# Patient Record
Sex: Male | Born: 1955 | Race: White | Hispanic: No | Marital: Married | Smoking: Never smoker
Health system: Southern US, Community
[De-identification: ages and names within clinical notes are randomized; demographics above are authoritative.]

## PROBLEM LIST (undated history)

## (undated) DIAGNOSIS — G4730 Sleep apnea, unspecified: Secondary | ICD-10-CM

## (undated) DIAGNOSIS — M21372 Foot drop, left foot: Secondary | ICD-10-CM

## (undated) DIAGNOSIS — E1142 Type 2 diabetes mellitus with diabetic polyneuropathy: Secondary | ICD-10-CM

## (undated) DIAGNOSIS — N179 Acute kidney failure, unspecified: Secondary | ICD-10-CM

## (undated) DIAGNOSIS — I679 Cerebrovascular disease, unspecified: Secondary | ICD-10-CM

## (undated) DIAGNOSIS — F32A Depression, unspecified: Secondary | ICD-10-CM

## (undated) DIAGNOSIS — G5700 Lesion of sciatic nerve, unspecified lower limb: Secondary | ICD-10-CM

## (undated) DIAGNOSIS — F329 Major depressive disorder, single episode, unspecified: Secondary | ICD-10-CM

## (undated) DIAGNOSIS — Z8719 Personal history of other diseases of the digestive system: Secondary | ICD-10-CM

## (undated) DIAGNOSIS — K219 Gastro-esophageal reflux disease without esophagitis: Secondary | ICD-10-CM

## (undated) DIAGNOSIS — R269 Unspecified abnormalities of gait and mobility: Secondary | ICD-10-CM

## (undated) DIAGNOSIS — E1110 Type 2 diabetes mellitus with ketoacidosis without coma: Secondary | ICD-10-CM

## (undated) DIAGNOSIS — G709 Myoneural disorder, unspecified: Secondary | ICD-10-CM

## (undated) HISTORY — PX: EYE SURGERY: SHX253

## (undated) HISTORY — DX: Type 2 diabetes mellitus with diabetic polyneuropathy: E11.42

## (undated) HISTORY — DX: Lesion of sciatic nerve, unspecified lower limb: G57.00

## (undated) HISTORY — DX: Unspecified abnormalities of gait and mobility: R26.9

## (undated) HISTORY — PX: FRACTURE SURGERY: SHX138

## (undated) HISTORY — DX: Cerebrovascular disease, unspecified: I67.9

## (undated) HISTORY — DX: Foot drop, left foot: M21.372

---

## 1999-08-08 ENCOUNTER — Inpatient Hospital Stay (HOSPITAL_COMMUNITY): Admission: EM | Admit: 1999-08-08 | Discharge: 1999-08-09 | Payer: Self-pay | Admitting: *Deleted

## 2004-06-15 ENCOUNTER — Emergency Department (HOSPITAL_COMMUNITY): Admission: EM | Admit: 2004-06-15 | Discharge: 2004-06-15 | Payer: Self-pay | Admitting: Emergency Medicine

## 2006-03-28 HISTORY — PX: TOE AMPUTATION: SHX809

## 2010-10-13 ENCOUNTER — Emergency Department (INDEPENDENT_AMBULATORY_CARE_PROVIDER_SITE_OTHER): Payer: BC Managed Care – PPO

## 2010-10-13 ENCOUNTER — Emergency Department (HOSPITAL_BASED_OUTPATIENT_CLINIC_OR_DEPARTMENT_OTHER)
Admission: EM | Admit: 2010-10-13 | Discharge: 2010-10-13 | Disposition: A | Discharge status: Home / Self Care | Payer: BC Managed Care – PPO | Attending: Emergency Medicine | Admitting: Emergency Medicine

## 2010-10-13 ENCOUNTER — Encounter: Payer: Self-pay | Admitting: *Deleted

## 2010-10-13 DIAGNOSIS — R7309 Other abnormal glucose: Secondary | ICD-10-CM

## 2010-10-13 DIAGNOSIS — R5381 Other malaise: Secondary | ICD-10-CM | POA: Insufficient documentation

## 2010-10-13 DIAGNOSIS — R1084 Generalized abdominal pain: Secondary | ICD-10-CM | POA: Insufficient documentation

## 2010-10-13 DIAGNOSIS — R109 Unspecified abdominal pain: Secondary | ICD-10-CM

## 2010-10-13 DIAGNOSIS — E162 Hypoglycemia, unspecified: Secondary | ICD-10-CM

## 2010-10-13 DIAGNOSIS — R10819 Abdominal tenderness, unspecified site: Secondary | ICD-10-CM | POA: Insufficient documentation

## 2010-10-13 DIAGNOSIS — R5383 Other fatigue: Secondary | ICD-10-CM

## 2010-10-13 DIAGNOSIS — IMO0001 Reserved for inherently not codable concepts without codable children: Secondary | ICD-10-CM | POA: Insufficient documentation

## 2010-10-13 DIAGNOSIS — I10 Essential (primary) hypertension: Secondary | ICD-10-CM | POA: Insufficient documentation

## 2010-10-13 DIAGNOSIS — J189 Pneumonia, unspecified organism: Secondary | ICD-10-CM

## 2010-10-13 DIAGNOSIS — E119 Type 2 diabetes mellitus without complications: Secondary | ICD-10-CM | POA: Insufficient documentation

## 2010-10-13 DIAGNOSIS — R110 Nausea: Secondary | ICD-10-CM

## 2010-10-13 DIAGNOSIS — R6883 Chills (without fever): Secondary | ICD-10-CM | POA: Insufficient documentation

## 2010-10-13 DIAGNOSIS — R630 Anorexia: Secondary | ICD-10-CM | POA: Insufficient documentation

## 2010-10-13 LAB — URINALYSIS, ROUTINE W REFLEX MICROSCOPIC
Glucose, UA: >1000 mg/dL — AB
Hgb urine dipstick: NEGATIVE
Ketones, ur: NEGATIVE mg/dL
Protein, ur: NEGATIVE mg/dL
pH: 5.5 (ref 5.0–8.0)

## 2010-10-13 LAB — DIFFERENTIAL
Basophils Absolute: 0 10*3/uL (ref 0.0–0.1)
Lymphocytes Relative: 11 % — ABNORMAL LOW (ref 12–46)
Neutro Abs: 11.5 10*3/uL — ABNORMAL HIGH (ref 1.7–7.7)
Neutrophils Relative %: 80 % — ABNORMAL HIGH (ref 43–77)

## 2010-10-13 LAB — COMPREHENSIVE METABOLIC PANEL
ALT: 17 U/L (ref 0–53)
AST: 15 U/L (ref 0–37)
Albumin: 3.7 g/dL (ref 3.5–5.2)
CO2: 27 mEq/L (ref 19–32)
Chloride: 100 mEq/L (ref 96–112)
GFR calc non Af Amer: >60 mL/min (ref >60)
Sodium: 137 mEq/L (ref 135–145)
Total Bilirubin: 0.4 mg/dL (ref 0.3–1.2)

## 2010-10-13 LAB — CBC
Platelets: 206 10*3/uL (ref 150–400)
RDW: 12 % (ref 11.5–15.5)
WBC: 14.4 10*3/uL — ABNORMAL HIGH (ref 4.0–10.5)

## 2010-10-13 LAB — URINE MICROSCOPIC-ADD ON

## 2010-10-13 LAB — GLUCOSE, CAPILLARY
Glucose-Capillary: 202 mg/dL — ABNORMAL HIGH (ref 70–99)
Glucose-Capillary: 172 mg/dL — ABNORMAL HIGH (ref 70–99)

## 2010-10-13 MED ORDER — AZITHROMYCIN 250 MG PO TABS: 250.0000 mg | ORAL_TABLET | Freq: Every day | ORAL | Status: AC

## 2010-10-13 MED ORDER — ONDANSETRON HCL 4 MG/2ML IJ SOLN
4.0000 mg | Freq: Once | INTRAMUSCULAR | Status: AC
  Administered 2010-10-13: 4 mg via INTRAVENOUS
  Filled 2010-10-13: qty 2

## 2010-10-13 MED ORDER — SODIUM CHLORIDE 0.9 % IV SOLN
999.0000 mL | Freq: Once | INTRAVENOUS | Status: AC
  Administered 2010-10-13: 999 mL via INTRAVENOUS

## 2010-10-13 MED ORDER — AZITHROMYCIN 1 G PO PACK
1.0000 g | PACK | Freq: Once | ORAL | Status: AC
  Administered 2010-10-13: 1 g via ORAL

## 2010-10-13 MED ORDER — CEFDINIR 300 MG PO CAPS: 300.0000 mg | ORAL_CAPSULE | Freq: Two times a day (BID) | ORAL | Status: AC

## 2010-10-13 MED ORDER — IOHEXOL 300 MG/ML  SOLN
100.0000 mL | Freq: Once | INTRAMUSCULAR | Status: AC | PRN
  Administered 2010-10-13: 100 mL via INTRAVENOUS

## 2010-10-13 MED ORDER — AZITHROMYCIN 1 G PO PACK
1.0000 g | PACK | Freq: Once | ORAL | Status: DC
  Filled 2010-10-13: qty 1

## 2010-10-13 MED ORDER — DEXTROSE 5 % IV SOLN: 500.0000 mg | Freq: Once | INTRAVENOUS | Status: DC

## 2010-10-13 MED ORDER — DEXTROSE 5 % IV SOLN
1.0000 g | INTRAVENOUS | Status: DC
  Administered 2010-10-13: 1 g via INTRAVENOUS
  Filled 2010-10-13: qty 1

## 2010-10-13 NOTE — ED Notes (Signed)
Pt reports that this AM pt was hyperglycemic and took his prescribed medication, pt then reports that he developed ABD pain with nausea and dizziness. Pt denies any V/D.

## 2010-10-13 NOTE — ED Notes (Signed)
Pt reports that he took 16 Units of humolog and 55 Units of Lantus for his CBG, pt was sent from PCP for evaluation of hyperglycemia and abd pain.

## 2010-10-13 NOTE — ED Notes (Signed)
Pt Transported to CT at this time. Pt states nausea has improved. IVF complete. Site SL. Dry/intact. Family at bedside.

## 2010-10-13 NOTE — ED Provider Notes (Signed)
History     Chief Complaint  Patient presents with  . Hyperglycemia  . Fatigue  . Abdominal Pain  . Nausea   HPI  Past Medical History  Diagnosis Date  . Diabetes mellitus   . Hypertension     Past Surgical History  Procedure Date  . Toe amputation     History reviewed. No pertinent family history.  History  Substance Use Topics  . Smoking status: Never Smoker   . Smokeless tobacco: Not on file  . Alcohol Use: Not on file      Review of Systems  Physical Exam  BP 131/68  Pulse 87  Temp(Src) 98.7 F (37.1 C) (Oral)  Resp 17  SpO2 99%  Physical Exam  ED Course  Procedures  MDM Pt c/o mid abd pain, constant, dull, no hx same pain, dec apppetite. No fever or chills. No prior abd surgery. Tenderness on exam without rebound or guarding. Ct pending.       Suzi Roots, MD 10/13/10 [phone]

## 2010-10-13 NOTE — ED Provider Notes (Signed)
History     Chief Complaint  Patient presents with  . Hyperglycemia  . Fatigue  . Abdominal Pain  . Nausea   Patient is a 55 y.o. male presenting with abdominal pain. The history is provided by the patient.  Abdominal Pain The primary symptoms of the illness include abdominal pain. The current episode started 13 to 24 hours ago. The onset of the illness was gradual.  The abdominal pain began 13 to24 hours ago. The pain came on gradually. The abdominal pain has been gradually worsening since its onset. The abdominal pain is generalized. The abdominal pain does not radiate. The abdominal pain is relieved by nothing.  The patient has had a change in bowel habit. Additional symptoms associated with the illness include chills and anorexia. Significant associated medical issues include diabetes.    Past Medical History  Diagnosis Date  . Diabetes mellitus   . Hypertension     Past Surgical History  Procedure Date  . Toe amputation     History reviewed. No pertinent family history.  History  Substance Use Topics  . Smoking status: Never Smoker   . Smokeless tobacco: Not on file  . Alcohol Use: Not on file      Review of Systems  Constitutional: Positive for chills.  Gastrointestinal: Positive for abdominal pain and anorexia.  Musculoskeletal: Positive for myalgias.  All other systems reviewed and are negative.    Physical Exam  BP 131/68  Pulse 87  Temp(Src) 98.7 F (37.1 C) (Oral)  Resp 17  SpO2 99%  Physical Exam  Constitutional: He appears well-developed and well-nourished.  HENT:  Head: Normocephalic.  Neck: Normal range of motion. Neck supple.  Cardiovascular: Normal rate.   Pulmonary/Chest: Effort normal and breath sounds normal.  Abdominal: Soft. There is tenderness. There is guarding.  Musculoskeletal: Normal range of motion.  Skin: Skin is warm.  Psychiatric: He has a normal mood and affect.    ED Course  Procedures  MDM Labs show elevated  WBC's.  Dr.Steinl in to see,  Ct scan of abdomen shows areas that may be pneumonia.  Radiologist advised Chest xray,  Chest xray shows pneumonia,  I will treat with Rocephin and Zithromax.  Pt to see his Md for recheck tommorow.      Langston Masker, [place] 10/13/10 2151

## 2010-12-10 ENCOUNTER — Emergency Department (HOSPITAL_BASED_OUTPATIENT_CLINIC_OR_DEPARTMENT_OTHER)
Admission: EM | Admit: 2010-12-10 | Discharge: 2010-12-10 | Discharge status: Against Medical Advice | Payer: BC Managed Care – PPO | Attending: Emergency Medicine | Admitting: Emergency Medicine

## 2010-12-10 ENCOUNTER — Encounter (HOSPITAL_BASED_OUTPATIENT_CLINIC_OR_DEPARTMENT_OTHER): Payer: Self-pay | Admitting: *Deleted

## 2010-12-10 DIAGNOSIS — L0291 Cutaneous abscess, unspecified: Secondary | ICD-10-CM | POA: Insufficient documentation

## 2010-12-10 NOTE — ED Notes (Signed)
Abscess right upper leg x 1 week. States he drained an oz of puss from it last night and woke with it red, hot to touch and painful.

## 2011-12-05 DIAGNOSIS — E669 Obesity, unspecified: Secondary | ICD-10-CM | POA: Diagnosis present

## 2012-08-18 ENCOUNTER — Emergency Department (HOSPITAL_COMMUNITY): Payer: BC Managed Care – PPO

## 2012-08-18 ENCOUNTER — Inpatient Hospital Stay (HOSPITAL_COMMUNITY): Payer: BC Managed Care – PPO

## 2012-08-18 ENCOUNTER — Inpatient Hospital Stay (HOSPITAL_COMMUNITY)
Admission: EM | Admit: 2012-08-18 | Discharge: 2012-08-29 | DRG: 566 | Disposition: A | Discharge status: Rehabilitation Facility | Payer: BC Managed Care – PPO | Attending: Internal Medicine | Admitting: Internal Medicine

## 2012-08-18 DIAGNOSIS — I10 Essential (primary) hypertension: Secondary | ICD-10-CM | POA: Diagnosis present

## 2012-08-18 DIAGNOSIS — E1169 Type 2 diabetes mellitus with other specified complication: Principal | ICD-10-CM | POA: Diagnosis present

## 2012-08-18 DIAGNOSIS — J9600 Acute respiratory failure, unspecified whether with hypoxia or hypercapnia: Secondary | ICD-10-CM | POA: Diagnosis present

## 2012-08-18 DIAGNOSIS — G9340 Encephalopathy, unspecified: Secondary | ICD-10-CM | POA: Diagnosis present

## 2012-08-18 DIAGNOSIS — R1110 Vomiting, unspecified: Secondary | ICD-10-CM | POA: Diagnosis not present

## 2012-08-18 DIAGNOSIS — K219 Gastro-esophageal reflux disease without esophagitis: Secondary | ICD-10-CM | POA: Diagnosis present

## 2012-08-18 DIAGNOSIS — Z91199 Patient's noncompliance with other medical treatment and regimen due to unspecified reason: Secondary | ICD-10-CM

## 2012-08-18 DIAGNOSIS — F329 Major depressive disorder, single episode, unspecified: Secondary | ICD-10-CM | POA: Diagnosis present

## 2012-08-18 DIAGNOSIS — E119 Type 2 diabetes mellitus without complications: Secondary | ICD-10-CM

## 2012-08-18 DIAGNOSIS — E1159 Type 2 diabetes mellitus with other circulatory complications: Secondary | ICD-10-CM | POA: Diagnosis present

## 2012-08-18 DIAGNOSIS — E871 Hypo-osmolality and hyponatremia: Secondary | ICD-10-CM | POA: Diagnosis not present

## 2012-08-18 DIAGNOSIS — I798 Other disorders of arteries, arterioles and capillaries in diseases classified elsewhere: Secondary | ICD-10-CM | POA: Diagnosis present

## 2012-08-18 DIAGNOSIS — L97509 Non-pressure chronic ulcer of other part of unspecified foot with unspecified severity: Secondary | ICD-10-CM | POA: Diagnosis present

## 2012-08-18 DIAGNOSIS — L97911 Non-pressure chronic ulcer of unspecified part of right lower leg limited to breakdown of skin: Secondary | ICD-10-CM

## 2012-08-18 DIAGNOSIS — F3289 Other specified depressive episodes: Secondary | ICD-10-CM | POA: Diagnosis present

## 2012-08-18 DIAGNOSIS — Z794 Long term (current) use of insulin: Secondary | ICD-10-CM

## 2012-08-18 DIAGNOSIS — I6381 Other cerebral infarction due to occlusion or stenosis of small artery: Secondary | ICD-10-CM | POA: Diagnosis present

## 2012-08-18 DIAGNOSIS — Z9119 Patient's noncompliance with other medical treatment and regimen: Secondary | ICD-10-CM

## 2012-08-18 DIAGNOSIS — R569 Unspecified convulsions: Secondary | ICD-10-CM | POA: Insufficient documentation

## 2012-08-18 DIAGNOSIS — E1151 Type 2 diabetes mellitus with diabetic peripheral angiopathy without gangrene: Secondary | ICD-10-CM | POA: Diagnosis present

## 2012-08-18 DIAGNOSIS — E11649 Type 2 diabetes mellitus with hypoglycemia without coma: Secondary | ICD-10-CM | POA: Diagnosis present

## 2012-08-18 DIAGNOSIS — R401 Stupor: Secondary | ICD-10-CM | POA: Insufficient documentation

## 2012-08-18 DIAGNOSIS — J9690 Respiratory failure, unspecified, unspecified whether with hypoxia or hypercapnia: Secondary | ICD-10-CM

## 2012-08-18 DIAGNOSIS — E162 Hypoglycemia, unspecified: Secondary | ICD-10-CM

## 2012-08-18 DIAGNOSIS — R404 Transient alteration of awareness: Secondary | ICD-10-CM

## 2012-08-18 DIAGNOSIS — I739 Peripheral vascular disease, unspecified: Secondary | ICD-10-CM | POA: Insufficient documentation

## 2012-08-18 DIAGNOSIS — F411 Generalized anxiety disorder: Secondary | ICD-10-CM | POA: Diagnosis present

## 2012-08-18 DIAGNOSIS — F332 Major depressive disorder, recurrent severe without psychotic features: Secondary | ICD-10-CM

## 2012-08-18 DIAGNOSIS — G589 Mononeuropathy, unspecified: Secondary | ICD-10-CM | POA: Diagnosis present

## 2012-08-18 DIAGNOSIS — E669 Obesity, unspecified: Secondary | ICD-10-CM | POA: Diagnosis present

## 2012-08-18 DIAGNOSIS — L02619 Cutaneous abscess of unspecified foot: Secondary | ICD-10-CM | POA: Diagnosis present

## 2012-08-18 DIAGNOSIS — R1310 Dysphagia, unspecified: Secondary | ICD-10-CM | POA: Diagnosis present

## 2012-08-18 DIAGNOSIS — I639 Cerebral infarction, unspecified: Secondary | ICD-10-CM

## 2012-08-18 DIAGNOSIS — R45851 Suicidal ideations: Secondary | ICD-10-CM

## 2012-08-18 DIAGNOSIS — R339 Retention of urine, unspecified: Secondary | ICD-10-CM | POA: Diagnosis not present

## 2012-08-18 DIAGNOSIS — R4182 Altered mental status, unspecified: Secondary | ICD-10-CM

## 2012-08-18 DIAGNOSIS — G4730 Sleep apnea, unspecified: Secondary | ICD-10-CM | POA: Diagnosis present

## 2012-08-18 HISTORY — DX: Personal history of other diseases of the digestive system: Z87.19

## 2012-08-18 HISTORY — DX: Major depressive disorder, single episode, unspecified: F32.9

## 2012-08-18 HISTORY — DX: Depression, unspecified: F32.A

## 2012-08-18 HISTORY — DX: Myoneural disorder, unspecified: G70.9

## 2012-08-18 HISTORY — DX: Gastro-esophageal reflux disease without esophagitis: K21.9

## 2012-08-18 HISTORY — DX: Sleep apnea, unspecified: G47.30

## 2012-08-18 LAB — COMPREHENSIVE METABOLIC PANEL
Albumin: 3.7 g/dL (ref 3.5–5.2)
BUN: 22 mg/dL (ref 6–23)
BUN: 19 mg/dL (ref 6–23)
Calcium: 9.1 mg/dL (ref 8.4–10.5)
Creatinine, Ser: 0.66 mg/dL (ref 0.50–1.35)
Creatinine, Ser: 0.62 mg/dL (ref 0.50–1.35)
GFR calc Af Amer: >90 mL/min (ref >90)
Glucose, Bld: 128 mg/dL — ABNORMAL HIGH (ref 70–99)
Potassium: 6.2 mEq/L — ABNORMAL HIGH (ref 3.5–5.1)
Total Protein: 7.8 g/dL (ref 6.0–8.3)
Total Protein: 6.3 g/dL (ref 6.0–8.3)

## 2012-08-18 LAB — CBC WITH DIFFERENTIAL/PLATELET
Basophils Relative: 0 % (ref 0–1)
Eosinophils Absolute: 0 10*3/uL (ref 0.0–0.7)
Hemoglobin: 13.5 g/dL (ref 13.0–17.0)
MCH: 32.6 pg (ref 26.0–34.0)
MCHC: 34.2 g/dL (ref 30.0–36.0)
Monocytes Relative: 8 % (ref 3–12)
Neutrophils Relative %: 90 % — ABNORMAL HIGH (ref 43–77)

## 2012-08-18 LAB — GLUCOSE, CAPILLARY
Glucose-Capillary: 136 mg/dL — ABNORMAL HIGH (ref 70–99)
Glucose-Capillary: <10 mg/dL — CL (ref 70–99)
Glucose-Capillary: 68 mg/dL — ABNORMAL LOW (ref 70–99)
Glucose-Capillary: 67 mg/dL — ABNORMAL LOW (ref 70–99)
Glucose-Capillary: 70 mg/dL (ref 70–99)
Glucose-Capillary: 92 mg/dL (ref 70–99)
Glucose-Capillary: 80 mg/dL (ref 70–99)
Glucose-Capillary: 76 mg/dL (ref 70–99)

## 2012-08-18 LAB — TROPONIN I: Troponin I: <0.3 ng/mL (ref <0.30)

## 2012-08-18 LAB — URINALYSIS, ROUTINE W REFLEX MICROSCOPIC
Leukocytes, UA: NEGATIVE
Nitrite: NEGATIVE
Specific Gravity, Urine: 1.012 (ref 1.005–1.030)
pH: 6.5 (ref 5.0–8.0)

## 2012-08-18 LAB — STREP PNEUMONIAE URINARY ANTIGEN: Strep Pneumo Urinary Antigen: NEGATIVE

## 2012-08-18 LAB — POCT I-STAT 3, ART BLOOD GAS (G3+)
Bicarbonate: 25 mEq/L — ABNORMAL HIGH (ref 20.0–24.0)
O2 Saturation: 96 %
Patient temperature: 98.7
TCO2: 27 mmol/L (ref 0–100)
pH, Arterial: 7.147 — CL (ref 7.350–7.450)
pO2, Arterial: 139 mmHg — ABNORMAL HIGH (ref 80.0–100.0)
pO2, Arterial: 87 mmHg (ref 80.0–100.0)

## 2012-08-18 LAB — ETHANOL: Alcohol, Ethyl (B): <11 mg/dL (ref 0–11)

## 2012-08-18 LAB — ACETAMINOPHEN LEVEL: Acetaminophen (Tylenol), Serum: <15 ug/mL (ref 10–30)

## 2012-08-18 LAB — LACTIC ACID, PLASMA: Lactic Acid, Venous: 1 mmol/L (ref 0.5–2.2)

## 2012-08-18 LAB — MRSA PCR SCREENING: MRSA by PCR: NEGATIVE

## 2012-08-18 LAB — URINE MICROSCOPIC-ADD ON

## 2012-08-18 LAB — PROCALCITONIN: Procalcitonin: <0.1 ng/mL

## 2012-08-18 LAB — SALICYLATE LEVEL: Salicylate Lvl: <2 mg/dL — ABNORMAL LOW (ref 2.8–20.0)

## 2012-08-18 LAB — PROTIME-INR
Prothrombin Time: 11.8 seconds (ref 11.6–15.2)
Prothrombin Time: 12 seconds (ref 11.6–15.2)

## 2012-08-18 LAB — CBC
MCH: 32.1 pg (ref 26.0–34.0)
MCHC: 34.7 g/dL (ref 30.0–36.0)
Platelets: 227 10*3/uL (ref 150–400)

## 2012-08-18 LAB — MAGNESIUM: Magnesium: 1.8 mg/dL (ref 1.5–2.5)

## 2012-08-18 MED ORDER — CEFAZOLIN SODIUM 1-5 GM-% IV SOLN
1.0000 g | Freq: Three times a day (TID) | INTRAVENOUS | Status: DC
  Administered 2012-08-18: 1 g via INTRAVENOUS
  Filled 2012-08-18 (×3): qty 50

## 2012-08-18 MED ORDER — LORAZEPAM 2 MG/ML IJ SOLN
1.0000 mg | Freq: Once | INTRAMUSCULAR | Status: AC
  Administered 2012-08-18: 1 mg via INTRAVENOUS

## 2012-08-18 MED ORDER — FENTANYL CITRATE 0.05 MG/ML IJ SOLN
100.0000 ug | Freq: Once | INTRAMUSCULAR | Status: AC
  Administered 2012-08-18: 100 ug via INTRAVENOUS

## 2012-08-18 MED ORDER — SODIUM CHLORIDE 0.9 % IV SOLN
40.0000 ug/h | INTRAVENOUS | Status: DC
  Filled 2012-08-18: qty 50

## 2012-08-18 MED ORDER — ROCURONIUM BROMIDE 50 MG/5ML IV SOLN
INTRAVENOUS | Status: AC
  Filled 2012-08-18: qty 2

## 2012-08-18 MED ORDER — SODIUM PHOSPHATE 3 MMOLE/ML IV SOLN
10.0000 mmol | Freq: Once | INTRAVENOUS | Status: DC
  Filled 2012-08-18: qty 3.33

## 2012-08-18 MED ORDER — CHLORHEXIDINE GLUCONATE 0.12 % MT SOLN
15.0000 mL | Freq: Two times a day (BID) | OROMUCOSAL | Status: DC
  Administered 2012-08-18 – 2012-08-21 (×6): 15 mL via OROMUCOSAL
  Filled 2012-08-18 (×6): qty 15

## 2012-08-18 MED ORDER — LIDOCAINE HCL (CARDIAC) 20 MG/ML IV SOLN
INTRAVENOUS | Status: AC
  Filled 2012-08-18: qty 5

## 2012-08-18 MED ORDER — PANTOPRAZOLE SODIUM 40 MG IV SOLR
40.0000 mg | Freq: Every day | INTRAVENOUS | Status: DC
  Administered 2012-08-18 – 2012-08-24 (×7): 40 mg via INTRAVENOUS
  Filled 2012-08-18 (×10): qty 40

## 2012-08-18 MED ORDER — DEXTROSE 50 % IV SOLN
INTRAVENOUS | Status: AC
  Administered 2012-08-18: 50 mL
  Filled 2012-08-18: qty 50

## 2012-08-18 MED ORDER — MIDAZOLAM HCL 2 MG/2ML IJ SOLN
5.0000 mg | Freq: Once | INTRAMUSCULAR | Status: AC
  Administered 2012-08-18: 5 mg via INTRAVENOUS

## 2012-08-18 MED ORDER — SUCCINYLCHOLINE CHLORIDE 20 MG/ML IJ SOLN
INTRAMUSCULAR | Status: AC
  Administered 2012-08-18: 100 mg
  Filled 2012-08-18: qty 1

## 2012-08-18 MED ORDER — SODIUM CHLORIDE 0.9 % IJ SOLN
10.0000 mL | Freq: Two times a day (BID) | INTRAMUSCULAR | Status: DC
  Administered 2012-08-19 – 2012-08-21 (×5): 10 mL
  Administered 2012-08-22: 20 mL

## 2012-08-18 MED ORDER — BIOTENE DRY MOUTH MT LIQD
15.0000 mL | Freq: Four times a day (QID) | OROMUCOSAL | Status: DC
  Administered 2012-08-19 – 2012-08-21 (×12): 15 mL via OROMUCOSAL

## 2012-08-18 MED ORDER — DEXTROSE 10 % IV SOLN
INTRAVENOUS | Status: DC
  Administered 2012-08-18 – 2012-08-19 (×5): via INTRAVENOUS

## 2012-08-18 MED ORDER — DEXTROSE 50 % IV SOLN
50.0000 mL | INTRAVENOUS | Status: DC | PRN
  Administered 2012-08-18 – 2012-08-19 (×4): 50 mL via INTRAVENOUS
  Filled 2012-08-18 (×4): qty 50

## 2012-08-18 MED ORDER — FENTANYL CITRATE 0.05 MG/ML IJ SOLN
INTRAMUSCULAR | Status: AC
  Filled 2012-08-18: qty 2

## 2012-08-18 MED ORDER — PROPOFOL 10 MG/ML IV EMUL
5.0000 ug/kg/min | INTRAVENOUS | Status: DC
  Administered 2012-08-18: 10 ug/kg/min via INTRAVENOUS
  Administered 2012-08-19: 15 ug/kg/min via INTRAVENOUS
  Administered 2012-08-19: 20 ug/kg/min via INTRAVENOUS
  Filled 2012-08-18 (×3): qty 100

## 2012-08-18 MED ORDER — SODIUM CHLORIDE 0.9 % IJ SOLN
10.0000 mL | INTRAMUSCULAR | Status: DC | PRN
  Administered 2012-08-24: 10 mL

## 2012-08-18 MED ORDER — MIDAZOLAM HCL 2 MG/2ML IJ SOLN
INTRAMUSCULAR | Status: AC
  Administered 2012-08-18: 5 mg via INTRAVENOUS
  Filled 2012-08-18: qty 6

## 2012-08-18 MED ORDER — ETOMIDATE 2 MG/ML IV SOLN
INTRAVENOUS | Status: AC
  Administered 2012-08-18: 20 mg
  Filled 2012-08-18: qty 20

## 2012-08-18 MED ORDER — MIDAZOLAM HCL 2 MG/2ML IJ SOLN
INTRAMUSCULAR | Status: AC
  Filled 2012-08-18: qty 6

## 2012-08-18 MED ORDER — HEPARIN SODIUM (PORCINE) 5000 UNIT/ML IJ SOLN
5000.0000 [IU] | Freq: Three times a day (TID) | INTRAMUSCULAR | Status: DC
  Administered 2012-08-18 – 2012-08-29 (×33): 5000 [IU] via SUBCUTANEOUS
  Filled 2012-08-18 (×36): qty 1

## 2012-08-18 MED ORDER — PROPOFOL 10 MG/ML IV EMUL
INTRAVENOUS | Status: AC
  Filled 2012-08-18: qty 100

## 2012-08-18 MED ORDER — PROPOFOL 10 MG/ML IV EMUL
5.0000 ug/kg/min | INTRAVENOUS | Status: DC
  Administered 2012-08-18: 40 ug/kg/min via INTRAVENOUS

## 2012-08-18 MED ORDER — PIPERACILLIN-TAZOBACTAM 3.375 G IVPB
3.3750 g | Freq: Three times a day (TID) | INTRAVENOUS | Status: DC
  Administered 2012-08-18 – 2012-08-21 (×9): 3.375 g via INTRAVENOUS
  Filled 2012-08-18 (×10): qty 50

## 2012-08-18 MED ORDER — SODIUM GLYCEROPHOSPHATE 1 MMOLE/ML IV SOLN
10.0000 mmol | Freq: Once | INTRAVENOUS | Status: AC
  Administered 2012-08-18: 10 mmol via INTRAVENOUS
  Filled 2012-08-18: qty 10

## 2012-08-18 MED ORDER — DEXTROSE 50 % IV SOLN
INTRAVENOUS | Status: AC
  Administered 2012-08-18: 16:00:00
  Filled 2012-08-18: qty 50

## 2012-08-18 NOTE — Progress Notes (Signed)
1. CT head with 2 areas concerning for R thalamus infarcts, age indeterminate;   Neurology called to evaluate.   2. Antibiotics changed to Zosyn for aspiration PNA coverage   3. DVT prophylaxis with Heparin ordered.

## 2012-08-18 NOTE — ED Notes (Signed)
Temporary name deleted from pyxis per pharmacy. Ativan 1 mg wasted in sink with Diona Foley, RN

## 2012-08-18 NOTE — H&P (Signed)
PULMONARY  / CRITICAL CARE MEDICINE  Name: GEAN LAROSE MRN: [id number] DOB: January 02, 1956    ADMISSION DATE:  08/18/2012    REFERRING MD : EDP PRIMARY SERVICE: PCCM  CHIEF COMPLAINT:  VDRF  BRIEF PATIENT DESCRIPTION:  57 yo Wm found unresponsive in his care. Found to be profoundly hypoglycemic and unresponsive. NT intubation in the field and D50 boluses administered and transported to [facility] ED. There is a  Question of seizure like activity and CT of the head is planned. ET placed by EDP. Hypoglycemia has been treated.   He has a Hx of HTN and PVD secondary to DM. He has a space boot on rt ankle and a rt foot silver dollar wound has purulent drainage and has obviously been treated by [facility] type facility. PCCM has been asked to admit. We will check CT head and may call Neuro consult if tonic clonic activity continues once metabolic disarray has been corrected.  SIGNIFICANT EVENTS / STUDIES:  5-24 hypogl  LINES / TUBES: 5-24 OTT(EDP)>> 5-24 Lt i j cvl>>  CULTURES: 5-24 rt foot>>  ANTIBIOTICS: 5-24 ancef>>  HISTORY OF PRESENT ILLNESS:  57 yo Wm found unresponsive in his care. Found to be profoundly hypoglycemic and unresponsive. NT intubation in the field and D50 boluses administered and transported to [facility] ED. There is a  Question of seizure like activity and CT of the head is planned. ET placed by EDP. Hypoglycemia has been treated.   He has a Hx of HTN and PVD secondary to DM. He has a space boot on rt ankle and a rt foot silver dollar wound has purulent drainage and has obviously been treated by [facility] type facility. PCCM has been asked to admit. We will check CT head and may call Neuro consult if tonic clonic activity continues once metabolic disarray has been corrected.  PAST MEDICAL HISTORY :  Past Medical History  Diagnosis Date  . Diabetes mellitus   . Hypertension    Past Surgical History  Procedure Laterality Date  . Toe amputation     Prior to Admission medications    Medication Sig Start Date End Date Taking? Authorizing Provider  cetirizine (ZYRTEC) 10 MG chewable tablet Chew 10 mg by mouth daily.      Historical Provider, MD  clotrimazole-betamethasone (LOTRISONE) cream Apply topically 2 (two) times daily.     Historical Provider, MD  Ferrous Sulfate (IRON) 325 (65 FE) MG TABS Take 1 tablet by mouth daily.     Historical Provider, MD  guaiFENesin (MUCINEX) 600 MG 12 hr tablet Take 600 mg by mouth 2 (two) times daily.      Historical Provider, MD  insulin glargine (LANTUS) 100 UNIT/ML injection Inject 55 Units into the skin daily.     Historical Provider, MD  insulin lispro (HUMALOG) 100 UNIT/ML injection Inject 8-18 Units into the skin 3 (three) times daily before meals. Use sliding scale provided by doctor    Historical Provider, MD  lisinopril (PRINIVIL,ZESTRIL) 10 MG tablet Take 5 mg by mouth daily.     Historical Provider, MD  Multiple Vitamins-Iron (MULTIVITAMIN/IRON) TABS Take 1 tablet by mouth daily.      Historical Provider, MD  mupirocin (BACTROBAN) 2 % cream Apply topically 2 (two) times daily.     Historical Provider, MD  omeprazole (PRILOSEC) 20 MG capsule Take 20 mg by mouth daily.      Historical Provider, MD  pramlintide (SYMLIN) 1000 MCG/ML injection Inject 120 mcg into the skin 3 (three) times daily  before meals.      Historical Provider, MD  Probiotic Product (ALIGN) 4 MG CAPS Take 1 capsule by mouth daily.     Historical Provider, MD   Allergies  Allergen Reactions  . Latex Swelling  . Sulfa Antibiotics Hives    FAMILY HISTORY:  No family history on file. SOCIAL HISTORY:  reports that he has never smoked. He does not have any smokeless tobacco history on file. He reports that he does not use illicit drugs. His alcohol history is not on file.  REVIEW OF SYSTEMS:  na  SUBJECTIVE:  Pt unresponsive on vent VITAL SIGNS: Pulse Rate:  [110-118] 110 (05/24 1030) Resp:  [20-26] 26 (05/24 1030) BP: (173-221)/(86-127) 221/86 mmHg  (05/24 1030) SpO2:  [99 %-100 %] 100 % (05/24 1030) FiO2 (%):  [100 %] 100 % (05/24 1035) HEMODYNAMICS:   VENTILATOR SETTINGS: Vent Mode:  [-] PRVC FiO2 (%):  [100 %] 100 % Set Rate:  [14 bmp-24 bmp] 24 bmp Vt Set:  [650 mL] 650 mL PEEP:  [5 cmH20] 5 cmH20 INTAKE / OUTPUT: Intake/Output   None     PHYSICAL EXAMINATION: General: sedated on diprivan. Obese. Neuro:  Rt arm tonic type movement when not sedated HEENT:  Ott-> vent Cardiovascular: hsr rrr Lungs:  Coarse rhonchi bilat Abdomen:  Obese +bs Musculoskeletal:  Multiple missing toes. Rt foot ulcer with purulent drainage Skin:  warm  LABS:  Recent Labs Lab 08/18/12 1047  PHART 7.147*  PCO2ART 72.4*  PO2ART 139.0*   No results found for this basename: GLUCAP,  in the last 168 hours  Imaging: Dg Chest Port 1 View  08/18/2012   *RADIOLOGY REPORT*  Clinical Data: Status post intubation.  PORTABLE CHEST - 1 VIEW  Comparison: 10/14/2010  Findings: Heart size is accentuated by the portable AP supine technique.  Endotracheal tube is in place with tip estimated to be approximately 6.7 cm above carina.  Lungs are clear.  IMPRESSION:  1.  Interval placement of endotracheal tube, approximately 6.7 cm above carina. 2.  Lungs are clear.   Original Report Authenticated By: Norva Pavlov, M.D.     ASSESSMENT / PLAN:  PULMONARY A:VDRF secondary to ams and hypoglycemia.  P:   -Wean when stable  CARDIOVASCULAR A: HTN P:  Check CE for completeness Treat HTN as needed  RENAL Lab Results  Component Value Date   CREATININE 1.10 10/13/2010    A:  No acute issue P:     GASTROINTESTINAL A:  GI Protection P:   PPI Tube feeds within 24 hours  HEMATOLOGIC A:  No acute issue P:  Check INR for completeness  INFECTIOUS A:  Cellulitis rt lower foot P:   Culture foot Abx as needed Change foot dressing  ENDOCRINE A:  Hypoglycemia(? Insulin OD)) DM  P:   Hourly CBG till glucose >100 x 6 hours D50 as needed D10  at 50 till glucose stablized  NEUROLOGIC A:  Ams, ? Seizure, hypolycemic  P:   Correct metabolic disorder Ct head ?MRI  TODAY'S SUMMARY:  57 yo Wm found unresponsive in his care. Found to be profoundly hypoglycemic and unresponsive. NT intubation in the field and D50 boluses administered and transported to [facility] ED. There is a  Question of seizure like activity and CT of the head is planned. ET placed by EDP. Hypoglycemia has been treated.   He has a Hx of HTN and PVD secondary to DM. He has a space boot on rt ankle and a rt foot silver  dollar wound has purulent drainage and has obviously been treated by [facility] type facility. PCCM has been asked to admit. We will check CT head and may call Neuro consult if tonic clonic activity continues once metabolic disarray has been corrected.   Dorcas Carrow Beeper  [phone]  Cell  [phone]  If no response or cell goes to voicemail, call beeper [phone]   08/18/2012, 11:21 AM

## 2012-08-18 NOTE — ED Notes (Signed)
Pt presents to ED via EMS. Pt was found in car unresponsive, vomit in car, agonal respirations per EMS. EMS gave 4mg  narcan, D10 X 2, intubated via right nare, OGT placed with coffee ground emesis noted. Marland Kitchen Posturing noted with EMS and on arrival to ED.  No response noted with narcan.

## 2012-08-18 NOTE — Progress Notes (Signed)
Utilization review completed.  P.J. Roshad Hack,RN,BSN Case Manager [phone]  

## 2012-08-18 NOTE — Consult Note (Signed)
NEURO HOSPITALIST CONSULT NOTE    Reason for Consult: altered mental status, abnormal CT brain.  HPI:                                                                                                                                          Thomas Frye is an 57 y.o. male, left handed, with a past medical history significant for hypertension, DM, brought to [facility] after being found unresponsive and profoundly hypoglycemic by medics in parked car. Unknown last time seen normal. He was intubated in the field, received D50% boluses, and was transferred to [facility]. There is also report of possible seizure activity. CT brain revealed areas of low attenuation right thalamus and left parietal area. He is persistently hypoglycemic despite treatment. Currently intubated on the vent, on propofol.    Past Medical History  Diagnosis Date  . Diabetes mellitus   . Hypertension     Past Surgical History  Procedure Laterality Date  . Toe amputation      No family history on file.   Social History:  reports that he has never smoked. He does not have any smokeless tobacco history on file. He reports that he does not use illicit drugs. His alcohol history is not on file.  Allergies  Allergen Reactions  . Latex Swelling  . Sulfa Antibiotics Hives    MEDICATIONS:                                                                                                                     I have reviewed the patient's current medications.   ROS:  Unable to obtain due to patient's mental status.  History obtained from chart review and family.   Physical exam: intubated on the vent, restless despite propofol. Blood pressure 116/51, pulse 98, temperature 97.9 F (36.6 C), temperature source Other (Comment), resp. rate 22, height 6\' 4"  (1.93 m), weight 131 kg  (288 lb 12.8 oz), SpO2 100.00%.  Head: normocephalic. Neck: supple, no bruits, no JVD. Cardiac: no murmurs. Lungs: clear. Abdomen: soft, no tender, no mass. Extremities: big right toe amputated.    Neurologic Examination:                                                                                                      Propofol turned off for this exam. Mental Status: comatose.  Cranial Nerves: Pupils 2-3 mm bilaterally, poorly reactive. No gaze preference. Doesn't blink to threat. EOM appear to be present. No face asymmetry. Tongue: intubated. Motor: Moves upper extremities symmetrically, perhaps right greater than left. Sensory: doesn't react to pain. Deep Tendon Reflexes: 1 + throughout Plantars: Right: amputated big toe    Left: downgoing Cerebellar: Unable to test Gait:  Unable to test. CV: pulses palpable throughout    No results found for this basename: cbc, bmp, coags, chol, tri, ldl, hga1c    Results for orders placed during the hospital encounter of 08/18/12 (from the past 48 hour(s))  GLUCOSE, CAPILLARY     Status: None   Collection Time    08/18/12 10:15 AM      Result Value Range   Glucose-Capillary 80  70 - 99 mg/dL  CBC WITH DIFFERENTIAL     Status: Abnormal   Collection Time    08/18/12 10:24 AM      Result Value Range   WBC 20.9 (*) 4.0 - 10.5 K/uL   RBC 4.14 (*) 4.22 - 5.81 MIL/uL   Hemoglobin 13.5  13.0 - 17.0 g/dL   HCT 16.1  09.6 - 04.5 %   MCV 95.4  78.0 - 100.0 fL   MCH 32.6  26.0 - 34.0 pg   MCHC 34.2  30.0 - 36.0 g/dL   RDW 40.9  81.1 - 91.4 %   Platelets 260  150 - 400 K/uL   Neutrophils Relative % 90 (*) 43 - 77 %   Neutro Abs 18.8 (*) 1.7 - 7.7 K/uL   Lymphocytes Relative 2 (*) 12 - 46 %   Lymphs Abs 0.5 (*) 0.7 - 4.0 K/uL   Monocytes Relative 8  3 - 12 %   Monocytes Absolute 1.6 (*) 0.1 - 1.0 K/uL   Eosinophils Relative 0  0 - 5 %   Eosinophils Absolute 0.0  0.0 - 0.7 K/uL   Basophils Relative 0  0 - 1 %   Basophils Absolute  0.0  0.0 - 0.1 K/uL  COMPREHENSIVE METABOLIC PANEL     Status: Abnormal   Collection Time    08/18/12 10:24 AM      Result Value Range   Sodium 134 (*) 135 - 145 mEq/L   Potassium 6.2 (*) 3.5 - 5.1 mEq/L   Chloride 97  96 - 112 mEq/L   CO2 26  19 - 32 mEq/L   Glucose, Bld 142 (*) 70 - 99 mg/dL   BUN 22  6 - 23 mg/dL   Creatinine, Ser 1.61  0.50 - 1.35 mg/dL   Calcium 9.7  8.4 - 09.6 mg/dL   Total Protein 7.8  6.0 - 8.3 g/dL   Albumin 3.7  3.5 - 5.2 g/dL   AST 36  0 - 37 U/L   ALT 19  0 - 53 U/L   Alkaline Phosphatase 68  39 - 117 U/L   Total Bilirubin 0.3  0.3 - 1.2 mg/dL   GFR calc non Af Amer >90  >90 mL/min   GFR calc Af Amer >90  >90 mL/min   Comment:            The eGFR has been calculated     using the CKD EPI equation.     This calculation has not been     validated in all clinical     situations.     eGFR's persistently     <90 mL/min signify     possible Chronic Kidney Disease.  ACETAMINOPHEN LEVEL     Status: None   Collection Time    08/18/12 10:24 AM      Result Value Range   Acetaminophen (Tylenol), Serum <15.0  10 - 30 ug/mL   Comment:            THERAPEUTIC CONCENTRATIONS VARY     SIGNIFICANTLY. A RANGE OF 10-30     ug/mL MAY BE AN EFFECTIVE     CONCENTRATION FOR MANY PATIENTS.     HOWEVER, SOME ARE BEST TREATED     AT CONCENTRATIONS OUTSIDE THIS     RANGE.     ACETAMINOPHEN CONCENTRATIONS     >150 ug/mL AT 4 HOURS AFTER     INGESTION AND >50 ug/mL AT 12     HOURS AFTER INGESTION ARE     OFTEN ASSOCIATED WITH TOXIC     REACTIONS.  SALICYLATE LEVEL     Status: Abnormal   Collection Time    08/18/12 10:24 AM      Result Value Range   Salicylate Lvl <2.0 (*) 2.8 - 20.0 mg/dL  ETHANOL     Status: None   Collection Time    08/18/12 10:24 AM      Result Value Range   Alcohol, Ethyl (B) <11  0 - 11 mg/dL   Comment:            LOWEST DETECTABLE LIMIT FOR     SERUM ALCOHOL IS 11 mg/dL     FOR MEDICAL PURPOSES ONLY  TROPONIN I     Status: None    Collection Time    08/18/12 10:36 AM      Result Value Range   Troponin I <0.30  <0.30 ng/mL   Comment:            Due to the release kinetics of cTnI,     a negative result within the first hours     of the onset of symptoms does not rule out     myocardial infarction with certainty.     If myocardial infarction is still suspected,     repeat the test at appropriate intervals.  AMMONIA     Status: None   Collection Time    08/18/12 10:37 AM      Result Value Range   Ammonia 36  11 - 60 umol/L  POCT I-STAT 3, BLOOD GAS (  G3+)     Status: Abnormal   Collection Time    08/18/12 10:47 AM      Result Value Range   pH, Arterial 7.147 (*) 7.350 - 7.450   pCO2 arterial 72.4 (*) 35.0 - 45.0 mmHg   pO2, Arterial 139.0 (*) 80.0 - 100.0 mmHg   Bicarbonate 25.0 (*) 20.0 - 24.0 mEq/L   TCO2 27  0 - 100 mmol/L   O2 Saturation 98.0     Acid-base deficit 5.0 (*) 0.0 - 2.0 mmol/L   Collection site RADIAL, ALLEN'S TEST ACCEPTABLE     Drawn by RT     Sample type ARTERIAL     Comment NOTIFIED PHYSICIAN    GLUCOSE, CAPILLARY     Status: Abnormal   Collection Time    08/18/12 10:52 AM      Result Value Range   Glucose-Capillary 69 (*) 70 - 99 mg/dL  PROTIME-INR     Status: None   Collection Time    08/18/12 10:59 AM      Result Value Range   Prothrombin Time 11.8  11.6 - 15.2 seconds   INR 0.87  0.00 - 1.49  URINALYSIS, ROUTINE W REFLEX MICROSCOPIC     Status: Abnormal   Collection Time    08/18/12 11:11 AM      Result Value Range   Color, Urine YELLOW  YELLOW   APPearance CLEAR  CLEAR   Specific Gravity, Urine 1.012  1.005 - 1.030   pH 6.5  5.0 - 8.0   Glucose, UA 100 (*) NEGATIVE mg/dL   Hgb urine dipstick TRACE (*) NEGATIVE   Bilirubin Urine NEGATIVE  NEGATIVE   Ketones, ur NEGATIVE  NEGATIVE mg/dL   Protein, ur NEGATIVE  NEGATIVE mg/dL   Urobilinogen, UA 0.2  0.0 - 1.0 mg/dL   Nitrite NEGATIVE  NEGATIVE   Leukocytes, UA NEGATIVE  NEGATIVE  URINE MICROSCOPIC-ADD ON     Status:  Abnormal   Collection Time    08/18/12 11:11 AM      Result Value Range   Squamous Epithelial / LPF RARE  RARE   RBC / HPF 0-2  <3 RBC/hpf   Casts HYALINE CASTS (*) NEGATIVE  URINE RAPID DRUG SCREEN (HOSP PERFORMED)     Status: None   Collection Time    08/18/12 11:12 AM      Result Value Range   Opiates NONE DETECTED  NONE DETECTED   Cocaine NONE DETECTED  NONE DETECTED   Benzodiazepines NONE DETECTED  NONE DETECTED   Amphetamines NONE DETECTED  NONE DETECTED   Tetrahydrocannabinol NONE DETECTED  NONE DETECTED   Barbiturates NONE DETECTED  NONE DETECTED   Comment:            DRUG SCREEN FOR MEDICAL PURPOSES     ONLY.  IF CONFIRMATION IS NEEDED     FOR ANY PURPOSE, NOTIFY LAB     WITHIN 5 DAYS.                LOWEST DETECTABLE LIMITS     FOR URINE DRUG SCREEN     Drug Class       Cutoff (ng/mL)     Amphetamine      1000     Barbiturate      200     Benzodiazepine   200     Tricyclics       300     Opiates          300  Cocaine          300     THC              50  LACTIC ACID, PLASMA     Status: None   Collection Time    08/18/12 12:06 PM      Result Value Range   Lactic Acid, Venous 1.0  0.5 - 2.2 mmol/L  PROCALCITONIN     Status: None   Collection Time    08/18/12 12:06 PM      Result Value Range   Procalcitonin <0.10     Comment:            Interpretation:     PCT (Procalcitonin) <= 0.5 ng/mL:     Systemic infection (sepsis) is not likely.     Local bacterial infection is possible.     (NOTE)             ICU PCT Algorithm               Non ICU PCT Algorithm        ----------------------------     ------------------------------             PCT < 0.25 ng/mL                 PCT < 0.1 ng/mL         Stopping of antibiotics            Stopping of antibiotics           strongly encouraged.               strongly encouraged.        ----------------------------     ------------------------------           PCT level decrease by               PCT < 0.25 ng/mL            >= 80% from peak PCT           OR PCT 0.25 - 0.5 ng/mL          Stopping of antibiotics                                                 encouraged.         Stopping of antibiotics               encouraged.        ----------------------------     ------------------------------           PCT level decrease by              PCT >= 0.25 ng/mL           < 80% from peak PCT            AND PCT >= 0.5 ng/mL            Continuing antibiotics                                                  encouraged.           Continuing antibiotics  encouraged.        ----------------------------     ------------------------------         PCT level increase compared          PCT > 0.5 ng/mL             with peak PCT AND              PCT >= 0.5 ng/mL             Escalation of antibiotics                                              strongly encouraged.          Escalation of antibiotics            strongly encouraged.  POCT I-STAT 3, BLOOD GAS (G3+)     Status: Abnormal   Collection Time    08/18/12 12:27 PM      Result Value Range   pH, Arterial 7.379  7.350 - 7.450   pCO2 arterial 43.7  35.0 - 45.0 mmHg   pO2, Arterial 87.0  80.0 - 100.0 mmHg   Bicarbonate 25.8 (*) 20.0 - 24.0 mEq/L   TCO2 27  0 - 100 mmol/L   O2 Saturation 96.0     Patient temperature 98.7 F     Collection site RADIAL, ALLEN'S TEST ACCEPTABLE     Drawn by Operator     Sample type ARTERIAL    GLUCOSE, CAPILLARY     Status: Abnormal   Collection Time    08/18/12  1:48 PM      Result Value Range   Glucose-Capillary <10 (*) 70 - 99 mg/dL  GLUCOSE, CAPILLARY     Status: Abnormal   Collection Time    08/18/12  1:50 PM      Result Value Range   Glucose-Capillary <10 (*) 70 - 99 mg/dL  GLUCOSE, CAPILLARY     Status: Abnormal   Collection Time    08/18/12  1:56 PM      Result Value Range   Glucose-Capillary 136 (*) 70 - 99 mg/dL  COMPREHENSIVE METABOLIC PANEL     Status: Abnormal   Collection Time    08/18/12  2:00  PM      Result Value Range   Sodium 134 (*) 135 - 145 mEq/L   Potassium 3.3 (*) 3.5 - 5.1 mEq/L   Chloride 100  96 - 112 mEq/L   CO2 26  19 - 32 mEq/L   Glucose, Bld 128 (*) 70 - 99 mg/dL   BUN 19  6 - 23 mg/dL   Creatinine, Ser 4.54  0.50 - 1.35 mg/dL   Calcium 9.1  8.4 - 09.8 mg/dL   Total Protein 6.3  6.0 - 8.3 g/dL   Albumin 3.1 (*) 3.5 - 5.2 g/dL   AST 44 (*) 0 - 37 U/L   ALT 17  0 - 53 U/L   Alkaline Phosphatase 54  39 - 117 U/L   Total Bilirubin 0.4  0.3 - 1.2 mg/dL   GFR calc non Af Amer >90  >90 mL/min   GFR calc Af Amer >90  >90 mL/min   Comment:            The eGFR has been calculated     using the CKD EPI equation.  This calculation has not been     validated in all clinical     situations.     eGFR's persistently     <90 mL/min signify     possible Chronic Kidney Disease.  MAGNESIUM     Status: None   Collection Time    08/18/12  2:00 PM      Result Value Range   Magnesium 1.8  1.5 - 2.5 mg/dL  PHOSPHORUS     Status: Abnormal   Collection Time    08/18/12  2:00 PM      Result Value Range   Phosphorus 0.7 (*) 2.3 - 4.6 mg/dL   Comment: CRITICAL RESULT CALLED TO, READ BACK BY AND VERIFIED WITH:     MACAFERO,A RN 08/18/12 1505 WOOTEN,K  CBC     Status: Abnormal   Collection Time    08/18/12  2:00 PM      Result Value Range   WBC 15.5 (*) 4.0 - 10.5 K/uL   RBC 3.61 (*) 4.22 - 5.81 MIL/uL   Hemoglobin 11.6 (*) 13.0 - 17.0 g/dL   HCT 82.9 (*) 56.2 - 13.0 %   MCV 92.5  78.0 - 100.0 fL   MCH 32.1  26.0 - 34.0 pg   MCHC 34.7  30.0 - 36.0 g/dL   RDW 86.5  78.4 - 69.6 %   Platelets 227  150 - 400 K/uL  PROTIME-INR     Status: None   Collection Time    08/18/12  2:00 PM      Result Value Range   Prothrombin Time 12.0  11.6 - 15.2 seconds   INR 0.89  0.00 - 1.49  APTT     Status: None   Collection Time    08/18/12  2:00 PM      Result Value Range   aPTT 29  24 - 37 seconds  MRSA PCR SCREENING     Status: None   Collection Time    08/18/12  2:08 PM       Result Value Range   MRSA by PCR NEGATIVE  NEGATIVE   Comment:            The GeneXpert MRSA Assay (FDA     approved for NASAL specimens     only), is one component of a     comprehensive MRSA colonization     surveillance program. It is not     intended to diagnose MRSA     infection nor to guide or     monitor treatment for     MRSA infections.  GLUCOSE, CAPILLARY     Status: Abnormal   Collection Time    08/18/12  2:21 PM      Result Value Range   Glucose-Capillary 55 (*) 70 - 99 mg/dL  GLUCOSE, CAPILLARY     Status: Abnormal   Collection Time    08/18/12  2:49 PM      Result Value Range   Glucose-Capillary 104 (*) 70 - 99 mg/dL  GLUCOSE, CAPILLARY     Status: Abnormal   Collection Time    08/18/12  3:11 PM      Result Value Range   Glucose-Capillary 68 (*) 70 - 99 mg/dL  GLUCOSE, CAPILLARY     Status: Abnormal   Collection Time    08/18/12  4:46 PM      Result Value Range   Glucose-Capillary 67 (*) 70 - 99 mg/dL  GLUCOSE, CAPILLARY     Status: Abnormal  Collection Time    08/18/12  5:25 PM      Result Value Range   Glucose-Capillary 113 (*) 70 - 99 mg/dL    Ct Head Wo Contrast  08/18/2012   *RADIOLOGY REPORT*  Clinical Data: Altered mental status.  Unresponsive behind the steering wheel.  Ventilator.  CT HEAD WITHOUT CONTRAST  Technique:  Contiguous axial images were obtained from the base of the skull through the vertex without contrast.  Comparison: None.  Findings: There are 2 focal areas of low attenuation within the right thalamus, consistent with subacute or chronic infarcts. Within the left parietal lobe, there is focal white matter change, atypical for acute infarct.  This may represent an area of chronic infarct.  However, further evaluation with MRI with contrast is recommended.  No evidence for intracranial hemorrhage.  Bone windows show chronic sinusitis involving the frontal, ethmoid, and maxillary sinuses.  The patient has an endotracheal tube in place.   IMPRESSION:  1.  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 2.  Focal low attenuation within the left parietal lobe. 3.  Further evaluation with MRI of the brain with contrast is recommended.  The findings were discussed with Dr. Ranae Palms on 08/18/2012 at 1:24 p.m.   Original Report Authenticated By: Norva Pavlov, M.D.   Dg Chest Portable 1 View  08/18/2012   *RADIOLOGY REPORT*  Clinical Data: Altered mental status.  PORTABLE CHEST - 1 VIEW  Comparison: 08/18/2012  Findings: Endotracheal tube is in place, tip proximally 3.7 cm above carina.  Nasogastric tube is in place, tip overlying the level of the stomach.  Heart is enlarged.  There is mild pulmonary vascular congestion.  No focal consolidations or pleural effusions are identified.  IMPRESSION:  1.  Interval placement nasogastric tube. 2.  Mild pulmonary vascular congestion.   Original Report Authenticated By: Norva Pavlov, M.D.   Dg Chest Port 1 View  08/18/2012   *RADIOLOGY REPORT*  Clinical Data: Status post intubation.  PORTABLE CHEST - 1 VIEW  Comparison: 10/14/2010  Findings: Heart size is accentuated by the portable AP supine technique.  Endotracheal tube is in place with tip estimated to be approximately 6.7 cm above carina.  Lungs are clear.  IMPRESSION:  1.  Interval placement of endotracheal tube, approximately 6.7 cm above carina. 2.  Lungs are clear.   Original Report Authenticated By: Norva Pavlov, M.D.     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 Neuro-imaging abnormalities are not unusual in the context of hypoglycemic encephalopathy and they are not necessarily ischemic.   Recommend: 1) MRI-DWI to better assess previously identified CT findings.  2) EEG.  3) Will follow up.  Wyatt Portela, MD [facility] [phone]  08/18/2012, 6:00 PM

## 2012-08-18 NOTE — ED Provider Notes (Signed)
History     CSN: [id number]  Arrival date & time 08/18/12  1013   First MD Initiated Contact with Patient 08/18/12 1025      Chief Complaint  Patient presents with  . Altered Mental Status    (Consider location/radiation/quality/duration/timing/severity/associated sxs/prior treatment) HPI Pt found unresponsive in parked car this AM by EMS. Unknown last time seen normal. CBG 15 and 2 amps D50 given. Agonal respirations with upper ext posturing/tonicity. Nasal intubation by EMS. Level 5 caveat.  Past Medical History  Diagnosis Date  . Diabetes mellitus   . Hypertension     Past Surgical History  Procedure Laterality Date  . Toe amputation      No family history on file.  History  Substance Use Topics  . Smoking status: Never Smoker   . Smokeless tobacco: Not on file  . Alcohol Use: Not on file      Review of Systems  Unable to perform ROS: Intubated    Allergies  Latex and Sulfa antibiotics  Home Medications   Current Outpatient Rx  Name  Route  Sig  Dispense  Refill  . cetirizine (ZYRTEC) 10 MG chewable tablet   Oral   Chew 10 mg by mouth daily.           . clotrimazole-betamethasone (LOTRISONE) cream   Topical   Apply topically 2 (two) times daily.          . Ferrous Sulfate (IRON) 325 (65 FE) MG TABS   Oral   Take 1 tablet by mouth daily.          Marland Kitchen guaiFENesin (MUCINEX) 600 MG 12 hr tablet   Oral   Take 600 mg by mouth 2 (two) times daily.           . insulin glargine (LANTUS) 100 UNIT/ML injection   Subcutaneous   Inject 55 Units into the skin daily.          . insulin lispro (HUMALOG) 100 UNIT/ML injection   Subcutaneous   Inject 8-18 Units into the skin 3 (three) times daily before meals. Use sliding scale provided by doctor         . lisinopril (PRINIVIL,ZESTRIL) 10 MG tablet   Oral   Take 5 mg by mouth daily.          . Multiple Vitamins-Iron (MULTIVITAMIN/IRON) TABS   Oral   Take 1 tablet by mouth daily.            . mupirocin (BACTROBAN) 2 % cream   Topical   Apply topically 2 (two) times daily.          Marland Kitchen omeprazole (PRILOSEC) 20 MG capsule   Oral   Take 20 mg by mouth daily.           . pramlintide (SYMLIN) 1000 MCG/ML injection   Subcutaneous   Inject 120 mcg into the skin 3 (three) times daily before meals.           . Probiotic Product (ALIGN) 4 MG CAPS   Oral   Take 1 capsule by mouth daily.            BP 138/72  Pulse 110  Resp 25  SpO2 98%  Physical Exam  Nursing note and vitals reviewed. Constitutional: He appears well-developed and well-nourished. No distress.  HENT:  Head: Normocephalic and atraumatic.  Mouth/Throat: Oropharynx is clear and moist.  Nasal ETT  Eyes:  Pin point pupils.   Neck: Normal range of motion. Neck supple.  Cardiovascular: Regular rhythm.   tachycardia  Pulmonary/Chest: Effort normal and breath sounds normal. No respiratory distress. He has no wheezes. He has no rales. He exhibits no tenderness.  Abdominal: Soft. Bowel sounds are normal. He exhibits distension. He exhibits no mass. There is no tenderness. There is no rebound and no guarding.  Musculoskeletal: He exhibits no edema and no tenderness.  Tonic bl upper ext.   Neurological:  Not following commands or localizing to pain. Raising bl upper ext in tonic type motion  Skin: Skin is dry. No rash noted. No erythema.  Cool skin    ED Course  INTUBATION Date/Time: 08/18/2012 12:15 PM Performed by: Loren Racer Authorized by: Ranae Palms, Cindy Brindisi Consent: The procedure was performed in an emergent situation. Indications: airway protection and respiratory failure Intubation method: video-assisted Patient status: paralyzed (RSI) Sedatives: etomidate Paralytic: succinylcholine Tube size: 7.5 mm Tube type: cuffed Number of attempts: 1 Cricoid pressure: no Cords visualized: yes Post-procedure assessment: chest rise and CO2 detector Breath sounds: equal Cuff inflated: yes ETT  to lip: 23 cm Tube secured with: ETT holder Chest x-ray interpreted by me and radiologist. Chest x-ray findings: endotracheal tube too high Tube repositioned: tube repositioned successfully Patient tolerance: Patient tolerated the procedure well with no immediate complications.   (including critical care time)  Labs Reviewed  CBC WITH DIFFERENTIAL - Abnormal; Notable for the following:    WBC 20.9 (*)    RBC 4.14 (*)    Neutrophils Relative % 90 (*)    Neutro Abs 18.8 (*)    Lymphocytes Relative 2 (*)    Lymphs Abs 0.5 (*)    Monocytes Absolute 1.6 (*)    All other components within normal limits  COMPREHENSIVE METABOLIC PANEL - Abnormal; Notable for the following:    Sodium 134 (*)    Potassium 6.2 (*)    Glucose, Bld 142 (*)    All other components within normal limits  URINALYSIS, ROUTINE W REFLEX MICROSCOPIC - Abnormal; Notable for the following:    Glucose, UA 100 (*)    Hgb urine dipstick TRACE (*)    All other components within normal limits  SALICYLATE LEVEL - Abnormal; Notable for the following:    Salicylate Lvl <2.0 (*)    All other components within normal limits  URINE MICROSCOPIC-ADD ON - Abnormal; Notable for the following:    Casts HYALINE CASTS (*)    All other components within normal limits  POCT I-STAT 3, BLOOD GAS (G3+) - Abnormal; Notable for the following:    pH, Arterial 7.147 (*)    pCO2 arterial 72.4 (*)    pO2, Arterial 139.0 (*)    Bicarbonate 25.0 (*)    Acid-base deficit 5.0 (*)    All other components within normal limits  POCT I-STAT 3, BLOOD GAS (G3+) - Abnormal; Notable for the following:    Bicarbonate 25.8 (*)    All other components within normal limits  CULTURE, BLOOD (ROUTINE X 2)  CULTURE, BLOOD (ROUTINE X 2)  URINE CULTURE  CULTURE, RESPIRATORY (NON-EXPECTORATED)  TROPONIN I  AMMONIA  ACETAMINOPHEN LEVEL  URINE RAPID DRUG SCREEN (HOSP PERFORMED)  ETHANOL  PROTIME-INR  COMPREHENSIVE METABOLIC PANEL  MAGNESIUM  PHOSPHORUS   LACTIC ACID, PLASMA  PROCALCITONIN  CORTISOL  CBC  PROTIME-INR  APTT  STREP PNEUMONIAE URINARY ANTIGEN  LEGIONELLA ANTIGEN, URINE  BLOOD GAS, ARTERIAL   Dg Chest Portable 1 View  08/18/2012   *RADIOLOGY REPORT*  Clinical Data: Altered mental status.  PORTABLE CHEST - 1 VIEW  Comparison: 08/18/2012  Findings: Endotracheal tube is in place, tip proximally 3.7 cm above carina.  Nasogastric tube is in place, tip overlying the level of the stomach.  Heart is enlarged.  There is mild pulmonary vascular congestion.  No focal consolidations or pleural effusions are identified.  IMPRESSION:  1.  Interval placement nasogastric tube. 2.  Mild pulmonary vascular congestion.   Original Report Authenticated By: Norva Pavlov, M.D.   Dg Chest Port 1 View  08/18/2012   *RADIOLOGY REPORT*  Clinical Data: Status post intubation.  PORTABLE CHEST - 1 VIEW  Comparison: 10/14/2010  Findings: Heart size is accentuated by the portable AP supine technique.  Endotracheal tube is in place with tip estimated to be approximately 6.7 cm above carina.  Lungs are clear.  IMPRESSION:  1.  Interval placement of endotracheal tube, approximately 6.7 cm above carina. 2.  Lungs are clear.   Original Report Authenticated By: Norva Pavlov, M.D.     1. Respiratory failure   2. Altered mental status   3. DM (diabetes mellitus)   4. Hypoglycemia   5. Obtunded     CRITICAL CARE Performed by: Ranae Palms, Tiaira Arambula Total critical care time: 45 min  Critical care time was exclusive of separately billable procedures and treating other patients. Critical care was necessary to treat or prevent imminent or life-threatening deterioration. Critical care was time spent personally by me on the following activities: development of treatment plan with patient and/or surrogate as well as nursing, discussions with consultants, evaluation of patient's response to treatment, examination of patient, obtaining history from patient or surrogate,  ordering and performing treatments and interventions, ordering and review of laboratory studies, ordering and review of radiographic studies, pulse oximetry and re-evaluation of patient's condition.    MDM  Discussed with Critical Care who now is at bedside and assumes care for pt.         Loren Racer, MD 08/18/12 1250

## 2012-08-18 NOTE — ED Notes (Signed)
Triple lumen central line inserted.

## 2012-08-18 NOTE — Progress Notes (Signed)
Tracheal aspirate sputum sample collected via ETT.  Sample sent to lab.

## 2012-08-18 NOTE — Progress Notes (Signed)
1. Stroke   Neurology note reviewed. MRI/MRA head ordered.   2. Hypophosphatemia   Ph replaced  3. Hypoglycemia   D10 in creased to 200 cc/h.

## 2012-08-18 NOTE — Progress Notes (Signed)
Patient's minister Thomas Frye has possession of patient's car. His number is [phone]

## 2012-08-18 NOTE — Progress Notes (Signed)
ANTIBIOTIC CONSULT NOTE - INITIAL  Pharmacy Consult for zosyn Indication: rule out pneumonia  Allergies  Allergen Reactions  . Latex Swelling  . Sulfa Antibiotics Hives    Patient Measurements: Height: 6\' 4"  (193 cm) Weight: 288 lb 12.8 oz (131 kg) IBW/kg (Calculated) : 86.8  Vital Signs: Temp: 97.9 F (36.6 C) (05/24 1436) Temp src: Other (Comment) (05/24 1436) BP: 116/51 mmHg (05/24 1436) Pulse Rate: 98 (05/24 1436) Intake/Output from previous day:   Intake/Output from this shift: Total I/O In: 135 [I.V.:25; NG/GT:60; IV Piggyback:50] Out: 625 [Urine:625]  Labs:  Recent Labs  08/18/12 1024 08/18/12 1400  WBC 20.9* 15.5*  HGB 13.5 11.6*  PLT 260 227  CREATININE 0.66 0.62   Estimated Creatinine Clearance: 156 ml/min (by C-G formula based on Cr of 0.62). No results found for this basename: VANCOTROUGH, Leodis Binet, VANCORANDOM, GENTTROUGH, GENTPEAK, GENTRANDOM, TOBRATROUGH, TOBRAPEAK, TOBRARND, AMIKACINPEAK, AMIKACINTROU, AMIKACIN,  in the last 72 hours   Microbiology: Recent Results (from the past 720 hour(s))  MRSA PCR SCREENING     Status: None   Collection Time    08/18/12  2:08 PM      Result Value Range Status   MRSA by PCR NEGATIVE  NEGATIVE Final   Comment:            The GeneXpert MRSA Assay (FDA     approved for NASAL specimens     only), is one component of a     comprehensive MRSA colonization     surveillance program. It is not     intended to diagnose MRSA     infection nor to guide or     monitor treatment for     MRSA infections.    Medical History: Past Medical History  Diagnosis Date  . Diabetes mellitus   . Hypertension     Medications:  Anti-infectives   Start     Dose/Rate Route Frequency Ordered Stop   08/18/12 1800  piperacillin-tazobactam (ZOSYN) IVPB 3.375 g     3.375 g 12.5 mL/hr over 240 Minutes Intravenous Every 8 hours 08/18/12 1713     08/18/12 1400  ceFAZolin (ANCEF) IVPB 1 g/50 mL premix  Status:  Discontinued      1 g 100 mL/hr over 30 Minutes Intravenous 3 times per day 08/18/12 1215 08/18/12 1711     Assessment: 56 yom presented to the ED  After being found unresponsive. To start empiric zosyn for possible aspiration pneumonia. Afebrile and WBC is elevated at 15.5. Good renal function.  Goal of Therapy:  Eradication of infection  Plan:  1. Zosyn 3.375gm IV Q8H (4 hr inf) 2. F/u renal fxn, C&S, clinical status   Farmer Mccahill, Drake Leach 08/18/2012,5:13 PM

## 2012-08-18 NOTE — ED Notes (Signed)
Ativan wasted with hayley ringley rn, was taken out under temp name and temp name was deleted from pyxis.refer to her note also

## 2012-08-18 NOTE — ED Notes (Signed)
CCM at bedside 

## 2012-08-19 ENCOUNTER — Inpatient Hospital Stay (HOSPITAL_COMMUNITY): Payer: BC Managed Care – PPO

## 2012-08-19 ENCOUNTER — Encounter (HOSPITAL_COMMUNITY): Payer: Self-pay

## 2012-08-19 DIAGNOSIS — L97911 Non-pressure chronic ulcer of unspecified part of right lower leg limited to breakdown of skin: Secondary | ICD-10-CM | POA: Diagnosis present

## 2012-08-19 LAB — BLOOD GAS, ARTERIAL
Drawn by: 34779
MECHVT: 650 mL
PEEP: 5 cmH2O
Patient temperature: 99.2
RATE: 24 resp/min
TCO2: 25.7 mmol/L (ref 0–100)
pCO2 arterial: 28.3 mmHg — ABNORMAL LOW (ref 35.0–45.0)
pH, Arterial: 7.554 — ABNORMAL HIGH (ref 7.350–7.450)

## 2012-08-19 LAB — LEGIONELLA ANTIGEN, URINE: Legionella Antigen, Urine: NEGATIVE

## 2012-08-19 LAB — CBC
HCT: 31.8 % — ABNORMAL LOW (ref 39.0–52.0)
Hemoglobin: 11.1 g/dL — ABNORMAL LOW (ref 13.0–17.0)
MCV: 92.4 fL (ref 78.0–100.0)
RDW: 13.1 % (ref 11.5–15.5)
WBC: 11.1 10*3/uL — ABNORMAL HIGH (ref 4.0–10.5)

## 2012-08-19 LAB — PHOSPHORUS: Phosphorus: 2.7 mg/dL (ref 2.3–4.6)

## 2012-08-19 LAB — MAGNESIUM: Magnesium: 1.7 mg/dL (ref 1.5–2.5)

## 2012-08-19 LAB — BASIC METABOLIC PANEL
BUN: 13 mg/dL (ref 6–23)
CO2: 25 mEq/L (ref 19–32)
Chloride: 98 mEq/L (ref 96–112)
Chloride: 91 mEq/L — ABNORMAL LOW (ref 96–112)
Creatinine, Ser: 0.9 mg/dL (ref 0.50–1.35)
GFR calc Af Amer: >90 mL/min (ref >90)
GFR calc Af Amer: 89 mL/min — ABNORMAL LOW (ref >90)
GFR calc non Af Amer: 77 mL/min — ABNORMAL LOW (ref >90)
Glucose, Bld: 70 mg/dL (ref 70–99)
Potassium: 2.9 mEq/L — ABNORMAL LOW (ref 3.5–5.1)
Potassium: 4.4 mEq/L (ref 3.5–5.1)
Sodium: 126 mEq/L — ABNORMAL LOW (ref 135–145)

## 2012-08-19 LAB — GLUCOSE, CAPILLARY
Glucose-Capillary: 72 mg/dL (ref 70–99)
Glucose-Capillary: 72 mg/dL (ref 70–99)
Glucose-Capillary: 67 mg/dL — ABNORMAL LOW (ref 70–99)
Glucose-Capillary: 94 mg/dL (ref 70–99)
Glucose-Capillary: 99 mg/dL (ref 70–99)
Glucose-Capillary: 99 mg/dL (ref 70–99)
Glucose-Capillary: 346 mg/dL — ABNORMAL HIGH (ref 70–99)
Glucose-Capillary: 422 mg/dL — ABNORMAL HIGH (ref 70–99)

## 2012-08-19 MED ORDER — POTASSIUM CHLORIDE 10 MEQ/50ML IV SOLN
10.0000 meq | INTRAVENOUS | Status: AC
  Administered 2012-08-19 (×5): 10 meq via INTRAVENOUS
  Filled 2012-08-19: qty 250

## 2012-08-19 MED ORDER — INSULIN ASPART 100 UNIT/ML ~~LOC~~ SOLN
0.0000 [IU] | SUBCUTANEOUS | Status: DC
  Administered 2012-08-19 (×2): 9 [IU] via SUBCUTANEOUS
  Administered 2012-08-20: 5 [IU] via SUBCUTANEOUS
  Administered 2012-08-20: 3 [IU] via SUBCUTANEOUS
  Administered 2012-08-20 (×3): 7 [IU] via SUBCUTANEOUS
  Administered 2012-08-21 (×6): 3 [IU] via SUBCUTANEOUS

## 2012-08-19 MED ORDER — POTASSIUM CHLORIDE 10 MEQ/50ML IV SOLN
INTRAVENOUS | Status: AC
  Administered 2012-08-19: 10 meq via INTRAVENOUS
  Filled 2012-08-19: qty 50

## 2012-08-19 MED ORDER — FUROSEMIDE 10 MG/ML IJ SOLN
20.0000 mg | Freq: Once | INTRAMUSCULAR | Status: AC
  Administered 2012-08-19: 20 mg via INTRAVENOUS
  Filled 2012-08-19: qty 2

## 2012-08-19 MED ORDER — DEXTROSE-NACL 5-0.45 % IV SOLN
INTRAVENOUS | Status: DC
  Administered 2012-08-19 – 2012-08-21 (×2): via INTRAVENOUS

## 2012-08-19 NOTE — Progress Notes (Signed)
eLink Physician-Brief Progress Note Patient Name: Thomas Frye DOB: 01/23/1956 MRN: [id number]  Date of Service  08/19/2012   HPI/Events of Note   potassium 2.9   eICU Interventions   IV KCl x6 runs Check magnesium and phosphorus    Intervention Category Major Interventions: Electrolyte abnormality - evaluation and management  Johnedward Brodrick 08/19/2012, 6:03 AM

## 2012-08-19 NOTE — Progress Notes (Signed)
eLink Physician-Brief Progress Note Patient Name: Thomas Frye DOB: 1955/07/20 MRN: [id number]  Date of Service  08/19/2012   HPI/Events of Note   IDDM - adm with hypoglycemia CBGs now 300 on d51/2 @ 50/h  eICU Interventions  Drop d51/2 to 25/h Add  Sensitive scale - let CBGs run 200 range   Intervention Category Intermediate Interventions: Hyperglycemia - evaluation and treatment  Thomas Frye V. 08/19/2012, 4:56 PM

## 2012-08-19 NOTE — Consult Note (Signed)
WOC consult Note Reason for Consult:R foot diabetic ulceration.  Under care of diabetologist in [place] x 1 year and ulcer has significantly decreased in size.  Was planning for skin graft (?A-Cell) on 5/30. History is that ulceration is a result of a contact cast (Medical Device Related PrU.) Wound type:Neuropathic Pressure Ulcer POA: Yes Measurement:3cm x 1.5cm x .4cm Wound NWG:NFAO pink, moist Drainage (amount, consistency, odor) scant Periwound:intact.  Minor callus formation peripherally. Dressing procedure/placement/frequency:I will suggest conservative dressings until patient can return to [facility] following this ulceration.  Twice daily saline moistened dressing secure with Kerlix instead of tape.  Wife and family present for exam and provide history and plan.  Agree with this POC while hospitalized at [facility]. I will not follow, but will remain available to this patient and his medical and nursing teams.  Please re-consult if needed. Thanks, Ladona Mow, MSN, RN, [facility], CWOCN [phone])

## 2012-08-19 NOTE — Progress Notes (Signed)
CRITICAL VALUE ALERT  Critical value received:  Cbg= 422  Date of notification:  08/19/12  Time of notification:  1955  Critical value read back:yes  Nurse who received alert:  Dustin Flock RN  MD notified (1st page):  Dr Vassie Loll  Time of first page:  2010  MD notified (2nd page):  Time of second page:  Responding MD:  Dr Vassie Loll  Time MD responded:  2037

## 2012-08-19 NOTE — Progress Notes (Signed)
Hypoglycemic Event  CBG: 67  Treatment: D50 IV 50 mL  Symptoms: None  Follow-up CBG: Time:0517 CBG Result:140  Possible Reasons for Event: Unknown  Comments/MD notified:standing D50 and D10 infusion    Andy Gauss  Remember to initiate Hypoglycemia Order Set & complete

## 2012-08-19 NOTE — Progress Notes (Signed)
Hypoglycemic Event  CBG: 57  Treatment: D50 50 cc  Symptoms: Sweaty  Follow-up CBG: Time: 2043 CBG Result:99  Possible Reasons for Event: Unknown  Comments/MD notified:standing order and D10 infusing     Andy Gauss  Remember to initiate Hypoglycemia Order Set & complete

## 2012-08-19 NOTE — Progress Notes (Signed)
Hypoglycemic Event  CBG: 68  Treatment: D50 IV 50 mL  Symptoms: None  Follow-up CBG: Time:0255 CBG Result:0209  Possible Reasons for Event: Unknown  Comments/MD notified:standing D50 and D10 infusion     Andy Gauss  Remember to initiate Hypoglycemia Order Set & complete

## 2012-08-19 NOTE — Progress Notes (Addendum)
PULMONARY  / CRITICAL CARE MEDICINE  Name: Thomas Frye MRN: [id number] DOB: Feb 08, 1956    ADMISSION DATE:  08/18/2012    REFERRING MD : EDP PRIMARY SERVICE: PCCM  CHIEF COMPLAINT:  VDRF  BRIEF PATIENT DESCRIPTION:  57 yo Wm found unresponsive in his care. Found to be profoundly hypoglycemic and unresponsive. NT intubation in the field and D50 boluses administered and transported to [facility] ED. There is a  Question of seizure like activity and CT of the head is planned. ET placed by EDP. Hypoglycemia has been treated.   He has a Hx of HTN and PVD secondary to DM. He has a space boot on rt ankle and a rt foot silver dollar wound has purulent drainage and has obviously been treated by WOC type facility. PCCM has been asked to admit. We will check CT head and may call Neuro consult if tonic clonic activity continues once metabolic disarray has been corrected.  SIGNIFICANT EVENTS / STUDIES:  5-24 hypogl 5-24 ct head rt thalamic infarcts  LINES / TUBES: 5-24 OTT(EDP)>> 5-24 Lt i j cvl>>  CULTURES: 5-24 rt foot>>  ANTIBIOTICS: 5-24 ancef>>  SUBJECTIVE:  Remains  on D10 VITAL SIGNS: Temp:  [97.9 F (36.6 C)-101.1 F (38.4 C)] 98.4 F (36.9 C) (05/25 1000) Pulse Rate:  [84-110] 87 (05/25 1000) Resp:  [14-25] 19 (05/25 1000) BP: (102-157)/(51-86) 157/76 mmHg (05/25 1000) SpO2:  [97 %-100 %] 99 % (05/25 1000) FiO2 (%):  [40 %-50 %] 40 % (05/25 0816) Weight:  [131 kg (288 lb 12.8 oz)-133.2 kg (293 lb 10.4 oz)] 133.2 kg (293 lb 10.4 oz) (05/25 0500) HEMODYNAMICS:   VENTILATOR SETTINGS: Vent Mode:  [-] CPAP FiO2 (%):  [40 %-50 %] 40 % Set Rate:  [14 bmp-24 bmp] 14 bmp Vt Set:  [650 mL] 650 mL PEEP:  [5 cmH20] 5 cmH20 Pressure Support:  [5 cmH20] 5 cmH20 Plateau Pressure:  [16 cmH20-20 cmH20] 16 cmH20 INTAKE / OUTPUT: Intake/Output     05/24 0701 - 05/25 0700 05/25 0701 - 05/26 0700   I.V. (mL/kg) 3239.7 (24.3)    NG/GT 150 30   IV Piggyback 464 200   Total Intake(mL/kg)  3853.7 (28.9) 230 (1.7)   Urine (mL/kg/hr) 1280 50 (0.1)   Emesis/NG output  200 (0.4)   Total Output 1280 250   Net +2573.7 -20          PHYSICAL EXAMINATION: General: off sedation. Obese. Neuro:  Rt arm tonic type movement when not sedated, left side e\weakness, opens eyes HEENT:  Ott-> vent Cardiovascular: hsr rrr Lungs:  Coarse rhonchi bilat Abdomen:  Obese +bs Musculoskeletal:  Multiple missing toes. Rt foot ulcer with purulent drainage Skin:  warm  LABS:  Recent Labs Lab 08/18/12 1024 08/18/12 1036 08/18/12 1047 08/18/12 1059 08/18/12 1206 08/18/12 1227 08/18/12 1400 08/19/12 0430 08/19/12 0445 08/19/12 0630  HGB 13.5  --   --   --   --   --  11.6* 11.1*  --   --   WBC 20.9*  --   --   --   --   --  15.5* 11.1*  --   --   PLT 260  --   --   --   --   --  227 212  --   --   NA 134*  --   --   --   --   --  134* 133*  --   --   K 6.2*  --   --   --   --   --  3.3* 2.9*  --   --   CL 97  --   --   --   --   --  100 98  --   --   CO2 26  --   --   --   --   --  26 25  --   --   GLUCOSE 142*  --   --   --   --   --  128* 70  --   --   BUN 22  --   --   --   --   --  19 13  --   --   CREATININE 0.66  --   --   --   --   --  0.62 0.90  --   --   CALCIUM 9.7  --   --   --   --   --  9.1 9.2  --   --   MG  --   --   --   --   --   --  1.8  --   --  1.7  PHOS  --   --   --   --   --   --  0.7*  --   --  2.7  AST 36  --   --   --   --   --  44*  --   --   --   ALT 19  --   --   --   --   --  17  --   --   --   ALKPHOS 68  --   --   --   --   --  54  --   --   --   BILITOT 0.3  --   --   --   --   --  0.4  --   --   --   PROT 7.8  --   --   --   --   --  6.3  --   --   --   ALBUMIN 3.7  --   --   --   --   --  3.1*  --   --   --   APTT  --   --   --   --   --   --  29  --   --   --   INR  --   --   --  0.87  --   --  0.89  --   --   --   LATICACIDVEN  --   --   --   --  1.0  --   --   --   --   --   TROPONINI  --  <0.30  --   --   --   --   --   --   --   --    PROCALCITON  --   --   --   --  <0.10  --   --   --   --   --   PHART  --   --  7.147*  --   --  7.379  --   --  7.554*  --   PCO2ART  --   --  72.4*  --   --  43.7  --   --  28.3*  --   PO2ART  --   --  139.0*  --   --  87.0  --   --  114.0*  --     Recent Labs Lab 08/19/12 0500 08/19/12 0517 08/19/12 0557 08/19/12 0656 08/19/12 0727  GLUCAP 67* 140* 98 94 99    Imaging: Ct Head Wo Contrast  08/18/2012   *RADIOLOGY REPORT*  Clinical Data: Altered mental status.  Unresponsive behind the steering wheel.  Ventilator.  CT HEAD WITHOUT CONTRAST  Technique:  Contiguous axial images were obtained from the base of the skull through the vertex without contrast.  Comparison: None.  Findings: There are 2 focal areas of low attenuation within the right thalamus, consistent with subacute or chronic infarcts. Within the left parietal lobe, there is focal white matter change, atypical for acute infarct.  This may represent an area of chronic infarct.  However, further evaluation with MRI with contrast is recommended.  No evidence for intracranial hemorrhage.  Bone windows show chronic sinusitis involving the frontal, ethmoid, and maxillary sinuses.  The patient has an endotracheal tube in place.  IMPRESSION:  1.  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 2.  Focal low attenuation within the left parietal lobe. 3.  Further evaluation with MRI of the brain with contrast is recommended.  The findings were discussed with Dr. Ranae Palms on 08/18/2012 at 1:24 p.m.   Original Report Authenticated By: Norva Pavlov, M.D.   Dg Chest Portable 1 View  08/18/2012   *RADIOLOGY REPORT*  Clinical Data: Altered mental status.  PORTABLE CHEST - 1 VIEW  Comparison: 08/18/2012  Findings: Endotracheal tube is in place, tip proximally 3.7 cm above carina.  Nasogastric tube is in place, tip overlying the level of the stomach.  Heart is enlarged.  There is mild pulmonary vascular congestion.  No focal  consolidations or pleural effusions are identified.  IMPRESSION:  1.  Interval placement nasogastric tube. 2.  Mild pulmonary vascular congestion.   Original Report Authenticated By: Norva Pavlov, M.D.   Dg Chest Port 1 View  08/18/2012   *RADIOLOGY REPORT*  Clinical Data: Status post intubation.  PORTABLE CHEST - 1 VIEW  Comparison: 10/14/2010  Findings: Heart size is accentuated by the portable AP supine technique.  Endotracheal tube is in place with tip estimated to be approximately 6.7 cm above carina.  Lungs are clear.  IMPRESSION:  1.  Interval placement of endotracheal tube, approximately 6.7 cm above carina. 2.  Lungs are clear.   Original Report Authenticated By: Norva Pavlov, M.D.     ASSESSMENT / PLAN:  PULMONARY A:VDRF secondary to ams and hypoglycemia.  P:   -Wean when stable  CARDIOVASCULAR A: HTN P:  Check CE for completeness(neg) Treat HTN as needed  RENAL Lab Results  Component Value Date   CREATININE 0.90 08/19/2012   CREATININE 0.62 08/18/2012   CREATININE 0.66 08/18/2012    A:  No acute issue P:   monitor  GASTROINTESTINAL A:  GI Protection, emesis so cannot start TF P:   PPI Tube feeds within 24 hours(hold due increase ng drainage 5/25)  HEMATOLOGIC Lab Results  Component Value Date   INR 0.89 08/18/2012   INR 0.87 08/18/2012    A:  No acute issue P:  Check INR for completeness  INFECTIOUS A:  Cellulitis rt lower foot [facility] Dr. Marco Collie P:   Culture foot Abx as needed Change foot dressing  ENDOCRINE A:  Hypoglycemia(? Insulin OD)) DM  P:   Hourly CBG till glucose >100 x 6 hours, then q 4h D50 as needed D10 at 100 till  glucose stablized  NEUROLOGIC A:  Ams, ? Seizure, hypolycemic , metabolic encephalopathy. ? Intentional or unintentional insuli overdose is a consideration. 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  ?MRI, he has hardware in rt tibial fx that will preclude  MRI See neuro note   TODAY'S SUMMARY:  57 yo Wm found unresponsive in his care. Found to be profoundly hypoglycemic and unresponsive. NT intubation in the field and D50 boluses administered and transported to [facility] ED. There is a  Question of seizure like activity and CT of the head is planned. ET placed by EDP. Hypoglycemia has been treated.   He has a Hx of HTN and PVD secondary to DM. He has a space boot on rt ankle and a rt foot silver dollar wound has purulent drainage and has obviously been treated by [facility]OC type facility. PCCM has been asked to admit. CT head ?ischemic change vs changes of hypoglycemia 5-25 remains on high dose D10 with cbg of 99. Left side weakness noted. Family updated at bedside  CC Shan Levans MD Beeper  [phone]  Cell  [phone]  If no response or cell goes to voicemail, call beeper [phone]  08/19/2012, 10:45 AM

## 2012-08-20 ENCOUNTER — Inpatient Hospital Stay (HOSPITAL_COMMUNITY): Payer: BC Managed Care – PPO

## 2012-08-20 DIAGNOSIS — E1159 Type 2 diabetes mellitus with other circulatory complications: Secondary | ICD-10-CM

## 2012-08-20 DIAGNOSIS — I798 Other disorders of arteries, arterioles and capillaries in diseases classified elsewhere: Secondary | ICD-10-CM

## 2012-08-20 LAB — BASIC METABOLIC PANEL
CO2: 23 mEq/L (ref 19–32)
Calcium: 8.9 mg/dL (ref 8.4–10.5)
Chloride: 94 mEq/L — ABNORMAL LOW (ref 96–112)
Creatinine, Ser: 1.14 mg/dL (ref 0.50–1.35)
Glucose, Bld: 367 mg/dL — ABNORMAL HIGH (ref 70–99)
Sodium: 130 mEq/L — ABNORMAL LOW (ref 135–145)

## 2012-08-20 LAB — CBC
Hemoglobin: 11.1 g/dL — ABNORMAL LOW (ref 13.0–17.0)
MCH: 32.4 pg (ref 26.0–34.0)
MCV: 92.1 fL (ref 78.0–100.0)
Platelets: 209 10*3/uL (ref 150–400)
RBC: 3.43 MIL/uL — ABNORMAL LOW (ref 4.22–5.81)
WBC: 10.5 10*3/uL (ref 4.0–10.5)

## 2012-08-20 LAB — URINE CULTURE: Culture: NO GROWTH

## 2012-08-20 LAB — GLUCOSE, CAPILLARY
Glucose-Capillary: 350 mg/dL — ABNORMAL HIGH (ref 70–99)
Glucose-Capillary: 317 mg/dL — ABNORMAL HIGH (ref 70–99)
Glucose-Capillary: 307 mg/dL — ABNORMAL HIGH (ref 70–99)
Glucose-Capillary: 247 mg/dL — ABNORMAL HIGH (ref 70–99)

## 2012-08-20 NOTE — Progress Notes (Signed)
PULMONARY  / CRITICAL CARE MEDICINE  Name: Thomas Frye MRN: [id number] DOB: Oct 22, 1955    ADMISSION DATE:  08/18/2012    REFERRING MD : EDP PRIMARY SERVICE: PCCM  CHIEF COMPLAINT:  VDRF  BRIEF PATIENT DESCRIPTION:  57 yo Wm found unresponsive in his care. Found to be profoundly hypoglycemic and unresponsive. NT intubation in the field and D50 boluses administered and transported to [facility] ED. There is a  Question of seizure like activity and CT of the head is planned. ET placed by EDP. Hypoglycemia has been treated.   He has a Hx of HTN and PVD secondary to DM. He has a space boot on rt ankle and a rt foot silver dollar wound has purulent drainage and has obviously been treated by [facility] type facility. PCCM has been asked to admit. We will check CT head and may call Neuro consult if tonic clonic activity continues once metabolic disarray has been corrected.  SIGNIFICANT EVENTS / STUDIES:  5-24 hypogl 5-24 ct head rt thalamic infarcts  LINES / TUBES: 5-24 OTT(EDP)>> 5-24 Lt i j cvl>>  CULTURES: 5-24 rt foot>>  ANTIBIOTICS: 5-24 ancef>>5/25 Zosyn 5/25>>  SUBJECTIVE:  More alert VITAL SIGNS: Temp:  [98.3 F (36.8 C)-99.2 F (37.3 C)] 98.4 F (36.9 C) (05/26 0800) Pulse Rate:  [79-103] 85 (05/26 1300) Resp:  [13-22] 14 (05/26 1300) BP: (92-154)/(48-99) 132/69 mmHg (05/26 1300) SpO2:  [99 %-100 %] 100 % (05/26 1300) FiO2 (%):  [40 %] 40 % (05/26 1217) Weight:  [131.2 kg (289 lb 3.9 oz)] 131.2 kg (289 lb 3.9 oz) (05/26 0500) HEMODYNAMICS: CV stable   VENTILATOR SETTINGS: Vent Mode:  [-] PRVC FiO2 (%):  [40 %] 40 % Set Rate:  [14 bmp] 14 bmp Vt Set:  [650 mL] 650 mL PEEP:  [5 cmH20] 5 cmH20 Plateau Pressure:  [9 cmH20-20 cmH20] 16 cmH20 INTAKE / OUTPUT: Intake/Output     05/25 0701 - 05/26 0700 05/26 0701 - 05/27 0700   I.V. (mL/kg) 1790.1 (13.6) 150 (1.1)   NG/GT 180    IV Piggyback 400 50   Total Intake(mL/kg) 2370.1 (18.1) 200 (1.5)   Urine (mL/kg/hr) 3583  (1.1) 1125 (1.2)   Emesis/NG output 1000 (0.3) 300 (0.3)   Total Output 4583 1425   Net -2212.9 -1225        Stool Occurrence 1 x      PHYSICAL EXAMINATION: General: off sedation. Obese. Neuro:  More alert, will track and opens eyes HEENT:  Ott-> vent Cardiovascular: hsr rrr Lungs:  Clearer Abdomen:  Obese +bs Musculoskeletal:  Multiple missing toes. Rt foot ulcer with purulent drainage Skin:  warm  LABS:  Recent Labs Lab 08/18/12 1024 08/18/12 1036 08/18/12 1047 08/18/12 1059 08/18/12 1206 08/18/12 1227 08/18/12 1400 08/19/12 0430 08/19/12 0445 08/19/12 0630 08/19/12 2048 08/20/12 0355  HGB 13.5  --   --   --   --   --  11.6* 11.1*  --   --   --  11.1*  WBC 20.9*  --   --   --   --   --  15.5* 11.1*  --   --   --  10.5  PLT 260  --   --   --   --   --  227 212  --   --   --  209  NA 134*  --   --   --   --   --  134* 133*  --   --  126* 130*  K 6.2*  --   --   --   --   --  3.3* 2.9*  --   --  4.4 4.1  CL 97  --   --   --   --   --  100 98  --   --  91* 94*  CO2 26  --   --   --   --   --  26 25  --   --  24 23  GLUCOSE 142*  --   --   --   --   --  128* 70  --   --  445* 367*  BUN 22  --   --   --   --   --  19 13  --   --  18 21  CREATININE 0.66  --   --   --   --   --  0.62 0.90  --   --  1.07 1.14  CALCIUM 9.7  --   --   --   --   --  9.1 9.2  --   --  8.9 8.9  MG  --   --   --   --   --   --  1.8  --   --  1.7  --   --   PHOS  --   --   --   --   --   --  0.7*  --   --  2.7  --   --   AST 36  --   --   --   --   --  44*  --   --   --   --   --   ALT 19  --   --   --   --   --  17  --   --   --   --   --   ALKPHOS 68  --   --   --   --   --  54  --   --   --   --   --   BILITOT 0.3  --   --   --   --   --  0.4  --   --   --   --   --   PROT 7.8  --   --   --   --   --  6.3  --   --   --   --   --   ALBUMIN 3.7  --   --   --   --   --  3.1*  --   --   --   --   --   APTT  --   --   --   --   --   --  29  --   --   --   --   --   INR  --   --   --  0.87  --    --  0.89  --   --   --   --   --   LATICACIDVEN  --   --   --   --  1.0  --   --   --   --   --   --   --   TROPONINI  --  <0.30  --   --   --   --   --   --   --   --   --   --  PROCALCITON  --   --   --   --  <0.10  --   --   --   --   --   --   --   PHART  --   --  7.147*  --   --  7.379  --   --  7.554*  --   --   --   PCO2ART  --   --  72.4*  --   --  43.7  --   --  28.3*  --   --   --   PO2ART  --   --  139.0*  --   --  87.0  --   --  114.0*  --   --   --     Recent Labs Lab 08/19/12 1954 08/19/12 2349 08/20/12 0404 08/20/12 0746 08/20/12 1158  GLUCAP 422* 359* 350* 317* 307*    Imaging: Dg Chest Port 1 View  08/20/2012   *RADIOLOGY REPORT*  Clinical Data: Endotracheal tube placement  PORTABLE CHEST - 1 VIEW  Comparison: 08/19/2012; 08/18/2012; 10/13/2010  Findings: Grossly unchanged cardiac silhouette and mediastinal contours.  Stable position of support apparatus.  Grossly unchanged minimal bibasilar heterogeneous opacities, left greater than right. No definite pleural effusion.  No pneumothorax.  Unchanged bones.  IMPRESSION: 1.  Stable positioning of support apparatus.  No pneumothorax. 2.  Grossly unchanged bibasilar atelectasis, left greater than right.   Original Report Authenticated By: Tacey Ruiz, MD   Dg Chest Port 1 View  08/19/2012   *RADIOLOGY REPORT*  Clinical Data: Endotracheal tube placement  PORTABLE CHEST - 1 VIEW  Comparison: 08/18/2012  Findings: Endotracheal tube terminates 5 cm above the carina.  Stable left IJ venous catheter and enteric tube.  Lungs are essentially clear.  No focal consolidation.  No pleural effusion or pneumothorax.  Heart is top normal in size.  IMPRESSION: Endotracheal tube terminates 5 cm above the carina.   Original Report Authenticated By: Charline Bills, M.D.     ASSESSMENT / PLAN:  PULMONARY A:VDRF secondary to ams and hypoglycemia. Neuro is rate limit step P:   -Wean when stable  CARDIOVASCULAR A: HTN P:   Treat HTN  as needed  RENAL Lab Results  Component Value Date   CREATININE 1.14 08/20/2012   CREATININE 1.07 08/19/2012   CREATININE 0.90 08/19/2012    A:  No acute issue P:   monitor  GASTROINTESTINAL A:  GI Protection, emesis so cannot start TF P:   PPI Tube feeds   HEMATOLOGIC Lab Results  Component Value Date   INR 0.89 08/18/2012   INR 0.87 08/18/2012    A:  No acute issue P:  Check INR for completeness  INFECTIOUS A:  Cellulitis rt lower foot [facility] Dr. Marco Collie P:   Culture foot Abx as needed Change foot dressing  ENDOCRINE A:  Hypoglycemia(? Insulin OD)) DM  P:   Stop D10W  , use NS SSI  NEUROLOGIC A:  Ams, ? Seizure, hypolycemic , metabolic encephalopathy. ? Intentional or unintentional insuli overdose is a consideration. 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  ?MRI, he has hardware in rt tibial fx that will preclude MRI See neuro note   TODAY'S SUMMARY:  57 yo Wm found unresponsive in his care. Found to be profoundly hypoglycemic and unresponsive. NT intubation in the field and D50 boluses administered and transported to [facility] ED. There is a  Question of  seizure like activity and CT of the head is planned. ET placed by EDP. Hypoglycemia has been treated.   He has a Hx of HTN and PVD secondary to DM. He has a space boot on rt ankle and a rt foot silver dollar wound has purulent drainage and has obviously been treated by [facility] type facility.  Pt to cont full vent for now.  Plan cont BSabx  CC Shan Levans MD Beeper  [phone]  Cell  [phone]  If no response or cell goes to voicemail, call beeper [phone]  08/20/2012, 2:08 PM

## 2012-08-20 NOTE — Progress Notes (Signed)
Referral received today to find next of kin. CSW was able to reach the pt's wife via the cell # on the facesheet. No other CSW needs at this time.   Sherald Barge, LCSW-A Clinical Social Worker [phone]

## 2012-08-20 NOTE — Progress Notes (Signed)
INITIAL NUTRITION ASSESSMENT  DOCUMENTATION CODES Per approved criteria  -Obesity Unspecified   INTERVENTION:  If unable to extubate patient within the next 12-24 hours, recommend initiate TF via OGT with Glucerna 1.2 at 20 ml/h, increase by 10 ml every 4 hours to goal rate of 50 ml/h with Prostat 60 ml QID to provide 2240 kcals (24.4 kcals/kg ideal weight), 192 gm protein, 966 ml free water daily.  NUTRITION DIAGNOSIS: Inadequate oral intake related to inability to eat as evidenced by NPO status.   Goal: Enteral nutrition to provide 60-70% of estimated calorie needs (22-25 kcals/kg ideal body weight) and 100% of estimated protein needs, based on ASPEN guidelines for permissive underfeeding in critically ill obese individuals.  Monitor:  TF initiation/tolerance/adequacy, weight trend, labs, vent status.  Reason for Assessment: VDRF  57 y.o. male  Admitting Dx: Hypoglycemia  ASSESSMENT: Patient was found unresponsive in his car. Found to be profoundly hypoglycemic and unresponsive. Required intubation in the field, transported to [facility] ED.   TF has not been initiated yet due to elevated residuals per MD note. OGT is in place.  Patient is currently intubated on ventilator support.  MV: 9.8 Temp:Temp (24hrs), Avg:98.9 F (37.2 C), Min:98.3 F (36.8 C), Max:99.2 F (37.3 C)    Height: Ht Readings from Last 1 Encounters:  08/18/12 6\' 4"  (1.93 m)    Weight: Wt Readings from Last 1 Encounters:  08/20/12 289 lb 3.9 oz (131.2 kg)    Ideal Body Weight: 91.8 kg  % Ideal Body Weight: 143%  Wt Readings from Last 10 Encounters:  08/20/12 289 lb 3.9 oz (131.2 kg)  12/10/10 286 lb (129.729 kg)    Usual Body Weight: 286 lb (~2 years ago)  % Usual Body Weight: 101%  BMI:  Body mass index is 35.22 kg/(m^2). 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 Wound is  the result of a contact cast. Was planning for skin graft on 5/30.  Diet Order: NPO  EDUCATION NEEDS: -Education not appropriate at this time   Intake/Output Summary (Last 24 hours) at 08/20/12 1141 Last data filed at 08/20/12 1100  Gross per 24 hour  Intake 2190.14 ml  Output   4950 ml  Net -2759.86 ml    Last BM: 5/26   Labs:   Recent Labs Lab 08/18/12 1024 08/18/12 1400 08/19/12 0430 08/19/12 0630 08/19/12 2048 08/20/12 0355  NA 134* 134* 133*  --  126* 130*  K 6.2* 3.3* 2.9*  --  4.4 4.1  CL 97 100 98  --  91* 94*  CO2 26 26 25   --  24 23  BUN 22 19 13   --  18 21  CREATININE 0.66 0.62 0.90  --  1.07 1.14  CALCIUM 9.7 9.1 9.2  --  8.9 8.9  MG  --  1.8  --  1.7  --   --   PHOS  --  0.7*  --  2.7  --   --   GLUCOSE 142* 128* 70  --  445* 367*    CBG (last 3)   Recent Labs  08/19/12 2349 08/20/12 0404 08/20/12 0746  GLUCAP 359* 350* 317*    Scheduled Meds: . antiseptic oral rinse  15 mL Mouth Rinse QID  . chlorhexidine  15 mL Mouth Rinse BID  . heparin subcutaneous  5,000 Units Subcutaneous Q8H  . insulin aspart  0-9  Units Subcutaneous Q4H  . pantoprazole (PROTONIX) IV  40 mg Intravenous QHS  . piperacillin-tazobactam (ZOSYN)  IV  3.375 g Intravenous Q8H  . sodium chloride  10-40 mL Intracatheter Q12H    Continuous Infusions: . dextrose 5 % and 0.45% NaCl 25 mL/hr at 08/20/12 1100  . fentaNYL infusion INTRAVENOUS Stopped (08/20/12 0700)  . propofol Stopped (08/19/12 1900)    Past Medical History  Diagnosis Date  . Diabetes mellitus   . Hypertension   . Depression   . GERD (gastroesophageal reflux disease)   . H/O hiatal hernia   . Neuromuscular disorder     neuropathy  . Sleep apnea     wears cpap at night    Past Surgical History  Procedure Laterality Date  . Toe amputation    . Eye surgery    . No past surgeries    . Fracture surgery      Questionable steel screws to right tibia    Joaquin Courts, RD, LDN, CNSC Pager  [phone] After Hours Pager [phone]

## 2012-08-21 DIAGNOSIS — I10 Essential (primary) hypertension: Secondary | ICD-10-CM

## 2012-08-21 DIAGNOSIS — R45851 Suicidal ideations: Secondary | ICD-10-CM

## 2012-08-21 DIAGNOSIS — F329 Major depressive disorder, single episode, unspecified: Secondary | ICD-10-CM

## 2012-08-21 LAB — GLUCOSE, CAPILLARY: Glucose-Capillary: <10 mg/dL — CL (ref 70–99)

## 2012-08-21 LAB — CULTURE, RESPIRATORY W GRAM STAIN

## 2012-08-21 MED ORDER — DOXYCYCLINE HYCLATE 100 MG IV SOLR
100.0000 mg | Freq: Two times a day (BID) | INTRAVENOUS | Status: DC
  Administered 2012-08-21 – 2012-08-25 (×9): 100 mg via INTRAVENOUS
  Filled 2012-08-21 (×11): qty 100

## 2012-08-21 MED ORDER — HYDRALAZINE HCL 25 MG PO TABS
25.0000 mg | ORAL_TABLET | Freq: Three times a day (TID) | ORAL | Status: DC
  Filled 2012-08-21 (×3): qty 1

## 2012-08-21 MED ORDER — HYDRALAZINE HCL 20 MG/ML IJ SOLN: 10.0000 mg | Freq: Four times a day (QID) | INTRAMUSCULAR | Status: DC | PRN

## 2012-08-21 MED ORDER — GLUCERNA 1.2 CAL PO LIQD
1000.0000 mL | ORAL | Status: DC
  Filled 2012-08-21 (×2): qty 1000

## 2012-08-21 MED ORDER — OSMOLITE 1.2 CAL PO LIQD
1000.0000 mL | ORAL | Status: DC
  Filled 2012-08-21 (×2): qty 1000

## 2012-08-21 MED ORDER — HYDRALAZINE HCL 20 MG/ML IJ SOLN
20.0000 mg | Freq: Four times a day (QID) | INTRAMUSCULAR | Status: DC | PRN
  Administered 2012-08-22 – 2012-08-23 (×2): 20 mg via INTRAVENOUS
  Filled 2012-08-21 (×2): qty 1

## 2012-08-21 MED ORDER — SODIUM CHLORIDE 0.9 % IV SOLN: INTRAVENOUS | Status: DC

## 2012-08-21 MED ORDER — PRO-STAT SUGAR FREE PO LIQD
60.0000 mL | Freq: Four times a day (QID) | ORAL | Status: DC
  Filled 2012-08-21 (×3): qty 60

## 2012-08-21 MED ORDER — INSULIN ASPART 100 UNIT/ML ~~LOC~~ SOLN
0.0000 [IU] | SUBCUTANEOUS | Status: DC
  Administered 2012-08-22 (×2): 3 [IU] via SUBCUTANEOUS
  Administered 2012-08-22 (×4): 5 [IU] via SUBCUTANEOUS
  Administered 2012-08-23: 3 [IU] via SUBCUTANEOUS
  Administered 2012-08-23: 5 [IU] via SUBCUTANEOUS
  Administered 2012-08-23: 8 [IU] via SUBCUTANEOUS
  Administered 2012-08-23 (×3): 5 [IU] via SUBCUTANEOUS
  Administered 2012-08-24: 11 [IU] via SUBCUTANEOUS
  Administered 2012-08-24 (×2): 8 [IU] via SUBCUTANEOUS
  Administered 2012-08-24: 11 [IU] via SUBCUTANEOUS
  Administered 2012-08-25: 5 [IU] via SUBCUTANEOUS
  Administered 2012-08-25: 8 [IU] via SUBCUTANEOUS
  Administered 2012-08-25 (×2): 5 [IU] via SUBCUTANEOUS
  Administered 2012-08-25: 11 [IU] via SUBCUTANEOUS
  Administered 2012-08-25: 8 [IU] via SUBCUTANEOUS
  Administered 2012-08-26: 5 [IU] via SUBCUTANEOUS
  Administered 2012-08-26: 8 [IU] via SUBCUTANEOUS
  Administered 2012-08-26: 5 [IU] via SUBCUTANEOUS
  Administered 2012-08-26: 3 [IU] via SUBCUTANEOUS
  Administered 2012-08-26: 5 [IU] via SUBCUTANEOUS
  Administered 2012-08-26 (×2): 8 [IU] via SUBCUTANEOUS
  Administered 2012-08-27 (×2): 5 [IU] via SUBCUTANEOUS
  Administered 2012-08-27: 11 [IU] via SUBCUTANEOUS
  Administered 2012-08-27: 5 [IU] via SUBCUTANEOUS
  Administered 2012-08-27: 8 [IU] via SUBCUTANEOUS
  Administered 2012-08-27: 3 [IU] via SUBCUTANEOUS
  Administered 2012-08-28 (×2): 5 [IU] via SUBCUTANEOUS
  Administered 2012-08-28: 3 [IU] via SUBCUTANEOUS
  Administered 2012-08-28: 5 [IU] via SUBCUTANEOUS
  Administered 2012-08-28: 3 [IU] via SUBCUTANEOUS
  Administered 2012-08-29: 8 [IU] via SUBCUTANEOUS
  Administered 2012-08-29: 3 [IU] via SUBCUTANEOUS
  Administered 2012-08-29: 8 [IU] via SUBCUTANEOUS
  Administered 2012-08-29: 3 [IU] via SUBCUTANEOUS
  Administered 2012-08-29: 2 [IU] via SUBCUTANEOUS

## 2012-08-21 MED ORDER — CIPROFLOXACIN IN D5W 400 MG/200ML IV SOLN
400.0000 mg | Freq: Two times a day (BID) | INTRAVENOUS | Status: DC
  Administered 2012-08-21 – 2012-08-25 (×8): 400 mg via INTRAVENOUS
  Filled 2012-08-21 (×9): qty 200

## 2012-08-21 NOTE — Progress Notes (Addendum)
PULMONARY  / CRITICAL CARE MEDICINE  Name: Thomas Frye MRN: [id number] DOB: 01-11-1956    ADMISSION DATE:  08/18/2012    REFERRING MD : EDP PRIMARY SERVICE: PCCM  CHIEF COMPLAINT:  VDRF  BRIEF PATIENT DESCRIPTION:  57 yo Wm found unresponsive in his care. Found to be profoundly hypoglycemic and unresponsive in his car with insulin and needles.  SIGNIFICANT EVENTS / STUDIES:  5-24 hypogl 5-24 ct head rt thalamic infarcts  LINES / TUBES: 5-24 OTT(EDP)>> 5-24 Lt i j cvl>>  CULTURES: 5-24 rt foot>>  ANTIBIOTICS: 5-24 ancef>>5/25 Zosyn 5/25>>  SUBJECTIVE:  weaning  VITAL SIGNS: Temp:  [92.2 F (33.4 C)-99.3 F (37.4 C)] 98.6 F (37 C) (05/27 0900) Pulse Rate:  [81-94] 86 (05/27 0900) Resp:  [13-20] 16 (05/27 0900) BP: (123-167)/(47-75) 149/69 mmHg (05/27 0900) SpO2:  [99 %-100 %] 99 % (05/27 0900) FiO2 (%):  [40 %] 40 % (05/27 0900) Weight:  [273 lb 5.9 oz (124 kg)] 273 lb 5.9 oz (124 kg) (05/27 0500) HEMODYNAMICS: CV stable   VENTILATOR SETTINGS: Vent Mode:  [-] CPAP;PSV FiO2 (%):  [40 %] 40 % Set Rate:  [14 bmp] 14 bmp Vt Set:  [650 mL] 650 mL PEEP:  [5 cmH20] 5 cmH20 Pressure Support:  [8 cmH20] 8 cmH20 Plateau Pressure:  [15 cmH20-18 cmH20] 16 cmH20 INTAKE / OUTPUT: Intake/Output     05/26 0701 - 05/27 0700 05/27 0701 - 05/28 0700   I.V. (mL/kg) 610 (4.9) 25 (0.2)   NG/GT     IV Piggyback 150    Total Intake(mL/kg) 760 (6.1) 25 (0.2)   Urine (mL/kg/hr) 2600 (0.9) 225 (0.6)   Emesis/NG output 600 (0.2)    Total Output 3200 225   Net -2440 -200          PHYSICAL EXAMINATION: General: vented calm Neuro:  More alert, nods to commands HEENT:  ett wnl Cardiovascular: s1 s2 RRR Lungs:  Clearer Abdomen:  Obese +bs Musculoskeletal:  Multiple missing toes. Rt foot ulcer with purulent drainage Skin:  warm  LABS:  Recent Labs Lab 08/18/12 1024 08/18/12 1036 08/18/12 1047 08/18/12 1059 08/18/12 1206 08/18/12 1227 08/18/12 1400  08/19/12 0430 08/19/12 0445 08/19/12 0630 08/19/12 2048 08/20/12 0355  HGB 13.5  --   --   --   --   --  11.6* 11.1*  --   --   --  11.1*  WBC 20.9*  --   --   --   --   --  15.5* 11.1*  --   --   --  10.5  PLT 260  --   --   --   --   --  227 212  --   --   --  209  NA 134*  --   --   --   --   --  134* 133*  --   --  126* 130*  K 6.2*  --   --   --   --   --  3.3* 2.9*  --   --  4.4 4.1  CL 97  --   --   --   --   --  100 98  --   --  91* 94*  CO2 26  --   --   --   --   --  26 25  --   --  24 23  GLUCOSE 142*  --   --   --   --   --  128* 70  --   --  445* 367*  BUN 22  --   --   --   --   --  19 13  --   --  18 21  CREATININE 0.66  --   --   --   --   --  0.62 0.90  --   --  1.07 1.14  CALCIUM 9.7  --   --   --   --   --  9.1 9.2  --   --  8.9 8.9  MG  --   --   --   --   --   --  1.8  --   --  1.7  --   --   PHOS  --   --   --   --   --   --  0.7*  --   --  2.7  --   --   AST 36  --   --   --   --   --  44*  --   --   --   --   --   ALT 19  --   --   --   --   --  17  --   --   --   --   --   ALKPHOS 68  --   --   --   --   --  54  --   --   --   --   --   BILITOT 0.3  --   --   --   --   --  0.4  --   --   --   --   --   PROT 7.8  --   --   --   --   --  6.3  --   --   --   --   --   ALBUMIN 3.7  --   --   --   --   --  3.1*  --   --   --   --   --   APTT  --   --   --   --   --   --  29  --   --   --   --   --   INR  --   --   --  0.87  --   --  0.89  --   --   --   --   --   LATICACIDVEN  --   --   --   --  1.0  --   --   --   --   --   --   --   TROPONINI  --  <0.30  --   --   --   --   --   --   --   --   --   --   PROCALCITON  --   --   --   --  <0.10  --   --   --   --   --   --   --   PHART  --   --  7.147*  --   --  7.379  --   --  7.554*  --   --   --   PCO2ART  --   --  72.4*  --   --  43.7  --   --  28.3*  --   --   --  PO2ART  --   --  139.0*  --   --  87.0  --   --  114.0*  --   --   --     Recent Labs Lab 08/20/12 0404 08/20/12 0746 08/20/12 1158  08/20/12 1534 08/20/12 2020  GLUCAP 350* 317* 307* 247* 260*    Imaging: Dg Chest Port 1 View  08/20/2012   *RADIOLOGY REPORT*  Clinical Data: Endotracheal tube placement  PORTABLE CHEST - 1 VIEW  Comparison: 08/19/2012; 08/18/2012; 10/13/2010  Findings: Grossly unchanged cardiac silhouette and mediastinal contours.  Stable position of support apparatus.  Grossly unchanged minimal bibasilar heterogeneous opacities, left greater than right. No definite pleural effusion.  No pneumothorax.  Unchanged bones.  IMPRESSION: 1.  Stable positioning of support apparatus.  No pneumothorax. 2.  Grossly unchanged bibasilar atelectasis, left greater than right.   Original Report Authenticated By: Tacey Ruiz, MD   Dg Chest Port 1 View  08/19/2012   *RADIOLOGY REPORT*  Clinical Data: Endotracheal tube placement  PORTABLE CHEST - 1 VIEW  Comparison: 08/18/2012  Findings: Endotracheal tube terminates 5 cm above the carina.  Stable left IJ venous catheter and enteric tube.  Lungs are essentially clear.  No focal consolidation.  No pleural effusion or pneumothorax.  Heart is top normal in size.  IMPRESSION: Endotracheal tube terminates 5 cm above the carina.   Original Report Authenticated By: Charline Bills, M.D.     ASSESSMENT / PLAN:  PULMONARY A:VDRF secondary to ams and hypoglycemia. Neuro is rate limit step P:   -abg reviewed, ensure Mv had been reduced -weaning this am cpap5 ps 5, goal 1 hr -eval strength on vent, cough, gag  CARDIOVASCULAR A: HTN P:  Treat HTN No home acei see crt Add hydralazine 25 q8h Avoid beta blocker with dm,. Glu issues Add IV prn hydralzine  RENAL Lab Results  Component Value Date   CREATININE 1.14 08/20/2012   CREATININE 1.07 08/19/2012   CREATININE 0.90 08/19/2012    A: hyponatremia P:   Monitor na in am  Dc 1/2 NS Add saline  GASTROINTESTINAL A:  GI Protection, emesis so cannot start TF P:   PPI Tube feeds start if not extubated  HEMATOLOGIC Lab  Results  Component Value Date   INR 0.89 08/18/2012   INR 0.87 08/18/2012    A: dvt prevention P:  Sub q hep coags wnl  INFECTIOUS A:  Cellulitis rt lower foot followed [facility] Dr. Marco Collie P:   Culture foot Abx as needed Change foot dressing pcxr neg, return to prior cipro, doxy Dc zosyn  ENDOCRINE A:  Hypoglycemia(? Insulin OD) DM  P:   Dc d in fluids as glu greater 200/s SSI  NEUROLOGIC A:  Ams, ? Seizure, hypolycemic , metabolic encephalopathy. ? Intentional or unintentional insuli overdose is a consideration. P:   Ct reviewed Slow progress Suicide precautions Psych consult  TODAY'S SUMMARY: wean ps 8 to goal 5, add TF, add saline, follow nA  Cc time 30 min  Mcarthur Rossetti. Tyson Alias, MD, FACP Pgr: [phone] [facility]

## 2012-08-21 NOTE — Procedures (Signed)
Extubation Procedure Note  Patient Details:   Name: Thomas Frye DOB: 02-21-1956 MRN: [id number]   Airway Documentation:  Airway 7.5 mm (Active)  Secured at (cm) 27 cm 08/21/2012  8:06 AM  Measured From Lips 08/21/2012  8:06 AM  Secured Location Left 08/21/2012  8:06 AM  Secured By [employer] 08/21/2012  8:06 AM  Tube Holder Repositioned Yes 08/21/2012  8:06 AM  Cuff Pressure (cm H2O) 26 cm H2O 08/21/2012  3:30 AM  Site Condition Dry 08/21/2012  8:06 AM    Evaluation  O2 sats: stable throughout Complications: No apparent complications Patient did tolerate procedure well. Bilateral Breath Sounds: Rhonchi Suctioning: Airway Yes PT extubated to a 4lpm, Sp02 100%, rr 21, Will continue monitor pt progress.   Melanee Spry 08/21/2012, 10:41 AM

## 2012-08-21 NOTE — Progress Notes (Signed)
Hyperglycemia   SSI changed from sensitive to moderate

## 2012-08-21 NOTE — Care Management Note (Signed)
    Page 1 of 1   08/21/2012     10:19:28 AM   CARE MANAGEMENT NOTE 08/21/2012  Patient:  Thomas Frye, Thomas Frye   Account Number:  [id number]  Date Initiated:  08/21/2012  Documentation initiated by:  Junius Creamer  Subjective/Objective Assessment:   adm w resp failure, on vent     Action/Plan:   lives w wife, pcp dr Renae Fickle [facility]   Anticipated DC Date:     Anticipated DC Plan:        DC Planning Services  CM consult      Choice offered to / List presented to:             Status of service:   Medicare Important Message given?   (If response is "NO", the following Medicare IM given date fields will be blank) Date Medicare IM given:   Date Additional Medicare IM given:    Discharge Disposition:    Per UR Regulation:  Reviewed for med. necessity/level of care/duration of stay  If discussed at Long Length of Stay Meetings, dates discussed:    Comments:

## 2012-08-21 NOTE — Progress Notes (Signed)
NUTRITION FOLLOW UP  Intervention:   Initiate TF via OGT with Glucerna 1.2 at 20 ml/h, increase by 10 ml every 4 hours to goal rate of 50 ml/h with Prostat 60 ml QID to provide 2240 kcals (24.4 kcals/kg ideal weight), 192 gm protein, 966 ml free water daily.  Nutrition Dx:   Inadequate oral intake related to inability to eat as evidenced by NPO status. Ongoing.  Goal:   Enteral nutrition to provide 60-70% of estimated calorie needs (22-25 kcals/kg ideal body weight) and 100% of estimated protein needs, based on ASPEN guidelines for permissive underfeeding in critically ill obese individuals. Unmet.  Monitor:   TF tolerance/adequacy, weight trend, labs, vent status.  Assessment:   Patient remains intubated on ventilator support. Received MD Consult for TF initiation and management. MV: 9.3 Temp:Temp (24hrs), Avg:97.6 F (36.4 C), Min:92.2 F (33.4 C), Max:99.3 F (37.4 C)   Height: Ht Readings from Last 1 Encounters:  08/18/12 6\' 4"  (1.93 m)    Weight Status:   Wt Readings from Last 1 Encounters:  08/21/12 273 lb 5.9 oz (124 kg)  08/20/12  289 lb 3.9 oz (131.2 kg)  Weight trending down with negative fluid status.  Body mass index is 33.29 kg/(m^2). 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 Wound is the result of a contact cast. Was planning for skin graft on 5/30.  Diet Order: NPO   Intake/Output Summary (Last 24 hours) at 08/21/12 1121 Last data filed at 08/21/12 0800  Gross per 24 hour  Intake    635 ml  Output   2275 ml  Net  -1640 ml    Last BM: 5/26   Labs:   Recent Labs Lab 08/18/12 1024 08/18/12 1400 08/19/12 0430 08/19/12 0630 08/19/12 2048 08/20/12 0355  NA 134* 134* 133*  --  126* 130*  K 6.2* 3.3* 2.9*  --  4.4 4.1  CL 97 100 98  --  91* 94*  CO2 26 26 25   --  24 23  BUN 22 19 13   --  18 21  CREATININE 0.66 0.62 0.90  --  1.07 1.14   CALCIUM 9.7 9.1 9.2  --  8.9 8.9  MG  --  1.8  --  1.7  --   --   PHOS  --  0.7*  --  2.7  --   --   GLUCOSE 142* 128* 70  --  445* 367*    CBG (last 3)   Recent Labs  08/20/12 1158 08/20/12 1534 08/20/12 2020  GLUCAP 307* 247* 260*    Scheduled Meds: . antiseptic oral rinse  15 mL Mouth Rinse QID  . chlorhexidine  15 mL Mouth Rinse BID  . ciprofloxacin  400 mg Intravenous Q12H  . doxycycline (VIBRAMYCIN) IV  100 mg Intravenous Q12H  . feeding supplement (OSMOLITE 1.2 CAL)  1,000 mL Per Tube Q24H  . heparin subcutaneous  5,000 Units Subcutaneous Q8H  . hydrALAZINE  25 mg Oral Q8H  . insulin aspart  0-9 Units Subcutaneous Q4H  . pantoprazole (PROTONIX) IV  40 mg Intravenous QHS  . sodium chloride  10-40 mL Intracatheter Q12H    Continuous Infusions: . sodium chloride    . fentaNYL infusion INTRAVENOUS Stopped (08/20/12 0700)  . propofol Stopped (08/19/12 1900)    Joaquin Courts, RD, LDN, CNSC Pager [phone] After Hours Pager [phone]

## 2012-08-21 NOTE — Progress Notes (Signed)
Small amount of purulent drainage and blood from penis when foley removed. Will continue to monitor.

## 2012-08-21 NOTE — Progress Notes (Signed)
Wife took wallet and car keys home.

## 2012-08-21 NOTE — Progress Notes (Signed)
Inpatient Diabetes Program Recommendations  AACE/ADA: New Consensus Statement on Inpatient Glycemic Control (2013)  Target Ranges:  Prepandial:   less than 140 mg/dL      Peak postprandial:   less than 180 mg/dL (1-2 hours)      Critically ill patients:  140 - 180 mg/dL   Reason for Assessment: Profound hypoglycemia (possible insulin overdose) on admission and now hyperglycemia  Note: When Glucerna tube feeding is at goal rate of 50 ml/hour, please consider adding tube feeding Novolog 2 units every 4 hours in addition to correction scale to cover the CHO in the tube feeding.  Thank you.  Renell Coaxum S. Elsie Lincoln, RN, CNS, CDE Inpatient Diabetes Program, team pager [phone]

## 2012-08-21 NOTE — Consult Note (Signed)
Reason for Consult: hypoglycemic, unresponsive in his car and questionable depression and SI Referring Physician: Dr. Rosalva Ferron GAYLORD SEYDEL is an 57 y.o. male.  HPI: Patient was seen and chart reviewed. Patient was initially found unresponsive in his car and found to be profoundly hypoglycemic and unresponsive. Patient was initially required intubation but currently he was extubated. Patient was drowsy and sleepy and could not wake up with the verbal stimuli. Patient staff  RN came and wake him up by shaking on his hand. He did not verbalize any problems but continue stay awake and nodding his head, mostly ambiguously. Reportedly patient was separated from his wife and continued to have a good communication with his wife and also the 4 children who are 66-45 years old. Patient has been following up with the psychiatrist and as a Veterinary surgeon at [facility]. Patient reported that he has an augmented with his mother and go off in his car and later found in [place], [place] in a parking lot. Reportedly patient has a history of for the second attempt several years ago. Patient wife has contacted patient individual counselor and found out the psychologist was surprised to hear about his current condition.  He has a space boot on rt ankle and a rt foot silver dollar wound has purulent drainage and has obviously been treated by [facility] type facility.   Mental Status Examination: Patient is a well-developed well-nourished young Caucasian male who has in shaven beard decreased psychomotor activity limit communication secondary to his condition. Patient has endorses depression and anxiety. Patient's speech was limited because of his medical condition and recent extubation. Patient has no evidence of auditory or visual hallucinations, delusions, and paranoia. Patient has poor insight, judgment and impulse control.  Past Medical History  Diagnosis Date  . Diabetes mellitus   . Hypertension   . Depression   .  GERD (gastroesophageal reflux disease)   . H/O hiatal hernia   . Neuromuscular disorder     neuropathy  . Sleep apnea     wears cpap at night    Past Surgical History  Procedure Laterality Date  . Toe amputation    . Eye surgery    . No past surgeries    . Fracture surgery      Questionable steel screws to right tibia    History reviewed. No pertinent family history.  Social History:  reports that he has never smoked. He does not have any smokeless tobacco history on file. He reports that he does not use illicit drugs. His alcohol history is not on file.  Allergies:  Allergies  Allergen Reactions  . Latex Swelling  . Sulfa Antibiotics Hives    Medications: I have reviewed the patient's current medications.  Results for orders placed during the hospital encounter of 08/18/12 (from the past 48 hour(s))  GLUCOSE, CAPILLARY     Status: Abnormal   Collection Time    08/19/12  1:32 PM      Result Value Range   Glucose-Capillary 192 (*) 70 - 99 mg/dL  GLUCOSE, CAPILLARY     Status: Abnormal   Collection Time    08/19/12  2:40 PM      Result Value Range   Glucose-Capillary 241 (*) 70 - 99 mg/dL  GLUCOSE, CAPILLARY     Status: Abnormal   Collection Time    08/19/12  3:18 PM      Result Value Range   Glucose-Capillary 309 (*) 70 - 99 mg/dL  GLUCOSE, CAPILLARY  Status: Abnormal   Collection Time    08/19/12  4:38 PM      Result Value Range   Glucose-Capillary 346 (*) 70 - 99 mg/dL  GLUCOSE, CAPILLARY     Status: Abnormal   Collection Time    08/19/12  7:54 PM      Result Value Range   Glucose-Capillary 422 (*) 70 - 99 mg/dL   Comment 1 Notify RN    BASIC METABOLIC PANEL     Status: Abnormal   Collection Time    08/19/12  8:48 PM      Result Value Range   Sodium 126 (*) 135 - 145 mEq/L   Comment: DELTA CHECK NOTED   Potassium 4.4  3.5 - 5.1 mEq/L   Comment: DELTA CHECK NOTED   Chloride 91 (*) 96 - 112 mEq/L   CO2 24  19 - 32 mEq/L   Glucose, Bld 445 (*) 70 -  99 mg/dL   BUN 18  6 - 23 mg/dL   Creatinine, Ser 1.61  0.50 - 1.35 mg/dL   Calcium 8.9  8.4 - 09.6 mg/dL   GFR calc non Af Amer 77 (*) >90 mL/min   GFR calc Af Amer 89 (*) >90 mL/min   Comment:            The eGFR has been calculated     using the CKD EPI equation.     This calculation has not been     validated in all clinical     situations.     eGFR's persistently     <90 mL/min signify     possible Chronic Kidney Disease.  GLUCOSE, CAPILLARY     Status: Abnormal   Collection Time    08/19/12 11:49 PM      Result Value Range   Glucose-Capillary 359 (*) 70 - 99 mg/dL  CBC     Status: Abnormal   Collection Time    08/20/12  3:55 AM      Result Value Range   WBC 10.5  4.0 - 10.5 K/uL   RBC 3.43 (*) 4.22 - 5.81 MIL/uL   Hemoglobin 11.1 (*) 13.0 - 17.0 g/dL   HCT 04.5 (*) 40.9 - 81.1 %   MCV 92.1  78.0 - 100.0 fL   MCH 32.4  26.0 - 34.0 pg   MCHC 35.1  30.0 - 36.0 g/dL   RDW 91.4  78.2 - 95.6 %   Platelets 209  150 - 400 K/uL  BASIC METABOLIC PANEL     Status: Abnormal   Collection Time    08/20/12  3:55 AM      Result Value Range   Sodium 130 (*) 135 - 145 mEq/L   Potassium 4.1  3.5 - 5.1 mEq/L   Chloride 94 (*) 96 - 112 mEq/L   CO2 23  19 - 32 mEq/L   Glucose, Bld 367 (*) 70 - 99 mg/dL   BUN 21  6 - 23 mg/dL   Creatinine, Ser 2.13  0.50 - 1.35 mg/dL   Calcium 8.9  8.4 - 08.6 mg/dL   GFR calc non Af Amer 71 (*) >90 mL/min   GFR calc Af Amer 83 (*) >90 mL/min   Comment:            The eGFR has been calculated     using the CKD EPI equation.     This calculation has not been     validated in all clinical  situations.     eGFR's persistently     <90 mL/min signify     possible Chronic Kidney Disease.  GLUCOSE, CAPILLARY     Status: Abnormal   Collection Time    08/20/12  4:04 AM      Result Value Range   Glucose-Capillary 350 (*) 70 - 99 mg/dL   Comment 1 Notify RN    GLUCOSE, CAPILLARY     Status: Abnormal   Collection Time    08/20/12  7:46 AM       Result Value Range   Glucose-Capillary 317 (*) 70 - 99 mg/dL   Comment 1 Documented in Chart     Comment 2 Notify RN    GLUCOSE, CAPILLARY     Status: Abnormal   Collection Time    08/20/12 11:58 AM      Result Value Range   Glucose-Capillary 307 (*) 70 - 99 mg/dL  GLUCOSE, CAPILLARY     Status: Abnormal   Collection Time    08/20/12  3:34 PM      Result Value Range   Glucose-Capillary 247 (*) 70 - 99 mg/dL   Comment 1 Documented in Chart     Comment 2 Notify RN    GLUCOSE, CAPILLARY     Status: Abnormal   Collection Time    08/20/12  8:20 PM      Result Value Range   Glucose-Capillary 260 (*) 70 - 99 mg/dL  GLUCOSE, CAPILLARY     Status: Abnormal   Collection Time    08/21/12 11:54 AM      Result Value Range   Glucose-Capillary 227 (*) 70 - 99 mg/dL    Dg Chest Port 1 View  08/20/2012   *RADIOLOGY REPORT*  Clinical Data: Endotracheal tube placement  PORTABLE CHEST - 1 VIEW  Comparison: 08/19/2012; 08/18/2012; 10/13/2010  Findings: Grossly unchanged cardiac silhouette and mediastinal contours.  Stable position of support apparatus.  Grossly unchanged minimal bibasilar heterogeneous opacities, left greater than right. No definite pleural effusion.  No pneumothorax.  Unchanged bones.  IMPRESSION: 1.  Stable positioning of support apparatus.  No pneumothorax. 2.  Grossly unchanged bibasilar atelectasis, left greater than right.   Original Report Authenticated By: Tacey Ruiz, MD    Positive for anxiety, bad mood, depression and relationship problems Blood pressure 149/69, pulse 86, temperature 98.6 F (37 C), temperature source Oral, resp. rate 16, height 6\' 4"  (1.93 m), weight 273 lb 5.9 oz (124 kg), SpO2 99.00%.   Assessment/Plan: Depression and suicidal ideations  Recommendation:  #1. Continue current psychiatric medication with no changes #2. Continue sitter for safety #3. Referred to psych social service for appropriate placement needs when he was medically cleared #4.  Patient benefit from the inpatient acute psychiatric hospitalization  #5. Appreciate psychiatric consultation and followup as needed  Joshlyn Beadle,JANARDHAHA R. 08/21/2012, 11:59 AM

## 2012-08-22 LAB — GLUCOSE, CAPILLARY
Glucose-Capillary: 248 mg/dL — ABNORMAL HIGH (ref 70–99)
Glucose-Capillary: 209 mg/dL — ABNORMAL HIGH (ref 70–99)

## 2012-08-22 LAB — URINE MICROSCOPIC-ADD ON

## 2012-08-22 LAB — COMPREHENSIVE METABOLIC PANEL
ALT: 24 U/L (ref 0–53)
AST: 26 U/L (ref 0–37)
Alkaline Phosphatase: 68 U/L (ref 39–117)
CO2: 24 mEq/L (ref 19–32)
Chloride: 100 mEq/L (ref 96–112)
GFR calc non Af Amer: >90 mL/min (ref >90)
Sodium: 139 mEq/L (ref 135–145)
Total Bilirubin: 0.6 mg/dL (ref 0.3–1.2)

## 2012-08-22 LAB — URINALYSIS, ROUTINE W REFLEX MICROSCOPIC
Bilirubin Urine: NEGATIVE
Glucose, UA: >1000 mg/dL — AB
Protein, ur: NEGATIVE mg/dL
Specific Gravity, Urine: 1.035 — ABNORMAL HIGH (ref 1.005–1.030)

## 2012-08-22 LAB — CBC WITH DIFFERENTIAL/PLATELET
Basophils Relative: 1 % (ref 0–1)
Eosinophils Absolute: 0.1 10*3/uL (ref 0.0–0.7)
MCH: 31.9 pg (ref 26.0–34.0)
MCHC: 33.5 g/dL (ref 30.0–36.0)
Monocytes Relative: 10 % (ref 3–12)
Neutrophils Relative %: 75 % (ref 43–77)
Platelets: 231 10*3/uL (ref 150–400)

## 2012-08-22 MED ORDER — GLUCERNA 1.2 CAL PO LIQD
1000.0000 mL | ORAL | Status: DC
  Filled 2012-08-22 (×3): qty 1000

## 2012-08-22 MED ORDER — PRO-STAT SUGAR FREE PO LIQD
60.0000 mL | Freq: Four times a day (QID) | ORAL | Status: DC
  Filled 2012-08-22 (×8): qty 60

## 2012-08-22 NOTE — Progress Notes (Signed)
This nurse calls [facility] and spoke to Maralyn Sago regarding patient refusing NG tube placement. Maralyn Sago informs this nurse that she will inform Dr. Vassie Loll

## 2012-08-22 NOTE — Progress Notes (Signed)
Subjective: Somewhat improved compared to overnight per nursing, more interactive.   Exam: Filed Vitals:   08/22/12 0802  BP:   Pulse:   Temp: 98.9 F (37.2 C)  Resp:    Gen: In bed, NAD MS: Awake, Alert, oriented to person, place, and year. Follows commands. Increased latency of response.  CN:VFF, EOMI Motor: moves all extremities equally Sensory:endorses symmetric LT sensation.   Impression: 57 yo M with previous CVA and AMS following hypoglycemia. CT changes likely represent chronic infarct, though the thalamic lesion s could be new based on imaging, I do not feel it would describe his overall presentation. 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 Though there was question of seizure, in the setting of hypoglycemia, this would be provoked and would not start AED therapy.   His overall prognosis for improvement is difficult to predict with certainty, but the fact that he is already having some improvement is encouraging.   Recommendations: 1) Consider ST cognitive evaluation.  2) No other specific recommendations at this time.   [name], MD [facility] [phone]  If 7pm- 7am, please page neurology on call at [phone].

## 2012-08-22 NOTE — Progress Notes (Signed)
PULMONARY  / CRITICAL CARE MEDICINE  Name: Thomas Frye MRN: [id number] DOB: 05-Jun-1955    ADMISSION DATE:  08/18/2012    REFERRING MD : EDP PRIMARY SERVICE: PCCM  CHIEF COMPLAINT:  VDRF  BRIEF PATIENT DESCRIPTION:  57 yo Wm found unresponsive in his care. Found to be profoundly hypoglycemic and unresponsive in his car with insulin and needles.  SIGNIFICANT EVENTS / STUDIES:  5-24 hypogl 5-24 ct head rt thalamic infarcts  LINES / TUBES: 5-24 OTT(EDP)>> 5-24 Lt i j cvl>>  CULTURES: 5-24 rt foot>>  ANTIBIOTICS: 5-24 ancef>>5/25 Zosyn 5/25>>5/27 cipr / doxy (chronic from home)>>>  SUBJECTIVE:  Extubated, no distress, speaking now  VITAL SIGNS: Temp:  [90.7 F (32.6 C)-99.8 F (37.7 C)] 98.9 F (37.2 C) (05/28 0802) Pulse Rate:  [82-94] 83 (05/28 0800) Resp:  [16-25] 22 (05/28 0800) BP: (118-157)/(41-70) 135/62 mmHg (05/28 0800) SpO2:  [89 %-100 %] 97 % (05/28 0800) Weight:  [278 lb 10.6 oz (126.4 kg)] 278 lb 10.6 oz (126.4 kg) (05/28 0500) HEMODYNAMICS: CV stable   VENTILATOR SETTINGS:   INTAKE / OUTPUT: Intake/Output     05/27 0701 - 05/28 0700 05/28 0701 - 05/29 0700   I.V. (mL/kg) 1025 (8.1) 50 (0.4)   IV Piggyback 900    Total Intake(mL/kg) 1925 (15.2) 50 (0.4)   Urine (mL/kg/hr) 1625 (0.5) 150 (0.5)   Emesis/NG output 500 (0.2)    Total Output 2125 150   Net -200 -100        Stool Occurrence 2 x      PHYSICAL EXAMINATION: General: no distress Neuro:  More alert, speaking simple statements HEENT:  ett wnl Cardiovascular: s1 s2 RRR Lungs:  Clear Abdomen:  Obese +bs Musculoskeletal:  Multiple missing toes. Rt foot ulcer with purulent drainage Skin:  warm  LABS:  Recent Labs Lab 08/18/12 1024 08/18/12 1036 08/18/12 1047 08/18/12 1059 08/18/12 1206 08/18/12 1227 08/18/12 1400 08/19/12 0430 08/19/12 0445 08/19/12 0630 08/19/12 2048 08/20/12 0355 08/22/12 0500  HGB 13.5  --   --   --   --   --  11.6* 11.1*  --   --   --  11.1*  10.9*  WBC 20.9*  --   --   --   --   --  15.5* 11.1*  --   --   --  10.5 10.7*  PLT 260  --   --   --   --   --  227 212  --   --   --  209 231  NA 134*  --   --   --   --   --  134* 133*  --   --  126* 130* 139  K 6.2*  --   --   --   --   --  3.3* 2.9*  --   --  4.4 4.1 3.9  CL 97  --   --   --   --   --  100 98  --   --  91* 94* 100  CO2 26  --   --   --   --   --  26 25  --   --  24 23 24   GLUCOSE 142*  --   --   --   --   --  128* 70  --   --  445* 367* 225*  BUN 22  --   --   --   --   --  19 13  --   --  18 21 15   CREATININE 0.66  --   --   --   --   --  0.62 0.90  --   --  1.07 1.14 0.79  CALCIUM 9.7  --   --   --   --   --  9.1 9.2  --   --  8.9 8.9 9.2  MG  --   --   --   --   --   --  1.8  --   --  1.7  --   --   --   PHOS  --   --   --   --   --   --  0.7*  --   --  2.7  --   --   --   AST 36  --   --   --   --   --  44*  --   --   --   --   --  26  ALT 19  --   --   --   --   --  17  --   --   --   --   --  24  ALKPHOS 68  --   --   --   --   --  54  --   --   --   --   --  68  BILITOT 0.3  --   --   --   --   --  0.4  --   --   --   --   --  0.6  PROT 7.8  --   --   --   --   --  6.3  --   --   --   --   --  6.4  ALBUMIN 3.7  --   --   --   --   --  3.1*  --   --   --   --   --  2.7*  APTT  --   --   --   --   --   --  29  --   --   --   --   --   --   INR  --   --   --  0.87  --   --  0.89  --   --   --   --   --   --   LATICACIDVEN  --   --   --   --  1.0  --   --   --   --   --   --   --   --   TROPONINI  --  <0.30  --   --   --   --   --   --   --   --   --   --   --   PROCALCITON  --   --   --   --  <0.10  --   --   --   --   --   --   --   --   PHART  --   --  7.147*  --   --  7.379  --   --  7.554*  --   --   --   --   PCO2ART  --   --  72.4*  --   --  43.7  --   --  28.3*  --   --   --   --  PO2ART  --   --  139.0*  --   --  87.0  --   --  114.0*  --   --   --   --     Recent Labs Lab 08/20/12 2020 08/21/12 1154 08/21/12 1551 08/21/12 1947 08/22/12 0801   GLUCAP 260* 227* 241* 220* 209*    Imaging: No results found.   ASSESSMENT / PLAN:  PULMONARY A:VDRF secondary to ams and hypoglycemia. Neuro is rate limit step, CPAP dependent P:   -ad home cpap  CARDIOVASCULAR A: HTN more controlled P:  Hydralazine prn  RENAL Lab Results  Component Value Date   CREATININE 0.79 08/22/2012   CREATININE 1.14 08/20/2012   CREATININE 1.07 08/19/2012    A: hyponatremia resolving P:   kvo saline now bmet in am   GASTROINTESTINAL A:  GI Protection, r/o dysphagia P:   PPI SLp now  HEMATOLOGIC Lab Results  Component Value Date   INR 0.89 08/18/2012   INR 0.87 08/18/2012    A: dvt prevention P:  Sub q hep, maintain, he is NOT ambulatory coags wnl  INFECTIOUS A:  Cellulitis rt lower foot followed [facility] Dr. Marco Collie P:   Continue home cipro, doxy  ENDOCRINE A:  Hypoglycemia(? Insulin OD) resolved DM  P:   SSI  NEUROLOGIC A:  Likely suicide attempt P:   Psych consult appreciated Sitter 24 hrs needed Neuro re eval may be needed PT / OT  TODAY'S SUMMARY: to [facility], psych seeing   Mcarthur Rossetti. Tyson Alias, MD, FACP Pgr: [phone] [facility]

## 2012-08-22 NOTE — Progress Notes (Signed)
Inpatient Diabetes Program Recommendations  AACE/ADA: New Consensus Statement on Inpatient Glycemic Control (2013)  Target Ranges:  Prepandial:   less than 140 mg/dL      Peak postprandial:   less than 180 mg/dL (1-2 hours)      Critically ill patients:  140 - 180 mg/dL   Results for RUFFUS, KAMAKA (MRN [id number]) as of 08/22/2012 09:48  Ref. Range 08/21/2012 11:54 08/21/2012 15:51 08/21/2012 19:47 08/22/2012 08:01  Glucose-Capillary Latest Range: 70-99 mg/dL 308 (H) 657 (H) 846 (H) 209 (H)    Inpatient Diabetes Program Recommendations Insulin - Basal: Please consider ordering low dose basal insulin.  Recommend starting with Levemir 10 units daily and adjust accordingly to improve inpatient glycemic control.  Note: Patient initially had profound hypoglycemia but blood glucose is now consistently elevated greater than 200 mg/dl.  Over the past 24 hours it has ranged from 209-241 mg/dl.  Note that Novolog correction scale was increased to moderate scale today.  Please consider ordering low dose basal insulin; recommend starting with Levemir 10 units daily.  Will continue to follow.  Thanks, Orlando Penner, RN, MSN, CCRN Diabetes Coordinator Inpatient Diabetes Program [phone]

## 2012-08-22 NOTE — Progress Notes (Signed)
08/22/12 2010  Pt being combative and refusing NG tube placement. AM shift RN attempted x2 with no success. PM shift RN attempted x1 with no success. eLink MD made aware. eLink MD Vassie Loll requested to reassess need in the AM rounding.   Will continue to monitor pt.   Elisha Headland RN

## 2012-08-22 NOTE — Progress Notes (Signed)
Rehab Admissions Coordinator Note:  Patient was screened by Trish Mage for appropriateness for an Inpatient Acute Rehab Consult.  PT and ST recommending CIR, OT consult is pending.   At this time, we are recommending Inpatient Rehab consult.  Trish Mage 08/22/2012, 2:51 PM  I can be reached at [phone].

## 2012-08-22 NOTE — Clinical Social Work Psych Note (Signed)
Psych CSW reviewed chart and Dr. Valora Corporal documentation dated 05/27 11:59am.  Upon being medically cleared, psychiatrist recommendation is: Acute psychiatric hospitalization for crisis management.  Psych CSW will follow and assist with disposition.  Vickii Penna, LCSWA [phone]  Clinical Social Work

## 2012-08-22 NOTE — Evaluation (Signed)
Physical Therapy Evaluation Patient Details Name: Thomas Frye MRN: [id number] DOB: [birth year] Today's Date: 08/22/2012 Time: 1012-1039 PT Time Calculation (min): 27 min  PT Assessment / Plan / Recommendation Clinical Impression  Pt is a 57 y.o. male found unresponsive in his car with hypoglycemic event, limited imaging suggestive of possible Acute infarcts (thalamic). Patient is incredibly lethargic but arousable to stimuli. Patient was able to state he is in "hospital" and his birthdate. Pt was able to follow some one step commands such as lifting right arm to show arm band when instructed and was able to elevate head and engage eyes when prompted although could not sustain.  Performed very limited strength assessment. Patient again limited in following commands but was able to squeeze hands, intitiate elevation of UE and LE.  Patient able to maintain trunk control for apporx 6 seconds before losing postural control.  This appears to be improvements compared to yesterday's activity per wife.  Will continue to follow and work with patient acutely and progress activity as tolerated.  Patient will need skilled therapy upon discharge. If patient continues to progress, will recommend CIR upon discharge.    PT Assessment  Patient needs continued PT services    Follow Up Recommendations  CIR          Equipment Recommendations  Other (comment) (TBD)    Recommendations for Other Services Rehab consult   Frequency Min 3X/week    Precautions / Restrictions Precautions Required Braces or Orthoses:  (space boot on rt ankle)   Pertinent Vitals/Pain NAD      Mobility  Bed Mobility Bed Mobility: Not assessed Details for Bed Mobility Assistance: lifted to chair by nsg prior to arrival Transfers Transfers: Not assessed Details for Transfer Assistance: attempted +2 elevation from chair x2 but patient unresponsive to stimulus of movement and unable to engage in task of  standing Ambulation/Gait Ambulation/Gait Assistance: Not tested (comment) Modified Rankin (Stroke Patients Only) Pre-Morbid Rankin Score: No significant disability Modified Rankin: Severe disability        PT Diagnosis: Generalized weakness;Altered mental status  PT Problem List: Decreased strength;Decreased range of motion;Decreased activity tolerance;Decreased balance;Decreased mobility;Decreased coordination;Decreased cognition PT Treatment Interventions: DME instruction;Gait training;Functional mobility training;Therapeutic activities;Therapeutic exercise;Balance training;Neuromuscular re-education;Cognitive remediation;Patient/family education   PT Goals Acute Rehab PT Goals PT Goal Formulation: Patient unable to participate in goal setting Time For Goal Achievement: 09/05/12 Potential to Achieve Goals: Fair Pt will go Supine/Side to Sit: with min assist PT Goal: Supine/Side to Sit - Progress: Goal set today Pt will Sit at Edge of Bed: with supervision PT Goal: Sit at Edge Of Bed - Progress: Goal set today Pt will go Sit to Stand: with min assist PT Goal: Sit to Stand - Progress: Goal set today Pt will Transfer Bed to Chair/Chair to Bed: with min assist PT Transfer Goal: Bed to Chair/Chair to Bed - Progress: Goal set today  Visit Information  Last PT Received On: 08/22/12 Assistance Needed: +2    Subjective Data  Subjective: Pt nodding but minimal verbilizations Patient Stated Goal: none stated   Prior Functioning  Home Living Lives With: Family (parents but wife (separated) may have him move back with her) Available Help at Discharge: Family Type of Home: House Home Access: Stairs to enter Secretary/administrator of Steps: 4 Entrance Stairs-Rails: Left Home Layout: One level Bathroom Shower/Tub: Engineer, manufacturing systems: Standard Home Adaptive Equipment: None Prior Function Level of Independence: Independent Able to Take Stairs?: Yes Driving:  Yes Vocation: Full time employment  Communication Communication: Expressive difficulties    Cognition  Cognition Arousal/Alertness: Lethargic Overall Cognitive Status: Impaired/Different from baseline Area of Impairment: Orientation;Attention;Following commands Orientation Level: Disoriented to;Time;Situation Current Attention Level: Focused Following Commands: Follows one step commands inconsistently General Comments: Patient able to verbalize her was in hospital and his birthdate but no other vocalizations at all; able to turn right arm over when asked to check arm band and able to look up when asked as well but could not follow other commands consistantly.    Extremity/Trunk Assessment Right Upper Extremity Assessment RUE ROM/Strength/Tone: Deficits;Unable to fully assess;Due to impaired cognition RUE ROM/Strength/Tone Deficits: able to perform some strength assessment but limited by lethargy and focused attention Left Upper Extremity Assessment LUE ROM/Strength/Tone: Deficits;Unable to fully assess;Due to impaired cognition LUE ROM/Strength/Tone Deficits: able to perform some strength assessment but limited by lethargy and focused attention Right Lower Extremity Assessment RLE ROM/Strength/Tone: Deficits;Unable to fully assess;Due to impaired cognition RLE ROM/Strength/Tone Deficits: able to perform some strength assessment but limited by lethargy and focused attention Left Lower Extremity Assessment LLE ROM/Strength/Tone: Deficits;Unable to fully assess;Due to impaired cognition LLE ROM/Strength/Tone Deficits: able to perform some strength assessment but limited by lethargy and focused attention   Balance Balance Balance Assessed: Yes Static Sitting Balance Static Sitting - Balance Support: Feet supported Static Sitting - Level of Assistance: 4: Min assist;3: Mod assist Static Sitting - Comment/# of Minutes: patient able to maintain balance breifly but once focus lost patient  unable to control movements and falls back into chair  Dynamic Sitting Balance Dynamic Sitting - Balance Support: Feet supported Dynamic Sitting - Level of Assistance: 3: Mod assist Dynamic Sitting - Comments: attmepted postural activities and perturbations. Patient easily shifted and unable to make any self corrections at this time.   End of Session PT - End of Session Equipment Utilized During Treatment: Oxygen Activity Tolerance: Patient limited by fatigue;Other (comment) (Cognitive limitations, extreme lethargy) Patient left: in chair;with call bell/phone within reach;with nursing in room;with family/visitor present Nurse Communication: Need for lift equipment  GP     Fabio Asa 08/22/2012, 2:17 PM Charlotte Crumb, PT DPT  [phone]

## 2012-08-22 NOTE — Evaluation (Signed)
Clinical/Bedside Swallow Evaluation Patient Details  Name: Thomas Frye MRN: [id number] Date of Birth: Aug 25, 1955  Today's Date: 08/22/2012 Time: 0920-1004 SLP Time Calculation (min): 44 min  Past Medical History:  Past Medical History  Diagnosis Date  . Diabetes mellitus   . Hypertension   . Depression   . GERD (gastroesophageal reflux disease)   . H/O hiatal hernia   . Neuromuscular disorder     neuropathy  . Sleep apnea     wears cpap at night   Past Surgical History:  Past Surgical History  Procedure Laterality Date  . Toe amputation    . Eye surgery    . No past surgeries    . Fracture surgery      Questionable steel screws to right tibia   HPI:  57 yo Wm found unresponsive in his care. Found to be profoundly hypoglycemic and unresponsive in his car with insulin and needles. Apparent suicide attempt. Pt intubated from 5/24-5/27. 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    Assessment / Plan / Recommendation Clinical Impression  Pt is awake but lethargic with poorly focused attention. Despite max contextual cues and self feeding with assist, pt is not fully aware of PO trials and does not open mouth appropriately for trials or lets liquids fall from mouth. In 2 attempts pt did have a swallow response with thin and puree trials, but a delayed cough indicates probable aspiration. Do not suspect a true oral or oropharyngeal dysphagia, deficits are primarily cognitive. Hopeful for progression as pts attention improves. Recommend NPO with PO trials.     Aspiration Risk  Moderate    Diet Recommendation NPO        Other  Recommendations Oral Care Recommendations: Oral care QID   Follow Up Recommendations  Inpatient Rehab    Frequency and Duration min 2x/week  2 weeks   Pertinent Vitals/Pain NA    SLP Swallow Goals Goal #3: Pt will demonstrate awareness of PO trials with automatic transit and swallow response with  therapist choice of consistencies with max contextual cues.  Swallow Study Goal #3 - Progress: Progressing toward goal   Swallow Study Prior Functional Status  Cognitive/Linguistic Baseline: Within functional limits    General HPI: 57 yo Wm found unresponsive in his care. Found to be profoundly hypoglycemic and unresponsive in his car with insulin and needles. Apparent suicide attempt. Pt intubated from 5/24-5/27. 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  Type of Study: Bedside swallow evaluation Diet Prior to this Study: NPO Temperature Spikes Noted: No Respiratory Status: Room air History of Recent Intubation: Yes Length of Intubations (days): 3 days Date extubated: 08/21/12 Behavior/Cognition: Lethargic;Agitated;Alert Oral Cavity - Dentition: Adequate natural dentition Self-Feeding Abilities: Able to feed self Patient Positioning: Upright in chair Baseline Vocal Quality: Clear Volitional Cough: Cognitively unable to elicit Volitional Swallow: Able to elicit    Oral/Motor/Sensory Function Overall Oral Motor/Sensory Function: Impaired Labial ROM: Reduced left Labial Symmetry: Abnormal symmetry left Labial Strength: Reduced Labial Sensation:  (UTA) Lingual ROM: Within Functional Limits Lingual Symmetry: Within Functional Limits Lingual Strength: Reduced (suspect mild lingual weakness on left. ) Facial ROM: Reduced left Facial Symmetry: Left droop Facial Strength: Reduced   Ice Chips Ice chips: Impaired Presentation: Spoon Oral Phase Impairments: Poor awareness of bolus;Reduced lingual movement/coordination;Impaired anterior to posterior transit Pharyngeal Phase Impairments: Suspected delayed Swallow   Thin Liquid Thin Liquid: Impaired Presentation: Cup;Spoon;Straw Oral Phase Impairments:  Reduced labial seal;Reduced lingual movement/coordination;Impaired anterior to posterior transit;Poor awareness of bolus Oral Phase Functional  Implications: Right anterior spillage;Left anterior spillage;Prolonged oral transit Pharyngeal  Phase Impairments: Suspected delayed Swallow;Cough - Immediate;Cough - Delayed;Throat Clearing - Immediate    Nectar Thick Nectar Thick Liquid: Impaired Presentation: Cup;Spoon Oral Phase Impairments: Reduced labial seal;Reduced lingual movement/coordination;Impaired anterior to posterior transit;Poor awareness of bolus Oral phase functional implications: Right anterior spillage;Left anterior spillage;Prolonged oral transit   Honey Thick Honey Thick Liquid: Not tested   Puree     Solid   GO           Harlon Ditty, MA CCC-SLP [phone]  Levaeh Vice, Riley Nearing 08/22/2012,10:42 AM

## 2012-08-22 NOTE — Evaluation (Signed)
Speech Language Pathology Evaluation Patient Details Name: Thomas Frye MRN: [id number] DOB: Jan 14, 1956 Today's Date: 08/22/2012 Time: 0920-1004 SLP Time Calculation (min): 44 min  Problem List:  Patient Active Problem List   Diagnosis Date Noted  . Leg ulcer, left 08/19/2012  . Respiratory failure 08/18/2012  . Hypoglycemia 08/18/2012  . Obtunded 08/18/2012  . Seizure 08/18/2012  . DM (diabetes mellitus), type 2 with peripheral vascular complications 08/18/2012  . Vascular disease 08/18/2012  . Altered mental status 08/18/2012  . HTN (hypertension) 08/18/2012  . Noncompliance 08/18/2012   Past Medical History:  Past Medical History  Diagnosis Date  . Diabetes mellitus   . Hypertension   . Depression   . GERD (gastroesophageal reflux disease)   . H/O hiatal hernia   . Neuromuscular disorder     neuropathy  . Sleep apnea     wears cpap at night   Past Surgical History:  Past Surgical History  Procedure Laterality Date  . Toe amputation    . Eye surgery    . No past surgeries    . Fracture surgery      Questionable steel screws to right tibia   HPI:  57 yo Wm found unresponsive in his care. Found to be profoundly hypoglycemic and unresponsive in his car with insulin and needles. Apparent suicide attempt. Pt intubated from 5/24-5/27. 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    Assessment / Plan / Recommendation Clinical Impression  Pt presents with a mild left oral weakness without significant dysarthria, primary cognitive impairment with intermittently focused attention with max cues. This impacts pts higher level cognitive and communicative function (initiation, problem solving, participation in ADL's such as feeding). With max cueing pt was able to verbalize single words such as his name, age and  "yes", and demonstrated ability to follow one step commands with max cues to attend. Unlikely that pt has a true aphasia but  efforts to communicate are impacted by poor attention and flat affect.     SLP Assessment  Patient needs continued Speech Lanaguage Pathology Services    Follow Up Recommendations       Frequency and Duration min 2x/week  2 weeks   Pertinent Vitals/Pain NA   SLP Goals  SLP Goals SLP Goal #1: Pt will sustain attention to basic functional tasks for 15 seconds with max contextual cues.  SLP Goal #2: Pt will verbalize in automatic contexts with max verbal cues x3 in session.  SLP Goal #3: Pt will initiate communication of basic wants/needs with max contextual cues during functional task in 80% of opportunities.   SLP Evaluation Prior Functioning  Cognitive/Linguistic Baseline: Within functional limits   Cognition  Overall Cognitive Status: Impaired/Different from baseline Arousal/Alertness: Lethargic Orientation Level: Oriented to person;Disoriented to place Attention: Focused Focused Attention: Impaired Focused Attention Impairment: Verbal basic;Functional basic (needs max verbal and visual cues to focus attention) Executive Function: Initiating Initiating: Impaired Initiating Impairment: Verbal basic;Functional basic    Comprehension  Auditory Comprehension Overall Auditory Comprehension: Impaired Yes/No Questions: Impaired Basic Biographical Questions: 26-50% accurate Commands: Impaired One Step Basic Commands: 50-74% accurate Interfering Components: Attention Visual Recognition/Discrimination Discrimination: Not tested Reading Comprehension Reading Status: Not tested    Expression Verbal Expression Overall Verbal Expression: Impaired Initiation: Impaired Automatic Speech: Name Level of Generative/Spontaneous Verbalization: Word Repetition: No impairment Naming: Not tested Pragmatics: Impairment Impairments: Abnormal affect;Eye contact;Monotone Interfering Components: Attention   Oral / Motor Oral Motor/Sensory Function Overall Oral Motor/Sensory Function:  Impaired Labial ROM: Reduced left Labial Symmetry: Abnormal symmetry left Labial Strength: Reduced Labial Sensation:  (UTA) Lingual ROM: Within Functional Limits Lingual Symmetry: Within Functional Limits Lingual Strength: Reduced (suspect mild lingual weakness on left. ) Facial ROM: Reduced left Facial Symmetry: Left droop Facial Strength: Reduced Motor Speech Overall Motor Speech: Appears within functional limits for tasks assessed Respiration: Within functional limits Phonation: Normal Resonance: Within functional limits Articulation: Within functional limitis Intelligibility: Intelligible   GO    Harlon Ditty, MA CCC-SLP [phone]  Claudine Mouton 08/22/2012, 10:24 AM

## 2012-08-23 ENCOUNTER — Encounter (HOSPITAL_COMMUNITY): Payer: Self-pay | Admitting: Radiology

## 2012-08-23 ENCOUNTER — Inpatient Hospital Stay (HOSPITAL_COMMUNITY): Payer: BC Managed Care – PPO

## 2012-08-23 DIAGNOSIS — I6381 Other cerebral infarction due to occlusion or stenosis of small artery: Secondary | ICD-10-CM | POA: Diagnosis present

## 2012-08-23 DIAGNOSIS — I639 Cerebral infarction, unspecified: Secondary | ICD-10-CM | POA: Diagnosis present

## 2012-08-23 LAB — BASIC METABOLIC PANEL
BUN: 17 mg/dL (ref 6–23)
Calcium: 9.5 mg/dL (ref 8.4–10.5)
GFR calc non Af Amer: >90 mL/min (ref >90)
Glucose, Bld: 232 mg/dL — ABNORMAL HIGH (ref 70–99)

## 2012-08-23 LAB — GLUCOSE, CAPILLARY
Glucose-Capillary: 205 mg/dL — ABNORMAL HIGH (ref 70–99)
Glucose-Capillary: 233 mg/dL — ABNORMAL HIGH (ref 70–99)

## 2012-08-23 LAB — HEMOGLOBIN A1C
Hgb A1c MFr Bld: 7.4 % — ABNORMAL HIGH (ref <5.7)
Mean Plasma Glucose: 166 mg/dL — ABNORMAL HIGH (ref <117)

## 2012-08-23 MED ORDER — ACETAMINOPHEN 325 MG PO TABS
650.0000 mg | ORAL_TABLET | Freq: Four times a day (QID) | ORAL | Status: DC | PRN
  Administered 2012-08-23: 650 mg via ORAL
  Filled 2012-08-23: qty 2

## 2012-08-23 MED ORDER — IOHEXOL 300 MG/ML  SOLN
80.0000 mL | Freq: Once | INTRAMUSCULAR | Status: AC | PRN
  Administered 2012-08-23: 80 mL via INTRAVENOUS

## 2012-08-23 MED ORDER — GLUCERNA SHAKE PO LIQD
237.0000 mL | Freq: Three times a day (TID) | ORAL | Status: DC
  Administered 2012-08-23 – 2012-08-28 (×12): 237 mL via ORAL

## 2012-08-23 MED ORDER — INSULIN DETEMIR 100 UNIT/ML ~~LOC~~ SOLN
10.0000 [IU] | Freq: Every day | SUBCUTANEOUS | Status: DC
  Administered 2012-08-23 – 2012-08-24 (×2): 10 [IU] via SUBCUTANEOUS
  Filled 2012-08-23 (×4): qty 0.1

## 2012-08-23 NOTE — Progress Notes (Signed)
Placed patient on home CPAP machine.  Full face mask with 2L Oxygen bled in.  RT will continue to monitor.

## 2012-08-23 NOTE — Consult Note (Signed)
Reason for Consult: hypoglycemic, unresponsive in his car and questionable depression and SI Referring Physician: Dr. Rosalva Ferron Thomas Frye is an 56 y.o. male.  HPI: Patient was seen for psych consultation follow up. Patient was awake, alert but not oriented to his surroundings. He knows he is in hospital but could tell me what brought him in and he was unable to voice any complaints and even safety concerns. He stated that he is feeling fine. Patient reported that he does not remember about his stresses and suicidal attempt. Patient verbal responses are limited with words and without reliability. Patient seems like struggling to find his vocabulary to respond to queries. Patient wife is in hospital and seems to be supportive of him. She asks appropriate questions and pleasant to talk to. She also stated that his communication is words only.   Reportedly he had good communication with his wife even after separation and also the 4 children who are 58-77 years old. Patient has been following up with the psychiatrist and as a Veterinary surgeon at [facility]. Patient reported that he has an augmented with his mother and go off in his car and later found in [place],  in a parking lot. Reportedly patient has a history of for the second attempt several years ago. Patient wife has contacted patient individual counselor and found out the psychologist was surprised to hear about his current condition.He has a space boot on rt ankle and a rt foot silver dollar wound has purulent drainage and has obviously been treated by [facility] type facility.   Mental Status Examination: Patient is awake, alert but not oriented well. He has decreased psychomotor activity and limit verbal and nodding head as a communication. Patient has denied depression and anxiety. Patient' stated mood is fine and affect is flat. He denied current SI/HI and no evidence of gesture and behaviors. Patient has no evidence of auditory or visual  hallucinations, delusions, and paranoia. Patient has poor insight, judgment and impulse control.  Past Medical History  Diagnosis Date  . Diabetes mellitus   . Hypertension   . Depression   . GERD (gastroesophageal reflux disease)   . H/O hiatal hernia   . Neuromuscular disorder     neuropathy  . Sleep apnea     wears cpap at night    Past Surgical History  Procedure Laterality Date  . Toe amputation    . Eye surgery    . No past surgeries    . Fracture surgery      Questionable steel screws to right tibia    History reviewed. No pertinent family history.  Social History:  reports that he has never smoked. He does not have any smokeless tobacco history on file. He reports that he does not use illicit drugs. His alcohol history is not on file.  Allergies:  Allergies  Allergen Reactions  . Latex Swelling  . Sulfa Antibiotics Hives    Medications: I have reviewed the patient's current medications.  Results for orders placed during the hospital encounter of 08/18/12 (from the past 48 hour(s))  GLUCOSE, CAPILLARY     Status: Abnormal   Collection Time    08/21/12  7:47 PM      Result Value Range   Glucose-Capillary 220 (*) 70 - 99 mg/dL  GLUCOSE, CAPILLARY     Status: Abnormal   Collection Time    08/21/12 11:42 PM      Result Value Range   Glucose-Capillary 212 (*) 70 - 99  mg/dL   Comment 1 Notify RN    URINALYSIS, ROUTINE W REFLEX MICROSCOPIC     Status: Abnormal   Collection Time    08/22/12  3:14 AM      Result Value Range   Color, Urine YELLOW  YELLOW   APPearance CLEAR  CLEAR   Specific Gravity, Urine 1.035 (*) 1.005 - 1.030   pH 6.0  5.0 - 8.0   Glucose, UA >1000 (*) NEGATIVE mg/dL   Hgb urine dipstick SMALL (*) NEGATIVE   Bilirubin Urine NEGATIVE  NEGATIVE   Ketones, ur >80 (*) NEGATIVE mg/dL   Protein, ur NEGATIVE  NEGATIVE mg/dL   Urobilinogen, UA 0.2  0.0 - 1.0 mg/dL   Nitrite NEGATIVE  NEGATIVE   Leukocytes, UA NEGATIVE  NEGATIVE  URINE  MICROSCOPIC-ADD ON     Status: None   Collection Time    08/22/12  3:14 AM      Result Value Range   Squamous Epithelial / LPF RARE  RARE   WBC, UA 3-6  <3 WBC/hpf   RBC / HPF 3-6  <3 RBC/hpf   Bacteria, UA RARE  RARE   Urine-Other LESS THAN 10 mL OF URINE SUBMITTED    GLUCOSE, CAPILLARY     Status: Abnormal   Collection Time    08/22/12  4:15 AM      Result Value Range   Glucose-Capillary 173 (*) 70 - 99 mg/dL   Comment 1 Notify RN    COMPREHENSIVE METABOLIC PANEL     Status: Abnormal   Collection Time    08/22/12  5:00 AM      Result Value Range   Sodium 139  135 - 145 mEq/L   Potassium 3.9  3.5 - 5.1 mEq/L   Chloride 100  96 - 112 mEq/L   CO2 24  19 - 32 mEq/L   Glucose, Bld 225 (*) 70 - 99 mg/dL   BUN 15  6 - 23 mg/dL   Creatinine, Ser 1.61  0.50 - 1.35 mg/dL   Calcium 9.2  8.4 - 09.6 mg/dL   Total Protein 6.4  6.0 - 8.3 g/dL   Albumin 2.7 (*) 3.5 - 5.2 g/dL   AST 26  0 - 37 U/L   ALT 24  0 - 53 U/L   Alkaline Phosphatase 68  39 - 117 U/L   Total Bilirubin 0.6  0.3 - 1.2 mg/dL   GFR calc non Af Amer >90  >90 mL/min   GFR calc Af Amer >90  >90 mL/min   Comment:            The eGFR has been calculated     using the CKD EPI equation.     This calculation has not been     validated in all clinical     situations.     eGFR's persistently     <90 mL/min signify     possible Chronic Kidney Disease.  CBC WITH DIFFERENTIAL     Status: Abnormal   Collection Time    08/22/12  5:00 AM      Result Value Range   WBC 10.7 (*) 4.0 - 10.5 K/uL   RBC 3.42 (*) 4.22 - 5.81 MIL/uL   Hemoglobin 10.9 (*) 13.0 - 17.0 g/dL   HCT 04.5 (*) 40.9 - 81.1 %   MCV 95.0  78.0 - 100.0 fL   MCH 31.9  26.0 - 34.0 pg   MCHC 33.5  30.0 - 36.0 g/dL   RDW  12.9  11.5 - 15.5 %   Platelets 231  150 - 400 K/uL   Neutrophils Relative % 75  43 - 77 %   Neutro Abs 8.0 (*) 1.7 - 7.7 K/uL   Lymphocytes Relative 13  12 - 46 %   Lymphs Abs 1.4  0.7 - 4.0 K/uL   Monocytes Relative 10  3 - 12 %    Monocytes Absolute 1.1 (*) 0.1 - 1.0 K/uL   Eosinophils Relative 1  0 - 5 %   Eosinophils Absolute 0.1  0.0 - 0.7 K/uL   Basophils Relative 1  0 - 1 %   Basophils Absolute 0.1  0.0 - 0.1 K/uL  GLUCOSE, CAPILLARY     Status: Abnormal   Collection Time    08/22/12  8:01 AM      Result Value Range   Glucose-Capillary 209 (*) 70 - 99 mg/dL  GLUCOSE, CAPILLARY     Status: Abnormal   Collection Time    08/22/12 11:23 AM      Result Value Range   Glucose-Capillary 212 (*) 70 - 99 mg/dL  GLUCOSE, CAPILLARY     Status: Abnormal   Collection Time    08/22/12  4:35 PM      Result Value Range   Glucose-Capillary 199 (*) 70 - 99 mg/dL  GLUCOSE, CAPILLARY     Status: Abnormal   Collection Time    08/22/12  8:24 PM      Result Value Range   Glucose-Capillary 217 (*) 70 - 99 mg/dL   Comment 1 Documented in Chart     Comment 2 Notify RN    GLUCOSE, CAPILLARY     Status: Abnormal   Collection Time    08/23/12 12:30 AM      Result Value Range   Glucose-Capillary 197 (*) 70 - 99 mg/dL   Comment 1 Documented in Chart     Comment 2 Notify RN    BASIC METABOLIC PANEL     Status: Abnormal   Collection Time    08/23/12  3:40 AM      Result Value Range   Sodium 140  135 - 145 mEq/L   Potassium 3.9  3.5 - 5.1 mEq/L   Chloride 102  96 - 112 mEq/L   CO2 25  19 - 32 mEq/L   Glucose, Bld 232 (*) 70 - 99 mg/dL   BUN 17  6 - 23 mg/dL   Creatinine, Ser 1.61  0.50 - 1.35 mg/dL   Calcium 9.5  8.4 - 09.6 mg/dL   GFR calc non Af Amer >90  >90 mL/min   GFR calc Af Amer >90  >90 mL/min   Comment:            The eGFR has been calculated     using the CKD EPI equation.     This calculation has not been     validated in all clinical     situations.     eGFR's persistently     <90 mL/min signify     possible Chronic Kidney Disease.  GLUCOSE, CAPILLARY     Status: Abnormal   Collection Time    08/23/12  3:56 AM      Result Value Range   Glucose-Capillary 225 (*) 70 - 99 mg/dL   Comment 1 Documented  in Chart     Comment 2 Notify RN    GLUCOSE, CAPILLARY     Status: Abnormal   Collection Time  08/23/12  9:05 AM      Result Value Range   Glucose-Capillary 205 (*) 70 - 99 mg/dL  GLUCOSE, CAPILLARY     Status: Abnormal   Collection Time    08/23/12 11:35 AM      Result Value Range   Glucose-Capillary 238 (*) 70 - 99 mg/dL  GLUCOSE, CAPILLARY     Status: Abnormal   Collection Time    08/23/12  3:56 PM      Result Value Range   Glucose-Capillary 233 (*) 70 - 99 mg/dL    No results found.  Positive for anxiety, bad mood, depression and relationship problems Blood pressure 145/65, pulse 87, temperature 98.8 F (37.1 C), temperature source Oral, resp. rate 20, height 6\' 4"  (1.93 m), weight 278 lb 10.6 oz (126.4 kg), SpO2 97.00%.   Assessment/Plan: Depression NOS  Recommendation:  #1. May start antidepressant medication when medically cleared #2. Discontinue sitter for safety #3. Appreciate psych social service visit and involvement #4. Appreciate psychiatric consultation and followup as needed  Yamileth Hayse,JANARDHAHA R. 08/23/2012, 5:24 PM

## 2012-08-23 NOTE — Progress Notes (Signed)
Speech Language Pathology Treatment Patient Details Name: Thomas Frye MRN: [id number] DOB: 18-Nov-1955 Today's Date: 08/23/2012 Time: 0815-0905 SLP Time Calculation (min): 50 min  Assessment / Plan / Recommendation Clinical Impression  Treatment session focused on attention, problem solving, functional communication and dysphagia. Pt showing significant improvment today. He is still lethargic but able to focus attention in 100% of trials and sustain attention to functional tasks for 1 minute and verbal tasks for up to 30 seconds. He is demonstrating basic functional problem solving skills. He is speaking in short phrases to make requests and verbalize ideas though he requires moderate choice and question cues. He did have one instance of paraphasias and perseveration with one phrase. He follows 1 step commands with 80% accuracy, but required max cues for complex commands. His affect is improving, he was observed to smile and chuckle in conversation with wife and therapist. Given improvement in cognition pt was fully aware of POs initially with moderate cues, then fading to supervision with self feeding of thin liquids and regular textures. Pt is safe to initaite a regular diet and thin liqudis. SLP will continue to follow for tolerance and cognitive therapy. He will be an excellent CIR candidate.     SLP Plan  Continue with current plan of care    Pertinent Vitals/Pain NA  SLP Goals  SLP Goals Potential to Achieve Goals: Good SLP Goal #1: Pt will sustain attention to basic functional tasks for 15 seconds with max contextual cues.  SLP Goal #1 - Progress: Met SLP Goal #2: Pt will verbalize in automatic contexts with max verbal cues x3 in session.  SLP Goal #2 - Progress: Met SLP Goal #3: Pt will initiate communication of basic wants/needs with max contextual cues during functional task in 80% of opportunities.  SLP Goal #3 - Progress: Progressing toward goal  General    Oral Cavity -  Oral Hygiene Does patient have any of the following "at risk" factors?: Oxygen therapy - cannula, mask, simple oxygen devices Patient is HIGH RISK - Oral Care Protocol followed (see row info): Yes Patient is AT RISK - Oral Care Protocol followed (see row info): Yes   Treatment Treatment focused on: Cognition (dysphagia)   GO    Harlon Ditty, MA CCC-SLP [phone]  Claudine Mouton 08/23/2012, 9:18 AM

## 2012-08-23 NOTE — Clinical Social Work Psych Note (Signed)
Psych CSW is continuing to follow pt.  Psych CSW to assess once pt becomes more participatory and projected to d/c.  No inpatient behavioral health plans can be made until pt is medically stable.   Psych CSW will continue to follow, monitor and f/u with RN re: pt projection of d/c.  Please contact Psych CSW questions/concerns.    Vickii Penna, LCSWA [phone]  Clinical Social Work

## 2012-08-23 NOTE — Progress Notes (Signed)
Inpatient Diabetes Program Recommendations  AACE/ADA: New Consensus Statement on Inpatient Glycemic Control (2013)  Target Ranges:  Prepandial:   less than 140 mg/dL      Peak postprandial:   less than 180 mg/dL (1-2 hours)      Critically ill patients:  140 - 180 mg/dL   Results for JANIS, CUFFE (MRN [id number]) as of 08/23/2012 09:30  Ref. Range 08/22/2012 04:15 08/22/2012 11:23 08/22/2012 16:35 08/22/2012 20:24 08/23/2012 00:30 08/23/2012 03:56 08/23/2012 09:05  Glucose-Capillary Latest Range: 70-99 mg/dL 409 (H) 811 (H) 914 (H) 217 (H) 197 (H) 225 (H) 205 (H)    Inpatient Diabetes Program Recommendations Insulin - Basal: Please consider ordering low dose basal insulin.  Recommend starting with Levemir 10 units daily and adjust accordingly to improve inpatient glycemic control.  Note: Patient initially had profound hypoglycemia but blood glucose is now consistently elevated greater than 190 mg/dl.  Note that Novolog correction scale was increased to moderate scale on 08/22/12. Please consider ordering low dose basal insulin; recommend starting with Levemir 10 units daily. Will continue to follow.   Thanks,  Orlando Penner, RN, MSN, CCRN  Diabetes Coordinator  Inpatient Diabetes Program  [phone]

## 2012-08-23 NOTE — Progress Notes (Signed)
Physical Therapy Treatment Patient Details Name: Thomas Frye MRN: [id number] DOB: January 11, 1956 Today's Date: 08/23/2012 Time: 0865-7846 PT Time Calculation (min): 42 min  PT Assessment / Plan / Recommendation Comments on Treatment Session  Pt with increased attention and ability to follow commands today. Patient able to perform an array of tasks with upper body reaching, reaching across midline, active range of motion, and self care/feeding tasks.  Patient much more verbally engaged today as well.  Patient and spouse educated on activities for continued  engagement as well as LE exercises to be performed throughout afternoon as tolerated.  Patient was able to lift both LEs against gravity with increased strength against resistance today.  Patient also demonstrates good recall of events from early today and throughout session. Wound care nurse came to asses/dress R foot wound. If patient continues to progress, will reattempt transfer and standing/pre-gait activities with assist next session.  Will continue to see as indicated.    Follow Up Recommendations  CIR     Does the patient have the potential to tolerate intense rehabilitation     Barriers to Discharge        Equipment Recommendations  Other (comment) (TBD)    Recommendations for Other Services Rehab consult  Frequency Min 3X/week   Plan Discharge plan remains appropriate    Precautions / Restrictions Precautions Required Braces or Orthoses:  (space boot on rt ankle)   Pertinent Vitals/Pain No pain reported per pt    Mobility  Bed Mobility Bed Mobility: Not assessed Details for Bed Mobility Assistance: lifted to chair by nsg prior to arrival Transfers Transfers: Not assessed Details for Transfer Assistance: attempted +2 elevation from chair x2 but patient unresponsive to stimulus of movement and unable to engage in task of standing Ambulation/Gait Ambulation/Gait Assistance: Not tested (comment)    Exercises General  Exercises - Lower Extremity Ankle Circles/Pumps: AROM;Both Long Arc Quad: AROM;Both Straight Leg Raises: AROM;Both     PT Goals Acute Rehab PT Goals PT Goal Formulation: Patient unable to participate in goal setting Time For Goal Achievement: 09/05/12 Potential to Achieve Goals: Fair Pt will go Supine/Side to Sit: with min assist Pt will Sit at Edge of Bed: with supervision Pt will go Sit to Stand: with min assist Pt will Transfer Bed to Chair/Chair to Bed: with min assist Pt will Ambulate: 16 - 50 feet;with min assist PT Goal: Ambulate - Progress: Goal set today Pt will Perform Home Exercise Program: Independently PT Goal: Perform Home Exercise Program - Progress: Progressing toward goal  Visit Information  Last PT Received On: 08/23/12 Assistance Needed: +2    Subjective Data  Subjective: Pt much more engaged and verbally responsive today Patient Stated Goal: none stated   Cognition  Cognition Arousal/Alertness: Awake/alert Overall Cognitive Status: Impaired/Different from baseline Area of Impairment: Attention;Following commands Orientation Level: Disoriented to;Time;Situation Current Attention Level: Sustained Following Commands: Follows one step commands consistently General Comments: Patient able to carryout self tasks such as feeding, able to perform actions as instructed (lift legs, raise arm, etc)    Balance  Balance Balance Assessed: Yes Static Sitting Balance Static Sitting - Balance Support: Feet supported Static Sitting - Level of Assistance: 5: Stand by assistance  End of Session PT - End of Session Equipment Utilized During Treatment: Oxygen Activity Tolerance: Patient limited by fatigue;Other (comment) (Cognitive limitations, extreme lethargy) Patient left: in chair;with call bell/phone within reach;with nursing in room;with family/visitor present Nurse Communication: Need for lift equipment   GP  Fabio Asa 08/23/2012, 2:48 PM Charlotte Crumb, PT DPT  [phone]
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
## 2012-08-23 NOTE — Progress Notes (Signed)
[facility] Progress Note [place] TEAM 1 - Stepdown/ICU TEAM   BRANDONN CAPELLI AVW:[id number] DOB: 1955/10/18 DOA: 08/18/2012 PCP: Suszanne Conners E., PA-C  Brief narrative: 57 year old white male found unresponsive in his car. CBG was checked and he was found to be profoundly hypoglycemic with a reading of 15 and was unresponsive. He was apparently found with insulin syringes/insulin bottle next to him in the car. He was nasally intubated in the field and given to D50 boluses and transferred emergently to the emergency department. There was question of seizure-like activity in the field so CT of the head was ordered but had not been done at time of admission. Patient has known history of hypertension and diabetes with peripheral vascular disease and has a chronic wound on his right ankle and foot. He was admitted to the ICU.  CT scan completed after admission questioned right thalamic infarct so neurology was consulted. MRI was recommended to better clarify the thalamic changes but because of existing tibial hardware MRI could not be completed. Since admission patient's sugars have actually been stable and at this time are increasing to greater than 200. He awakened enough to be extubated once his hypoglycemia was corrected but according to his wife he has not returned to his baseline.  In discussion with the patient and his wife, she confirmed they have been having marital problems and that the wife and husband are separated and the patient had been living with his mother. Prior to this admission the patient apparently had gotten into a verbal disagreement with his mother over how he was missed managing his diabetes and patient left her residence and angry. It was after this that the patient was found unresponsive in the setting that was concerning for an intentional insulin overdose. Because of this psychiatry was consulted. The patient's wife did notify the patient's outpatient psychologist of  current situation if concern for possible intentional overdose. Apparently the psychologist was surprised to hear about the patient's current status. Patient had recently been started on antidepressants and apparently had been improving. It is important to note that the patient's wedding anniversary was this past weekend and correlated with the timing of the patient's admission.  He is otherwise stabilized from a respiratory and hemodynamic standpoint was deemed appropriate to transfer to the neurological floor. Team 1 assumed care of this patient on 08/23/2012.  Assessment/Plan: Principal Problem:   Severe diabetic hypoglycemia -resolved -unsure if this was intentional as suicide attempt or accidental -psych following- pt was receiving OP tx for depression and was separated from his wife  Active Problems:   Acute encephalopathy/ ? Thalamic infarction -initially due to profound hypoglycemia and hypoventilation/hypoxia -seems influenced now by suspected acute brain injury vs CVA -Neuro following -repeat CT Head but with contrast to complete work up -PT and OT evaluations- CIR recommended and consulted  Dysphagia -SLP following -more alert today and advancing to non-modified diet     Acute respiratory failure in setting of profound hypoglycemia -resolved     DM (diabetes mellitus), type 2 with peripheral vascular complications -now uncontrolled with CBG > 200 -add Levemir and cont SSI -check Hgb A1c    HTN (hypertension) -was on ACE I pre admit- will hold for now since plan contrasted CT -cont IV prn Apresoline    Noncompliance    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 Has no recollection of events leading up to hospitalization. Wife at bedside assisting with obtaining history. Patient clearly not at baseline mentation according to wife. No apparent complaints verbalized.   Objective: Blood pressure 145/65, pulse 87, temperature 98.8 F (37.1 C), temperature source Oral, resp. rate 20, height 6\' 4"  (1.93 m), weight 126.4 kg (278 lb 10.6 oz), SpO2 97.00%.  Intake/Output Summary (Last 24 hours) at 08/23/12 1258 Last data filed at 08/23/12 1000  Gross per 24 hour  Intake   1235 ml  Output   1495 ml  Net   -260 ml     Exam: General: No acute respiratory distress Lungs: Clear to auscultation bilaterally without wheezes or crackles, RA Cardiovascular: Regular rate and rhythm without murmur gallop or rub normal S1 and S2, no peripheral edema or JVD Abdomen: Nontender, nondistended, soft, bowel sounds positive, no rebound, no ascites, no appreciable mass Musculoskeletal: No significant cyanosis, clubbing of bilateral lower extremities Neurological: Flat affect, Alert and oriented x name, moves all extremities x 4 with left side weakness strength estimated at 4/5, CN 2-12 intact except for subtle left facial droop  Scheduled Meds: Scheduled Meds: . ciprofloxacin  400 mg Intravenous Q12H  . doxycycline (VIBRAMYCIN) IV  100 mg Intravenous Q12H  . heparin subcutaneous  5,000 Units Subcutaneous Q8H  . insulin aspart  0-15 Units Subcutaneous Q4H  . insulin detemir  10 Units Subcutaneous Daily  . pantoprazole (PROTONIX) IV  40 mg Intravenous QHS   Continuous Infusions: . sodium chloride 15 mL/hr at 08/22/12 1900    Data Reviewed: Basic Metabolic Panel:  Recent Labs Lab 08/18/12 1024  08/18/12 1400 08/19/12 0430 08/19/12 0630 08/19/12 2048 08/20/12 0355 08/22/12 0500 08/23/12 0340  NA 134* 134* 133*  --  126* 130* 139 140  K 6.2* 3.3* 2.9*  --  4.4 4.1 3.9 3.9  CL 97 100 98  --  91* 94* 100 102  CO2 26 26 25   --  24 23 24 25   GLUCOSE 142* 128* 70  --  445* 367* 225* 232*  BUN 22 19 13   --  18 21 15 17   CREATININE 0.66 0.62 0.90  --  1.07 1.14 0.79 0.83  CALCIUM 9.7 9.1 9.2  --  8.9 8.9 9.2 9.5  MG  --  1.8  --  1.7  --   --   --   --   PHOS  --  0.7*  --  2.7  --   --   --   --    Liver Function Tests:  Recent Labs Lab 08/18/12 1024 08/18/12 1400 08/22/12 0500  AST 36 44* 26  ALT 19 17 24   ALKPHOS 68 54 68  BILITOT 0.3 0.4 0.6  PROT 7.8 6.3 6.4  ALBUMIN 3.7 3.1* 2.7*   No results found for this basename: LIPASE, AMYLASE,  in the last 168 hours  Recent Labs Lab 08/18/12 1037  AMMONIA 36   CBC:  Recent Labs Lab 08/18/12 1024 08/18/12 1400 08/19/12 0430 08/20/12 0355 08/22/12 0500  WBC 20.9* 15.5* 11.1* 10.5 10.7*  NEUTROABS 18.8*  --   --   --  8.0*  HGB 13.5 11.6* 11.1* 11.1* 10.9*  HCT 39.5 33.4* 31.8* 31.6* 32.5*  MCV 95.4 92.5 92.4 92.1 95.0  PLT 260 227 212 209 231   Cardiac Enzymes:  Recent Labs Lab 08/18/12 1036  TROPONINI <0.30   BNP (last 3 results) No results found for this basename: PROBNP,  in the last 8760 hours CBG:  Recent Labs Lab 08/22/12 2024 08/23/12 0030 08/23/12 0356 08/23/12 0905 08/23/12 1135  GLUCAP 217* 197* 225* 205* 238*    Recent Results (from the past 240 hour(s))  URINE CULTURE     Status: None   Collection Time    08/18/12 11:12 AM      Result Value Range Status   Specimen Description URINE, CATHETERIZED   Final   Special Requests Normal   Final   Culture  Setup Time 08/19/2012 12:54   Final   Colony Count NO GROWTH   Final   Culture NO GROWTH   Final   Report Status 08/20/2012 FINAL   Final  CULTURE, BLOOD (ROUTINE X 2)     Status: None   Collection Time    08/18/12  1:51 PM       Result Value Range Status   Specimen Description BLOOD RIGHT ARM   Final   Special Requests BOTTLES DRAWN AEROBIC ONLY 10CC   Final   Culture  Setup Time 08/18/2012 20:38   Final   Culture     Final   Value:        BLOOD CULTURE RECEIVED NO GROWTH TO DATE CULTURE WILL BE HELD FOR 5 DAYS BEFORE ISSUING A FINAL NEGATIVE REPORT   Report Status PENDING   Incomplete  CULTURE, BLOOD (ROUTINE X 2)     Status: None   Collection Time    08/18/12  2:00 PM      Result Value Range Status   Specimen Description BLOOD RIGHT ARM   Final   Special Requests BOTTLES DRAWN AEROBIC ONLY 5CC   Final   Culture  Setup Time 08/18/2012 20:37   Final   Culture     Final   Value:        BLOOD CULTURE RECEIVED NO GROWTH TO DATE CULTURE WILL BE HELD FOR 5 DAYS BEFORE ISSUING A FINAL NEGATIVE REPORT   Report Status PENDING   Incomplete  MRSA PCR SCREENING     Status: None   Collection Time    08/18/12  2:08 PM      Result Value Range Status   MRSA by PCR NEGATIVE  NEGATIVE Final   Comment:            The GeneXpert MRSA Assay (FDA     approved for NASAL specimens     only), is one component of a     comprehensive MRSA colonization     surveillance program. It is not     intended to diagnose MRSA     infection nor to guide or     monitor treatment for     MRSA infections.  CULTURE, RESPIRATORY (NON-EXPECTORATED)     Status: None   Collection Time    08/18/12  7:54 PM      Result Value Range Status   Specimen Description TRACHEAL ASPIRATE   Final   Special Requests NONE   Final   Gram Stain     Final   Value: MODERATE WBC PRESENT, PREDOMINANTLY PMN     NO SQUAMOUS EPITHELIAL CELLS SEEN     RARE GRAM POSITIVE  COCCI     IN PAIRS   Culture Non-Pathogenic Oropharyngeal-type Flora Isolated.   Final   Report Status 08/21/2012 FINAL   Final     Studies:  Recent x-ray studies have been reviewed in detail by the Attending Physician  Scheduled Meds:  Reviewed in detail by the Attending  Physician   Junious Silk, ANP [facility] Office  [phone] Pager [phone]  On-Call/Text Page:      [email or web address]      password [email or web address]  If 7PM-7AM, please contact night-coverage [email or web address] Password [facility] 08/23/2012, 12:58 PM   LOS: 5 days   I have examined the patient, reviewed the chart and modified the above note which I agree with.   Kathaleya Mcduffee,MD [phone] 08/23/2012, 3:02 PM

## 2012-08-23 NOTE — Progress Notes (Signed)
NUTRITION FOLLOW UP  Intervention:    Glucerna Shake po TID, each supplement provides 220 kcal and 10 grams of protein.  Nutrition Dx:   Inadequate oral intake related to poor appetite and altered mental status as evidenced by </= 50% meal completion. Ongoing.  New Goal:   Intake to meet >90% of estimated nutrition needs. Unmet.  Monitor:   PO intake, wound healing, weight trend, labs.  Assessment:   Patient was extubated on 5/27. TF was never initiated. Diet has been advanced to CHO-modified medium calorie. Per review of progress notes, patient with lethargy and decreased focus of attention, which is affecting his ability to consume adequate PO's. Sitter in room with patient; reports that patient consumed very little of breakfast today, and ate ~50% of lunch. Patient with a chronic full thickness wound on his right foot, increasing protein and calorie needs. Current intake is not meeting nutrition needs; will add supplements.  Height: Ht Readings from Last 1 Encounters:  08/18/12 6\' 4"  (1.93 m)    Weight Status:   Wt Readings from Last 1 Encounters:  08/22/12 278 lb 10.6 oz (126.4 kg)  08/21/12  273 lb 5.9 oz (124 kg) 08/20/12  289 lb 3.9 oz (131.2 kg)  Weight fluctuating with fluid status.  Body mass index is 33.93 kg/(m^2). 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 Wound is the result of a contact cast. Was planning for skin graft on 5/30.  Diet Order: CHO-modified medium   Intake/Output Summary (Last 24 hours) at 08/23/12 1508 Last data filed at 08/23/12 1000  Gross per 24 hour  Intake    940 ml  Output   1245 ml  Net   -305 ml    Last BM: 5/28   Labs:   Recent Labs Lab 08/18/12 1024 08/18/12 1400  08/19/12 0630  08/20/12 0355 08/22/12 0500 08/23/12 0340  NA 134* 134*  < >  --   < > 130* 139 140  K 6.2* 3.3*  < >  --   < > 4.1 3.9 3.9  CL 97  100  < >  --   < > 94* 100 102  CO2 26 26  < >  --   < > 23 24 25   BUN 22 19  < >  --   < > 21 15 17   CREATININE 0.66 0.62  < >  --   < > 1.14 0.79 0.83  CALCIUM 9.7 9.1  < >  --   < > 8.9 9.2 9.5  MG  --  1.8  --  1.7  --   --   --   --   PHOS  --  0.7*  --  2.7  --   --   --   --   GLUCOSE 142* 128*  < >  --   < > 367* 225* 232*  < > = values in this interval not displayed.  CBG (last 3)   Recent Labs  08/23/12 0356 08/23/12 0905 08/23/12 1135  GLUCAP 225* 205* 238*    Scheduled Meds: . ciprofloxacin  400 mg Intravenous Q12H  . doxycycline (VIBRAMYCIN) IV  100 mg Intravenous Q12H  . heparin subcutaneous  5,000 Units Subcutaneous Q8H  . insulin aspart  0-15 Units Subcutaneous Q4H  . insulin detemir  10 Units Subcutaneous Daily  . pantoprazole (PROTONIX) IV  40 mg  Intravenous QHS    Continuous Infusions: . sodium chloride 15 mL/hr at 08/22/12 1900    Joaquin Courts, RD, LDN, CNSC Pager [phone] After Hours Pager [phone]

## 2012-08-23 NOTE — Progress Notes (Signed)
08/22/12 2200  Pt home CPAP approved by [employer].   Pt placed on CPAP.  Elisha Headland RN

## 2012-08-23 NOTE — Consult Note (Signed)
WOC consult Note Reason for Consult: Consult requested for right foot chronic wound.  Wife at bedside states he is followed as an outpatient by Dr Bennett Scrape in [facility] and was scheduled for a graft to the site for closure later this week. Wound type: Full thickness  Measurement: 2X1X.3cm Wound bed: 100% red and dry Drainage (amount, consistency, odor) small tan drainage, no odor Periwound: Intact skin surrounding Dressing procedure/placement/frequency: Wound gel to maintain moist wound bed and promote healing.  Pt can resume follow-up after discharge with previous physician. Please re-consult if further assistance is needed.  Thank-you,  Cammie Mcgee MSN, RN, CWOCN, [place], CNS [phone]

## 2012-08-23 NOTE — Evaluation (Signed)
Occupational Therapy Evaluation Patient Details Name: Thomas Frye MRN: [id number] DOB: 10-23-1955 Today's Date: 08/23/2012 Time: 1320-1350 OT Time Calculation (min): 30 min  OT Assessment / Plan / Recommendation Clinical Impression   Pt is a 57 yo Wm found unresponsive in his care. Found to be profoundly hypoglycemic and unresponsive in his car with insulin and needles.  Limited evaluation today due to multiple disciplines and visitors during session.  Pt presents with cognitive deficits, dependence in all ADL of varying degrees (beginning to self feed only), and requires total assist for mobility.  Per chart review and report of wife and PT, pt is making significant gains in awareness, participation, and ability to follow commands. Pt will need intense post acute rehab to regain functional skills.  Will follow acutely.      OT Assessment  Patient needs continued OT Services    Follow Up Recommendations  CIR    Barriers to Discharge      Equipment Recommendations       Recommendations for Other Services Rehab consult  Frequency  Min 2X/week    Precautions / Restrictions Precautions Precautions: Fall Required Braces or Orthoses:  (space boot on rt ankle)   Pertinent Vitals/Pain Denies pain    ADL  Eating/Feeding: Moderate assistance Where Assessed - Eating/Feeding: Chair ADL Comments: Pt up in chair upon OTs entry set up for lunch.  Wife in room.  Pt able to visually scan tray to locate desired items and reach with either UE to retrieve cup, utensil, bread crossing the body midline with his L UE (dominant hand).  Assist initiating task and spearing food initially, but improved with repetion.  Pt having difficulty removing food item from utensil initially.  Unable to bite off from bread roll.  Did not require redirection, persisted with task without perseveration.  Decreased problem solving when needing assist or running out of beverage.    OT Diagnosis: Generalized  weakness;Cognitive deficits  OT Problem List: Decreased strength;Decreased activity tolerance;Impaired balance (sitting and/or standing);Decreased coordination;Decreased cognition;Decreased safety awareness;Decreased knowledge of use of DME or AE;Impaired UE functional use OT Treatment Interventions: Self-care/ADL training;DME and/or AE instruction;Therapeutic activities;Patient/family education;Cognitive remediation/compensation;Balance training   OT Goals Acute Rehab OT Goals OT Goal Formulation: With family Time For Goal Achievement: 09/06/12 Potential to Achieve Goals: Good ADL Goals Pt Will Perform Eating: with supervision;Sitting, chair;with set-up ADL Goal: Eating - Progress: Goal set today Pt Will Perform Grooming: with min assist;Sitting, chair;Sitting, edge of bed ADL Goal: Grooming - Progress: Goal set today Pt Will Perform Upper Body Bathing: with min assist;Sitting, chair ADL Goal: Upper Body Bathing - Progress: Goal set today Pt Will Perform Upper Body Dressing: with min assist;Sitting, chair ADL Goal: Upper Body Dressing - Progress: Goal set today Pt Will Transfer to Toilet: with 2+ total assist;with DME;3-in-1 (50%) ADL Goal: Toilet Transfer - Progress: Goal set today Miscellaneous OT Goals Miscellaneous OT Goal #1: Pt will be able to sit unsupported with supervision x 10 minutes while engaged in ADL. OT Goal: Miscellaneous Goal #1 - Progress: Goal set today Miscellaneous OT Goal #2: Pt will verbalize necessary items needed to perform ADL with min cues. OT Goal: Miscellaneous Goal #2 - Progress: Goal set today  Visit Information  Last OT Received On: 08/23/12 Assistance Needed: +2 PT/OT Co-Evaluation/Treatment: Yes    Subjective Data  Subjective: "It' ok."   Prior Functioning     Home Living Lives With: Family Available Help at Discharge: Family Type of Home: House Home Access: Stairs to enter  Entrance Stairs-Number of Steps: 4 Entrance Stairs-Rails:  Left Home Layout: One level Bathroom Shower/Tub: Engineer, manufacturing systems: Standard Home Adaptive Equipment: None Prior Function Level of Independence: Independent Able to Take Stairs?: Yes Driving: Yes Vocation: Full time employment Communication Communication: Expressive difficulties Dominant Hand: Left         Vision/Perception Vision - History Baseline Vision: Wears glasses all the time Vision - Assessment Vision Assessment:  (field appear intact during functional activity) Perception Perception:  (intact depth perception and figure ground )   Cognition  Cognition Arousal/Alertness: Awake/alert Behavior During Therapy:  (smiled appropriately with joking) Overall Cognitive Status: Impaired/Different from baseline Area of Impairment: Attention;Memory;Awareness;Problem solving Orientation Level: Disoriented to;Time;Situation Current Attention Level: Sustained Memory: Decreased short-term memory Following Commands: Follows one step commands consistently Awareness: Intellectual Problem Solving: Slow processing;Decreased initiation General Comments: Patient able to carryout self tasks such as feeding, able to perform actions as instructed (lift legs, raise arm, etc)    Extremity/Trunk Assessment Right Upper Extremity Assessment RUE ROM/Strength/Tone: Deficits;Unable to fully assess;Due to impaired cognition RUE ROM/Strength/Tone Deficits: able to reach to 90 degrees shoulder flexion, full AROM elbow to hand per observation RUE Coordination: Deficits Left Upper Extremity Assessment LUE ROM/Strength/Tone: Deficits;Unable to fully assess;Due to impaired cognition LUE ROM/Strength/Tone Deficits: able to reach in ranges to 90 degrees shoulder flexion, full AROM elbow to hand per observation LUE Coordination: Deficits     Mobility Bed Mobility Bed Mobility: Not assessed Details for Bed Mobility Assistance: lifted to chair by nsg prior to arrival   Exercise     Balance Balance Balance Assessed: Yes Static Sitting Balance Static Sitting - Balance Support: Feet supported Static Sitting - Level of Assistance: 5: Stand by assistance   End of Session OT - End of Session Activity Tolerance: Patient tolerated treatment well Patient left: in chair;with call bell/phone within reach;with family/visitor present  GO     Evern Bio 08/23/2012, 4:58 PM [phone]

## 2012-08-24 ENCOUNTER — Inpatient Hospital Stay (HOSPITAL_COMMUNITY): Payer: BC Managed Care – PPO

## 2012-08-24 DIAGNOSIS — G931 Anoxic brain damage, not elsewhere classified: Secondary | ICD-10-CM

## 2012-08-24 DIAGNOSIS — G9340 Encephalopathy, unspecified: Secondary | ICD-10-CM

## 2012-08-24 DIAGNOSIS — L97909 Non-pressure chronic ulcer of unspecified part of unspecified lower leg with unspecified severity: Secondary | ICD-10-CM

## 2012-08-24 LAB — BASIC METABOLIC PANEL
CO2: 24 mEq/L (ref 19–32)
Calcium: 9.4 mg/dL (ref 8.4–10.5)
Chloride: 97 mEq/L (ref 96–112)
Creatinine, Ser: 0.82 mg/dL (ref 0.50–1.35)
Glucose, Bld: 309 mg/dL — ABNORMAL HIGH (ref 70–99)
Sodium: 134 mEq/L — ABNORMAL LOW (ref 135–145)

## 2012-08-24 LAB — URINALYSIS, ROUTINE W REFLEX MICROSCOPIC
Bilirubin Urine: NEGATIVE
Glucose, UA: >1000 mg/dL — AB
Hgb urine dipstick: NEGATIVE
Specific Gravity, Urine: 1.042 — ABNORMAL HIGH (ref 1.005–1.030)

## 2012-08-24 LAB — URINE MICROSCOPIC-ADD ON

## 2012-08-24 LAB — GLUCOSE, CAPILLARY
Glucose-Capillary: 261 mg/dL — ABNORMAL HIGH (ref 70–99)
Glucose-Capillary: 294 mg/dL — ABNORMAL HIGH (ref 70–99)
Glucose-Capillary: 304 mg/dL — ABNORMAL HIGH (ref 70–99)

## 2012-08-24 LAB — CBC WITH DIFFERENTIAL/PLATELET
Basophils Absolute: 0 10*3/uL (ref 0.0–0.1)
Eosinophils Relative: 2 % (ref 0–5)
HCT: 31.3 % — ABNORMAL LOW (ref 39.0–52.0)
Lymphocytes Relative: 16 % (ref 12–46)
Lymphs Abs: 1.5 10*3/uL (ref 0.7–4.0)
MCV: 93.2 fL (ref 78.0–100.0)
Monocytes Absolute: 0.9 10*3/uL (ref 0.1–1.0)
Neutro Abs: 6.8 10*3/uL (ref 1.7–7.7)
RBC: 3.36 MIL/uL — ABNORMAL LOW (ref 4.22–5.81)
WBC: 9.4 10*3/uL (ref 4.0–10.5)

## 2012-08-24 LAB — CULTURE, BLOOD (ROUTINE X 2)
Culture: NO GROWTH
Culture: NO GROWTH

## 2012-08-24 MED ORDER — LISINOPRIL 10 MG PO TABS
10.0000 mg | ORAL_TABLET | Freq: Every day | ORAL | Status: DC
  Administered 2012-08-24 – 2012-08-29 (×6): 10 mg via ORAL
  Filled 2012-08-24 (×6): qty 1

## 2012-08-24 MED ORDER — ASPIRIN EC 81 MG PO TBEC
81.0000 mg | DELAYED_RELEASE_TABLET | Freq: Every day | ORAL | Status: DC
  Administered 2012-08-24 – 2012-08-29 (×6): 81 mg via ORAL
  Filled 2012-08-24 (×7): qty 1

## 2012-08-24 NOTE — Procedures (Signed)
History: 57 yo M found hypoglycemic, continued encephalopathy  Background: This EEG was recorded mostly in sleep, with normal-appearing sleep structures. During drowsiness there is delta activity, however during periods of maximal wakefulness this abates. At those times the EEG consists of intermixed alpha and beta activities. There is a well defined posterior dominant rhythm of 8.5 Hz that attenuates with eye opening.   Photic stimulation: Physiologic driving is not performed  EEG Abnormalities: None  Clinical Interpretation: This normal EEG is recorded in the waking and sleep state. There was no seizure or seizure predisposition recorded on this study.   [name], MD [facility] [phone]  If 7pm- 7am, please page neurology on call at [phone].

## 2012-08-24 NOTE — Progress Notes (Signed)
[facility] PROGRESS NOTE  Thomas Frye:[id number] DOB: 1955/06/30 DOA: 08/18/2012 PCP: CRAFT,PAUL E., PA-C  Assessment/Plan: Severe diabetic hypoglycemia  -resolved  -unsure if this was intentional as suicide attempt or accidental insulin OD  -psych following- pt was receiving OP tx for depression and was separated from his wife   Acute encephalopathy/ ? Thalamic infarction  -initially due to profound hypoglycemia and hypoventilation/hypoxia  -seems influenced now by suspected acute brain injury vs CVA  -Neuro following  -repeat CT Head with age indeterminate R sided infarct -PT and OT evaluations- CIR recommended and consulted  Dysphagia  -SLP following  -Advance  Acute respiratory failure in setting of profound hypoglycemia  -resolved  DM (diabetes mellitus), type 2 with peripheral vascular complications  -now uncontrolled with CBG > 200  -on Levemir and cont SSI  -A1c of 7.4 HTN (hypertension)  -Renal fx unremarkable -BP suboptimal -Will add back Lisinopril per home regimen Noncompliance  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 Patient's wife in room 6 the patient appears to be improving slowly over time.  Objective: Filed Vitals:   08/23/12 1821 08/23/12 2200 08/24/12 0226 08/24/12 0619  BP: 109/51 137/62 134/67 140/69  Pulse: 95 94 82 76  Temp: 98 F (36.7 C) 100.7 F (38.2 C) 97.8 F (36.6 C) 97.7 F (36.5 C)  TempSrc:  Oral Oral Oral Oral  Resp: 20 20 20 20   Height:      Weight:      SpO2: 95% 97% 98% 98%    Intake/Output Summary (Last 24 hours) at 08/24/12 0746 Last data filed at 08/23/12 1800  Gross per 24 hour  Intake    600 ml  Output   1700 ml  Net  -1100 ml   Filed Weights   08/20/12 0500 08/21/12 0500 08/22/12 0500  Weight: 131.2 kg (289 lb 3.9 oz) 124 kg (273 lb 5.9 oz) 126.4 kg (278 lb 10.6 oz)    Exam:   General:  Patient is awake in no apparent distress  Cardiovascular: Regular, S1-S2  Respiratory: Normal respiratory effort, and no crackles no wheeze  Abdomen: Soft, positive bowel sounds  Musculoskeletal: Perfused distally, no clubbing or cyanosis   Data Reviewed: Basic Metabolic Panel:  Recent Labs Lab 08/18/12 1024 08/18/12 1400  08/19/12 0630 08/19/12 2048 08/20/12 0355 08/22/12 0500 08/23/12 0340 08/24/12 0200  NA 134* 134*  < >  --  126* 130* 139 140 134*  K 6.2* 3.3*  < >  --  4.4 4.1 3.9 3.9 3.8  CL 97 100  < >  --  91* 94* 100 102 97  CO2 26 26  < >  --  24 23 24 25 24   GLUCOSE 142* 128*  < >  --  445* 367* 225* 232* 309*  BUN 22 19  < >  --  18 21 15 17 16   CREATININE 0.66 0.62  < >  --  1.07 1.14 0.79 0.83 0.82  CALCIUM 9.7 9.1  < >  --  8.9 8.9 9.2 9.5 9.4  MG  --  1.8  --  1.7  --   --   --   --   --   PHOS  --  0.7*  --  2.7  --   --   --   --   --   < > = values in this interval not displayed. Liver Function Tests:  Recent Labs Lab 08/18/12 1024 08/18/12 1400 08/22/12 0500  AST 36 44* 26  ALT 19 17 24   ALKPHOS 68 54 68  BILITOT 0.3 0.4 0.6  PROT 7.8 6.3 6.4  ALBUMIN 3.7 3.1* 2.7*   No results found for this basename: LIPASE, AMYLASE,  in the last 168 hours  Recent Labs Lab 08/18/12 1037  AMMONIA 36   CBC:  Recent Labs Lab 08/18/12 1024 08/18/12 1400 08/19/12 0430 08/20/12 0355 08/22/12 0500 08/24/12 0200  WBC 20.9* 15.5* 11.1* 10.5 10.7* 9.4  NEUTROABS 18.8*  --   --   --  8.0* 6.8  HGB 13.5 11.6* 11.1* 11.1* 10.9*  10.9*  HCT 39.5 33.4* 31.8* 31.6* 32.5* 31.3*  MCV 95.4 92.5 92.4 92.1 95.0 93.2  PLT 260 227 212 209 231 235   Cardiac Enzymes:  Recent Labs Lab 08/18/12 1036  TROPONINI <0.30   BNP (last 3 results) No results found for this basename: PROBNP,  in the last 8760 hours CBG:  Recent Labs Lab 08/23/12 0905 08/23/12 1135 08/23/12 1556 08/23/12 2203 08/24/12 0637  GLUCAP 205* 238* 233* 290* 261*    Recent Results (from the past 240 hour(s))  URINE CULTURE     Status: None   Collection Time    08/18/12 11:12 AM      Result Value Range Status   Specimen Description URINE, CATHETERIZED   Final   Special Requests Normal   Final   Culture  Setup Time 08/19/2012 12:54   Final   Colony Count NO GROWTH   Final   Culture NO GROWTH   Final   Report Status 08/20/2012 FINAL   Final  CULTURE, BLOOD (ROUTINE X 2)     Status: None   Collection Time    08/18/12  1:51 PM      Result Value Range Status   Specimen Description BLOOD RIGHT ARM   Final   Special Requests BOTTLES DRAWN AEROBIC ONLY 10CC   Final   Culture  Setup Time 08/18/2012 20:38   Final   Culture     Final   Value:        BLOOD CULTURE RECEIVED NO GROWTH TO DATE CULTURE WILL BE HELD FOR 5 DAYS BEFORE ISSUING A FINAL NEGATIVE REPORT   Report Status PENDING   Incomplete  CULTURE, BLOOD (ROUTINE X 2)     Status: None   Collection Time    08/18/12  2:00 PM      Result Value Range Status   Specimen Description BLOOD RIGHT ARM   Final   Special Requests BOTTLES DRAWN AEROBIC ONLY 5CC   Final   Culture  Setup Time 08/18/2012 20:37   Final   Culture     Final   Value:        BLOOD CULTURE RECEIVED NO GROWTH TO DATE CULTURE WILL BE HELD FOR 5 DAYS BEFORE ISSUING A FINAL NEGATIVE REPORT   Report Status PENDING   Incomplete  MRSA PCR SCREENING     Status: None   Collection Time    08/18/12  2:08 PM  Result Value Range Status   MRSA by PCR NEGATIVE  NEGATIVE Final   Comment:            The GeneXpert MRSA Assay (FDA      approved for NASAL specimens     only), is one component of a     comprehensive MRSA colonization     surveillance program. It is not     intended to diagnose MRSA     infection nor to guide or     monitor treatment for     MRSA infections.  CULTURE, RESPIRATORY (NON-EXPECTORATED)     Status: None   Collection Time    08/18/12  7:54 PM      Result Value Range Status   Specimen Description TRACHEAL ASPIRATE   Final   Special Requests NONE   Final   Gram Stain     Final   Value: MODERATE WBC PRESENT, PREDOMINANTLY PMN     NO SQUAMOUS EPITHELIAL CELLS SEEN     RARE GRAM POSITIVE COCCI     IN PAIRS   Culture Non-Pathogenic Oropharyngeal-type Flora Isolated.   Final   Report Status 08/21/2012 FINAL   Final     Studies: Dg Chest 2 View  08/24/2012   *RADIOLOGY REPORT*  Clinical Data: Fever, hypertension  CHEST - 2 VIEW  Comparison: Chest radiograph 08/20/2012  Findings: Normal mediastinum and cardiac silhouette.  Costophrenic angles are clear.  There are low lung volumes.  Mild central venous pulmonary congestion.  No focal consolidation. Small effusions noted.  IMPRESSION: Low lung volumes and central venous congestion.  Small effusions.   Original Report Authenticated By: Genevive Bi, M.D.   Ct Head W Wo Contrast  08/23/2012   *RADIOLOGY REPORT*  Clinical Data: Confusion, anoxic injury or tumor  CT HEAD WITHOUT AND WITH CONTRAST  Technique:  Contiguous axial images were obtained from the base of the skull through the vertex without and with intravenous contrast.  Contrast: 80mL OMNIPAQUE IOHEXOL 300 MG/ML  SOLN  Comparison: Head CT 08/18/2012  Findings: Non IV contrast images demonstrate hypodensities within the right thalamus which are not significantly changed in short interval.  There is no evidence of intra cranial hemorrhage.  No midline shift or mass effect.  Basil cisterns patent.  Contrast enhanced images demonstrates no enhancing parenchymal lesion.  Low density region within the  high left parietal lobe demonstrates no enhancement suggesting an infarction.  Paranasal sinuses and mastoid air cells are clear.  Orbits are normal.  There is some mucosal thickening within the ethmoid air cells and frontal sinuses.  IMPRESSION:  1.  No significant change. 2.  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 3.  Hypodense lesion within the left parietal lobe without enhancing component suggests infarction.   Original Report Authenticated By: Genevive Bi, M.D.    Scheduled Meds: . ciprofloxacin  400 mg Intravenous Q12H  . doxycycline (VIBRAMYCIN) IV  100 mg Intravenous Q12H  . feeding supplement  237 mL Oral TID BM  . heparin subcutaneous  5,000 Units Subcutaneous Q8H  . insulin aspart  0-15 Units Subcutaneous Q4H  . insulin detemir  10 Units Subcutaneous Daily  . pantoprazole (PROTONIX) IV  40 mg Intravenous QHS   Continuous Infusions: . 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 Thalamic infarction    Time spent: 20 minutes    Liyana Suniga K  [facility] Pager [phone]. If 7PM-7AM, please contact night-coverage at [email or web address], password [facility] 08/24/2012, 7:46 AM  LOS: 6 days

## 2012-08-24 NOTE — Progress Notes (Signed)
EEG completed.

## 2012-08-24 NOTE — Progress Notes (Signed)
Inpatient Diabetes Program Recommendations  AACE/ADA: New Consensus Statement on Inpatient Glycemic Control (2013)  Target Ranges:  Prepandial:   less than 140 mg/dL      Peak postprandial:   less than 180 mg/dL (1-2 hours)      Critically ill patients:  140 - 180 mg/dL     Results for Thomas Frye, Thomas Frye (MRN [id number]) as of 08/24/2012 14:08  Ref. Range 08/24/2012 06:37 08/24/2012 12:46  Glucose-Capillary Latest Range: 70-99 mg/dL 409 (H) 811 (H)    Noted Levemir started yesterday at 10 units.  Patient continues to have sustained hyperglycemia.    Noted patient admitted with severe hypoglycemia, however, patient does appear to need basal insulin.  Recommend the following insulin adjustments: 1. Increase Levemir to 20 units QHS  Will follow. Ambrose Finland RN, MSN, CDE Diabetes Coordinator Inpatient Diabetes Program [phone]

## 2012-08-24 NOTE — Progress Notes (Signed)
Physical Therapy Treatment Patient Details Name: Thomas Frye MRN: [id number] DOB: Sep 15, 1955 Today's Date: 08/24/2012 Time: 5284-1324 PT Time Calculation (min): 27 min  PT Assessment / Plan / Recommendation Comments on Treatment Session  Improved focus and following commands.  Emphasized standing and pregait activity.  Still safest with 2 person assist    Follow Up Recommendations  CIR     Does the patient have the potential to tolerate intense rehabilitation     Barriers to Discharge        Equipment Recommendations       Recommendations for Other Services Rehab consult  Frequency Min 3X/week   Plan Discharge plan remains appropriate    Precautions / Restrictions Precautions Precautions: Fall   Pertinent Vitals/Pain     Mobility  Bed Mobility Bed Mobility: Supine to Sit;Sitting - Scoot to Edge of Bed Supine to Sit: 3: Mod assist Sitting - Scoot to Edge of Bed: 4: Min assist Details for Bed Mobility Assistance: v/tc's for hand placement and direction Transfers Transfers: Sit to Stand;Stand to Sit;Stand Pivot Transfers Sit to Stand: 1: +2 Total assist Sit to Stand: Patient Percentage: 40% (to 50%) Stand to Sit: 1: +2 Total assist;With upper extremity assist;To bed;To chair/3-in-1 Stand to Sit: Patient Percentage: 40% Stand Pivot Transfers: 1: +2 Total assist Stand Pivot Transfers: Patient Percentage: 40% Details for Transfer Assistance: vc/tc's for hand placement/technique; assist to come forward, stabilie knees Ambulation/Gait Ambulation/Gait Assistance: Not tested (comment) Assistive device: Other (Comment) (tray table) Wheelchair Mobility Wheelchair Mobility: No Modified Rankin (Stroke Patients Only) Modified Rankin: Severe disability    Exercises     PT Diagnosis:    PT Problem List:   PT Treatment Interventions:     PT Goals Acute Rehab PT Goals Time For Goal Achievement: 09/05/12 Pt will go Supine/Side to Sit: with min assist PT Goal:  Supine/Side to Sit - Progress: Progressing toward goal Pt will Sit at Edge of Bed: with supervision PT Goal: Sit at Edge Of Bed - Progress: Partly met Pt will go Sit to Stand: with min assist PT Goal: Sit to Stand - Progress: Progressing toward goal Pt will Transfer Bed to Chair/Chair to Bed: with min assist PT Transfer Goal: Bed to Chair/Chair to Bed - Progress: Progressing toward goal Pt will Ambulate: 16 - 50 feet;with min assist PT Goal: Ambulate - Progress: Not met Pt will Perform Home Exercise Program: Independently PT Goal: Perform Home Exercise Program - Progress: Not met  Visit Information  Last PT Received On: 08/24/12 Assistance Needed: +2    Subjective Data  Subjective: Even more verbal and engaged Patient Stated Goal: none stated   Cognition  Cognition Arousal/Alertness: Awake/alert Overall Cognitive Status: Impaired/Different from baseline Area of Impairment: Attention;Memory;Awareness;Problem solving Orientation Level: Disoriented to;Time;Situation Current Attention Level: Sustained Memory: Decreased short-term memory Following Commands: Follows one step commands consistently    Balance  Balance Balance Assessed: Yes Static Sitting Balance Static Sitting - Balance Support: Feet supported Static Sitting - Level of Assistance: 5: Stand by assistance Dynamic Standing Balance Dynamic Standing - Balance Support: During functional activity;Bilateral upper extremity supported Dynamic Standing - Level of Assistance: 1: +2 Total assist;Patient percentage (comment) (pt ~50%)  End of Session PT - End of Session Activity Tolerance: Patient tolerated treatment well;Patient limited by fatigue Patient left: in chair;with call bell/phone within reach;with nursing in room;with family/visitor present Nurse Communication: Mobility status   GP     Keerthi Hazell, Eliseo Gum 08/24/2012, 2:11 PM 08/24/2012  Yardley Bing, PT [phone]  (  pager)

## 2012-08-24 NOTE — Progress Notes (Signed)
CIR Admissions: Noted Dr Riley Kill has evaluated pt for CIR. Patient out of room when I stopped by. Will follow-up on Monday to discuss possible admission to inpatient rehab. Have begun working on Therapist, occupational for CIR admission.  For questions: [phone]

## 2012-08-24 NOTE — Progress Notes (Signed)
Subjective: Continues to improve though still very confused.   Exam: Filed Vitals:   08/24/12 0910  BP: 145/71  Pulse: 92  Temp: 97.9 F (36.6 C)  Resp: 20   Gen: In bed, NAD MS: Awake, Alert, oriented to person and hospital but not Thomas Frye, gives year as 76.  YN:WGNFA, EOMI Motor: 5/5 throughout Sensory: intact to LT  Impression: 57 yo M With hypoglycemic encephalopathy. No focal findings to suggest that the thalamic infarct is new, I susepct related to small vessel disease. I also suspect that his left parietal finding is an old infarct, but given the atypical appearance repeat imaging in 2 - 3 months would be prudent.    Recommendations: 1) Continue therapies. 2) Would recommend repeat imaging in 2 - 3 months.  3) EEG could be helpful in determining level of encephalopathy  [name], MD [facility] [phone]  If 7pm- 7am, please page neurology on call at [phone].

## 2012-08-24 NOTE — Progress Notes (Signed)
Speech Language Pathology Treatment Patient Details Name: Thomas Frye MRN: [id number] DOB: 1956-03-13 Today's Date: 08/24/2012 Time: 1010-1037 SLP Time Calculation (min): 27 min  Assessment / Plan / Recommendation Clinical Impression  Pt continues to improve with cognitive-linguistic function.  Able to focus and sustain attention during 30 minute session.  Spontaneous verbal output is improving, revealing greater deficits in language.  Pt able to name to confrontation with 90% accuracy for functional objects in room; responsive naming with 80% accuracy; sentence description of pictures requires max assist for word retrieval and to reduce perseverations.  Pt showing limited recognition of errors; no attempts to self-correct.  Follows one-step commands with 80% accuracy; multi-step commands with max assist. Dysphagia is improving, with continued adequate attention to bolus and + airway protection noted with consumption of liquids. Pt demonstrates oral apraxia with mechanical consistencies with intermittent absence of chewing.  Rec full supervision with POs to ensure safety.  Educated pt/wife re: aphasia/cognitive deficits.      SLP Plan  Continue with current plan of care    Pertinent Vitals/Pain No pain  SLP Goals  SLP Goals Potential to Achieve Goals: Good SLP Goal #1: Pt will sustain attention to basic functional tasks for 15 seconds with max contextual cues.  SLP Goal #1 - Progress: Met SLP Goal #2: Pt will verbalize in automatic contexts with max verbal cues x3 in session.  SLP Goal #2 - Progress: Met SLP Goal #3: Pt will initiate communication of basic wants/needs with max contextual cues during functional task in 80% of opportunities.  SLP Goal #3 - Progress: Met SLP Goal #4: Pt will describe pictures in 3-5 word phrases with mod assist for word-retrieval. SLP Goal #4 - Progress: Progressing toward goal SLP Goal #5: Pt will follow two-step commands with 80% accuracy. SLP Goal #5  - Progress: Progressing toward goal  General Temperature Spikes Noted: No Respiratory Status: Room air Behavior/Cognition: Alert;Cooperative;Pleasant mood;Requires cueing Oral Cavity - Dentition: Adequate natural dentition Patient Positioning: Upright in bed  Oral Cavity - Oral Hygiene Does patient have any of the following "at risk" factors?: None of the above   Treatment Treatment focused on: Cognition;Aphasia;Other (comment) (dysphagia) Skilled Treatment: language facilitation, phonemic/semantic cues   GO     Carolan Shiver 08/24/2012, 10:55 AM

## 2012-08-25 DIAGNOSIS — G9340 Encephalopathy, unspecified: Secondary | ICD-10-CM

## 2012-08-25 DIAGNOSIS — J9600 Acute respiratory failure, unspecified whether with hypoxia or hypercapnia: Secondary | ICD-10-CM

## 2012-08-25 LAB — CBC WITH DIFFERENTIAL/PLATELET
Basophils Absolute: 0.1 10*3/uL (ref 0.0–0.1)
Basophils Relative: 1 % (ref 0–1)
Eosinophils Absolute: 0.2 10*3/uL (ref 0.0–0.7)
Eosinophils Relative: 3 % (ref 0–5)
Lymphs Abs: 1.3 10*3/uL (ref 0.7–4.0)
MCH: 32.2 pg (ref 26.0–34.0)
MCHC: 34.6 g/dL (ref 30.0–36.0)
MCV: 93 fL (ref 78.0–100.0)
Neutrophils Relative %: 70 % (ref 43–77)
Platelets: 242 10*3/uL (ref 150–400)
RDW: 12.9 % (ref 11.5–15.5)

## 2012-08-25 LAB — COMPREHENSIVE METABOLIC PANEL
ALT: 17 U/L (ref 0–53)
Albumin: 2.6 g/dL — ABNORMAL LOW (ref 3.5–5.2)
Calcium: 9.5 mg/dL (ref 8.4–10.5)
GFR calc Af Amer: 87 mL/min — ABNORMAL LOW (ref >90)
Glucose, Bld: 261 mg/dL — ABNORMAL HIGH (ref 70–99)
Sodium: 137 mEq/L (ref 135–145)
Total Protein: 6.5 g/dL (ref 6.0–8.3)

## 2012-08-25 LAB — GLUCOSE, CAPILLARY
Glucose-Capillary: 266 mg/dL — ABNORMAL HIGH (ref 70–99)
Glucose-Capillary: 236 mg/dL — ABNORMAL HIGH (ref 70–99)
Glucose-Capillary: 251 mg/dL — ABNORMAL HIGH (ref 70–99)

## 2012-08-25 LAB — LIPID PANEL
Cholesterol: 152 mg/dL (ref 0–200)
Total CHOL/HDL Ratio: 3.4 RATIO
Triglycerides: 168 mg/dL — ABNORMAL HIGH (ref <150)

## 2012-08-25 MED ORDER — PNEUMOCOCCAL VAC POLYVALENT 25 MCG/0.5ML IJ INJ
0.5000 mL | INJECTION | INTRAMUSCULAR | Status: AC
  Administered 2012-08-26: 0.5 mL via INTRAMUSCULAR
  Filled 2012-08-25: qty 0.5

## 2012-08-25 MED ORDER — PANTOPRAZOLE SODIUM 40 MG PO TBEC
40.0000 mg | DELAYED_RELEASE_TABLET | Freq: Every day | ORAL | Status: DC
  Administered 2012-08-25 – 2012-08-29 (×5): 40 mg via ORAL
  Filled 2012-08-25 (×5): qty 1

## 2012-08-25 MED ORDER — INSULIN DETEMIR 100 UNIT/ML ~~LOC~~ SOLN
25.0000 [IU] | Freq: Every day | SUBCUTANEOUS | Status: DC
  Administered 2012-08-25: 25 [IU] via SUBCUTANEOUS
  Filled 2012-08-25 (×4): qty 0.25

## 2012-08-25 MED ORDER — ESCITALOPRAM OXALATE 10 MG PO TABS
10.0000 mg | ORAL_TABLET | Freq: Every day | ORAL | Status: DC
  Administered 2012-08-25 – 2012-08-29 (×5): 10 mg via ORAL
  Filled 2012-08-25 (×6): qty 1

## 2012-08-25 MED ORDER — DOXYCYCLINE HYCLATE 100 MG PO TABS
100.0000 mg | ORAL_TABLET | Freq: Two times a day (BID) | ORAL | Status: DC
Start: 2012-08-25 — End: 2012-08-29
  Administered 2012-08-25 – 2012-08-29 (×8): 100 mg via ORAL
  Filled 2012-08-25 (×9): qty 1

## 2012-08-25 MED ORDER — CIPROFLOXACIN HCL 500 MG PO TABS
500.0000 mg | ORAL_TABLET | Freq: Two times a day (BID) | ORAL | Status: DC
  Administered 2012-08-25 – 2012-08-29 (×9): 500 mg via ORAL
  Filled 2012-08-25 (×11): qty 1

## 2012-08-25 NOTE — Progress Notes (Signed)
[facility] PROGRESS NOTE  Thomas Frye:[id number] DOB: November 10, 1955 DOA: 08/18/2012 PCP: CRAFT,PAUL E., PA-C  Assessment/Plan: Severe diabetic hypoglycemia  -resolved  -unsure if this was intentional as suicide attempt or accidental insulin OD  -psych following- pt was receiving OP tx for depression and was separated from his wife  Acute encephalopathy/ ? Thalamic infarction  -initially due to profound hypoglycemia and hypoventilation/hypoxia  -seems influenced now by suspected acute brain injury vs CVA  -Neuro following  -discussed with Neurology. Area of infarct likely not attributed to presenting encephalopathy. 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, limited to breakdown of skin  -cont Cipro and Vibramycin (was on preadmit by OP wound care)  -WOC RN eval  Depression: -Per Psych, OK to start antidepressants when cleared medically  Code Status: Full Family Communication: Patient in room, mother at bedside (indicate person spoken with, relationship, and if by phone, the number) Disposition Plan: Pending  Consultants: Neurology Psychiatry WOC  Procedures: 5-24 Lt i j cvl>>  5-24 OTT(EDP)>>08/21/12 08/24/12 EEG, unremarkable  Antibiotics: 5-24 ancef>>5/25  Zosyn 5/25>>5/27  cipr / doxy (chronic from home)>>>  HPI/Subjective: Patient is without complaints, states he feels better  Objective: Filed Vitals:   08/24/12 0910 08/24/12 1305 08/24/12 2110 08/25/12 0610   BP: 145/71 153/70 125/65 126/51  Pulse: 92 89 81 80  Temp: 97.9 F (36.6 C) 97.4 F (36.3 C) 97.9 F (36.6 C) 97.8 F (36.6 C)  TempSrc: Oral Oral Oral Oral  Resp: 20 18 18 20   Height:      Weight:      SpO2: 96% 95% 98% 95%    Intake/Output Summary (Last 24 hours) at 08/25/12 0738 Last data filed at 08/25/12 0704  Gross per 24 hour  Intake      0 ml  Output   2300 ml  Net  -2300 ml   Filed Weights   08/20/12 0500 08/21/12 0500 08/22/12 0500  Weight: 131.2 kg (289 lb 3.9 oz) 124 kg (273 lb 5.9 oz) 126.4 kg (278 lb 10.6 oz)    Exam:   General:  Awake, in NAD  Cardiovascular: regular, s1, s2  Respiratory: normal resp effort, no crackles or wheezing  Abdomen: soft, nondistended  Musculoskeletal: perfused, no clubbing   Data Reviewed: Basic Metabolic Panel:  Recent Labs Lab 08/18/12 1024 08/18/12 1400  08/19/12 0630  08/20/12 0355 08/22/12 0500 08/23/12 0340 08/24/12 0200 08/25/12 0500  NA 134* 134*  < >  --   < > 130* 139 140 134* 137  K 6.2* 3.3*  < >  --   < > 4.1 3.9 3.9 3.8 3.5  CL 97 100  < >  --   < > 94* 100 102 97 100  CO2 26 26  < >  --   < > 23 24 25 24 23   GLUCOSE 142* 128*  < >  --   < > 367* 225* 232* 309* 261*  BUN 22 19  < >  --   < > 21 15 17 16  24*  CREATININE 0.66 0.62  < >  --   < > 1.14 0.79 0.83 0.82 1.09  CALCIUM 9.7 9.1  < >  --   < > 8.9 9.2 9.5 9.4 9.5  MG  --  1.8  --  1.7  --   --   --   --   --   --   PHOS  --  0.7*  --  2.7  --   --   --   --   --   --   < > = values in this interval not displayed. Liver Function Tests:  Recent Labs Lab 08/18/12 1024 08/18/12 1400 08/22/12 0500 08/25/12 0500  AST 36 44* 26 16  ALT 19 17 24 17   ALKPHOS 68 54 68 109  BILITOT 0.3 0.4 0.6 0.4  PROT 7.8 6.3 6.4 6.5  ALBUMIN 3.7 3.1* 2.7* 2.6*   No results found for this basename: LIPASE, AMYLASE,  in the last 168 hours  Recent Labs Lab 08/18/12 1037  AMMONIA 36   CBC:  Recent Labs Lab 08/18/12 1024  08/19/12 0430  08/20/12 0355 08/22/12 0500 08/24/12 0200 08/25/12 0500  WBC 20.9*  < > 11.1* 10.5 10.7* 9.4 7.8  NEUTROABS 18.8*  --   --   --  8.0* 6.8 5.4  HGB 13.5  < > 11.1* 11.1* 10.9* 10.9* 11.1*  HCT 39.5  < > 31.8* 31.6* 32.5* 31.3* 32.1*  MCV 95.4  < > 92.4 92.1 95.0 93.2 93.0  PLT 260  < > 212 209 231 235 242  < > = values in this interval not displayed. Cardiac Enzymes:  Recent Labs Lab 08/18/12 1036  TROPONINI <0.30   BNP (last 3 results) No results found for this basename: PROBNP,  in the last 8760 hours CBG:  Recent Labs Lab 08/24/12 1246 08/24/12 1620 08/24/12 2140 08/25/12 0039 08/25/12 0621  GLUCAP 294* 341* 304* 241* 266*    Recent Results (from the past 240 hour(s))  URINE CULTURE     Status: None   Collection Time    08/18/12 11:12 AM      Result Value Range Status   Specimen Description URINE, CATHETERIZED   Final   Special Requests Normal   Final   Culture  Setup Time 08/19/2012 12:54   Final   Colony Count NO GROWTH   Final   Culture NO GROWTH   Final   Report Status 08/20/2012 FINAL   Final  CULTURE, BLOOD (ROUTINE X 2)     Status: None   Collection Time    08/18/12  1:51 PM      Result Value Range Status   Specimen Description BLOOD RIGHT ARM   Final   Special Requests BOTTLES DRAWN AEROBIC ONLY 10CC   Final   Culture  Setup Time 08/18/2012 20:38   Final   Culture NO GROWTH 5 DAYS   Final   Report Status 08/24/2012 FINAL   Final  CULTURE, BLOOD (ROUTINE X 2)     Status: None   Collection Time    08/18/12  2:00 PM      Result Value Range Status   Specimen Description BLOOD RIGHT ARM   Final   Special Requests BOTTLES DRAWN AEROBIC ONLY 5CC   Final   Culture  Setup Time 08/18/2012 20:37   Final   Culture NO GROWTH 5 DAYS   Final   Report Status 08/24/2012 FINAL   Final  MRSA PCR SCREENING     Status: None  Collection Time    08/18/12  2:08 PM      Result Value Range Status   MRSA by PCR NEGATIVE  NEGATIVE Final   Comment:            The  GeneXpert MRSA Assay (FDA     approved for NASAL specimens     only), is one component of a     comprehensive MRSA colonization     surveillance program. It is not     intended to diagnose MRSA     infection nor to guide or     monitor treatment for     MRSA infections.  CULTURE, RESPIRATORY (NON-EXPECTORATED)     Status: None   Collection Time    08/18/12  7:54 PM      Result Value Range Status   Specimen Description TRACHEAL ASPIRATE   Final   Special Requests NONE   Final   Gram Stain     Final   Value: MODERATE WBC PRESENT, PREDOMINANTLY PMN     NO SQUAMOUS EPITHELIAL CELLS SEEN     RARE GRAM POSITIVE COCCI     IN PAIRS   Culture Non-Pathogenic Oropharyngeal-type Flora Isolated.   Final   Report Status 08/21/2012 FINAL   Final     Studies: Dg Chest 2 View  08/24/2012   *RADIOLOGY REPORT*  Clinical Data: Fever, hypertension  CHEST - 2 VIEW  Comparison: Chest radiograph 08/20/2012  Findings: Normal mediastinum and cardiac silhouette.  Costophrenic angles are clear.  There are low lung volumes.  Mild central venous pulmonary congestion.  No focal consolidation. Small effusions noted.  IMPRESSION: Low lung volumes and central venous congestion.  Small effusions.   Original Report Authenticated By: Genevive Bi, M.D.   Ct Head W Wo Contrast  08/23/2012   *RADIOLOGY REPORT*  Clinical Data: Confusion, anoxic injury or tumor  CT HEAD WITHOUT AND WITH CONTRAST  Technique:  Contiguous axial images were obtained from the base of the skull through the vertex without and with intravenous contrast.  Contrast: 80mL OMNIPAQUE IOHEXOL 300 MG/ML  SOLN  Comparison: Head CT 08/18/2012  Findings: Non IV contrast images demonstrate hypodensities within the right thalamus which are not significantly changed in short interval.  There is no evidence of intra cranial hemorrhage.  No midline shift or mass effect.  Basil cisterns patent.  Contrast enhanced images demonstrates no enhancing parenchymal lesion.   Low density region within the high left parietal lobe demonstrates no enhancement suggesting an infarction.  Paranasal sinuses and mastoid air cells are clear.  Orbits are normal.  There is some mucosal thickening within the ethmoid air cells and frontal sinuses.  IMPRESSION:  1.  No significant change. 2.  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 3.  Hypodense lesion within the left parietal lobe without enhancing component suggests infarction.   Original Report Authenticated By: Genevive Bi, M.D.    Scheduled Meds: . aspirin EC  81 mg Oral Daily  . ciprofloxacin  400 mg Intravenous Q12H  . doxycycline (VIBRAMYCIN) IV  100 mg Intravenous Q12H  . feeding supplement  237 mL Oral TID BM  . heparin subcutaneous  5,000 Units Subcutaneous Q8H  . insulin aspart  0-15 Units Subcutaneous Q4H  . insulin detemir  10 Units Subcutaneous Daily  . lisinopril  10 mg Oral Daily  . pantoprazole (PROTONIX) IV  40 mg Intravenous QHS  . [START ON 08/26/2012] pneumococcal 23 valent vaccine  0.5 mL Intramuscular Tomorrow-1000   Continuous Infusions: . 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 Thalamic infarction    Time spent:    Loray Akard K  [facility] Pager [phone]. If 7PM-7AM, please contact night-coverage at [email or web address], password [facility] 08/25/2012, 7:38 AM  LOS: 7 days

## 2012-08-25 NOTE — Progress Notes (Signed)
Subjective: Making mild improvement.   Exam: Filed Vitals:   08/25/12 0931  BP: 106/80  Pulse: 88  Temp: 97.6 F (36.4 C)  Resp: 18   Gen: In bed, NAD MS: Awake, Alert, oriented to person and hospital but not [place], gives year as 2014.  ZO:XWRUE, EOMI Motor: 5/5 throughout Sensory: intact to LT  Impression: 57 yo M With hypoglycemic encephalopathy. No focal findings to suggest that the thalamic infarct is new, I suspect related to small vessel disease. 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   Also, it does not appear that he has ever had a workup for his previous strokes, and though I do not feel that they are related to his current hospitalization, risk factor screening I feel would be prudent.   Recommendations: 1) Continue therapies. 2) I have started ASA.  3) doppler, echo 4) LDL pending, known diabetes.  5) Recommend normotension as goal for BP.   [name], MD [facility] [phone]  If 7pm- 7am, please page neurology on call at [phone].

## 2012-08-25 NOTE — Progress Notes (Signed)
PT was placed on home CPAP with 2L O2 bleed in

## 2012-08-26 DIAGNOSIS — I517 Cardiomegaly: Secondary | ICD-10-CM

## 2012-08-26 DIAGNOSIS — I6350 Cerebral infarction due to unspecified occlusion or stenosis of unspecified cerebral artery: Secondary | ICD-10-CM

## 2012-08-26 LAB — GLUCOSE, CAPILLARY
Glucose-Capillary: 214 mg/dL — ABNORMAL HIGH (ref 70–99)
Glucose-Capillary: 263 mg/dL — ABNORMAL HIGH (ref 70–99)

## 2012-08-26 MED ORDER — TAMSULOSIN HCL 0.4 MG PO CAPS
0.4000 mg | ORAL_CAPSULE | Freq: Every day | ORAL | Status: DC
  Administered 2012-08-26 – 2012-08-29 (×4): 0.4 mg via ORAL
  Filled 2012-08-26 (×4): qty 1

## 2012-08-26 MED ORDER — DOCUSATE SODIUM 100 MG PO CAPS
100.0000 mg | ORAL_CAPSULE | Freq: Two times a day (BID) | ORAL | Status: DC
  Administered 2012-08-26 – 2012-08-29 (×6): 100 mg via ORAL
  Filled 2012-08-26 (×6): qty 1

## 2012-08-26 MED ORDER — SIMVASTATIN 10 MG PO TABS
10.0000 mg | ORAL_TABLET | Freq: Every day | ORAL | Status: DC
  Administered 2012-08-27 – 2012-08-29 (×3): 10 mg via ORAL
  Filled 2012-08-26 (×3): qty 1

## 2012-08-26 MED ORDER — TRIAMCINOLONE ACETONIDE 0.5 % EX CREA
TOPICAL_CREAM | Freq: Three times a day (TID) | CUTANEOUS | Status: DC
  Administered 2012-08-27 – 2012-08-29 (×5): via TOPICAL
  Filled 2012-08-26 (×2): qty 15

## 2012-08-26 MED ORDER — INSULIN DETEMIR 100 UNIT/ML ~~LOC~~ SOLN
30.0000 [IU] | Freq: Every day | SUBCUTANEOUS | Status: DC
  Administered 2012-08-26: 30 [IU] via SUBCUTANEOUS
  Filled 2012-08-26 (×2): qty 0.3

## 2012-08-26 MED ORDER — DIPHENHYDRAMINE HCL 25 MG PO CAPS
25.0000 mg | ORAL_CAPSULE | Freq: Four times a day (QID) | ORAL | Status: DC | PRN
  Administered 2012-08-26 – 2012-08-29 (×2): 25 mg via ORAL
  Filled 2012-08-26 (×2): qty 1

## 2012-08-26 NOTE — Progress Notes (Signed)
[facility] PROGRESS NOTE  Thomas Frye EAV:[id number] DOB: 08-Oct-1955 DOA: 08/18/2012 PCP: CRAFT,PAUL E., PA-C  Assessment/Plan: Severe diabetic hypoglycemia  -resolved  -BS in the mid-200 range. Will increase levemir to 35units, cont SSI -unsure if this was intentional as suicide attempt or accidental insulin OD  -psych following- pt was receiving OP tx for depression and was separated from his wife   Acute encephalopathy/ ? Thalamic infarction  -initially due to profound hypoglycemia and hypoventilation/hypoxia  -seems influenced now by suspected acute brain injury vs CVA  -Neuro following  -EEG unremarkable  -PT and OT evaluations- CIR recommended and consulted -Carotid dopplers and 2d echo pending   Dysphagia  -SLP following  -Advance as tolerated   Acute respiratory failure in setting of profound hypoglycemia  -resolved   DM (diabetes mellitus), type 2 with peripheral vascular complications  -now uncontrolled with CBG > 200  -will increase Levemir to 35units and cont SSI  -A1c of 7.4   HTN (hypertension)  -Renal fx unremarkable  -BP suboptimal -Lisinopril added back per home regimen   Noncompliance   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, limited to breakdown of skin  -cont Cipro and Vibramycin (was on preadmit by OP wound care)  -WOC RN eval   Depression:  -Per Psych, OK to start antidepressants when cleared medically -Lexapro resumed  Code Status: Full Family Communication: Pt in room Disposition Plan: Pending   Consultants:  Neurology  Psychiatry  WOC  Procedures: 5-24 Lt i j cvl>>  5-24 OTT(EDP)>>08/21/12  08/24/12 EEG, unremarkable   Antibiotics: 5-24 ancef>>5/25  Zosyn 5/25>>5/27  cipr / doxy (chronic from home)>>>  HPI/Subjective: No complaints this AM  Objective: Filed Vitals:   08/25/12 1726 08/25/12 2111 08/25/12 2245 08/26/12 0518  BP: 119/61 140/65  166/80  Pulse: 85 87 87 82  Temp: 97.4 F (36.3 C)  97.6 F (36.4 C)  98 F (36.7 C)  TempSrc: Oral Oral  Oral  Resp: 18 20 20 20   Height:      Weight:      SpO2: 95% 94% 95% 96%    Intake/Output Summary (Last 24 hours) at 08/26/12 0759 Last data filed at 08/26/12 0520  Gross per 24 hour  Intake    600 ml  Output   2750 ml  Net  -2150 ml   Filed Weights   08/20/12 0500 08/21/12 0500 08/22/12 0500  Weight: 131.2 kg (289 lb 3.9 oz) 124 kg (273 lb 5.9 oz) 126.4 kg (278 lb 10.6 oz)    Exam:   General:  Awake, in nad  Cardiovascular: regular, s1, s2  Respiratory: normal resp effort, no crackles or wheezing  Abdomen: soft, nondistended  Musculoskeletal: perfused, no clubbing   Data Reviewed: Basic Metabolic Panel:  Recent Labs Lab 08/20/12 0355 08/22/12 0500 08/23/12 0340 08/24/12 0200 08/25/12 0500  NA 130* 139 140 134* 137  K 4.1 3.9 3.9 3.8 3.5  CL 94* 100 102 97 100  CO2 23 24 25 24 23   GLUCOSE 367* 225* 232* 309* 261*  BUN 21 15 17 16  24*  CREATININE 1.14 0.79 0.83 0.82 1.09  CALCIUM 8.9 9.2 9.5 9.4 9.5   Liver Function Tests:  Recent Labs Lab 08/22/12 0500 08/25/12 0500  AST 26 16  ALT 24 17  ALKPHOS 68 109  BILITOT 0.6 0.4  PROT 6.4 6.5  ALBUMIN 2.7* 2.6*   No results found for this basename: LIPASE, AMYLASE,  in the last 168 hours No results found for this basename:  AMMONIA,  in the last 168 hours CBC:  Recent Labs Lab 08/20/12 0355 08/22/12 0500 08/24/12 0200 08/25/12 0500  WBC 10.5 10.7* 9.4 7.8  NEUTROABS  --  8.0* 6.8 5.4  HGB 11.1* 10.9* 10.9* 11.1*  HCT 31.6* 32.5* 31.3* 32.1*  MCV 92.1 95.0 93.2 93.0  PLT 209 231 235 242   Cardiac Enzymes: No results found for this basename: CKTOTAL, CKMB, CKMBINDEX, TROPONINI,  in the last 168 hours BNP (last 3 results) No results found for this basename: PROBNP,  in the last 8760 hours CBG:  Recent Labs Lab 08/25/12 1221 08/25/12 1628 08/25/12 2021 08/26/12 0025 08/26/12 0403  GLUCAP 320* 236* 251* 263* 190*    Recent  Results (from the past 240 hour(s))  URINE CULTURE     Status: None   Collection Time    08/18/12 11:12 AM      Result Value Range Status   Specimen Description URINE, CATHETERIZED   Final   Special Requests Normal   Final   Culture  Setup Time 08/19/2012 12:54   Final   Colony Count NO GROWTH   Final   Culture NO GROWTH   Final   Report Status 08/20/2012 FINAL   Final  CULTURE, BLOOD (ROUTINE X 2)     Status: None   Collection Time    08/18/12  1:51 PM      Result Value Range Status   Specimen Description BLOOD RIGHT ARM   Final   Special Requests BOTTLES DRAWN AEROBIC ONLY 10CC   Final   Culture  Setup Time 08/18/2012 20:38   Final   Culture NO GROWTH 5 DAYS   Final   Report Status 08/24/2012 FINAL   Final  CULTURE, BLOOD (ROUTINE X 2)     Status: None   Collection Time    08/18/12  2:00 PM      Result Value Range Status   Specimen Description BLOOD RIGHT ARM   Final   Special Requests BOTTLES DRAWN AEROBIC ONLY 5CC   Final   Culture  Setup Time 08/18/2012 20:37   Final   Culture NO GROWTH 5 DAYS   Final   Report Status 08/24/2012 FINAL   Final  MRSA PCR SCREENING     Status: None   Collection Time    08/18/12  2:08 PM      Result Value Range Status   MRSA by PCR NEGATIVE  NEGATIVE Final   Comment:            The GeneXpert MRSA Assay (FDA     approved for NASAL specimens     only), is one component of a     comprehensive MRSA colonization     surveillance program. It is not     intended to diagnose MRSA     infection nor to guide or     monitor treatment for     MRSA infections.  CULTURE, RESPIRATORY (NON-EXPECTORATED)     Status: None   Collection Time    08/18/12  7:54 PM      Result Value Range Status   Specimen Description TRACHEAL ASPIRATE   Final   Special Requests NONE   Final   Gram Stain     Final   Value: MODERATE WBC PRESENT, PREDOMINANTLY PMN     NO SQUAMOUS EPITHELIAL CELLS SEEN     RARE GRAM POSITIVE COCCI     IN PAIRS   Culture Non-Pathogenic  Oropharyngeal-type Flora Isolated.   Final  Report Status 08/21/2012 FINAL   Final  CULTURE, BLOOD (ROUTINE X 2)     Status: None   Collection Time    08/24/12  2:45 AM      Result Value Range Status   Specimen Description BLOOD RIGHT ARM   Final   Special Requests     Final   Value: BOTTLES DRAWN AEROBIC AND ANAEROBIC 10CC,PT ON CIPROFLOXACIN,DOXYCYCLINE   Culture  Setup Time 08/24/2012 10:32   Final   Culture     Final   Value:        BLOOD CULTURE RECEIVED NO GROWTH TO DATE CULTURE WILL BE HELD FOR 5 DAYS BEFORE ISSUING A FINAL NEGATIVE REPORT   Report Status PENDING   Incomplete  CULTURE, BLOOD (ROUTINE X 2)     Status: None   Collection Time    08/24/12  2:50 AM      Result Value Range Status   Specimen Description BLOOD LEFT HAND   Final   Special Requests     Final   Value: BOTTLES DRAWN AEROBIC ONLY 10CC,PT ON CIPROFLOXACIN,DOXYCYCLINE   Culture  Setup Time 08/24/2012 10:30   Final   Culture     Final   Value:        BLOOD CULTURE RECEIVED NO GROWTH TO DATE CULTURE WILL BE HELD FOR 5 DAYS BEFORE ISSUING A FINAL NEGATIVE REPORT   Report Status PENDING   Incomplete     Studies: No results found.  Scheduled Meds: . aspirin EC  81 mg Oral Daily  . ciprofloxacin  500 mg Oral BID  . doxycycline  100 mg Oral Q12H  . escitalopram  10 mg Oral Daily  . feeding supplement  237 mL Oral TID BM  . heparin subcutaneous  5,000 Units Subcutaneous Q8H  . insulin aspart  0-15 Units Subcutaneous Q4H  . insulin detemir  25 Units Subcutaneous Daily  . lisinopril  10 mg Oral Daily  . pantoprazole  40 mg Oral Q1200  . pneumococcal 23 valent vaccine  0.5 mL Intramuscular Tomorrow-1000   Continuous Infusions: . 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 Thalamic infarction    Time spent:    Jaylen Claude K  [facility] Pager [phone]. If 7PM-7AM, please contact night-coverage at [email or web address], password [facility] 08/26/2012, 7:59 AM  LOS: 8 days

## 2012-08-26 NOTE — Progress Notes (Signed)
  Echocardiogram 2D Echocardiogram has been performed.  Thomas Frye [name] 08/26/2012, 1:27 PM

## 2012-08-27 LAB — GLUCOSE, CAPILLARY
Glucose-Capillary: 205 mg/dL — ABNORMAL HIGH (ref 70–99)
Glucose-Capillary: 216 mg/dL — ABNORMAL HIGH (ref 70–99)

## 2012-08-27 MED ORDER — INSULIN DETEMIR 100 UNIT/ML ~~LOC~~ SOLN
40.0000 [IU] | Freq: Every day | SUBCUTANEOUS | Status: DC
  Administered 2012-08-27: 40 [IU] via SUBCUTANEOUS
  Filled 2012-08-27 (×2): qty 0.4

## 2012-08-27 MED ORDER — TAMSULOSIN HCL 0.4 MG PO CAPS: 0.4000 mg | ORAL_CAPSULE | Freq: Every day | ORAL | Status: DC

## 2012-08-27 NOTE — Clinical Social Work Psych Note (Signed)
Psych was re-consulted re: psych disposition upon d/c.  Psychiatrist re-assessed pt on 08/23/2012 5:24PM and made requested recommendations.    The recommendations were as follows: Psychiatry Recommendation:  #1. May start antidepressant medication when medically cleared  #2. Discontinue sitter for safety  #3. Appreciate psych social service visit and involvement  #4. Appreciate psychiatric consultation and followup as needed  Please re-consult psych as necessary.  Psych CSW signing off.

## 2012-08-27 NOTE — Progress Notes (Signed)
Pt was found on his knees on the side of the bed. Rn asked patient how he got there 'Pt stated I don't know". Patient was helped back to bed. Nurse assessed the patient, alert ,but weak, no bruises/wounds. Patient denies any pain. Checked vitals and CBG, everything is within normal limits. MD notified. Placed patient on bed alarm. Reeducated patient and family and they he demonstrated the use of call bell and door was left open for better visual of patient's room.Call bell within reach. Family notified of fall.

## 2012-08-27 NOTE — Progress Notes (Signed)
Physical Therapy Treatment Patient Details Name: Thomas Frye MRN: [id number] DOB: [birth year] Today's Date: 08/27/2012 Time: 4098-1191 PT Time Calculation (min): 24 min  PT Assessment / Plan / Recommendation Comments on Treatment Session  57 y.o. male admitted to [facility] with hypoglycemic event, VDRF, acute encephalopathy vs thalamic infarct, HTN, dysphasia, chronic right foot ulcer.  He presents today mobilizing better.  It only required one person assist to get from bed to chair on his right wiht heavy reliance on upper extremity support to get to the chair.  I think he is ready to try standing with a RW next session and start pre-gait and/or gait activities.  I continue to think that he is an excellent inpateint rehab candidate.     Follow Up Recommendations  CIR     Does the patient have the potential to tolerate intense rehabilitation    yes  Barriers to Discharge   none      Equipment Recommendations  Rolling walker with 5" wheels;Wheelchair (measurements PT);Wheelchair cushion (measurements PT)    Recommendations for Other Services   none  Frequency Min 4X/week   Plan Discharge plan remains appropriate;Frequency needs to be updated    Precautions / Restrictions Precautions Precautions: Fall Required Braces or Orthoses: Other Brace/Splint Other Brace/Splint: CAM boot right ankle.   Restrictions Weight Bearing Restrictions: No Other Position/Activity Restrictions: Has blue PRAFO for in the bed.     Pertinent Vitals/Pain See vitals flow sheet.     Mobility  Bed Mobility Bed Mobility: Supine to Sit;Sitting - Scoot to Edge of Bed Supine to Sit: 5: Supervision;With rails;HOB elevated Sitting - Scoot to Edge of Bed: 5: Supervision;With rail Details for Bed Mobility Assistance: supervision for bed mobility for safety.  Pt relied heavily on railing to get to sitting.  Needed extra time to complete task, but was able to do it without external assist to right side of the bed.    Transfers Transfers: Sit to Stand;Stand to Sit;Stand Pivot Transfers Sit to Stand: 3: Mod assist;With upper extremity assist;From bed Stand to Sit: 3: Mod assist;With upper extremity assist;With armrests;To chair/3-in-1 Stand Pivot Transfers: 3: Mod assist;From elevated surface Details for Transfer Assistance: mod assist to stand from elevated bed using bed rail for support and opposite hand resting on PT's shoulder.  Left knee blocked by therapist.  Stood x 2 to assess safety for one person assist transfer.  Pt able to reach for the opposite chair armrest on his right and with help from therapist stand and turn hips without moving feet to get into chair.  Pt was trying to initiate stepping, but unable to .  CAM boot was donned in the bed before feet even touched the ground.        PT Goals Acute Rehab PT Goals Potential to Achieve Goals: Good Pt will go Supine/Side to Sit: with modified independence;with HOB 0 degrees PT Goal: Supine/Side to Sit - Progress: Updated due to goal met Pt will Sit at [facility] of Bed: with modified independence;3-5 min;with no upper extremity support PT Goal: Sit at Edge Of Bed - Progress: Updated due to goal met PT Goal: Sit to Stand - Progress: Progressing toward goal PT Transfer Goal: Bed to Chair/Chair to Bed - Progress: Progressing toward goal  Visit Information  Last PT Received On: 08/27/12 Assistance Needed: +2    Subjective Data  Subjective: Pt having expressive difficulties, but trying to speak.  Does well with yes/no questions.     Cognition  Cognition Arousal/Alertness: Awake/alert  Behavior During Therapy: WFL for tasks assessed/performed Overall Cognitive Status: Impaired/Different from baseline Area of Impairment: Attention;Memory;Safety/judgement;Awareness;Problem solving Orientation Level: Time;Situation Current Attention Level: Sustained Memory: Decreased recall of precautions Following Commands: Follows one step commands  consistently Safety/Judgement: Decreased awareness of safety;Decreased awareness of deficits Awareness: Intellectual Problem Solving: Slow processing;Decreased initiation General Comments: PT unable to report what R CAM boot was for, he could tell me that his left side felt weaker than his right side.  Per other therapist report, the patient fell earlier today trying to get out of bed on his own.      Balance  Static Sitting Balance Static Sitting - Balance Support: Feet supported Static Sitting - Level of Assistance: 5: Stand by assistance Dynamic Standing Balance Dynamic Standing - Balance Support: Bilateral upper extremity supported Dynamic Standing - Level of Assistance: 3: Mod assist  End of Session PT - End of Session Equipment Utilized During Treatment: Gait belt Activity Tolerance: Patient limited by fatigue Patient left: in chair;with call bell/phone within reach;with chair alarm set    Carrisa Keller B. Kynsli Haapala, PT, DPT [phone]   08/27/2012, 2:46 PM

## 2012-08-27 NOTE — Progress Notes (Signed)
Inpatient Diabetes Program Recommendations  AACE/ADA: New Consensus Statement on Inpatient Glycemic Control (2013)  Target Ranges:  Prepandial:   less than 140 mg/dL      Peak postprandial:   less than 180 mg/dL (1-2 hours)      Critically ill patients:  140 - 180 mg/dL   Reason for Assessment: Hyperglycemia after prolonged hypoglycemia from too much insulin prior to admission  Results for LAMON, ROTUNDO (MRN [id number]) as of 08/27/2012 15:57  Ref. Range 08/26/2012 08:04 08/26/2012 11:24 08/26/2012 11:58 08/26/2012 15:34 08/26/2012 16:40 08/26/2012 19:31 08/26/2012 23:19 08/27/2012 03:36 08/27/2012 09:51 08/27/2012 11:55  Glucose-Capillary Latest Range: 70-99 mg/dL 409 (H)  811 (H)  914 (H) 210 (H) 205 (H) 202 (H) 327 (H) 260 (H)   Note:  Levemir dosage increased to 40 units daily.  Meal intake variable-- from 5 to 100%.  Request MD consider ordering meal coverage Novolog 10 units tid with meals to be held if CBG < 80 mg/dl or patient eats <78% of meal.  Thank you.  Darek Eifler S. Elsie Lincoln, RN, CNS, CDE Inpatient Diabetes Program, team pager [phone]

## 2012-08-27 NOTE — Progress Notes (Signed)
[facility] PROGRESS NOTE  Thomas Frye:[id number] DOB: 1955/04/09 DOA: 08/18/2012 PCP: CRAFT,PAUL E., PA-C  Assessment/Plan: Severe diabetic hypoglycemia  -resolved  -BS in the mid-200 range. Will increase levemir to 35units, cont SSI  -unsure if this was intentional as suicide attempt or accidental insulin OD  -psych following- pt was receiving OP tx for depression and was separated from his wife   Acute encephalopathy/ ? Thalamic infarction  -initially due to profound hypoglycemia and hypoventilation/hypoxia  -Neuro following  -EEG unremarkable  -PT and OT evaluations- CIR recommended and consulted  -Carotid dopplers and 2d echo pending   Dysphagia  -SLP following  -Advance as tolerated   Acute respiratory failure in setting of profound hypoglycemia  -resolved   DM (diabetes mellitus), type 2 with peripheral vascular complications  -now uncontrolled with CBG > 200  -will increase Levemir to 40units and cont SSI  -A1c of 7.4   HTN (hypertension)  -Renal fx unremarkable  -BP suboptimal -Lisinopril added back per home regimen   Noncompliance   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 Night staff reports 600cc urine via in/out cath. Has not yet voided on own. -On flomax -If no improvement, may consider Urology f/u for bladder training  Code Status: Full Family Communication: Pt and father at bedside (indicate person spoken with, relationship, and if by phone, the number) Disposition Plan: Pending   Consultants:  Psychiatry  Neurology  Procedures: 5-24 Lt i j cvl>>  5-24 OTT(EDP)>>08/21/12  08/24/12 EEG, unremarkable  Antibiotics: 5-24 ancef>>5/25  Zosyn 5/25>>5/27  cipr / doxy  (chronic from home)>>>   HPI/Subjective: Pt w/o acute events overnight. No complaints.  Objective: Filed Vitals:   08/26/12 0518 08/26/12 1346 08/26/12 2200 08/27/12 0600  BP: 166/80 123/70 147/74 143/74  Pulse: 82 86 80 85  Temp: 98 F (36.7 C) 98.6 F (37 C) 97.5 F (36.4 C) 97.6 F (36.4 C)  TempSrc: Oral Oral Oral Oral  Resp: 20 20 20 20   Height:      Weight:      SpO2: 96% 95% 98% 98%    Intake/Output Summary (Last 24 hours) at 08/27/12 0747 Last data filed at 08/27/12 0430  Gross per 24 hour  Intake      0 ml  Output   2200 ml  Net  -2200 ml   Filed Weights   08/20/12 0500 08/21/12 0500 08/22/12 0500  Weight: 131.2 kg (289 lb 3.9 oz) 124 kg (273 lb 5.9 oz) 126.4 kg (278 lb 10.6 oz)    Exam:   General:  Awake, in NAD  Cardiovascular: regular, s1, s2  Respiratory: normal resp effort, no crackles  Abdomen: soft, nondistended  Musculoskeletal: perfused, no clubbing   Data Reviewed: Basic Metabolic Panel:  Recent Labs Lab 08/22/12 0500 08/23/12 0340 08/24/12 0200 08/25/12 0500  NA 139 140 134* 137  K 3.9 3.9 3.8 3.5  CL 100 102 97 100  CO2 24 25 24 23   GLUCOSE 225* 232* 309* 261*  BUN 15 17 16  24*  CREATININE 0.79 0.83 0.82 1.09  CALCIUM 9.2 9.5 9.4 9.5   Liver Function Tests:  Recent Labs Lab 08/22/12 0500 08/25/12 0500  AST 26 16  ALT 24 17  ALKPHOS 68 109  BILITOT  0.6 0.4  PROT 6.4 6.5  ALBUMIN 2.7* 2.6*   No results found for this basename: LIPASE, AMYLASE,  in the last 168 hours No results found for this basename: AMMONIA,  in the last 168 hours CBC:  Recent Labs Lab 08/22/12 0500 08/24/12 0200 08/25/12 0500  WBC 10.7* 9.4 7.8  NEUTROABS 8.0* 6.8 5.4  HGB 10.9* 10.9* 11.1*  HCT 32.5* 31.3* 32.1*  MCV 95.0 93.2 93.0  PLT 231 235 242   Cardiac Enzymes: No results found for this basename: CKTOTAL, CKMB, CKMBINDEX, TROPONINI,  in the last 168 hours BNP (last 3 results) No results found for this basename: PROBNP,  in  the last 8760 hours CBG:  Recent Labs Lab 08/26/12 0025 08/26/12 0403 08/26/12 0804 08/26/12 1158 08/26/12 1640  GLUCAP 263* 190* 252* 257* 214*    Recent Results (from the past 240 hour(s))  URINE CULTURE     Status: None   Collection Time    08/18/12 11:12 AM      Result Value Range Status   Specimen Description URINE, CATHETERIZED   Final   Special Requests Normal   Final   Culture  Setup Time 08/19/2012 12:54   Final   Colony Count NO GROWTH   Final   Culture NO GROWTH   Final   Report Status 08/20/2012 FINAL   Final  CULTURE, BLOOD (ROUTINE X 2)     Status: None   Collection Time    08/18/12  1:51 PM      Result Value Range Status   Specimen Description BLOOD RIGHT ARM   Final   Special Requests BOTTLES DRAWN AEROBIC ONLY 10CC   Final   Culture  Setup Time 08/18/2012 20:38   Final   Culture NO GROWTH 5 DAYS   Final   Report Status 08/24/2012 FINAL   Final  CULTURE, BLOOD (ROUTINE X 2)     Status: None   Collection Time    08/18/12  2:00 PM      Result Value Range Status   Specimen Description BLOOD RIGHT ARM   Final   Special Requests BOTTLES DRAWN AEROBIC ONLY 5CC   Final   Culture  Setup Time 08/18/2012 20:37   Final   Culture NO GROWTH 5 DAYS   Final   Report Status 08/24/2012 FINAL   Final  MRSA PCR SCREENING     Status: None   Collection Time    08/18/12  2:08 PM      Result Value Range Status   MRSA by PCR NEGATIVE  NEGATIVE Final   Comment:            The GeneXpert MRSA Assay (FDA     approved for NASAL specimens     only), is one component of a     comprehensive MRSA colonization     surveillance program. It is not     intended to diagnose MRSA     infection nor to guide or     monitor treatment for     MRSA infections.  CULTURE, RESPIRATORY (NON-EXPECTORATED)     Status: None   Collection Time    08/18/12  7:54 PM      Result Value Range Status   Specimen Description TRACHEAL ASPIRATE   Final   Special Requests NONE   Final   Gram Stain      Final   Value: MODERATE WBC PRESENT, PREDOMINANTLY PMN     NO SQUAMOUS EPITHELIAL CELLS SEEN  RARE GRAM POSITIVE COCCI     IN PAIRS   Culture Non-Pathogenic Oropharyngeal-type Flora Isolated.   Final   Report Status 08/21/2012 FINAL   Final  CULTURE, BLOOD (ROUTINE X 2)     Status: None   Collection Time    08/24/12  2:45 AM      Result Value Range Status   Specimen Description BLOOD RIGHT ARM   Final   Special Requests     Final   Value: BOTTLES DRAWN AEROBIC AND ANAEROBIC 10CC,PT ON CIPROFLOXACIN,DOXYCYCLINE   Culture  Setup Time 08/24/2012 10:32   Final   Culture     Final   Value:        BLOOD CULTURE RECEIVED NO GROWTH TO DATE CULTURE WILL BE HELD FOR 5 DAYS BEFORE ISSUING A FINAL NEGATIVE REPORT   Report Status PENDING   Incomplete  CULTURE, BLOOD (ROUTINE X 2)     Status: None   Collection Time    08/24/12  2:50 AM      Result Value Range Status   Specimen Description BLOOD LEFT HAND   Final   Special Requests     Final   Value: BOTTLES DRAWN AEROBIC ONLY 10CC,PT ON CIPROFLOXACIN,DOXYCYCLINE   Culture  Setup Time 08/24/2012 10:30   Final   Culture     Final   Value:        BLOOD CULTURE RECEIVED NO GROWTH TO DATE CULTURE WILL BE HELD FOR 5 DAYS BEFORE ISSUING A FINAL NEGATIVE REPORT   Report Status PENDING   Incomplete     Studies: No results found.  Scheduled Meds: . aspirin EC  81 mg Oral Daily  . ciprofloxacin  500 mg Oral BID  . docusate sodium  100 mg Oral BID  . doxycycline  100 mg Oral Q12H  . escitalopram  10 mg Oral Daily  . feeding supplement  237 mL Oral TID BM  . heparin subcutaneous  5,000 Units Subcutaneous Q8H  . insulin aspart  0-15 Units Subcutaneous Q4H  . insulin detemir  30 Units Subcutaneous Daily  . lisinopril  10 mg Oral Daily  . pantoprazole  40 mg Oral Q1200  . simvastatin  10 mg Oral q1800  . tamsulosin  0.4 mg Oral QPC supper  . triamcinolone cream   Topical TID   Continuous Infusions: . 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 Thalamic infarction    Time spent:    CHIU, STEPHEN K  [facility] Pager [phone]. If 7PM-7AM, please contact night-coverage at [email or web address], password [facility] 08/27/2012, 7:47 AM  LOS: 9 days

## 2012-08-27 NOTE — Progress Notes (Signed)
Speech Language Pathology Treatment Patient Details Name: Thomas Frye MRN: [id number] DOB: Jun 23, 1955 Today's Date: 08/27/2012 Time: 4098-1191 SLP Time Calculation (min): 40 min  Assessment / Plan / Recommendation Clinical Impression  Pt seen for skilled dysphagia and cognitive-linguistic treatment.  Observed pt with lunch - pot roast, vegetables, bread, tea.  Mildly slow mastication noted with pot roast primarily demonstrating one throat clear appearing effective for airway protection.  Intake was poor at lunch but po was documented as 100% previously.    Language facilitation completed during mealtime session, pt required moderate verbal/visual cues to follow one-step commands, perseveration x2 during treatment.  Use of written single words helpful to facilitate language.  Speech at conversation level did not have content words when pt was discussing his career- ? language of confusion.   Pt able to state objects on tray, helpful to facilitate conversation.   Paraphasias x2 noted (bitter for big) etc.  Pt independently stated that he has a "fatalistic view of life".  Suspect component of cognitive and language difficulties.  Rec continued slp treatement fo maximize pt's cogling skills.      SLP Plan  Continue with current plan of care    Pertinent Vitals/Pain Afebrile, decreased  SLP Goals  SLP Goals SLP Goal #4 - Progress: Progressing toward goal SLP Goal #5: Pt will follow 1-step commands with 80% accuracy. SLP Goal #5 - Progress: Revised (modified due to lack of progress/goal met) (difficulty with one step commands today 6/2)  General Respiratory Status: Room air Behavior/Cognition: Alert;Cooperative;Pleasant mood;Requires cueing;Distractible Oral Cavity - Dentition: Adequate natural dentition Patient Positioning: Upright in bed  Oral Cavity - Oral Hygiene Does patient have any of the following "at risk" factors?: None of the above   Treatment Treatment focused on:  Cognition;Aphasia;Other (comment) (dysphagia) Skilled Treatment: language facilitation, phonemic/semantic cues, written cues   GO     Thomas Burnet, MS [facility] SLP [phone]

## 2012-08-27 NOTE — Progress Notes (Addendum)
Met with patient at bedside to evaluate for and discuss CIR. Pt alert and cooperative, cognition/memory/speech  impaired. Pt agreed for me to call his wife for information and to discuss CIR. Called his wife, Neysa Bonito and discussed plans for possible CIR. Discussed d/c plans for pt. Wife works full time and would not be able to provide 24/7 supervision but she states that she will do whatever she needs to do to care for her husband. I have begun process for getting insurance authorization. Will keep team informed. For questions, call [phone].

## 2012-08-27 NOTE — Progress Notes (Signed)
Speech Language Pathology Dysphagia Treatment Patient Details Name: Thomas Frye MRN: [id number] DOB: [birth year] Today's Date: 08/27/2012 Time: 4098-1191 SLP Time Calculation (min): 40 min  Assessment / Plan / Recommendation Clinical Impression  Pt seen for skilled dysphagia and cognitive-linguistic treatment.  Observed pt with lunch - pot roast, vegetables, bread, tea.  Mildly slow mastication noted with pot roast primarily demonstrating one throat clear appearing effective for airway protection.  Intake was poor at lunch but po was documented as 100% previously.    Language facilitation completed during mealtime session, pt required moderate verbal/visual cues to follow one-step commands, perseveration x2 during treatment.  Use of written single words helpful to facilitate language.  Speech at conversation level did not have content words when pt was discussing his career- ? language of confusion.   Pt able to state objects on tray, helpful to facilitate conversation.   Paraphasias x2 noted (bitter for big) etc.  Pt independently stated that he has a "fatalistic view of life".  Suspect component of cognitive and language difficulties.  Rec continued slp treatement fo maximize pt's cogling skills.      Diet Recommendation    As tolerated with assistance   SLP Plan Continue with current plan of care   Pertinent Vitals/Pain Afebrile, decreased   Swallowing Goals   least restrictive diet  General Respiratory Status: Room air Behavior/Cognition: Alert;Cooperative;Pleasant mood;Requires cueing;Distractible Oral Cavity - Dentition: Adequate natural dentition Patient Positioning: Upright in bed  Oral Cavity - Oral Hygiene Does patient have any of the following "at risk" factors?: None of the above   Dysphagia Treatment Treatment focused on: Skilled observation of diet tolerance;Patient/family/caregiver education Family/Caregiver Educated: pt Treatment Methods/Modalities: Skilled  observation Patient observed directly with PO's: Yes Type of PO's observed: Regular;Thin liquids Feeding: Able to feed self Liquids provided via: Straw Pharyngeal Phase Signs & Symptoms: Suspected delayed swallow initiation Type of cueing: Verbal Amount of cueing: Minimal   GO     [name], MS [facility] SLP [phone]

## 2012-08-28 LAB — GLUCOSE, CAPILLARY
Glucose-Capillary: 204 mg/dL — ABNORMAL HIGH (ref 70–99)
Glucose-Capillary: 228 mg/dL — ABNORMAL HIGH (ref 70–99)

## 2012-08-28 MED ORDER — INSULIN DETEMIR 100 UNIT/ML ~~LOC~~ SOLN
45.0000 [IU] | Freq: Every day | SUBCUTANEOUS | Status: DC
  Administered 2012-08-28 – 2012-08-29 (×2): 45 [IU] via SUBCUTANEOUS
  Filled 2012-08-28 (×2): qty 0.45

## 2012-08-28 NOTE — Progress Notes (Signed)
Called [employer] to check on insurance approval. Was informed that pt's case will not be looked at until tomorrow. Will follow-up with [employer] in AM. For questions, call [phone]

## 2012-08-28 NOTE — Progress Notes (Signed)
Subjective: No changes overnight.   Exam: Filed Vitals:   08/28/12 1012  BP:   Pulse: 103  Temp:   Resp:    Gen: In bed, NAD MS: Awake, alert ZO:XWRUE, EOMI Motor: 5/5 thoruhgout Sensory:intact to LT  Impression: 57 yo M With hypoglycemic encephalopathy. No focal findings to suggest that the thalamic infarcts seen are new, I suspect related to small vessel disease. 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 He is slowly improving and I suspect he will have continued slow improvement. I feel that he would benefit from CIR.   From a secondary stroke prevention perspective, he is started on ASA and statin. Dopplers with no sig stenosis and echo with no source.   Recommendations: 1) Agree with simvastatin 10mg  for secondary stroke prevention for LDL goal < 70. 2) Continue ASA 3) Rehab would likely benefit him 4) Neurology will sign off at this time, please call with any further questions.   [name], MD [facility] [phone]  If 7pm- 7am, please page neurology on call at [phone].

## 2012-08-28 NOTE — Progress Notes (Signed)
[facility] PROGRESS NOTE  SY [name]:[id number] DOB: 1955/11/20 DOA: 08/18/2012 PCP: CRAFT,PAUL E., PA-C  Assessment/Plan: Severe diabetic hypoglycemia  -resolved  -BS in the 190-200. Will increase levemir to 45units, cont SSI  -unsure if this was intentional as suicide attempt or accidental insulin OD  -psych following- pt was receiving OP tx for depression and was separated from his wife  Acute encephalopathy/ ? Thalamic infarction  -initially due to profound hypoglycemia and hypoventilation/hypoxia  -Neuro following  -EEG unremarkable  -PT and OT evaluations- CIR recommended and consulted  -Carotid dopplers with no significant stenosis -Echo with EF of  60-65% Dysphagia  -SLP following  -Advance as tolerated  Acute respiratory failure in setting of profound hypoglycemia  -resolved  DM (diabetes mellitus), type 2 with peripheral vascular complications  -now uncontrolled with CBG of around 200  -will increase Levemir to 45units and cont SSI  -A1c of 7.4  HTN (hypertension)  -Renal fx unremarkable  -BP suboptimal -Lisinopril added back per home regimen  Noncompliance  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 Foley replaced.  -On flomax  -May consider Urology f/u for bladder training   Code Status: Full Family Communication: Pt in room, discussed case w/ wife over phone (indicate person spoken with, relationship, and if by phone, the number) Disposition Plan: Pending   Consultants:  neurology  Procedures: 5-24 Lt i j cvl>>  5-24 OTT(EDP)>>08/21/12  08/24/12 EEG, unremarkable  Antibiotics: 5-24 ancef>>5/25  Zosyn 5/25>>5/27  cipr / doxy (chronic from  home)>>>  HPI/Subjective: No major events overnight.  Objective: Filed Vitals:   08/27/12 0600 08/27/12 1415 08/27/12 2200 08/28/12 0600  BP: 143/74 124/61 157/72 152/72  Pulse: 85 83 85 83  Temp: 97.6 F (36.4 C) 98.8 F (37.1 C) 98 F (36.7 C) 97.6 F (36.4 C)  TempSrc: Oral Oral Oral Oral  Resp: 20 20 20 20   Height:      Weight:      SpO2: 98% 98% 99% 96%    Intake/Output Summary (Last 24 hours) at 08/28/12 0759 Last data filed at 08/27/12 1821  Gross per 24 hour  Intake    480 ml  Output   1575 ml  Net  -1095 ml   Filed Weights   08/20/12 0500 08/21/12 0500 08/22/12 0500  Weight: 131.2 kg (289 lb 3.9 oz) 124 kg (273 lb 5.9 oz) 126.4 kg (278 lb 10.6 oz)    Exam:   General:  Awake in NAD  Cardiovascular: Regular, s1, s2  Respiratory: Normal respiratory effort  Abdomen: Soft, nondistended  Musculoskeletal: Perfused distally, no clubbing   Data Reviewed: Basic Metabolic Panel:  Recent Labs Lab 08/22/12 0500 08/23/12 0340 08/24/12 0200 08/25/12 0500  NA 139 140 134* 137  K 3.9 3.9 3.8 3.5  CL 100 102 97 100  CO2 24 25 24 23   GLUCOSE 225* 232* 309* 261*  BUN 15 17 16  24*  CREATININE 0.79 0.83 0.82 1.09  CALCIUM 9.2 9.5 9.4 9.5   Liver Function Tests:  Recent Labs Lab 08/22/12 0500 08/25/12 0500  AST 26 16  ALT 24 17  ALKPHOS 68 109  BILITOT 0.6 0.4  PROT 6.4 6.5  ALBUMIN 2.7* 2.6*   No results  found for this basename: LIPASE, AMYLASE,  in the last 168 hours No results found for this basename: AMMONIA,  in the last 168 hours CBC:  Recent Labs Lab 08/22/12 0500 08/24/12 0200 08/25/12 0500  WBC 10.7* 9.4 7.8  NEUTROABS 8.0* 6.8 5.4  HGB 10.9* 10.9* 11.1*  HCT 32.5* 31.3* 32.1*  MCV 95.0 93.2 93.0  PLT 231 235 242   Cardiac Enzymes: No results found for this basename: CKTOTAL, CKMB, CKMBINDEX, TROPONINI,  in the last 168 hours BNP (last 3 results) No results found for this basename: PROBNP,  in the last 8760  hours CBG:  Recent Labs Lab 08/27/12 1155 08/27/12 1643 08/27/12 1933 08/27/12 2328 08/28/12 0343  GLUCAP 260* 216* 175* 206* 204*    Recent Results (from the past 240 hour(s))  URINE CULTURE     Status: None   Collection Time    08/18/12 11:12 AM      Result Value Range Status   Specimen Description URINE, CATHETERIZED   Final   Special Requests Normal   Final   Culture  Setup Time 08/19/2012 12:54   Final   Colony Count NO GROWTH   Final   Culture NO GROWTH   Final   Report Status 08/20/2012 FINAL   Final  CULTURE, BLOOD (ROUTINE X 2)     Status: None   Collection Time    08/18/12  1:51 PM      Result Value Range Status   Specimen Description BLOOD RIGHT ARM   Final   Special Requests BOTTLES DRAWN AEROBIC ONLY 10CC   Final   Culture  Setup Time 08/18/2012 20:38   Final   Culture NO GROWTH 5 DAYS   Final   Report Status 08/24/2012 FINAL   Final  CULTURE, BLOOD (ROUTINE X 2)     Status: None   Collection Time    08/18/12  2:00 PM      Result Value Range Status   Specimen Description BLOOD RIGHT ARM   Final   Special Requests BOTTLES DRAWN AEROBIC ONLY 5CC   Final   Culture  Setup Time 08/18/2012 20:37   Final   Culture NO GROWTH 5 DAYS   Final   Report Status 08/24/2012 FINAL   Final  MRSA PCR SCREENING     Status: None   Collection Time    08/18/12  2:08 PM      Result Value Range Status   MRSA by PCR NEGATIVE  NEGATIVE Final   Comment:            The GeneXpert MRSA Assay (FDA     approved for NASAL specimens     only), is one component of a     comprehensive MRSA colonization     surveillance program. It is not     intended to diagnose MRSA     infection nor to guide or     monitor treatment for     MRSA infections.  CULTURE, RESPIRATORY (NON-EXPECTORATED)     Status: None   Collection Time    08/18/12  7:54 PM      Result Value Range Status   Specimen Description TRACHEAL ASPIRATE   Final   Special Requests NONE   Final   Gram Stain     Final    Value: MODERATE WBC PRESENT, PREDOMINANTLY PMN     NO SQUAMOUS EPITHELIAL CELLS SEEN     RARE GRAM POSITIVE COCCI     IN PAIRS   Culture Non-Pathogenic  Oropharyngeal-type Flora Isolated.   Final   Report Status 08/21/2012 FINAL   Final  CULTURE, BLOOD (ROUTINE X 2)     Status: None   Collection Time    08/24/12  2:45 AM      Result Value Range Status   Specimen Description BLOOD RIGHT ARM   Final   Special Requests     Final   Value: BOTTLES DRAWN AEROBIC AND ANAEROBIC 10CC,PT ON CIPROFLOXACIN,DOXYCYCLINE   Culture  Setup Time 08/24/2012 10:32   Final   Culture     Final   Value:        BLOOD CULTURE RECEIVED NO GROWTH TO DATE CULTURE WILL BE HELD FOR 5 DAYS BEFORE ISSUING A FINAL NEGATIVE REPORT   Report Status PENDING   Incomplete  CULTURE, BLOOD (ROUTINE X 2)     Status: None   Collection Time    08/24/12  2:50 AM      Result Value Range Status   Specimen Description BLOOD LEFT HAND   Final   Special Requests     Final   Value: BOTTLES DRAWN AEROBIC ONLY 10CC,PT ON CIPROFLOXACIN,DOXYCYCLINE   Culture  Setup Time 08/24/2012 10:30   Final   Culture     Final   Value:        BLOOD CULTURE RECEIVED NO GROWTH TO DATE CULTURE WILL BE HELD FOR 5 DAYS BEFORE ISSUING A FINAL NEGATIVE REPORT   Report Status PENDING   Incomplete     Studies: No results found.  Scheduled Meds: . aspirin EC  81 mg Oral Daily  . ciprofloxacin  500 mg Oral BID  . docusate sodium  100 mg Oral BID  . doxycycline  100 mg Oral Q12H  . escitalopram  10 mg Oral Daily  . feeding supplement  237 mL Oral TID BM  . heparin subcutaneous  5,000 Units Subcutaneous Q8H  . insulin aspart  0-15 Units Subcutaneous Q4H  . insulin detemir  40 Units Subcutaneous Daily  . lisinopril  10 mg Oral Daily  . pantoprazole  40 mg Oral Q1200  . simvastatin  10 mg Oral q1800  . tamsulosin  0.4 mg Oral QPC supper  . triamcinolone cream   Topical TID   Continuous Infusions: . 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 Thalamic infarction    Time spent:    Thomas Frye K  [facility] Pager [phone]. If 7PM-7AM, please contact night-coverage at [email or web address], password [facility] 08/28/2012, 7:59 AM  LOS: 10 days

## 2012-08-28 NOTE — Progress Notes (Signed)
Clinical Social Work Department CLINICAL SOCIAL WORK PLACEMENT NOTE 08/28/2012  Patient:  Thomas Frye, Thomas Frye  Account Number:  [id number] Admit date:  08/18/2012  Clinical Social Worker:  Robin Searing  Date/time:  08/28/2012 12:01 PM  Clinical Social Work is seeking post-discharge placement for this patient at the following level of care:   SKILLED NURSING   (*CSW will update this form in Epic as items are completed)   08/28/2012  Patient/family provided with [facility] Department of Clinical Social Work's list of facilities offering this level of care within the geographic area requested by the patient (or if unable, by the patient's family).  08/28/2012  Patient/family informed of their freedom to choose among providers that offer the needed level of care, that participate in Medicare, Medicaid or managed care program needed by the patient, have an available bed and are willing to accept the patient.  08/28/2012  Patient/family informed of [facility]' ownership interest in [facility], as well as of the fact that they are under no obligation to receive care at this facility.  PASARR submitted to EDS on 08/28/2012 PASARR number received from EDS on   FL2 transmitted to all facilities in geographic area requested by pt/family on  08/28/2012 FL2 transmitted to all facilities within larger geographic area on   Patient informed that his/her managed care company has contracts with or will negotiate with  certain facilities, including the following:     Patient/family informed of bed offers received:   Patient chooses bed at  Physician recommends and patient chooses bed at    Patient to be transferred to  on   Patient to be transferred to facility by   The following physician request were entered in Epic:   Additional Comments: Reece Levy, MSW, [facility] [phone]

## 2012-08-28 NOTE — Progress Notes (Signed)
Clinical Social Work Department BRIEF PSYCHOSOCIAL ASSESSMENT 08/28/2012  Patient:  Thomas, Frye     Account Number:  [id number]     Admit date:  08/18/2012  Clinical Social Worker:  Robin Searing  Date/Time:  08/28/2012 11:48 AM  Referred by:  Physician  Date Referred:  08/27/2012 Referred for  SNF Placement   Other Referral:   Interview type:  Patient Other interview type:   WIFE AT BEDSIDE AS WELL    PSYCHOSOCIAL DATA Living Status:  FAMILY Admitted from facility:   Level of care:   Primary support name:  Wife- Thomas Frye Primary support relationship to patient:  FAMILY Degree of support available:   parents and wife    CURRENT CONCERNS Current Concerns  Post-Acute Placement   Other Concerns:    SOCIAL WORK ASSESSMENT / PLAN Patient admitted from home where he was currently living with his parents while he and his wife 'worked on their marriage'.    Discussed SNF with them in case CIR is not approved- they are agreeable to this for SNF consideration-  this also will require [employer] SNF auth.   Assessment/plan status:  Other - See comment Other assessment/ plan:   FL2 and Pasarr to be completed for SNF search   Information/referral to community resources:   SNF  CIR    PATIENT'S/FAMILY'S RESPONSE TO PLAN OF CARE: Will proceed with SNF search and advise- also spoke with Thomas Frye who is pursuing CIR with [employer].   Thomas Frye, MSW, [facility] [phone]

## 2012-08-28 NOTE — Progress Notes (Signed)
We await notification from [facility] re: approval for CIR. Met with patient's wife to discuss plans for pt's rehabilitation. Advised her that [employer] may approve only CIR or SNF, not both. Discussed future plans for pt. Pt's wife is very dedicated to advocate for and provide the best care for her husband. Emotional support provided. Will f/u with team and pt's wife as soon as I hear from [facility]. Call [phone] for questions.

## 2012-08-28 NOTE — Progress Notes (Addendum)
Physical Therapy Treatment Patient Details Name: Thomas Frye MRN: [id number] DOB: April 22, 1955 Today's Date: 08/28/2012 Time: 3664-4034 PT Time Calculation (min): 16 min  PT Assessment / Plan / Recommendation Comments on Treatment Session  57 y.o. male admitted to [facility] with hypoglycemic event, VDRF, acute encephalopathy vs thalamic infarct, HTN, dysphasia, chronic right foot ulcer.  He is progressing quickly with gait today only needing one person assist.  He continues to have significant balance deficits especially while turning during gait.  His safety and cognition also make him at higher risk for falls as he has difficulty identifying and correcting errors without external manual or verbal cues.  He continues to be an excellent rehab candidate.      Follow Up Recommendations  CIR     Does the patient have the potential to tolerate intense rehabilitation    yes  Barriers to Discharge   none      Equipment Recommendations  Rolling walker with 5" wheels;Wheelchair (measurements PT);Wheelchair cushion (measurements PT)    Recommendations for Other Services   none  Frequency Min 4X/week   Plan Discharge plan remains appropriate;Frequency needs to be updated    Precautions / Restrictions Precautions Precautions: Fall Required Braces or Orthoses: Other Brace/Splint Other Brace/Splint: CAM boot right ankle.   Restrictions Weight Bearing Restrictions: No   Pertinent Vitals/Pain See vitals flow sheet.     Mobility  Bed Mobility Bed Mobility: Supine to Sit Supine to Sit: 5: Supervision;HOB elevated;With rails Sitting - Scoot to Edge of Bed: 5: Supervision Transfers Sit to Stand: 4: Min assist;With upper extremity assist;With armrests;From chair/3-in-1 Stand to Sit: With upper extremity assist;With armrests;To chair/3-in-1;4: Min assist Details for Transfer Assistance: Min assist to stabilize pt as he gets out of low chair, Uncontrolled descent to sit with verbal cues for safe  hand placement for both sit and stand.   Ambulation/Gait Ambulation/Gait Assistance: 3: Mod assist Ambulation Distance (Feet): 35 Feet Assistive device: Rolling walker Ambulation/Gait Assistance Details: up to mod assist for balance during gait with RW.  Mod assist needed x 2 while turning to prevent fall.  Verbal cues for safety with RW use and leg sequencing while turning.   Gait Pattern: Step-to pattern;Antalgic;Decreased dorsiflexion - left;Left steppage Gait velocity: less than 1.8 ft/sec indicating risk for recurrent falls.      PT Goals Acute Rehab PT Goals Pt will go Sit to Stand: with supervision;with upper extremity assist PT Goal: Sit to Stand - Progress: Updated due to goal met PT Goal: Ambulate - Progress: Progressing toward goal  Visit Information  Last PT Received On: 08/28/12 Assistance Needed: +1    Subjective Data  Subjective: Pt looks more alert, chatting with visitor from his church.    Cognition  Cognition Arousal/Alertness: Awake/alert Behavior During Therapy: WFL for tasks assessed/performed Overall Cognitive Status: Impaired/Different from baseline Area of Impairment: Orientation;Attention;Memory;Awareness Orientation Level: Disoriented to;Place;Time Current Attention Level: Sustained Following Commands: Follows multi-step commands inconsistently Safety/Judgement: Decreased awareness of safety Problem Solving: Slow processing General Comments: Needs cues for safety during gait. Moves before he is balanced.  Following more complex commands today    Balance  Balance Balance Assessed: Yes Static Standing Balance Static Standing - Balance Support: Bilateral upper extremity supported Static Standing - Level of Assistance: 4: Min assist Dynamic Standing Balance Dynamic Standing - Balance Support: Bilateral upper extremity supported Dynamic Standing - Level of Assistance: 3: Mod assist  End of Session PT - End of Session Equipment Utilized During  Treatment: Gait belt Activity Tolerance:  Patient limited by fatigue Patient left: in chair;with call bell/phone within reach;with chair alarm set       Thomas Frye, PT, DPT [phone]   08/28/2012, 12:09 PM

## 2012-08-28 NOTE — Progress Notes (Signed)
Occupational Therapy Treatment Patient Details Name: Thomas Frye MRN: [id number] DOB: 27-Dec-1955 Today's Date: 08/28/2012 Time: 1478-2956 OT Time Calculation (min): 47 min  OT Assessment / Plan / Recommendation Comments on Treatment Session Pt overall mod assist level for toileting and selfcare tasks.  Demonstrates some motor apraxia especially with FM tasks such as tying his shoe.  Also with decreased awareness and attention compared to normal but improving with continued OT.  Feel he is a perfect inpatient rehab candidate.    Follow Up Recommendations  CIR       Equipment Recommendations  3 in 1 bedside comode;Tub/shower bench    Recommendations for Other Services Rehab consult  Frequency Min 2X/week   Plan Discharge plan remains appropriate    Precautions / Restrictions Precautions Precautions: Fall Other Brace/Splint: CAM boot right ankle.   Restrictions Weight Bearing Restrictions: No   Pertinent Vitals/Pain No report of pain vitals stable    ADL  Grooming: Performed;Wash/dry hands;Wash/dry face;Teeth care;Set up Where Assessed - Grooming: Unsupported sitting Lower Body Dressing: Performed;Maximal assistance Where Assessed - Lower Body Dressing: Unsupported sitting Toilet Transfer: Performed;Moderate assistance Toilet Transfer Method: Other (comment) (ambulate with RW) Toileting - Clothing Manipulation and Hygiene: Moderate assistance Where Assessed - Toileting Clothing Manipulation and Hygiene: Sit to stand from 3-in-1 or toilet Transfers/Ambulation Related to ADLs: Pt able to ambulate to the bathroom with the RW and cam boot with mod assist.  Pt taking short step length initially but improved with further mobility. ADL Comments: Pt's wife present for session and very supportive.  Pt able to follow one step commands related to selfcare/grooming tasks consistently.  Attempted to donn left shoe but therapist had to assist with positioning it over his heel and to tie his  shoe.  When given brush pt was able to initate and perform brushing his hair without any verbal cueing.  When given toothbrush and toothpaste he was able to apply totothpaste and brush his teeth as well without verbal cueing as well.  Pt's wife reporting that pt can self feed using utensils but has difficulty when attempting to bring finger foods up to his mouth or attempting to bite a sandwich.      OT Goals ADL Goals Pt Will Perform Eating: with supervision;Sitting, chair;with set-up Pt Will Perform Grooming: Standing at sink;Supported ADL Goal: Grooming - Progress: Updated due to goal met Pt Will Perform Upper Body Bathing: with min assist;Sitting, chair Pt Will Perform Upper Body Dressing: with min assist;Sitting, chair Pt Will Transfer to Toilet: with min assist;Ambulation;with DME;3-in-1 ADL Goal: Toilet Transfer - Progress: Updated due to goal met Miscellaneous OT Goals OT Goal: Miscellaneous Goal #1 - Progress: Met OT Goal: Miscellaneous Goal #2 - Progress: Progressing toward goals  Visit Information  Last OT Received On: 08/28/12 Assistance Needed: +1    Subjective Data  Subjective: "Christy" when asked what his wife's name is.  Pt with moderate word finding difficulty throughout session. Patient Stated Goal: Wife wants him to get to rehab.      Cognition  Cognition Arousal/Alertness: Awake/alert Behavior During Therapy: WFL for tasks assessed/performed Overall Cognitive Status: Impaired/Different from baseline Area of Impairment: Orientation;Attention;Memory;Awareness Orientation Level: Disoriented to;Place;Time Current Attention Level: Sustained Following Commands: Follows one step commands consistently Safety/Judgement: Decreased awareness of safety Problem Solving: Slow processing    Mobility  Bed Mobility Bed Mobility: Supine to Sit Supine to Sit: 5: Supervision;HOB elevated;With rails Sitting - Scoot to Edge of Bed: 5: Supervision Transfers Transfers: Sit to  Stand Sit to  Stand: 3: Mod assist;With upper extremity assist;From toilet Stand to Sit: 3: Mod assist;To toilet Details for Transfer Assistance: Pt with uncontrolled descend when attempting to sit on the toilet.  Needed max instructional cueing for hand placment with sit to stand.       Balance Balance Balance Assessed: Yes Static Standing Balance Static Standing - Balance Support: Right upper extremity supported;Left upper extremity supported Static Standing - Level of Assistance: 4: Min assist Dynamic Standing Balance Dynamic Standing - Balance Support: Right upper extremity supported;Left upper extremity supported Dynamic Standing - Level of Assistance: 3: Mod assist   End of Session OT - End of Session Equipment Utilized During Treatment: Gait belt Activity Tolerance: Patient tolerated treatment well Patient left: in chair;with chair alarm set;with family/visitor present Nurse Communication: Mobility status     Kade Demicco OTR/L Pager number [phone] 08/28/2012, 10:23 AM

## 2012-08-28 NOTE — Progress Notes (Signed)
Speech Language Pathology Treatment Patient Details Name: Thomas Frye MRN: [id number] DOB: [birth year] Today's Date: 08/28/2012 Time: 1600-     Assessment / Plan / Recommendation Clinical Impression  Pt. seen for skilled ST for cognitive-linguistic deficits, with a moderate expressive aphasia noted.  Pt. reponds to questions slowly, and demonstrates dysnomia, which he attempts to compensate by filling in another word (but the word he substitues is frequently not appropriate to the topic).  Awareness appears to be improving as he is attempting to self-correct/compensate.  Perseveration was noted 5 times during a 40 minute session.  Pt. was able to name objects in room, and generate a sentence with each word named, however, his sentences lacked variety (or he perservated, using the same sentence for each word).  Verbal/cognitive flexibility is certainly reduced.  Receptive language appears to be intact, with appropriate responses to jokes and conversation.    SLP Plan  Continue with current plan of care    Pertinent Vitals/Pain n/a  SLP Goals  SLP Goals Potential to Achieve Goals: Good Progress/Goals/Alternative treatment plan discussed with pt/caregiver and they: Agree SLP Goal #1 - Progress: Met SLP Goal #2 - Progress: Met SLP Goal #3 - Progress: Partly met SLP Goal #5 - Progress: Progressing toward goal  General Temperature Spikes Noted: No Respiratory Status: Room air Behavior/Cognition: Alert;Cooperative;Pleasant mood;Requires cueing Oral Cavity - Dentition: Adequate natural dentition Patient Positioning: Upright in chair  Oral Cavity - Oral Hygiene Does patient have any of the following "at risk" factors?: None of the above Brush patient's teeth BID with toothbrush (using toothpaste with fluoride): Yes Patient is HIGH RISK - Oral Care Protocol followed (see row info): Yes Patient is AT RISK - Oral Care Protocol followed (see row info): Yes   Treatment Treatment focused  on: Aphasia;Cognition Skilled Treatment: language facilitation, phonemic/semantic cues, written cues   GO     Maryjo Rochester T 08/28/2012, 4:49 PM

## 2012-08-29 ENCOUNTER — Inpatient Hospital Stay (HOSPITAL_COMMUNITY)
Admission: RE | Admit: 2012-08-29 | Discharge: 2012-09-13 | DRG: 462 | Disposition: A | Discharge status: Home / Self Care | Payer: BC Managed Care – PPO | Source: Intra-hospital | Attending: Physical Medicine & Rehabilitation | Admitting: Physical Medicine & Rehabilitation

## 2012-08-29 ENCOUNTER — Encounter (HOSPITAL_COMMUNITY): Payer: Self-pay | Admitting: *Deleted

## 2012-08-29 DIAGNOSIS — I959 Hypotension, unspecified: Secondary | ICD-10-CM | POA: Diagnosis present

## 2012-08-29 DIAGNOSIS — E1142 Type 2 diabetes mellitus with diabetic polyneuropathy: Secondary | ICD-10-CM | POA: Diagnosis present

## 2012-08-29 DIAGNOSIS — E1151 Type 2 diabetes mellitus with diabetic peripheral angiopathy without gangrene: Secondary | ICD-10-CM | POA: Diagnosis present

## 2012-08-29 DIAGNOSIS — R339 Retention of urine, unspecified: Secondary | ICD-10-CM | POA: Diagnosis present

## 2012-08-29 DIAGNOSIS — IMO0001 Reserved for inherently not codable concepts without codable children: Secondary | ICD-10-CM

## 2012-08-29 DIAGNOSIS — L97509 Non-pressure chronic ulcer of other part of unspecified foot with unspecified severity: Secondary | ICD-10-CM | POA: Diagnosis present

## 2012-08-29 DIAGNOSIS — G931 Anoxic brain damage, not elsewhere classified: Secondary | ICD-10-CM

## 2012-08-29 DIAGNOSIS — L97911 Non-pressure chronic ulcer of unspecified part of right lower leg limited to breakdown of skin: Secondary | ICD-10-CM | POA: Diagnosis present

## 2012-08-29 DIAGNOSIS — F3289 Other specified depressive episodes: Secondary | ICD-10-CM | POA: Diagnosis present

## 2012-08-29 DIAGNOSIS — E1149 Type 2 diabetes mellitus with other diabetic neurological complication: Secondary | ICD-10-CM | POA: Diagnosis present

## 2012-08-29 DIAGNOSIS — R159 Full incontinence of feces: Secondary | ICD-10-CM | POA: Diagnosis present

## 2012-08-29 DIAGNOSIS — G4733 Obstructive sleep apnea (adult) (pediatric): Secondary | ICD-10-CM | POA: Diagnosis present

## 2012-08-29 DIAGNOSIS — E1165 Type 2 diabetes mellitus with hyperglycemia: Secondary | ICD-10-CM

## 2012-08-29 DIAGNOSIS — Z5189 Encounter for other specified aftercare: Principal | ICD-10-CM

## 2012-08-29 DIAGNOSIS — E1169 Type 2 diabetes mellitus with other specified complication: Secondary | ICD-10-CM | POA: Diagnosis present

## 2012-08-29 DIAGNOSIS — I10 Essential (primary) hypertension: Secondary | ICD-10-CM | POA: Diagnosis present

## 2012-08-29 DIAGNOSIS — F329 Major depressive disorder, single episode, unspecified: Secondary | ICD-10-CM | POA: Diagnosis present

## 2012-08-29 DIAGNOSIS — K219 Gastro-esophageal reflux disease without esophagitis: Secondary | ICD-10-CM | POA: Diagnosis present

## 2012-08-29 LAB — COMPREHENSIVE METABOLIC PANEL
ALT: 13 U/L (ref 0–53)
AST: 14 U/L (ref 0–37)
Alkaline Phosphatase: 88 U/L (ref 39–117)
CO2: 27 mEq/L (ref 19–32)
Chloride: 101 mEq/L (ref 96–112)
Creatinine, Ser: 0.71 mg/dL (ref 0.50–1.35)
GFR calc non Af Amer: >90 mL/min (ref >90)
Potassium: 3.7 mEq/L (ref 3.5–5.1)
Total Bilirubin: 0.4 mg/dL (ref 0.3–1.2)

## 2012-08-29 LAB — CBC WITH DIFFERENTIAL/PLATELET
Basophils Absolute: 0.1 10*3/uL (ref 0.0–0.1)
HCT: 31.8 % — ABNORMAL LOW (ref 39.0–52.0)
Hemoglobin: 11 g/dL — ABNORMAL LOW (ref 13.0–17.0)
Lymphocytes Relative: 9 % — ABNORMAL LOW (ref 12–46)
Monocytes Absolute: 1 10*3/uL (ref 0.1–1.0)
Neutro Abs: 10.2 10*3/uL — ABNORMAL HIGH (ref 1.7–7.7)
Neutrophils Relative %: 81 % — ABNORMAL HIGH (ref 43–77)
RDW: 13 % (ref 11.5–15.5)
WBC: 12.6 10*3/uL — ABNORMAL HIGH (ref 4.0–10.5)

## 2012-08-29 LAB — URINALYSIS, ROUTINE W REFLEX MICROSCOPIC
Bilirubin Urine: NEGATIVE
Glucose, UA: >1000 mg/dL — AB
Specific Gravity, Urine: 1.034 — ABNORMAL HIGH (ref 1.005–1.030)

## 2012-08-29 LAB — URINE MICROSCOPIC-ADD ON

## 2012-08-29 LAB — GLUCOSE, CAPILLARY
Glucose-Capillary: 147 mg/dL — ABNORMAL HIGH (ref 70–99)
Glucose-Capillary: 172 mg/dL — ABNORMAL HIGH (ref 70–99)
Glucose-Capillary: 172 mg/dL — ABNORMAL HIGH (ref 70–99)
Glucose-Capillary: 238 mg/dL — ABNORMAL HIGH (ref 70–99)
Glucose-Capillary: 264 mg/dL — ABNORMAL HIGH (ref 70–99)

## 2012-08-29 MED ORDER — DOCUSATE SODIUM 100 MG PO CAPS
100.0000 mg | ORAL_CAPSULE | Freq: Two times a day (BID) | ORAL | Status: DC
  Administered 2012-08-29 – 2012-09-13 (×25): 100 mg via ORAL
  Filled 2012-08-29 (×33): qty 1

## 2012-08-29 MED ORDER — PANTOPRAZOLE SODIUM 40 MG PO TBEC
40.0000 mg | DELAYED_RELEASE_TABLET | Freq: Every day | ORAL | Status: DC
  Administered 2012-08-30 – 2012-09-12 (×14): 40 mg via ORAL
  Filled 2012-08-29 (×14): qty 1

## 2012-08-29 MED ORDER — ESCITALOPRAM OXALATE 10 MG PO TABS
10.0000 mg | ORAL_TABLET | Freq: Every day | ORAL | Status: DC
  Administered 2012-08-30 – 2012-09-13 (×15): 10 mg via ORAL
  Filled 2012-08-29 (×17): qty 1

## 2012-08-29 MED ORDER — SACCHAROMYCES BOULARDII 250 MG PO CAPS
250.0000 mg | ORAL_CAPSULE | Freq: Two times a day (BID) | ORAL | Status: DC
  Administered 2012-08-29 – 2012-09-13 (×30): 250 mg via ORAL
  Filled 2012-08-29 (×32): qty 1

## 2012-08-29 MED ORDER — INSULIN DETEMIR 100 UNIT/ML ~~LOC~~ SOLN: 45.0000 [IU] | Freq: Every day | SUBCUTANEOUS | Status: DC

## 2012-08-29 MED ORDER — INSULIN ASPART 100 UNIT/ML ~~LOC~~ SOLN: 0.0000 [IU] | SUBCUTANEOUS | Status: DC

## 2012-08-29 MED ORDER — BISACODYL 10 MG RE SUPP: 10.0000 mg | Freq: Every day | RECTAL | Status: DC | PRN

## 2012-08-29 MED ORDER — GUAIFENESIN-DM 100-10 MG/5ML PO SYRP: 5.0000 mL | ORAL_SOLUTION | Freq: Four times a day (QID) | ORAL | Status: DC | PRN

## 2012-08-29 MED ORDER — ENOXAPARIN SODIUM 40 MG/0.4ML ~~LOC~~ SOLN
40.0000 mg | SUBCUTANEOUS | Status: DC
  Administered 2012-08-29 – 2012-09-12 (×15): 40 mg via SUBCUTANEOUS
  Filled 2012-08-29 (×16): qty 0.4

## 2012-08-29 MED ORDER — INSULIN DETEMIR 100 UNIT/ML ~~LOC~~ SOLN
45.0000 [IU] | Freq: Every day | SUBCUTANEOUS | Status: DC
  Administered 2012-08-30 – 2012-09-09 (×11): 45 [IU] via SUBCUTANEOUS
  Filled 2012-08-29 (×12): qty 0.45

## 2012-08-29 MED ORDER — CIPROFLOXACIN HCL 500 MG PO TABS
500.0000 mg | ORAL_TABLET | Freq: Two times a day (BID) | ORAL | Status: AC
  Administered 2012-08-29 – 2012-09-01 (×6): 500 mg via ORAL
  Filled 2012-08-29 (×7): qty 1

## 2012-08-29 MED ORDER — ALUM & MAG HYDROXIDE-SIMETH 200-200-20 MG/5ML PO SUSP: 30.0000 mL | ORAL | Status: DC | PRN

## 2012-08-29 MED ORDER — DOXYCYCLINE HYCLATE 100 MG PO TABS
100.0000 mg | ORAL_TABLET | Freq: Two times a day (BID) | ORAL | Status: DC
  Administered 2012-08-29 – 2012-09-13 (×30): 100 mg via ORAL
  Filled 2012-08-29 (×32): qty 1

## 2012-08-29 MED ORDER — INSULIN ASPART 100 UNIT/ML ~~LOC~~ SOLN
0.0000 [IU] | SUBCUTANEOUS | Status: DC
  Administered 2012-08-29 – 2012-08-30 (×3): 5 [IU] via SUBCUTANEOUS
  Administered 2012-08-30: 11 [IU] via SUBCUTANEOUS
  Administered 2012-08-30 (×2): 3 [IU] via SUBCUTANEOUS
  Administered 2012-08-30: 5 [IU] via SUBCUTANEOUS
  Administered 2012-08-31: 11 [IU] via SUBCUTANEOUS
  Administered 2012-08-31 (×2): 3 [IU] via SUBCUTANEOUS
  Administered 2012-08-31: 5 [IU] via SUBCUTANEOUS

## 2012-08-29 MED ORDER — LORATADINE 10 MG PO TABS
10.0000 mg | ORAL_TABLET | Freq: Every day | ORAL | Status: DC
  Administered 2012-08-29: 10 mg via ORAL
  Filled 2012-08-29: qty 1

## 2012-08-29 MED ORDER — LISINOPRIL 10 MG PO TABS
10.0000 mg | ORAL_TABLET | Freq: Every day | ORAL | Status: DC
  Administered 2012-08-30 – 2012-09-02 (×4): 10 mg via ORAL
  Filled 2012-08-29 (×6): qty 1

## 2012-08-29 MED ORDER — FLEET ENEMA 7-19 GM/118ML RE ENEM: 1.0000 | ENEMA | Freq: Once | RECTAL | Status: AC | PRN

## 2012-08-29 MED ORDER — TRIAMCINOLONE ACETONIDE 0.5 % EX CREA
TOPICAL_CREAM | Freq: Three times a day (TID) | CUTANEOUS | Status: DC
  Administered 2012-08-29 – 2012-09-05 (×15): via TOPICAL
  Filled 2012-08-29: qty 15

## 2012-08-29 MED ORDER — GLUCERNA SHAKE PO LIQD
237.0000 mL | Freq: Three times a day (TID) | ORAL | Status: DC
  Administered 2012-08-30 – 2012-09-03 (×9): 237 mL via ORAL

## 2012-08-29 MED ORDER — PROCHLORPERAZINE MALEATE 5 MG PO TABS
5.0000 mg | ORAL_TABLET | Freq: Four times a day (QID) | ORAL | Status: DC | PRN
  Filled 2012-08-29: qty 2

## 2012-08-29 MED ORDER — SIMVASTATIN 10 MG PO TABS
10.0000 mg | ORAL_TABLET | Freq: Every day | ORAL | Status: DC
  Administered 2012-08-30 – 2012-09-12 (×14): 10 mg via ORAL
  Filled 2012-08-29 (×16): qty 1

## 2012-08-29 MED ORDER — ACETAMINOPHEN 325 MG PO TABS: 325.0000 mg | ORAL_TABLET | ORAL | Status: DC | PRN

## 2012-08-29 MED ORDER — PROCHLORPERAZINE EDISYLATE 5 MG/ML IJ SOLN
5.0000 mg | Freq: Four times a day (QID) | INTRAMUSCULAR | Status: DC | PRN
  Filled 2012-08-29: qty 2

## 2012-08-29 MED ORDER — CIPROFLOXACIN HCL 500 MG PO TABS: 500.0000 mg | ORAL_TABLET | Freq: Two times a day (BID) | ORAL | Status: DC

## 2012-08-29 MED ORDER — TAMSULOSIN HCL 0.4 MG PO CAPS: 0.4000 mg | ORAL_CAPSULE | Freq: Every day | ORAL | Status: DC

## 2012-08-29 MED ORDER — ASPIRIN EC 81 MG PO TBEC
81.0000 mg | DELAYED_RELEASE_TABLET | Freq: Every day | ORAL | Status: DC
  Administered 2012-08-30 – 2012-09-13 (×15): 81 mg via ORAL
  Filled 2012-08-29 (×16): qty 1

## 2012-08-29 MED ORDER — ASPIRIN 81 MG PO TBEC: 81.0000 mg | DELAYED_RELEASE_TABLET | Freq: Every day | ORAL | Status: DC

## 2012-08-29 MED ORDER — PROCHLORPERAZINE 25 MG RE SUPP
12.5000 mg | Freq: Four times a day (QID) | RECTAL | Status: DC | PRN
  Filled 2012-08-29: qty 1

## 2012-08-29 MED ORDER — POLYETHYLENE GLYCOL 3350 17 G PO PACK
17.0000 g | PACK | Freq: Every day | ORAL | Status: DC | PRN
  Administered 2012-08-29: 17 g via ORAL
  Filled 2012-08-29: qty 1

## 2012-08-29 MED ORDER — TRAZODONE HCL 50 MG PO TABS: 25.0000 mg | ORAL_TABLET | Freq: Every evening | ORAL | Status: DC | PRN

## 2012-08-29 MED ORDER — ENSURE COMPLETE PO LIQD: 237.0000 mL | Freq: Two times a day (BID) | ORAL | Status: DC

## 2012-08-29 MED ORDER — ENSURE COMPLETE PO LIQD
237.0000 mL | Freq: Two times a day (BID) | ORAL | Status: DC
  Administered 2012-08-29 (×2): 237 mL via ORAL

## 2012-08-29 MED ORDER — LORATADINE 10 MG PO TABS
10.0000 mg | ORAL_TABLET | Freq: Every day | ORAL | Status: DC
  Administered 2012-08-30 – 2012-09-13 (×15): 10 mg via ORAL
  Filled 2012-08-29 (×18): qty 1

## 2012-08-29 MED ORDER — TAMSULOSIN HCL 0.4 MG PO CAPS
0.4000 mg | ORAL_CAPSULE | Freq: Every day | ORAL | Status: DC
  Administered 2012-08-30 – 2012-09-01 (×3): 0.4 mg via ORAL
  Filled 2012-08-29 (×6): qty 1
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
## 2012-08-29 NOTE — Progress Notes (Signed)
Occupational Therapy Treatment Patient Details Name: PURVIS SIDLE MRN: [id number] DOB: 1956/02/12 Today's Date: 08/29/2012 Time: 4098-1191 OT Time Calculation (min): 45 min  OT Assessment / Plan / Recommendation Comments on Treatment Session Pt making great progress, following multi step commands and is able to perform bathing at an overall mod assist level including standing balance.  Pt demonstrates increased awareness of balance issues but still does not state any speech deficits.  Min assist for functional transfers using the RW.      Follow Up Recommendations  No OT follow up       Equipment Recommendations  3 in 1 bedside comode;Tub/shower bench       Frequency Min 2X/week   Plan Discharge plan remains appropriate    Precautions / Restrictions Precautions Precautions: Fall Other Brace/Splint: CAM boot right ankle.   Restrictions Weight Bearing Restrictions: No   Pertinent Vitals/Pain No report of pain    ADL  Eating/Feeding: Performed;Set up Where Assessed - Eating/Feeding: Bed level Grooming: Performed;Set up;Wash/dry hands;Wash/dry face Where Assessed - Grooming: Unsupported sitting Upper Body Bathing: Performed;Set up Where Assessed - Upper Body Bathing: Unsupported sitting Lower Body Bathing: Performed;Moderate assistance Where Assessed - Lower Body Bathing: Supported sit to stand Lower Body Dressing: Performed;Maximal assistance (including donning CAM boot) Toilet Transfer: Simulated;Minimal assistance Toilet Transfer Method: Other (comment);Stand pivot Toilet Transfer Equipment: Other (comment) (simulated to bedside chair) Toileting - Clothing Manipulation and Hygiene: Performed;Moderate assistance Where Assessed - Toileting Clothing Manipulation and Hygiene: Other (comment) (sit to stand from EOB) Transfers/Ambulation Related to ADLs: Pt able to perform stand pivot transfers with min assist using the RW.  Pt with LOB posteriorly at times. ADL Comments: Pt  is currently making good progress.  Consistently following 1-2 step commands and is able to initiate and complete bathing tasks with min instructional cueing.  Did need initial cueing to progress from washing face and neck secondary to perseverating on this.  Still with decreased awareness of place, situation, and some of his deficits.  Acknowledges trouble with his balance and transfers but denies difficulty with speech.       OT Goals ADL Goals ADL Goal: Eating - Progress: Met ADL Goal: Grooming - Progress: Progressing toward goals ADL Goal: Upper Body Bathing - Progress: Met Pt Will Perform Lower Body Bathing: with supervision;Sit to stand from bed;Supported ADL Goal: Lower Body Bathing - Progress: Goal set today ADL Goal: Upper Body Dressing - Progress: Progressing toward goals Pt Will Perform Lower Body Dressing: with supervision;Sit to stand from chair Pt Will Transfer to Toilet: with supervision;with DME;Ambulation;3-in-1 ADL Goal: Toilet Transfer - Progress: Updated due to goal met Miscellaneous OT Goals OT Goal: Miscellaneous Goal #1 - Progress: Met OT Goal: Miscellaneous Goal #2 - Progress: Progressing toward goals  Visit Information  Last OT Received On: 08/29/12 Assistance Needed: +1    Subjective Data  Subjective: I want to be independent. Patient Stated Goal: Pt wants to get back to his "normal routine".      Cognition  Cognition Arousal/Alertness: Awake/alert Behavior During Therapy: WFL for tasks assessed/performed Overall Cognitive Status: Impaired/Different from baseline Area of Impairment: Orientation;Attention;Awareness;Problem solving Current Attention Level: Sustained Memory: Decreased short-term memory Following Commands: Follows multi-step commands consistently Safety/Judgement: Decreased awareness of deficits Awareness: Intellectual General Comments: Pt aware of balance and mobility issues but denies difficulty with his speech at beginning of session.     Mobility  Bed Mobility Bed Mobility: Supine to Sit Supine to Sit: 5: Supervision;HOB elevated Sitting - Scoot to [place]  of Bed: 5: Supervision Transfers Transfers: Sit to Stand Sit to Stand: 4: Min assist;With upper extremity assist;From bed Stand to Sit: 4: Min assist;To chair/3-in-1;With upper extremity assist Details for Transfer Assistance: Pt needing mod instructional cueing for hand placement with sit to stand and to not grab hold of the walker to try to pull up on.  Pt also with uncontrolled descend when moving to sitting from standing position.         Balance Balance Balance Assessed: Yes Static Sitting Balance Static Sitting - Balance Support: Right upper extremity supported;Left upper extremity supported Static Sitting - Level of Assistance: 6: Modified independent (Device/Increase time) Dynamic Sitting Balance Dynamic Sitting - Balance Support: No upper extremity supported Dynamic Sitting - Level of Assistance: 5: Stand by assistance Static Standing Balance Static Standing - Balance Support: Right upper extremity supported;Left upper extremity supported Static Standing - Level of Assistance: 4: Min assist   End of Session OT - End of Session Activity Tolerance: Patient tolerated treatment well Patient left: in chair;with chair alarm set Nurse Communication: Mobility status     Kentrell Hallahan OTR/L Pager number [phone] 08/29/2012, 10:08 AM
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
## 2012-08-29 NOTE — Progress Notes (Signed)
NUTRITION FOLLOW UP  Intervention:    D/C Glucerna Shake Ensure Complete po BID, each supplement provides 350 kcal and 13 grams of protein.  Nutrition Dx:   Inadequate oral intake related to poor appetite and altered mental status as evidenced by </= 50% meal completion. Ongoing.  New Goal:   Intake to meet >90% of estimated nutrition needs. Unmet.  Monitor:   PO intake, wound healing, weight trend, labs.  Assessment:   Patient with a chronic full thickness wound on his right foot, increasing protein and calorie needs. Pt discussed during rounds and with RN. Per RN pt does not like vanilla Glucerna. Pt's blood sugars have been > 147, most are > 200. Medications being adjusted per RN. Pt intake is variable; 10-100%. Per pt he has no appetite and is trying to eat. Pt tried ensure and he likes ensure slightly better.   Height: Ht Readings from Last 1 Encounters:  08/18/12 6\' 4"  (1.93 m)    Weight Status:   Wt Readings from Last 1 Encounters:  08/22/12 278 lb 10.6 oz (126.4 kg)  08/21/12  273 lb 5.9 oz (124 kg) 08/20/12  289 lb 3.9 oz (131.2 kg)  Weight fluctuating with fluid status.  Body mass index is 33.93 kg/(m^2). 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 Wound is the result of a contact cast. Was planning for skin graft on 5/30.  Diet Order: CHO-modified medium   Intake/Output Summary (Last 24 hours) at 08/29/12 0849 Last data filed at 08/29/12 0715  Gross per 24 hour  Intake      0 ml  Output   2500 ml  Net  -2500 ml    Last BM: 5/28   Labs:   Recent Labs Lab 08/24/12 0200 08/25/12 0500 08/29/12 0515  NA 134* 137 138  K 3.8 3.5 3.7  CL 97 100 101  CO2 24 23 27   BUN 16 24* 13  CREATININE 0.82 1.09 0.71  CALCIUM 9.4 9.5 9.4  GLUCOSE 309* 261* 176*    CBG (last 3)   Recent Labs  08/28/12 2359 08/29/12 0402 08/29/12 0751  GLUCAP  172* 147* 177*    Scheduled Meds: . aspirin EC  81 mg Oral Daily  . ciprofloxacin  500 mg Oral BID  . docusate sodium  100 mg Oral BID  . doxycycline  100 mg Oral Q12H  . escitalopram  10 mg Oral Daily  . feeding supplement  237 mL Oral BID BM  . heparin subcutaneous  5,000 Units Subcutaneous Q8H  . insulin aspart  0-15 Units Subcutaneous Q4H  . insulin detemir  45 Units Subcutaneous Daily  . lisinopril  10 mg Oral Daily  . loratadine  10 mg Oral Daily  . pantoprazole  40 mg Oral Q1200  . simvastatin  10 mg Oral q1800  . tamsulosin  0.4 mg Oral QPC supper  . triamcinolone cream   Topical TID    Continuous Infusions: . sodium chloride 15 mL/hr at 08/22/12 [address], LDN, CNSC [phone] Pager [phone] After Hours Pager

## 2012-08-29 NOTE — Progress Notes (Signed)
Physical Therapy Treatment Patient Details Name: Thomas Frye MRN: [id number] DOB: [birth year] Today's Date: 08/29/2012 Time: 1115-1140 PT Time Calculation (min): 25 min  PT Assessment / Plan / Recommendation Comments on Treatment Session  Pt improved with gait and activity tolerance today, continues with balance and cognitive deficits, continues to be a good CIR candidate.    Follow Up Recommendations  CIR     Does the patient have the potential to tolerate intense rehabilitation     Barriers to Discharge        Equipment Recommendations       Recommendations for Other Services Rehab consult  Frequency Min 4X/week   Plan Discharge plan remains appropriate;Frequency remains appropriate    Precautions / Restrictions Precautions Precautions: Fall Other Brace/Splint: cam boot R ankle Restrictions Weight Bearing Restrictions: No   Pertinent Vitals/Pain No c/o pain    Mobility  Bed Mobility Bed Mobility: Supine to Sit Supine to Sit: 5: Supervision;HOB elevated Sitting - Scoot to Edge of Bed: 5: Supervision Transfers Sit to Stand: 4: Min assist Stand to Sit: 4: Min assist;To chair/3-in-1;With upper extremity assist Stand Pivot Transfers: 3: Mod assist Details for Transfer Assistance: Pt requires steadying and lifting assist to stand, cues for hand placement.  Pt requires mod A with turning to transfer with RW due to LOB.  Pt requires manual facilitation for wt shifts and verbal cues for technique Ambulation/Gait Ambulation/Gait Assistance: 4: Min assist Ambulation Distance (Feet): 20 Feet Assistive device: Rolling walker Ambulation/Gait Assistance Details: 2 x 20' with RW with minA.  Pt unable to lift toe on L but able to clear L LE with increased knee flexion.  Pt with 1 LOB during gait requiring max A to correct, pt with delayed balance reactions    Exercises     PT Diagnosis:    PT Problem List:   PT Treatment Interventions:     PT Goals Acute Rehab PT  Goals PT Goal: Supine/Side to Sit - Progress: Progressing toward goal PT Goal: Sit at Edge Of Bed - Progress: Progressing toward goal PT Goal: Sit to Stand - Progress: Progressing toward goal PT Transfer Goal: Bed to Chair/Chair to Bed - Progress: Progressing toward goal PT Goal: Ambulate - Progress: Progressing toward goal PT Goal: Perform Home Exercise Program - Progress: Progressing toward goal  Visit Information  Last PT Received On: 08/29/12 Assistance Needed: +1    Subjective Data  Patient Stated Goal: get moving   Cognition  Cognition Arousal/Alertness: Awake/alert Behavior During Therapy: WFL for tasks assessed/performed Overall Cognitive Status: Impaired/Different from baseline Area of Impairment: Orientation;Attention;Awareness;Problem solving Current Attention Level: Sustained Memory: Decreased short-term memory Following Commands: Follows one step commands with increased time Safety/Judgement: Decreased awareness of deficits Awareness: Intellectual Problem Solving: Slow processing General Comments: Pt aware of balance and mobility issues but denies difficulty with his speech at beginning of session.    Balance  Balance Balance Assessed: Yes Static Sitting Balance Static Sitting - Balance Support: Right upper extremity supported;Left upper extremity supported Static Sitting - Level of Assistance: 6: Modified independent (Device/Increase time) Dynamic Sitting Balance Dynamic Sitting - Balance Support: No upper extremity supported Dynamic Sitting - Level of Assistance: 5: Stand by assistance Static Standing Balance Static Standing - Balance Support: Right upper extremity supported;Left upper extremity supported Static Standing - Level of Assistance: 4: Min assist Dynamic Standing Balance Dynamic Standing - Balance Support: Bilateral upper extremity supported Dynamic Standing - Level of Assistance: 3: Mod assist Dynamic Standing - Comments: pt performed stepping,  tapping and mini squats for LE strengthening.  Pt requires visual and verbal cues to follow 1 step commands to perform exercies.  Pt fatigues easily and requires frequent rests but able to perform exercises with min A with B UE support.  Attempted to have pt stand and stpe without RW, pt limited by anxiety, unable to perform this session  End of Session PT - End of Session Equipment Utilized During Treatment: Gait belt Activity Tolerance: Patient tolerated treatment well Patient left: in chair;with family/visitor present;with chair alarm set Nurse Communication: Mobility status   GP     Shelbia Scinto 08/29/2012, 1:11 PM

## 2012-08-29 NOTE — Progress Notes (Signed)
RT called to room to replace mask due to a broken strap.  Replaced mask and placed distilled H20 in chamber per Pt. Family member.  Pt. Is aware mask has been replaced and water added to chamber.  Pt. Has home CPAP machine and tubing.  Pt. Is aware he can use machine at this time.

## 2012-08-29 NOTE — Progress Notes (Signed)
Patient has been accepted to CIR and he and wife are quite pleased-  Will cancel SNF search and update Rehab CSW.  Reece Levy, MSW, Theresia Majors [phone]

## 2012-08-29 NOTE — Progress Notes (Signed)
Admitting patient to CIR today. Have received approval from [facility] and Dr Lavera Guise reports pt is ready for d/c to CIR. Explained to pt's wife that [employer] reported to me that they most likely would not approve for pt to d/c form CIR to SNF. She understands this and is in agreement with plan to admit pt today to CIR. Pt's CSW and RNCM are aware of plan. For questions,call [phone].

## 2012-08-29 NOTE — Progress Notes (Signed)
RN responded to chair alarm and found pt attempting to get to bed from the chair. Foley was stretched from chair to bed. Foley intact and urine draining adequately. Urine is blood tinged. MD notified. No orders given.

## 2012-08-29 NOTE — Interval H&P Note (Signed)
Thomas Frye was admitted today to Inpatient Rehabilitation with the diagnosis of anoxic BI.  The patient's history has been reviewed, patient examined, and there is no change in status.  Patient continues to be appropriate for intensive inpatient rehabilitation.  I have reviewed the patient's chart and labs.  Questions were answered to the patient's satisfaction.  Mikiala Fugett T 08/29/2012, 9:07 PM

## 2012-08-29 NOTE — PMR Pre-admission (Signed)
PMR Admission Coordinator Pre-Admission Assessment  Patient: Thomas Frye is an 57 y.o., male MRN: [id number] DOB: 29-Mar-1955 Height: 6\' 4"  (193 cm) Weight: 126.4 kg (278 lb 10.6 oz)              Insurance Information HMO:     PPO: yes     PCP:      IPA:      80/20:      OTHER:  PRIMARY: [employer] [place]   Policy#: [id number]      Subscriber: self CM Name: Larita Fife      Phone#: [phone]     Fax#: [phone] Pre-Cert#: [id number]      Employer: [employer] Benefits:  Phone #: [phone]     Name: Barnett Applebaum. Date: 02/26/2012     Deduct: $3500  (Met)    Out of Pocket Max: $3500 (Met)      Life Max: none CIR: 100% after OOP Max met.      SNF: 100% after OOP Max met.  Outpatient: 100% after OOP Max met.      30 visit limit per year (combined PT,OT,ST & HH and Outpatient therapies) Home Health: 100% after OOP Max met.       Co-Pay: 0 DME: 100% after OOP Max met.      Co-Pay: 0 Providers: in network  Emergency Conservator, museum/gallery Information   Name Relation Home Work Mobile   Vidana,Christy Spouse [phone]     Current Medical History  Patient Admitting Diagnosis: Anoxic brain injury  History of Present Illness: 08/24/12: 57 y.o. male with history of DM with diabetic foot ulcer, OSA, depression with suicide attempt in the past; who was found in a parked care on 08/18/12. He was unresponsive with agonal respirations, posturing and profoundly hypoglycemic. He was intubated in the field and treated with dextrose but remained minimally responsive. 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 Neurology consulted and felt patient with severe hypoglycemic encephalopathy with question of seizure activity. He was started on IV antibiotics for cellulitis and question of aspiration PNA. He was extubated on 05/27 and psychiatry consulted due to concerns of insulin overdose/suicide attempt. He felt that patient with poor insight, judgement and impulse  control and recommends inpatient acute psychiatric hospitalization once medically cleared. Mentation slowly improving with decrease in lethargy and ability to follow basic commands and able to sustain attention to tasks.. 08/29/12 UPDATE:  Severe diabetic hypoglycemia BS in the 190-200. We did titrate up levemir to 45units, cont SSI . 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 Cognition improving. Able to sustain attention to tasks. Urinary retention: Foley placed on 08/27/12.  (R) foot wound: He has a Hx of HTN and PVD secondary to DM. He has a space boot on rt ankle and a rt foot wound has purulent drainage and has obviously been treated by WOC type facility. Cont Cipro and Vibramycin (was on preadmit by OP wound care)   Past Medical History  Past Medical History  Diagnosis Date  . Diabetes mellitus   . Hypertension   . Depression   . GERD (gastroesophageal reflux disease)   . H/O hiatal hernia   . Neuromuscular disorder     neuropathy  . Sleep apnea     wears cpap at night    Family History  family history is not on  file.  Prior Rehab/Hospitalizations: no prior inpatient rehab   Current Medications  Current facility-administered medications:0.9 %  sodium chloride infusion, , Intravenous, Continuous, Nelda Bucks, MD, Last Rate: 15 mL/hr at 08/22/12 1900;  acetaminophen (TYLENOL) tablet 650 mg, 650 mg, Oral, Q6H PRN, Mallie Darting, PA-C, 650 mg at 08/23/12 2356;  aspirin EC tablet 81 mg, 81 mg, Oral, Daily, [name], MD, 81 mg at 08/29/12 1000 ciprofloxacin (CIPRO) tablet 500 mg, 500 mg, Oral, BID, Jerald Kief, MD, 500 mg at 08/29/12 1000;  docusate sodium (COLACE) capsule 100 mg, 100 mg, Oral, BID, Jerald Kief, MD, 100 mg at 08/29/12 1000;  doxycycline (VIBRA-TABS) tablet 100 mg, 100 mg, Oral,  Q12H, Jerald Kief, MD, 100 mg at 08/29/12 1001;  escitalopram (LEXAPRO) tablet 10 mg, 10 mg, Oral, Daily, Jerald Kief, MD, 10 mg at 08/29/12 1000 feeding supplement (ENSURE COMPLETE) liquid 237 mL, 237 mL, Oral, BID BM, Heather Cornelison Pitts, RD, 237 mL at 08/29/12 1001;  heparin injection 5,000 Units, 5,000 Units, Subcutaneous, Q8H, Roxine Caddy, MD, 5,000 Units at 08/29/12 1914;  hydrALAZINE (APRESOLINE) injection 20 mg, 20 mg, Intravenous, Q6H PRN, Nelda Bucks, MD, 20 mg at 08/23/12 1750 insulin aspart (novoLOG) injection 0-15 Units, 0-15 Units, Subcutaneous, Q4H, Roxine Caddy, MD, 3 Units at 08/29/12 0801;  insulin detemir (LEVEMIR) injection 45 Units, 45 Units, Subcutaneous, Daily, Jerald Kief, MD, 45 Units at 08/29/12 1000;  lisinopril (PRINIVIL,ZESTRIL) tablet 10 mg, 10 mg, Oral, Daily, Jerald Kief, MD, 10 mg at 08/29/12 1000;  loratadine (CLARITIN) tablet 10 mg, 10 mg, Oral, Daily, Sorin C Laza, MD, 10 mg at 08/29/12 1000 pantoprazole (PROTONIX) EC tablet 40 mg, 40 mg, Oral, Q1200, Jerald Kief, MD, 40 mg at 08/29/12 1001;  simvastatin (ZOCOR) tablet 10 mg, 10 mg, Oral, q1800, Jerald Kief, MD, 10 mg at 08/28/12 1716;  sodium chloride 0.9 % injection 10-40 mL, 10-40 mL, Intracatheter, PRN, Storm Frisk, MD, 10 mL at 08/24/12 2139;  tamsulosin (FLOMAX) capsule 0.4 mg, 0.4 mg, Oral, QPC supper, Jerald Kief, MD, 0.4 mg at 08/28/12 1716 triamcinolone cream (KENALOG) 0.5 %, , Topical, TID, Violet Baldy Reidler, PA-C  Patients Current Diet: Carb Control  Precautions / Restrictions Precautions Precautions: Fall Other Brace/Splint: CAM boot right ankle.   Restrictions Weight Bearing Restrictions: No Other Position/Activity Restrictions: Has blue PRAFO for in the bed.     Prior Activity Level Community (5-7x/wk): Active daily Home Assistive Devices / Equipment Home Assistive Devices/Equipment: None Home Adaptive Equipment: None  Prior Functional Level Prior  Function Level of Independence: Independent Able to Take Stairs?: Yes Driving: Yes Vocation: Full time employment  Current Functional Level Cognition  Arousal/Alertness: Awake/alert Overall Cognitive Status: Impaired/Different from baseline Current Attention Level: Sustained Memory: Decreased short-term memory Orientation Level: Oriented X4 Following Commands: Follows multi-step commands consistently Safety/Judgement: Decreased awareness of deficits General Comments: Pt aware of balance and mobility issues but denies difficulty with his speech at beginning of session. Attention: Focused Focused Attention: Impaired Focused Attention Impairment: Verbal basic;Functional basic (needs max verbal and visual cues to focus attention) Executive Function: Initiating Initiating: Impaired Initiating Impairment: Verbal basic;Functional basic    Extremity Assessment (includes Sensation/Coordination)  RUE ROM/Strength/Tone: Deficits;Unable to fully assess;Due to impaired cognition RUE ROM/Strength/Tone Deficits: able to reach to 90 degrees shoulder flexion, full AROM elbow to hand per observation RUE Coordination: Deficits  RLE ROM/Strength/Tone: Deficits;Unable to fully assess;Due to impaired cognition RLE ROM/Strength/Tone Deficits: able to perform some  strength assessment but limited by lethargy and focused attention    ADLs  Eating/Feeding: Performed;Set up Where Assessed - Eating/Feeding: Bed level Grooming: Performed;Set up;Wash/dry hands;Wash/dry face Where Assessed - Grooming: Unsupported sitting Upper Body Bathing: Performed;Set up Where Assessed - Upper Body Bathing: Unsupported sitting Lower Body Bathing: Performed;Moderate assistance Where Assessed - Lower Body Bathing: Supported sit to stand Lower Body Dressing: Performed;Maximal assistance (including donning CAM boot) Where Assessed - Lower Body Dressing: Unsupported sitting Toilet Transfer: Simulated;Minimal  assistance Toilet Transfer Method: Other (comment);Stand pivot Toilet Transfer Equipment: Other (comment) (simulated to bedside chair) Toileting - Clothing Manipulation and Hygiene: Performed;Moderate assistance Where Assessed - Toileting Clothing Manipulation and Hygiene: Other (comment) (sit to stand from EOB) Transfers/Ambulation Related to ADLs: Pt able to perform stand pivot transfers with min assist using the RW.  Pt with LOB posteriorly at times. ADL Comments: Pt is currently making good progress.  Consistently following 1-2 step commands and is able to initiate and complete bathing tasks with min instructional cueing.  Did need initial cueing to progress from washing face and neck secondary to perseverating on this.  Still with decreased awareness of place, situation, and some of his deficits.  Acknowledges trouble with his balance and transfers but denies difficulty with speech.      Mobility  Bed Mobility: Supine to Sit Supine to Sit: 5: Supervision;HOB elevated Sitting - Scoot to Edge of Bed: 5: Supervision    Transfers  Transfers: Sit to Stand;Stand to [employer] Transfers Sit to Stand: 4: Min assist;With upper extremity assist;From bed Sit to Stand: Patient Percentage: 40% (to 50%) Stand to Sit: 4: Min assist;To chair/3-in-1;With upper extremity assist Stand to Sit: Patient Percentage: 40% Stand Pivot Transfers: 3: Mod assist;From elevated surface Stand Pivot Transfers: Patient Percentage: 40%    Ambulation / Gait / Stairs / Wheelchair Mobility  Ambulation/Gait Ambulation/Gait Assistance: 3: Mod assist Ambulation Distance (Feet): 35 Feet Assistive device: Rolling walker Ambulation/Gait Assistance Details: up to mod assist for balance during gait with RW.  Mod assist needed x 2 while turning to prevent fall.  Verbal cues for safety with RW use and leg sequencing while turning.   Gait Pattern: Step-to pattern;Antalgic;Decreased dorsiflexion - left;Left steppage Gait  velocity: less than 1.8 ft/sec indicating risk for recurrent falls.  Wheelchair Mobility Wheelchair Mobility: No    Posture / Games developer Sitting - Balance Support: Right upper extremity supported;Left upper extremity supported Static Sitting - Level of Assistance: 6: Modified independent (Device/Increase time) Static Sitting - Comment/# of Minutes: patient able to maintain balance breifly but once focus lost patient unable to control movements and falls back into chair  Dynamic Sitting Balance Dynamic Sitting - Balance Support: No upper extremity supported Dynamic Sitting - Level of Assistance: 5: Stand by assistance Dynamic Sitting - Comments: attmepted postural activities and perturbations. Patient easily shifted and unable to make any self corrections at this time.  Static Standing Balance Static Standing - Balance Support: Right upper extremity supported;Left upper extremity supported Static Standing - Level of Assistance: 4: Min assist Dynamic Standing Balance Dynamic Standing - Balance Support: Bilateral upper extremity supported Dynamic Standing - Level of Assistance: 3: Mod assist    Special needs/care consideration CPAP: yes Continuous Drip IV: yes Skin:  Chronic Stage IV DM ulcer on (R) foot. (Present on admission). Moist - dry dressing with CAM boot, Blue PRAFO boot in bed. Rash on upper back and shoulders. Bowel mgmt: Incontinent (unaware of need for toileting) Bladder mgmt: On 08/27/12  Urinary retention noted, #16 Fr Foley inserted. Diabetic mgmt: Yes. Need for education. Safety: Poor safety awareness. Needs bed alarm. Psych history    Previous Home Environment Living Arrangements: Alone Lives With: Family Available Help at Discharge: Family Type of Home: House Home Layout: One level Home Access: Stairs to enter Entrance Stairs-Rails: Left Entrance Stairs-Number of Steps: 4 Bathroom Shower/Tub: Engineer, manufacturing systems:  Standard Home Care Services: No  Discharge Living Setting Plans for Discharge Living Setting: Will live with wife.  Type of Home at Discharge: Wife is in process of looking for accessible apt. Discharge Home Layout: (TBD) Discharge Home Access:  (TBD) Do you have any problems obtaining your medications?: No  Social/Family/Support Systems Patient Roles: Spouse;Parent Contact Information: Wife's phone: [phone] Anticipated Caregiver: Wife: Eman Morimoto, other family members to assist (4 adult children. 3 are local. Also pt's parents are local) Anticipated Caregiver's Contact Information: Lorene Dy: [phone] Ability/Limitations of Caregiver: none Caregiver Availability: 24/7 (Wife works but will adjust her schedule and arrange 24/7 assist as needed.) Discharge Plan Discussed with Primary Caregiver: Yes Is Caregiver In Agreement with Plan?: Yes Does Caregiver/Family have Issues with Lodging/Transportation while Pt is in Rehab?: No  Goals/Additional Needs Patient/Family Goal for Rehab: PT: Min assist, OT & ST: supervision - min assist Expected length of stay: 3 weeks Cultural Considerations: none Equipment Needs: TBD Special Service Needs: Possibly Neuropsych Additional Information: Patient and wife have been separated for 8 months. He has been staying with his parents. She and her 73 yo daughter were staying with her brother. Wife is very dedicated to taking care of pt and is looking for housing. She states that pt's employer will continue paying for his insurance and paying pt for 6 months. Pt/Family Agrees to Admission and willing to participate: Yes Program Orientation Provided & Reviewed with Pt/Caregiver Including Roles  & Responsibilities: Yes  Decrease burden of Care through IP rehab admission: Specialzed equipment needs, Decrease number of caregivers, Bowel and bladder program and Patient/family education, wound care education, safety education.  Possible need for  SNF placement upon discharge: Not anticipated. Patient's wife is aware that BCBS will most likely not approve SNF after CIR.  Patient Condition: This patient's medical and functional status has changed since the consult dated: 08/24/12 in which the Rehabilitation Physician determined and documented that the patient's condition is appropriate for intensive rehabilitative care in an inpatient rehabilitation facility. Medical changes are noted above under "Hx of Present Illness".  Functional changes are: Patient has increased awareness and attention.  Ambulating 5' with mod assist, transfers with min assit. After evaluating the patient today and speaking with the Rehabilitation physician and acute team, the patient remains appropriate for inpatient rehab. Will admit to inpatient rehab today.  Preadmission Screen Completed By:  Meryl Dare, 08/29/2012 10:54 AM ______________________________________________________________________   Discussed status with Dr. Riley Kill on 08/29/12 at 1150 and received telephone approval for admission today.  Admission Coordinator:  Meryl Dare, time 11:58/Date 08/29/12

## 2012-08-29 NOTE — Progress Notes (Signed)
Pt arrived to unit at 1730. Waiting for wife to arrive to review rehab process and booklet.  Pt aware of safety plan/sheet with signature and verbal understanding.  Pt resting in bed, call bell within reach and bed alarm on.
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
## 2012-08-30 ENCOUNTER — Inpatient Hospital Stay (HOSPITAL_COMMUNITY): Payer: BC Managed Care – PPO | Admitting: Speech Pathology

## 2012-08-30 ENCOUNTER — Inpatient Hospital Stay (HOSPITAL_COMMUNITY): Payer: BC Managed Care – PPO | Admitting: Physical Therapy

## 2012-08-30 ENCOUNTER — Inpatient Hospital Stay (HOSPITAL_COMMUNITY): Payer: BC Managed Care – PPO | Admitting: Occupational Therapy

## 2012-08-30 DIAGNOSIS — G931 Anoxic brain damage, not elsewhere classified: Secondary | ICD-10-CM | POA: Diagnosis present

## 2012-08-30 DIAGNOSIS — IMO0001 Reserved for inherently not codable concepts without codable children: Secondary | ICD-10-CM

## 2012-08-30 DIAGNOSIS — E1165 Type 2 diabetes mellitus with hyperglycemia: Secondary | ICD-10-CM

## 2012-08-30 DIAGNOSIS — E1142 Type 2 diabetes mellitus with diabetic polyneuropathy: Secondary | ICD-10-CM

## 2012-08-30 LAB — COMPREHENSIVE METABOLIC PANEL
ALT: 11 U/L (ref 0–53)
AST: 14 U/L (ref 0–37)
Albumin: 2.6 g/dL — ABNORMAL LOW (ref 3.5–5.2)
Calcium: 9.2 mg/dL (ref 8.4–10.5)
Chloride: 100 mEq/L (ref 96–112)
Creatinine, Ser: 0.77 mg/dL (ref 0.50–1.35)
Sodium: 138 mEq/L (ref 135–145)
Total Bilirubin: 0.4 mg/dL (ref 0.3–1.2)

## 2012-08-30 LAB — CBC WITH DIFFERENTIAL/PLATELET
Basophils Absolute: 0 10*3/uL (ref 0.0–0.1)
Basophils Relative: 0 % (ref 0–1)
HCT: 32.8 % — ABNORMAL LOW (ref 39.0–52.0)
Lymphocytes Relative: 12 % (ref 12–46)
MCHC: 34.1 g/dL (ref 30.0–36.0)
Monocytes Absolute: 0.9 10*3/uL (ref 0.1–1.0)
Neutro Abs: 9.6 10*3/uL — ABNORMAL HIGH (ref 1.7–7.7)
Platelets: 259 10*3/uL (ref 150–400)
RDW: 13.2 % (ref 11.5–15.5)
WBC: 12.2 10*3/uL — ABNORMAL HIGH (ref 4.0–10.5)

## 2012-08-30 LAB — GLUCOSE, CAPILLARY
Glucose-Capillary: 156 mg/dL — ABNORMAL HIGH (ref 70–99)
Glucose-Capillary: 315 mg/dL — ABNORMAL HIGH (ref 70–99)
Glucose-Capillary: 226 mg/dL — ABNORMAL HIGH (ref 70–99)

## 2012-08-30 LAB — CULTURE, BLOOD (ROUTINE X 2)
Culture: NO GROWTH
Culture: NO GROWTH

## 2012-08-30 MED ORDER — PRO-STAT SUGAR FREE PO LIQD
30.0000 mL | Freq: Three times a day (TID) | ORAL | Status: DC
  Administered 2012-08-30 – 2012-09-03 (×11): 30 mL via ORAL
  Filled 2012-08-30 (×15): qty 30

## 2012-08-30 NOTE — Plan of Care (Signed)
Problem: RH BOWEL ELIMINATION Goal: RH STG MANAGE BOWEL WITH ASSISTANCE STG Manage Bowel with mod Assistance.  Outcome: Not Progressing Patient incontinent x2 requires max assist no awareness of bm .

## 2012-08-30 NOTE — Progress Notes (Signed)
INITIAL NUTRITION ASSESSMENT  DOCUMENTATION CODES Per approved criteria  -Obesity unspecified   INTERVENTION: 1.  Meals/snacks; encouraged pt to consume at least 2 snacks daily- supplement vs. Nourishment item.  Pt agreeable. Mix Prostat with 2-4 oz of juice or diet soda.  If Glucerna too sweet for pt, dilute with 1 oz 2% milk.  2.  General healthful diet; encouraged pt to consume >50% of all meals.  Stated ability of family and friends to bring additional snacks and favorite foods to increase kcal and protein intake. 3.  Modify diet; consider diet liberalization to CHO Mod High or Regular if no improvement in intake and pt with continued poor compliance with supplements.    NUTRITION DIAGNOSIS: Inadequate oral intake related to poor appetite as evidenced by PO <50% of meals, refusing supplements.   Monitor:  1.  Food/Beverage; intake sufficient to meet >/=90% estimated needs with meals, snacks, and supplements 2.  Skin; s/s healing at wounds  Reason for Assessment: consult  57 y.o. male  Admitting Dx: Anoxic encephalopathy  ASSESSMENT: Pt admitted to CIR for rehab r/t anoxic brain injury.  Pt followed by inpatient clinical nutrition staff during recent admission due to poor appetite and intake.  Pt intake was consistently <50% of meals and he had difficulty finding a supplement he tolerated well.   Discussed intake and food preferences with pt at visit.  Consult received to discuss food choices with wife, however she is not at bedside at visit.  Pt reports he does not like all supplements he has tried.  He is currently ordered to receive Glucerna TID and Prostat TID.  Pt agreeable to leaving supplements as ordered, however does not expect to consume all of them.  Discussed using supplement regimen as an encouragement to improve intake.  Pt may select unit nourishment instead of supplements.  Will also have [employer] Breakfast sent on breakfast tray for pt to consume prn.   Pt  endorses appetite as his largest barrier to intake. Discussed ways to encourage appetite.   If no improvement in intake and continued poor compliance with supplement regimen, consider diet liberalization to CHO Mod High or Regular.   Height: Ht Readings from Last 1 Encounters:  08/29/12 6\' 4"  (1.93 m)    Weight: Wt Readings from Last 1 Encounters:  08/29/12 284 lb 13.4 oz (129.2 kg)    Ideal Body Weight: 91.8 kg  % Ideal Body Weight: 140%  Wt Readings from Last 10 Encounters:  08/29/12 284 lb 13.4 oz (129.2 kg)  08/22/12 278 lb 10.6 oz (126.4 kg)  12/10/10 286 lb (129.729 kg)    Usual Body Weight: 285 lbs  % Usual Body Weight: 100%  BMI:  Body mass index is 34.69 kg/(m^2).  Estimated Nutritional Needs: Kcal: 2400-2600 Protein: 120-140g Fluid: >2.5 L/day  Skin: stage 4 pressure ulcer on right foot; WOC RN is following. Wound is the result of a contact cast. Was planning for skin graft on 5/30.  Diet Order: Carb Control  EDUCATION NEEDS: -Education needs addressed   Intake/Output Summary (Last 24 hours) at 08/30/12 1537 Last data filed at 08/30/12 1300  Gross per 24 hour  Intake    480 ml  Output   1000 ml  Net   -520 ml    Last BM: 6/5  Labs:   Recent Labs Lab 08/25/12 0500 08/29/12 0515 08/30/12 0535  NA 137 138 138  K 3.5 3.7 3.8  CL 100 101 100  CO2 23 27 29   BUN  24* 13 11  CREATININE 1.09 0.71 0.77  CALCIUM 9.5 9.4 9.2  GLUCOSE 261* 176* 175*    CBG (last 3)   Recent Labs  08/30/12 0404 08/30/12 0758 08/30/12 1152  GLUCAP 156* 202* 315*    Scheduled Meds: . aspirin EC  81 mg Oral Daily  . ciprofloxacin  500 mg Oral BID  . docusate sodium  100 mg Oral BID  . doxycycline  100 mg Oral Q12H  . enoxaparin (LOVENOX) injection  40 mg Subcutaneous Q24H  . escitalopram  10 mg Oral Daily  . feeding supplement  237 mL Oral TID WC  . feeding supplement  30 mL Oral TID WC  . insulin aspart  0-15 Units Subcutaneous Q4H  . insulin detemir   45 Units Subcutaneous Daily  . lisinopril  10 mg Oral Daily  . loratadine  10 mg Oral Daily  . pantoprazole  40 mg Oral Q1200  . saccharomyces boulardii  250 mg Oral BID  . simvastatin  10 mg Oral q1800  . tamsulosin  0.4 mg Oral QPC supper  . triamcinolone cream   Topical TID    Continuous Infusions:   Past Medical History  Diagnosis Date  . Diabetes mellitus   . Hypertension   . Depression   . GERD (gastroesophageal reflux disease)   . H/O hiatal hernia   . Neuromuscular disorder     neuropathy  . Sleep apnea     wears cpap at night    Past Surgical History  Procedure Laterality Date  . Toe amputation    . Eye surgery    . No past surgeries    . Fracture surgery      Questionable steel screws to right tibia    Loyce Dys, MS RD LDN Clinical Inpatient Dietitian Pager: [phone] Weekend/After hours pager: [phone]

## 2012-08-30 NOTE — Progress Notes (Signed)
3:30 am Patient remove CPAP and stated that he didn't want to wear it.

## 2012-08-30 NOTE — Plan of Care (Signed)
Problem: RH BLADDER ELIMINATION Goal: RH STG MANAGE BLADDER WITH ASSISTANCE STG Manage Bladder With mod Assistance  Outcome: Not Applicable Date Met:  08/30/12 Patient has foley at this time .

## 2012-08-30 NOTE — Progress Notes (Signed)
Patient already on CPAP at this time. Tolerating well, RT will continue to monitor.

## 2012-08-30 NOTE — Evaluation (Signed)
Speech Language Pathology Assessment and Plan  Patient Details  Name: Thomas Frye MRN: [id number] Date of Birth: 1956/01/25  SLP Diagnosis: Cognitive Impairments;Aphasia  Rehab Potential: Excellent ELOS: 2-3 weeks   Today's Date: 08/30/2012 Time: 1300-1355 Time Calculation (min): 55 min  Skilled Therapeutic Intervention: Administered cognitive-linguistic evaluation. Please see below for details. Pt educated on current cognitive-linguistic deficits and goals of skilled SLP intervention. Pt verbalized understanding but needs reinforcement.   Problem List:  Patient Active Problem List   Diagnosis Date Noted  . Anoxic encephalopathy 08/30/2012  . ? 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/2012  . 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 Acute respiratory failure in setting of profound hypoglycemia 08/18/2012  . Severe diabetic hypoglycemia 08/18/2012  . DM (diabetes mellitus), type 2 with peripheral vascular complications 08/18/2012  . Acute encephalopathy 08/18/2012  . HTN (hypertension) 08/18/2012  . Noncompliance 08/18/2012   Past Medical History:  Past Medical History  Diagnosis Date  . Diabetes mellitus   . Hypertension   . Depression   . GERD (gastroesophageal reflux disease)   . H/O hiatal hernia   . Neuromuscular disorder     neuropathy  . Sleep apnea     wears cpap at night   Past Surgical History:  Past Surgical History  Procedure Laterality Date  . Toe amputation    . Eye surgery    . No past surgeries    . Fracture surgery      Questionable steel screws to right tibia    Assessment / Plan / Recommendation Clinical Impression  Patient is a 57 y.o. year old male [name] with history of DM with diabetic foot ulcer, OSA, depression with suicide attempt in the past; who was found in a parked car on 08/18/12. He was unresponsive with agonal respirations, posturing and profoundly hypoglycemic. He was intubated in the  field and treated with dextrose but remained minimally responsive. 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 Neurology consulted and felt patient with severe hypoglycemic encephalopathy with question of seizure activity. He was started on IV antibiotics due to cellulitis and question of aspiration PNA. He was extubated on 05/27 and psychiatry consulted due to concerns of insulin overdose/suicide attempt. He felt that patient with poor insight, judgment and impulse control and initially recommended inpatient acute psychiatric hospitalization once medically cleared. Mentation slowly improving with decrease in lethargy and improvement in ability to follow basic commands and sustain attention to tasks. He was cleared by psychiatry to resume antidepressants as no SI noted reported. Neurology recommended ASA and statin due to findings of old infarct on CCT. He has failed voiding trial requiring in and out caths intermittently and foley replaced yesterday. Patient transferred to CIR on 08/29/2012 and presents with moderate cognitive impairments impacting initiation, attention, awareness, problem solving, safety awareness, working memory and processing. Pt also demonstrates mild-moderate language impairments characterized by decreased prosody and intonation with perseveration and decreased word-finding with poor awareness of errors.  Patient will benefit from skilled SLP intervention to maximize cognitive function and functional communication and increase independence prior to discharge home with care partner. Anticipate patient will require 24 hour supervision and follow up outpatient.     SLP Assessment  Patient will need skilled Speech Lanaguage Pathology Services during CIR admission    Recommendations  Recommendations for Other Services: Neuropsych consult Patient destination: Home Follow up Recommendations: Outpatient SLP;24 hour supervision/assistance Equipment Recommended: None recommended by SLP     SLP Frequency 5  out of 7 days   SLP Treatment/Interventions Cognitive remediation/compensation;Cueing hierarchy;Environmental controls;Functional tasks;Internal/external aids;Speech/Language facilitation;Patient/family education;Therapeutic Activities    Pain Pain Assessment Pain Assessment: No/denies pain Prior Functioning Type of Home: House Lives With: Spouse;Family Available Help at Discharge: Family Vocation: Full time employment  Short Term Goals: Week 1: SLP Short Term Goal 1 (Week 1): Pt will demonstrate sustained attention to task for 30 miutes with supervision verbal cues for redirection SLP Short Term Goal 2 (Week 1): Pt will demonstrate functional problem solving with basic and familiar tasks with Min A verbal and question cues.  SLP Short Term Goal 3 (Week 1): Pt will identify 1 physical and 1 cognitive deficit with Min A semantic and question cues.  SLP Short Term Goal 4 (Week 1): Pt will utilize external memory aids to increase recall/carryover of newly learned information with Min A verbal and question cues.  SLP Short Term Goal 5 (Week 1): Pt will express wants/needs at the sentence level with Min A question cues to self-monitor and correct errors.   See FIM for current functional status Refer to Care Plan for Long Term Goals  Recommendations for other services: Neuropsych  Discharge Criteria: Patient will be discharged from SLP if patient refuses treatment 3 consecutive times without medical reason, if treatment goals not met, if there is a change in medical status, if patient makes no progress towards goals or if patient is discharged from hospital.  The above assessment, treatment plan, treatment alternatives and goals were discussed and mutually agreed upon: by patient  Thomas Frye 08/30/2012, 3:34 PM

## 2012-08-30 NOTE — Progress Notes (Signed)
[facility] Individual Statement of Services  Patient Name:  Thomas Frye  Date:  08/30/2012  Welcome to the [facility].  Our goal is to provide you with an individualized program based on your diagnosis and situation, designed to meet your specific needs.  With this comprehensive rehabilitation program, you will be expected to participate in at least 3 hours of rehabilitation therapies Monday-Friday, with modified therapy programming on the weekends.  Your rehabilitation program will include the following services:  Physical Therapy (PT), Occupational Therapy (OT), Speech Therapy (ST), 24 hour per day rehabilitation nursing, Therapeutic Recreaction (TR), Neuropsychology, Case Management (Social Worker), Rehabilitation Medicine, Nutrition Services and Pharmacy Services  Weekly team conferences will be held on Tuesdays to discuss your progress.  Your Social Worker will talk with you frequently to get your input and to update you on team discussions.  Team conferences with you and your family in attendance may also be held.  Expected length of stay: 3 weeks  Overall anticipated outcome: supervision  Depending on your progress and recovery, your program may change. Your Social Worker will coordinate services and will keep you informed of any changes. Your Social Worker's name and contact numbers are listed  below.  The following services may also be recommended but are not provided by the [facility]patient [facility]:   Driving Evaluations  Home Health Rehabiltiation Services  Outpatient Rehabilitatation [facility]  Vocational Rehabilitation   Arrangements will be made to provide these services after discharge if needed.  Arrangements include referral to agencies that provide these services.  Your insurance has been verified to be:  [employer] of [place] Your primary doctor is:  Dr. Fayrene Fearing Day  Pertinent information will be shared with your doctor and your  insurance company.  Social Worker:  [place], [place] [phone] or [phone]  Information discussed with and copy given to patient by: Amada Jupiter, 08/30/2012, 2:15 PM

## 2012-08-30 NOTE — Progress Notes (Signed)
Patient information reviewed and entered into eRehab system by Donalyn Schneeberger, RN, CRRN, PPS Coordinator.  Information including medical coding and functional independence measure will be reviewed and updated through discharge.    

## 2012-08-30 NOTE — Evaluation (Signed)
Occupational Therapy Assessment and Plan  Patient Details  Name: Thomas Frye MRN: [id number] Date of Birth: 02-07-1956  OT Diagnosis: cognitive deficits and hemiplegia affecting dominant side Rehab Potential: Rehab Potential: Good ELOS: 3 weeks   Today's Date: 08/30/2012 Time: 0930-1030 Time Calculation (min): 60 min  Problem List:  Patient Active Problem List   Diagnosis Date Noted  . Anoxic encephalopathy 08/30/2012  . ? 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/2012  . 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 Acute respiratory failure in setting of profound hypoglycemia 08/18/2012  . Severe diabetic hypoglycemia 08/18/2012  . DM (diabetes mellitus), type 2 with peripheral vascular complications 08/18/2012  . Acute encephalopathy 08/18/2012  . HTN (hypertension) 08/18/2012  . Noncompliance 08/18/2012    Past Medical History:  Past Medical History  Diagnosis Date  . Diabetes mellitus   . Hypertension   . Depression   . GERD (gastroesophageal reflux disease)   . H/O hiatal hernia   . Neuromuscular disorder     neuropathy  . Sleep apnea     wears cpap at night   Past Surgical History:  Past Surgical History  Procedure Laterality Date  . Toe amputation    . Eye surgery    . No past surgeries    . Fracture surgery      Questionable steel screws to right tibia    Assessment & Plan Clinical Impression: Patient is a 57 y.o. year old male [name] with history of DM with diabetic foot ulcer, OSA, depression with suicide attempt in the past; who was found in a parked care on 08/18/12. He was unresponsive with agonal respirations, posturing and profoundly hypoglycemic. He was intubated in the field and treated with dextrose but remained minimally responsive. 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 Neurology consulted and felt patient with severe hypoglycemic encephalopathy with question of seizure activity. He was started on  IV antibiotics due to cellulitis and question of aspiration PNA. He was extubated on 05/27 and psychiatry consulted due to concerns of insulin overdose/suicide attempt. He felt that patient with poor insight, judgement and impulse control and initially recommended inpatient acute psychiatric hospitalization once medically cleared. Mentation slowly improving with decrease in lethargy and improvement in ability to follow basic commands and sustain attention to tasks. He was cleared by psychiatry to resume antidepressants as no SI noted reported. Neurology recommended ASA and statin due to findings of old infarct on CCT. He has failed voiding trial requiring in and out caths intermittently and foley replaced yesterday.  Patient transferred to CIR on 08/29/2012 .    Patient currently requires mod to max A  with basic self-care skills and basic mobility  secondary to muscle weakness, decreased cardiorespiratoy endurance, impaired timing and sequencing, unbalanced muscle activation, decreased coordination and decreased motor planning, decreased initiation, decreased attention, decreased awareness, decreased problem solving, decreased safety awareness, decreased memory and delayed processing and decreased standing balance, decreased postural control, hemiplegia, decreased balance strategies and difficulty maintaining precautions.  Prior to hospitalization, patient could complete ADL with independent .  Patient will benefit from skilled intervention to decrease level of assist with basic self-care skills and increase independence with basic self-care skills prior to discharge home with care partner.  Anticipate patient will require 24 hour supervision and follow up outpatient.  OT - End of Session Activity Tolerance: Tolerates 10 - 20 min activity with multiple rests Endurance Deficit: Yes OT Assessment Rehab Potential: Good OT Plan OT Intensity: Minimum  of 1-2 x/day, 45 to 90 minutes OT Frequency: 5 out of 7  days OT Duration/Estimated Length of Stay: 3 weeks OT Treatment/Interventions: Balance/vestibular training;Cognitive remediation/compensation;Community reintegration;Discharge planning;DME/adaptive equipment instruction;Disease mangement/prevention;Neuromuscular re-education;Patient/family education;Self Care/advanced ADL retraining;Splinting/orthotics;Therapeutic Exercise;UE/LE Coordination activities;UE/LE Strength taining/ROM;Therapeutic Activities;Psychosocial support;Skin care/wound managment;Pain management;Functional mobility training OT Recommendation Patient destination: Home Follow Up Recommendations: Outpatient OT   Skilled Therapeutic Intervention   OT Evaluation Precautions/Restrictions  Precautions Precautions: Fall Required Braces or Orthoses: Other Brace/Splint Other Brace/Splint: cam boot R ankle when OOB Restrictions Weight Bearing Restrictions: No Other Position/Activity Restrictions: Has blue PRAFO for in the bed.   General Chart Reviewed: Yes Family/Caregiver Present: No Vital Signs   Pain Pain Assessment Pain Assessment: No/denies pain Pain Score: 0-No pain Home Living/Prior Functioning Home Living Lives With: Family;Spouse Available Help at Discharge: Family ADL   Vision/Perception  Vision - History Baseline Vision: Wears glasses all the time Vision - Assessment Eye Alignment: Within Functional Limits Vision Assessment: Vision tested Tracking/Visual Pursuits: Decreased smoothness of horizontal tracking;Decreased smoothness of vertical tracking Saccades: Decreased speed of saccadic movement Perception Perception: Within Functional Limits Praxis Praxis: Impaired Praxis Impairment Details: Motor planning  Cognition Overall Cognitive Status: Impaired/Different from baseline Arousal/Alertness: Awake/alert Orientation Level: Oriented to person;Oriented to place;Disoriented to situation;Oriented to time Attention: Sustained Sustained Attention:  Impaired Sustained Attention Impairment: Functional basic Memory: Impaired Memory Impairment: Decreased recall of new information;Decreased short term memory Awareness: Impaired Awareness Impairment: Intellectual impairment Problem Solving: Impaired Problem Solving Impairment: Functional basic Executive Function: Initiating Initiating: Impaired Initiating Impairment: Functional basic Safety/Judgment: Impaired Sensation Sensation Light Touch: Impaired Detail Light Touch Impaired Details: Impaired RLE;Impaired LLE Proprioception: Impaired Detail Proprioception Impaired Details: Impaired RLE;Impaired LLE Coordination Gross Motor Movements are Fluid and Coordinated: No Fine Motor Movements are Fluid and Coordinated: No Coordination and Movement Description: left hand decr coordination Motor  Motor Motor: Motor apraxia;Abnormal postural alignment and control Motor - Skilled Clinical Observations: generalized weakness left >right, foot drop Mobility  Transfers Sit to Stand: 4: Min assist Stand to Sit: 4: Min assist;To chair/3-in-1;With upper extremity assist  Trunk/Postural Assessment  Cervical Assessment Cervical Assessment: Within Functional Limits Thoracic Assessment Thoracic Assessment: Within Functional Limits Lumbar Assessment Lumbar Assessment: Exceptions to [facility] (posterior pelvic tilt) Postural Control Postural Control: Deficits on evaluation Righting Reactions: delayed Protective Responses: delayed   Balance Static Standing Balance Static Standing - Level of Assistance: 4: Min assist Dynamic Standing Balance Dynamic Standing - Level of Assistance: 3: Mod assist;2: Max assist Extremity/Trunk Assessment RUE Assessment RUE Assessment: Within Functional Limits LUE Assessment LUE Assessment: Exceptions to WFL (decr coorination, slower movements)  FIM:  FIM - Eating Eating Activity: 5: Set-up assist for open containers FIM - Bathing Bathing Steps Patient Completed:  Chest;Right Arm;Left Arm;Abdomen;Front perineal area;Right upper leg;Left upper leg Bathing: 3: Mod-Patient completes 5-7 66f 10 parts or 50-74% FIM - Upper Body Dressing/Undressing Upper body dressing/undressing steps patient completed: Thread/unthread right sleeve of pullover shirt/dresss;Thread/unthread left sleeve of pullover shirt/dress;Put head through opening of pull over shirt/dress;Pull shirt over trunk Upper body dressing/undressing: 5: Set-up assist to: Obtain clothing/put away FIM - Lower Body Dressing/Undressing Lower body dressing/undressing steps patient completed: Thread/unthread left pants leg;Thread/unthread right pants leg;Don/Doff left sock;Don/Doff left shoe Lower body dressing/undressing: 2: Max-Patient completed 25-49% of tasks FIM - Toileting Toileting: 1: Total-Patient completed zero steps, helper did all 3 FIM - Bed/Chair Transfer Bed/Chair Transfer: 3: Supine > Sit: Mod A (lifting assist/Pt. 50-74%/lift 2 legs;3: Sit > Supine: Mod A (lifting assist/Pt. 50-74%/lift 2 legs);3: Bed > Chair or  W/C: Mod A (lift or lower assist);3: Chair or W/C > Bed: Mod A (lift or lower assist) FIM - Toilet Transfers Toilet Transfers: 3-To toilet/BSC: Mod A (lift or lower assist);3-From toilet/BSC: Mod A (lift or lower assist) FIM - Secretary/administrator Devices: Shower chair;Grab bars Tub/shower Transfers: 3-Into Tub/Shower: Mod A (lift or lower/lift 2 legs);3-Out of Tub/Shower: Mod A (lift or lower/lift 2 legs)   Refer to Care Plan for Long Term Goals  Recommendations for other services: Neuropsych  Discharge Criteria: Patient will be discharged from OT if patient refuses treatment 3 consecutive times without medical reason, if treatment goals not met, if there is a change in medical status, if patient makes no progress towards goals or if patient is discharged from hospital.  The above assessment, treatment plan, treatment alternatives and goals were discussed and  mutually agreed upon: by patient  1:1 OT eval initiated with OT goals, purpose and goals reviewed. Self care retraining at shower level including toilet transfer, short distance functional ambulation with RW to shower (3:1), functional standing turns, sequencing, initiation , task organization, orientation information, d/c planning, sit to stands, standing balance, awareness of proper placement of feet (left >right) for transfers and activity tolerance. PA gave permission to shower and have RN re wrap foot afterwards. After attempt on toilet pt was incontinent of bowel in shower unknowingly.   Roney Mans [facility] 08/30/2012, 10:48 AM

## 2012-08-30 NOTE — Progress Notes (Signed)
Social Work Assessment and Plan Social Work Assessment and Plan  Patient Details  Name: Thomas Frye MRN: [id number] Date of Birth: 10-19-1955  Today's Date: 08/30/2012  Problem List:  Patient Active Problem List   Diagnosis Date Noted  . Anoxic encephalopathy 08/30/2012  . ? 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/2012  . 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 Acute respiratory failure in setting of profound hypoglycemia 08/18/2012  . Severe diabetic hypoglycemia 08/18/2012  . DM (diabetes mellitus), type 2 with peripheral vascular complications 08/18/2012  . Acute encephalopathy 08/18/2012  . HTN (hypertension) 08/18/2012  . Noncompliance 08/18/2012   Past Medical History:  Past Medical History  Diagnosis Date  . Diabetes mellitus   . Hypertension   . Depression   . GERD (gastroesophageal reflux disease)   . H/O hiatal hernia   . Neuromuscular disorder     neuropathy  . Sleep apnea     wears cpap at night   Past Surgical History:  Past Surgical History  Procedure Laterality Date  . Toe amputation    . Eye surgery    . No past surgeries    . Fracture surgery      Questionable steel screws to right tibia   Social History:  reports that he has never smoked. He does not have any smokeless tobacco history on file. He reports that he does not use illicit drugs. His alcohol history is not on file.  Family / Support Systems Marital Status: Married How Long?: 21 yrs (had been separated x 8 mos PTA - not a legal separation) Patient Roles: Spouse;Parent Spouse/Significant Other: wife, Dmoni Fortson @ [phone] or [phone] Children: pt and wife have 4 children - youngest, 55 y.o. daughter only child still at home - 3 adult sons with two living locally Other Supports: extended family:  pt had been staying with his parents during the separation (8mos) and wife/ daughter had been staying with her brother. Anticipated  Caregiver: Wife: Jaidon Ellery, other family members to assist (4 adult children. 3 are local. ) Ability/Limitations of Caregiver: none Caregiver Availability: 24/7 (Wife works but will arrange 24/7 assist as needed.) Family Dynamics: wife very committed to providing any assistance necessary    Social History Preferred language: English Religion: Baptist Cultural Background: NA Education: BS in Business Admin Read: Yes Write: Yes Employment Status: Employed Name of Employer: [employer] 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  Smiling throughout interview. Denies any s/s of depression or anxiety currently.  (is followed for Depression as outpatient) - is currently on Lexapro. Recent Psychosocial Issues: Marital separation approx 8 months ago.  Pt reports that they have reconciled since his hospitalization Pyschiatric History: Pt reports that he is followed for counseling by [facility] in [place].  Frequency can be as often as weekly or fewer times dependent "...on what I need at the time" Substance Abuse History: Pt denies.  Patient / Family Perceptions, Expectations & Goals Pt/Family understanding of illness & functional limitations: pt reports "my blood sugar got too low and I went out" - basic understanding of reasons he is here to address physical and cognitive needs. Premorbid  pt/family roles/activities: Pt was fully independent and working ful-time.  Wife also working ft Anticipated changes in roles/activities/participation: wife and other family will likely need to provide 24/7  supervision at a minimum.   Pt/family expectations/goals: Pt plans to return home with his wife and daughter - wife currently looking for a new home.  Community Resources [employer]: None Premorbid Home Care/DME Agencies: None Transportation available at discharge: yes Resource referrals recommended: Neuropsychology;Support group (specify)  Discharge Planning Living Arrangements: Spouse/significant other Support Systems: Spouse/significant other;Children;Parent;Other relatives;Friends/neighbors Type of Residence: Private residence Insurance Resources: Media planner (specify) (BCBS of [place]) Financial Resources: Employment Financial Screen Referred: No Living Expenses: Other (Comment) (in process of finding a new home) Money Management: Patient;Spouse Do you have any problems obtaining your medications?: No Home Management: both  Patient/Family Preliminary Plans: Pt plans to return home with his wife and 8 y.o. daughter in new home location. Barriers to Discharge: Other (Comment) (new home not yet secured) Social Work Anticipated Follow Up Needs: HH/OP;Support Group Expected length of stay: 3 weeks  Clinical Impression Pt presents better than I had anticipated given diagnosis.  Able to complete psychosocial interview, however, slowed speech.  Pt speaks affectionately about wife despite their separation x 8 mos PTA.  Will speak further with wife, however, she is very committed to providing any needed assistance at d/c.  Carolee Channell 08/30/2012, 1:42 PM

## 2012-08-30 NOTE — Progress Notes (Signed)
Physical Therapy Note  Patient Details  Name: Thomas Frye MRN: [id number] Date of Birth: 1956-03-01 Today's Date: 08/30/2012  Time: 1100-1126 26 minutes  1: 1 No c/o pain, pt fatigued from previous OT session.  Session focused on increasing activity tolerance.  W/c mobility 150' x 2 with 1 rest break each time.  Nu step x 7 minutes with divided attention task of answering family and biographical info.  Pt slowed cadence but able to continue pedaling while maintaining conversation.   DONAWERTH,KAREN 08/30/2012, 3:22 PM

## 2012-08-30 NOTE — Progress Notes (Signed)
Subjective/Complaints: Slept well. No complaints. Denies pain A 12 point review of systems has been performed and if not noted above is otherwise negative.   Objective: Vital Signs: Blood pressure 132/67, pulse 80, temperature 98.6 F (37 C), temperature source Oral, resp. rate 17, height 6\' 4"  (1.93 m), weight 129.2 kg (284 lb 13.4 oz), SpO2 94.00%. No results found.  Recent Labs  08/29/12 0515 08/30/12 0535  WBC 12.6* 12.2*  HGB 11.0* 11.2*  HCT 31.8* 32.8*  PLT 267 259    Recent Labs  08/29/12 0515 08/30/12 0535  NA 138 138  K 3.7 3.8  CL 101 100  GLUCOSE 176* 175*  BUN 13 11  CREATININE 0.71 0.77  CALCIUM 9.4 9.2   CBG (last 3)   Recent Labs  08/29/12 2004 08/29/12 2352 08/30/12 0404  GLUCAP 238* 172* 156*    Wt Readings from Last 3 Encounters:  08/29/12 129.2 kg (284 lb 13.4 oz)  08/22/12 126.4 kg (278 lb 10.6 oz)  12/10/10 129.729 kg (286 lb)    Physical Exam:  Constitutional: He appears well-developed and well-nourished.  HENT:  Head: Normocephalic and atraumatic.  Right Ear: External ear normal.  Left Ear: External ear normal.  Eyes: Conjunctivae and EOM are normal. Pupils are equal, round, and reactive to light.  Neck: Normal range of motion. No JVD present. No tracheal deviation present. No thyromegaly present.  Cardiovascular: Normal rate and regular rhythm.  Pulmonary/Chest: Effort normal and breath sounds normal. No respiratory distress. He has no wheezes. He has no rales.  Abdominal: Soft. Bowel sounds are normal. He exhibits no distension. There is no tenderness.  Musculoskeletal: He exhibits no edema.  Tends to keep RLE externally rotated. 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 Right lateral foot with 2 cm stage II ulcer along 5th MT--clean. Left toes with loss of toe nails.  Lymphadenopathy:  He has no cervical adenopathy.  Neurological: He is alert. He displays abnormal reflex. Coordination abnormal.  Flat  affect but no signs of distress. Makes eye contact. Slow measured speech with hesitations and expressive deficits. Decreased initiation and delayed processing. Able to follow simple one step commands but occasional perseveration noted. Oriented to self and to rehab hospital. Remembered i was his doctor. Unable to state age, DOB and not aware of current time frame. Cannot name president. Unable to do simple math or state months of the year without cues. Mild left inattention? Poor awareness of deficits. UES grossly 4/5. LE HF is 2-3/5 and is grossly 4/5 distally. RLE limited by wound/boot. Stocking glove sensory loss below the knees.  Skin: Skin is warm and dry.  Psychiatric: Judgment and thought content normal. His affect is blunt. His speech is delayed. He is slowed. He exhibits abnormal recent memory and abnormal remote memory   Assessment/Plan: 1. Functional deficits secondary to anoxic which require 3+ hours per day of interdisciplinary therapy in a comprehensive inpatient rehab setting. Physiatrist is providing close team supervision and 24 hour management of active medical problems listed below. Physiatrist and rehab team continue to assess barriers to discharge/monitor patient progress toward functional and medical goals. FIM:                   Comprehension Comprehension Mode: Auditory Comprehension: 4-Understands basic 75 - 89% of the time/requires cueing 10 - 24% of the time  Expression Expression Mode: Verbal Expression: 2-Expresses basic 25 - 49% of the time/requires cueing 50 - 75% of the time. Uses single  words/gestures.  Social Interaction Social Interaction: 4-Interacts appropriately 75 - 89% of the time - Needs redirection for appropriate language or to initiate interaction.        Medical Problem List and Plan:  1. DVT Prophylaxis/Anticoagulation: Pharmaceutical: Lovenox  2. Pain Management: N/A  3. Mood: No signs of distress noted and patient expressing need  to get better.Will have LCSW and psychologist follow up for evaluation and support. Provide ego support. Continue lexapro  4. Neuropsych: This patient is not capable of making decisions on his own behalf.  5. DM type 2: BS improving with levemir titrated to home dose. Po intake remains poor. Monitor BS with AC./HS cbg checks. Continue levemir with novolog for meal coverage.  6. Chronic neurogenic right foot ulcer: Pressure relief measures when in bed--CAM boot out of bed. Continue Cipro and doxycycline indefinitely due to chronic infection per wife. Plans for graft in the future.  7. Urinary retention: Continue foley for now---monitor mobility levels. UA positive---will await culture. Continue Flomax.  8. Bowel incontinence: May need probiotic. Will monitor and tract to see if bowel program needed.  LOS (Days) 1 A FACE TO FACE EVALUATION WAS PERFORMED  SWARTZ,ZACHARY T 08/30/2012 7:23 AM

## 2012-08-30 NOTE — Evaluation (Signed)
Physical Therapy Assessment and Plan  Patient Details  Name: Thomas Frye MRN: [id number] Date of Birth: November 29, 1955  PT Diagnosis: Abnormality of gait, Cognitive deficits, Difficulty walking, Hemiplegia dominant and Muscle weakness Rehab Potential: Good ELOS: 2-3 weeks   Today's Date: 08/30/2012 Time: 800-900 60 minutes Problem List:  Patient Active Problem List   Diagnosis Date Noted  . Anoxic encephalopathy 08/30/2012  . ? 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/2012  . 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 Acute respiratory failure in setting of profound hypoglycemia 08/18/2012  . Severe diabetic hypoglycemia 08/18/2012  . DM (diabetes mellitus), type 2 with peripheral vascular complications 08/18/2012  . Acute encephalopathy 08/18/2012  . HTN (hypertension) 08/18/2012  . Noncompliance 08/18/2012    Past Medical History:  Past Medical History  Diagnosis Date  . Diabetes mellitus   . Hypertension   . Depression   . GERD (gastroesophageal reflux disease)   . H/O hiatal hernia   . Neuromuscular disorder     neuropathy  . Sleep apnea     wears cpap at night   Past Surgical History:  Past Surgical History  Procedure Laterality Date  . Toe amputation    . Eye surgery    . No past surgeries    . Fracture surgery      Questionable steel screws to right tibia    Assessment & Plan Clinical Impression: Patient is a 57 y.o. year old male  found in a parked care on 08/18/12. He was unresponsive with agonal respirations, posturing and profoundly hypoglycemic. He was intubated in the field and treated with dextrose but remained minimally responsive. 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 Neurology consulted and felt patient with severe hypoglycemic encephalopathy with question of seizure activity. He was started on IV antibiotics due to cellulitis and question of aspiration PNA. He was extubated on 05/27 and psychiatry  consulted due to concerns of insulin overdose/suicide attempt. He felt that patient with poor insight, judgement and impulse control and initially recommended inpatient acute psychiatric hospitalization once medically cleared. Mentation slowly improving with decrease in lethargy and improvement in ability to follow basic commands and sustain attention to tasks. He was cleared by psychiatry to resume antidepressants as no SI noted reported. Neurology recommended ASA and statin due to findings of old infarct on CCT.  Patient transferred to CIR on 08/29/2012 .   Patient currently requires mod with mobility secondary to muscle weakness, impaired timing and sequencing, decreased coordination and decreased motor planning, decreased initiation, decreased attention, decreased awareness, decreased problem solving, decreased safety awareness and delayed processing and decreased standing balance, hemiplegia and decreased balance strategies.  Prior to hospitalization, patient was independent  with mobility and lived with Spouse;Family in a House home.    Patient will benefit from skilled PT intervention to maximize safe functional mobility, minimize fall risk and decrease caregiver burden for planned discharge home with 24 hour supervision.  Anticipate patient will benefit from follow up OP at discharge.  PT - End of Session Activity Tolerance: Tolerates 30+ min activity with multiple rests Endurance Deficit: Yes PT Assessment Rehab Potential: Good PT Plan PT Intensity: Minimum of 1-2 x/day ,45 to 90 minutes PT Frequency: 5 out of 7 days PT Duration Estimated Length of Stay: 2-3 weeks PT Treatment/Interventions: Ambulation/gait training;Balance/vestibular training;Cognitive remediation/compensation;Community reintegration;Functional electrical stimulation;Patient/family education;Stair training;Splinting/orthotics;Pain management;DME/adaptive equipment instruction;Neuromuscular re-education;Discharge  planning;Functional mobility training;Wheelchair propulsion/positioning;Therapeutic Exercise;Therapeutic Activities;UE/LE Strength taining/ROM;UE/LE Coordination activities PT Recommendation Recommendations for Other Services: Neuropsych  consult Follow Up Recommendations: 24 hour supervision/assistance;Outpatient PT Patient destination: Home Equipment Recommended: Rolling walker with 5" wheels  Skilled Therapeutic Intervention Gait training with RW with mod A 10' x 2, pt fatigues easily with gait, states his legs get tired.  Pt with delayed responses and slow processing.  Standing balance task reaching for horseshoes with mod A with 1 UE support, pt able to follow up to 3 step commands with increased time.   PT Evaluation Precautions/Restrictions Precautions Precautions: Fall Required Braces or Orthoses: Other Brace/Splint Other Brace/Splint: cam boot R ankle OOB Restrictions Weight Bearing Restrictions: No Other Position/Activity Restrictions: Has blue PRAFO for in the bed.   Pain Pain Assessment Pain Assessment: No/denies pain Home Living/Prior Functioning Home Living Lives With: Spouse;Family Available Help at Discharge: Family Type of Home: House Additional Comments: Pt and wife state they plan to move before pt's d/c in order to find a home with 1 level and level entry Prior Function Level of Independence: Independent with basic ADLs;Independent with transfers;Independent with gait Able to Take Stairs?: Yes Driving: Yes Vocation: Full time employment  Cognition Overall Cognitive Status: Impaired/Different from baseline Arousal/Alertness: Awake/alert Orientation Level: Oriented to person;Oriented to place;Disoriented to situation;Oriented to time Attention: Sustained Focused Attention: Appears intact Sustained Attention: Impaired Sustained Attention Impairment: Functional basic Memory: Impaired Memory Impairment: Decreased recall of new information;Decreased short term  memory Awareness: Impaired Awareness Impairment: Intellectual impairment Problem Solving: Impaired Problem Solving Impairment: Functional basic Executive Function: Initiating Initiating: Impaired Initiating Impairment: Functional basic Safety/Judgment: Impaired Sensation Sensation Light Touch: Impaired Detail Light Touch Impaired Details: Impaired LLE;Impaired RLE Proprioception: Impaired Detail Proprioception Impaired Details: Impaired LLE Coordination Gross Motor Movements are Fluid and Coordinated: No Fine Motor Movements are Fluid and Coordinated: No Coordination and Movement Description: decreased L UE/LE coordination Motor  Motor Motor: Abnormal postural alignment and control Motor - Skilled Clinical Observations: L foot drop  Mobility Bed Mobility Supine to Sit: 5: Supervision Transfers Sit to Stand: 4: Min assist Stand to Sit: 4: Min assist;To chair/3-in-1;With upper extremity assist Stand Pivot Transfers: 3: Mod assist Stand Pivot Transfer Details (indicate cue type and reason): manual facilitation for wt shift, cues for motor planning and sequencing Locomotion  Ambulation Ambulation: Yes Ambulation/Gait Assistance: 1: +2 Total assist Ambulation Distance (Feet): 10 Feet Assistive device: 2 person hand held assist Ambulation/Gait Assistance Details: assist for wt shifting, steadying assist for balance throughout, pt with decreased step length, L foot drop which he compensates for by increased L knee flexion causing LOB Stairs / Additional Locomotion Stairs: Yes Stairs Assistance: 4: Min assist Stair Management Technique: Two rails Number of Stairs: 5 Naval architect Assistance: 5: Investment banker, operational: Both upper extremities Wheelchair Parts Management: Needs assistance Distance: 50  Trunk/Postural Assessment  Cervical Assessment Cervical Assessment: Within Functional Limits Thoracic Assessment Thoracic Assessment: Within  Functional Limits Lumbar Assessment Lumbar Assessment:  (posterior tilt) Postural Control Postural Control: Deficits on evaluation Righting Reactions: delayed Protective Responses: delayed  Holiday representative Standing - Balance Support: During functional activity Static Standing - Level of Assistance: 4: Min assist Dynamic Standing Balance Dynamic Standing - Balance Support: Bilateral upper extremity supported;During functional activity Dynamic Standing - Level of Assistance: 3: Mod assist Extremity Assessment  RUE Assessment RUE Assessment: Within Functional Limits LUE Assessment LUE Assessment: Exceptions to [facility] (decr coorination, slower movements) RLE Assessment RLE Assessment: Within Functional Limits (R ankle in cam boot) LLE Assessment LLE Assessment:  (hip and knee 3-/5, ankle 1/5)  FIM:  FIM -  Bed/Chair Transfer Bed/Chair Transfer: 3: Supine > Sit: Mod A (lifting assist/Pt. 50-74%/lift 2 legs;3: Sit > Supine: Mod A (lifting assist/Pt. 50-74%/lift 2 legs);3: Bed > Chair or W/C: Mod A (lift or lower assist);3: Chair or W/C > Bed: Mod A (lift or lower assist) FIM - Locomotion: Wheelchair Distance: 50 FIM - Locomotion: Ambulation Ambulation/Gait Assistance: 1: +2 Total assist Locomotion: Ambulation: 1: Two helpers FIM - Locomotion: Stairs Locomotion: Stairs: 1: Up and Down < 4 stairs with minimal assistance (Pt.>75%)   Refer to Care Plan for Long Term Goals  Recommendations for other services: Neuropsych  Discharge Criteria: Patient will be discharged from PT if patient refuses treatment 3 consecutive times without medical reason, if treatment goals not met, if there is a change in medical status, if patient makes no progress towards goals or if patient is discharged from hospital.  The above assessment, treatment plan, treatment alternatives and goals were discussed and mutually agreed upon: by patient  [facility] 08/30/2012, 12:24 PM

## 2012-08-30 NOTE — Plan of Care (Addendum)
Overall Plan of Care [facility]) Patient Details Name: Thomas Frye MRN: [id number] DOB: 06-18-1955  Diagnosis:  Anoxic BI  Co-morbidities: dm2, chronic diabetic foot ulcer. Peripheral neuropathy d/t dm, urine retention  Functional Problem List  Patient demonstrates impairments in the following areas: Balance, Behavior, Bladder, Bowel, Cognition, Edema, Endurance, Linguistic, Medication Management, Motor, Nutrition, Pain, Perception, Safety, Sensory  and Skin Integrity  Basic ADL's: eating, grooming, bathing, dressing and toileting Advanced ADL's: simple meal preparation  Transfers:  bed mobility, bed to chair, toilet, tub/shower, car and furniture Locomotion:  ambulation, wheelchair mobility and stairs  Additional Impairments:  Functional use of upper extremity, Communication  comprehension and expression, Social Cognition   social interaction, problem solving, memory, attention and awareness, Leisure Awareness and Discharge Disposition  Anticipated Outcomes Item Anticipated Outcome  Eating/Swallowing    Basic self-care  supervision  Tolieting  supervision  Bowel/Bladder  Mod I  Transfers  supervision  Locomotion  supervision  Communication  Supervision  Cognition  Supervision  Pain  Mod I  Safety/Judgment  supervision  Other     Therapy Plan: PT Intensity: Minimum of 1-2 x/day ,45 to 90 minutes PT Frequency: 5 out of 7 days PT Duration Estimated Length of Stay: 2-3 weeks OT Intensity: Minimum of 1-2 x/day, 45 to 90 minutes OT Frequency: 5 out of 7 days OT Duration/Estimated Length of Stay: 3 weeks SLP Intensity: Minumum of 1-2 x/day, 30 to 90 minutes SLP Frequency: 5 out of 7 days SLP Duration/Estimated Length of Stay: 2-3 weeks    Team Interventions: Item RN PT OT SLP SW TR Other  Self Care/Advanced ADL Retraining   x      Neuromuscular Re-Education  x x      Therapeutic Activities  x x x  x   UE/LE Strength Training/ROM  x x      UE/LE Coordination  Activities  x x      Visual/Perceptual Remediation/Compensation         DME/Adaptive Equipment Instruction  x x   x   Therapeutic Exercise  x x   x   Balance/Vestibular Training x x x   x   Patient/Family Education x x x x  x   Cognitive Remediation/Compensation x x x x  x   Functional Mobility Training  x x   x   Ambulation/Gait Training  x       Stair Training  x       Wheelchair Propulsion/Positioning  x       Functional Statistician  x       Community Reintegration  x x   x   Dysphagia/Aspiration Landscape architect Facilitation    x     Bladder Management x        Bowel Management x        Disease Management/Prevention x        Pain Management x x       Medication Management x        Skin Care/Wound Management x        Splinting/Orthotics x x x      Discharge Planning x x x x  x   Psychosocial Support x  x x  x                          Team Discharge Planning: Destination: PT-Home ,OT- Home , SLP-Home Projected Follow-up: PT-24 hour supervision/assistance;Outpatient  PT, OT-  Outpatient OT, SLP-Outpatient SLP;24 hour supervision/assistance Projected Equipment Needs: PT-Rolling walker with 5" wheels, OT-  , SLP-None recommended by SLP Patient/family involved in discharge planning: PT- Patient,  OT-Patient, SLP-Patient  MD ELOS: 2-3 weeks Medical Rehab Prognosis:  Excellent Assessment: The patient has been admitted for CIR therapies. The team will be addressing, functional mobility, strength, stamina, balance, safety, adaptive techniques/equipment, self-care, bowel and bladder mgt, patient and caregiver education, NMR, cognitive and visual perceptual rx, cognition, communication, skin care, pain mgt. Goals have been set at supervision across disciplines.    Ranelle Oyster, MD, FAAPMR      See Team Conference Notes for weekly updates to the plan of care

## 2012-08-31 ENCOUNTER — Inpatient Hospital Stay (HOSPITAL_COMMUNITY): Payer: BC Managed Care – PPO | Admitting: Speech Pathology

## 2012-08-31 ENCOUNTER — Inpatient Hospital Stay (HOSPITAL_COMMUNITY): Payer: BC Managed Care – PPO

## 2012-08-31 ENCOUNTER — Encounter (HOSPITAL_COMMUNITY): Payer: BC Managed Care – PPO | Admitting: Occupational Therapy

## 2012-08-31 ENCOUNTER — Inpatient Hospital Stay (HOSPITAL_COMMUNITY): Payer: BC Managed Care – PPO | Admitting: Physical Therapy

## 2012-08-31 DIAGNOSIS — E1165 Type 2 diabetes mellitus with hyperglycemia: Secondary | ICD-10-CM

## 2012-08-31 DIAGNOSIS — IMO0001 Reserved for inherently not codable concepts without codable children: Secondary | ICD-10-CM

## 2012-08-31 DIAGNOSIS — E1142 Type 2 diabetes mellitus with diabetic polyneuropathy: Secondary | ICD-10-CM

## 2012-08-31 DIAGNOSIS — G931 Anoxic brain damage, not elsewhere classified: Secondary | ICD-10-CM

## 2012-08-31 LAB — GLUCOSE, CAPILLARY
Glucose-Capillary: 170 mg/dL — ABNORMAL HIGH (ref 70–99)
Glucose-Capillary: 314 mg/dL — ABNORMAL HIGH (ref 70–99)
Glucose-Capillary: 170 mg/dL — ABNORMAL HIGH (ref 70–99)

## 2012-08-31 MED ORDER — INSULIN DETEMIR 100 UNIT/ML ~~LOC~~ SOLN
10.0000 [IU] | Freq: Every day | SUBCUTANEOUS | Status: DC
  Administered 2012-08-31 – 2012-09-01 (×2): 10 [IU] via SUBCUTANEOUS
  Filled 2012-08-31 (×4): qty 0.1

## 2012-08-31 MED ORDER — INSULIN ASPART 100 UNIT/ML ~~LOC~~ SOLN
0.0000 [IU] | Freq: Three times a day (TID) | SUBCUTANEOUS | Status: DC
  Administered 2012-08-31: 15 [IU] via SUBCUTANEOUS
  Administered 2012-09-01: 3 [IU] via SUBCUTANEOUS
  Administered 2012-09-01: 5 [IU] via SUBCUTANEOUS
  Administered 2012-09-01 – 2012-09-02 (×3): 8 [IU] via SUBCUTANEOUS
  Administered 2012-09-02: 11 [IU] via SUBCUTANEOUS
  Administered 2012-09-03: 5 [IU] via SUBCUTANEOUS

## 2012-08-31 MED ORDER — MEGESTROL ACETATE 400 MG/10ML PO SUSP
400.0000 mg | Freq: Two times a day (BID) | ORAL | Status: DC
  Filled 2012-08-31 (×9): qty 10

## 2012-08-31 MED ORDER — INSULIN ASPART 100 UNIT/ML ~~LOC~~ SOLN
5.0000 [IU] | SUBCUTANEOUS | Status: AC
  Administered 2012-08-31: 5 [IU] via SUBCUTANEOUS

## 2012-08-31 MED ORDER — INSULIN ASPART 100 UNIT/ML ~~LOC~~ SOLN
0.0000 [IU] | Freq: Every day | SUBCUTANEOUS | Status: DC
  Administered 2012-09-02: 3 [IU] via SUBCUTANEOUS

## 2012-08-31 NOTE — Progress Notes (Signed)
Social Work Patient ID: [name], male   DOB: October 17, 1955, 57 y.o.   MRN: [id number] Message left by wife regarding length of stay and trying to move into another apartment.  Spoke with Karen-PT and Courtney-SPT who feels he will be here Two weeks.  Contacted wife back and left message for her regarding this information.  Wife to meet with Lucy on Monday.

## 2012-08-31 NOTE — Progress Notes (Signed)
Inpatient Diabetes Program Recommendations  AACE/ADA: New Consensus Statement on Inpatient Glycemic Control (2013)  Target Ranges:  Prepandial:   less than 140 mg/dL      Peak postprandial:   less than 180 mg/dL (1-2 hours)      Critically ill patients:  140 - 180 mg/dL   Inpatient Diabetes Program Recommendations Correction (SSI): Change Novolog Correction scale to TID + HS scale  Thank you  Piedad Climes BSN, RN,CDE Inpatient Diabetes Coordinator [phone] (team pager)

## 2012-08-31 NOTE — Progress Notes (Signed)
Pt's blood sugar is 408 per glucometer, stat order for confirmation by lab processed and Dr Wynn Banker paged.

## 2012-08-31 NOTE — Progress Notes (Signed)
Physical Therapy Note  Patient Details  Name: Thomas Frye MRN: [id number] Date of Birth: 1955/05/03 Today's Date: 08/31/2012  Time: 800-855 55 minutes  1:1 No c/o pain.  Gait training with RW 50' x 2 with min-mod A, pt without LOB today, improved activity tolerance with gait.  Standing balance with 1 UE support for reaching and horseshoe toss with min A, pt able to steady himself with 1 UE support.  Standing ball toss without UE support with min A, needing mod-max A to correct several LOB without UE support.  Newman Pies toss with cognitive naming task naming baseball teams with each letter, pt requires mod-max questioning and phonemic cues to complete.  Attempted seated ball toss with naming task, pt continues to require mod-max cuing for language and cognition task. W/c mobility with supervision 150'.   DONAWERTH,KAREN 08/31/2012, 8:56 AM

## 2012-08-31 NOTE — Progress Notes (Signed)
Subjective/Complaints: Slept well. Comments on his poor appetite A 12 point review of systems has been performed and if not noted above is otherwise negative.   Objective: Vital Signs: Blood pressure 151/84, pulse 82, temperature 98.3 F (36.8 C), temperature source Oral, resp. rate 19, height 6\' 4"  (1.93 m), weight 129.2 kg (284 lb 13.4 oz), SpO2 95.00%. No results found.  Recent Labs  08/29/12 0515 08/30/12 0535  WBC 12.6* 12.2*  HGB 11.0* 11.2*  HCT 31.8* 32.8*  PLT 267 259    Recent Labs  08/29/12 0515 08/30/12 0535  NA 138 138  K 3.7 3.8  CL 101 100  GLUCOSE 176* 175*  BUN 13 11  CREATININE 0.71 0.77  CALCIUM 9.4 9.2   CBG (last 3)   Recent Labs  08/30/12 2037 08/31/12 0025 08/31/12 0423  GLUCAP 226* 206* 153*    Wt Readings from Last 3 Encounters:  08/29/12 129.2 kg (284 lb 13.4 oz)  08/22/12 126.4 kg (278 lb 10.6 oz)  12/10/10 129.729 kg (286 lb)    Physical Exam:  Constitutional: He appears well-developed and well-nourished.  HENT:  Head: Normocephalic and atraumatic.  Right Ear: External ear normal.  Left Ear: External ear normal.  Eyes: Conjunctivae and EOM are normal. Pupils are equal, round, and reactive to light.  Neck: Normal range of motion. No JVD present. No tracheal deviation present. No thyromegaly present.  Cardiovascular: Normal rate and regular rhythm.  Pulmonary/Chest: Effort normal and breath sounds normal. No respiratory distress. He has no wheezes. He has no rales.  Abdominal: Soft. Bowel sounds are normal. He exhibits no distension. There is no tenderness.  Musculoskeletal: He exhibits no edema.  Tends to keep RLE externally rotated. 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 Right lateral foot with 2 cm stage II ulcer along 5th MT--clean. Left toes with loss of toe nails.  Lymphadenopathy:  He has no cervical adenopathy.  Neurological: He is alert. He displays abnormal reflex. Coordination abnormal.  Flat  affect but no signs of distress. Makes eye contact. Slow measured speech with hesitations and expressive deficits. Decreased initiation and delayed processing. Able to follow simple one step commands but occasional perseveration noted. Oriented to self and to rehab hospital. Remembered i was his doctor but not my name.  Unable to do simple math or state months of the year without cues. Mild left inattention? Poor awareness of deficits. UES grossly 4/5. LE HF is 2-3/5 and is grossly 4/5 distally. RLE limited by wound/boot. Stocking glove sensory loss below the knees.  Skin: Skin is warm and dry.  Psychiatric: Judgment and thought content normal. His affect is blunt. His speech is delayed. He is slowed. He exhibits abnormal recent memory and abnormal remote memory   Assessment/Plan: 1. Functional deficits secondary to anoxic which require 3+ hours per day of interdisciplinary therapy in a comprehensive inpatient rehab setting. Physiatrist is providing close team supervision and 24 hour management of active medical problems listed below. Physiatrist and rehab team continue to assess barriers to discharge/monitor patient progress toward functional and medical goals. FIM: FIM - Bathing Bathing Steps Patient Completed: Chest;Right Arm;Left Arm;Abdomen;Front perineal area;Right upper leg;Left upper leg Bathing: 3: Mod-Patient completes 5-7 61f 10 parts or 50-74%  FIM - Upper Body Dressing/Undressing Upper body dressing/undressing steps patient completed: Thread/unthread right sleeve of pullover shirt/dresss;Thread/unthread left sleeve of pullover shirt/dress;Put head through opening of pull over shirt/dress;Pull shirt over trunk Upper body dressing/undressing: 5: Set-up assist to: Obtain clothing/put away FIM -  Lower Body Dressing/Undressing Lower body dressing/undressing steps patient completed: Thread/unthread left pants leg;Thread/unthread right pants leg;Don/Doff left sock;Don/Doff left shoe Lower  body dressing/undressing: 2: Max-Patient completed 25-49% of tasks  FIM - Toileting Toileting: 1: Total-Patient completed zero steps, helper did all 3  FIM - Toilet Transfers Toilet Transfers: 3-To toilet/BSC: Mod A (lift or lower assist);3-From toilet/BSC: Mod A (lift or lower assist)  FIM - Bed/Chair Transfer Bed/Chair Transfer: 3: Supine > Sit: Mod A (lifting assist/Pt. 50-74%/lift 2 legs;3: Sit > Supine: Mod A (lifting assist/Pt. 50-74%/lift 2 legs);3: Bed > Chair or W/C: Mod A (lift or lower assist);3: Chair or W/C > Bed: Mod A (lift or lower assist)  FIM - Locomotion: Wheelchair Distance: 50 FIM - Locomotion: Ambulation Ambulation/Gait Assistance: 1: +2 Total assist Locomotion: Ambulation: 1: Two helpers  Comprehension Comprehension Mode: Auditory Comprehension: 4-Understands basic 75 - 89% of the time/requires cueing 10 - 24% of the time  Expression Expression Mode: Verbal Expression: 3-Expresses basic 50 - 74% of the time/requires cueing 25 - 50% of the time. Needs to repeat parts of sentences.  Social Interaction Social Interaction: 5-Interacts appropriately 90% of the time - Needs monitoring or encouragement for participation or interaction.  Problem Solving Problem Solving: 3-Solves basic 50 - 74% of the time/requires cueing 25 - 49% of the time  Memory Memory: 2-Recognizes or recalls 25 - 49% of the time/requires cueing 51 - 75% of the time  Medical Problem List and Plan:  1. DVT Prophylaxis/Anticoagulation: Pharmaceutical: Lovenox  2. Pain Management: N/A  3. Mood: No signs of distress noted and patient expressing need to get better.Will have LCSW and psychologist follow up for evaluation and support. Provide ego support. Continue lexapro  4. Neuropsych: This patient is not capable of making decisions on his own behalf.  5. DM type 2:  . Po intake remains poor. Monitor BS with AC./HS cbg checks. Adjust levemir with novolog for meal coverage.  6. Chronic neurogenic  right foot ulcer: Pressure relief measures when in bed--CAM boot out of bed. Continue Cipro and doxycycline indefinitely due to chronic infection per wife. Plans for graft in the future? 7. Urinary retention: Has 30k Ecoli---rx with cipro x 3 days (sens pending)  8. Bowel incontinence: May need probiotic. Will monitor and tract to see if bowel program needed. 9. FEN: poor appetite/intake  -RD follow up  -megace trial  LOS (Days) 2 A FACE TO FACE EVALUATION WAS PERFORMED  Phala Schraeder T 08/31/2012 7:13 AM

## 2012-08-31 NOTE — Progress Notes (Signed)
Speech Language Pathology Daily Session Note  Patient Details  Name: Thomas Frye MRN: [id number] Date of Birth: 06-Dec-1955  Today's Date: 08/31/2012 Time: 7829-5621 Time Calculation (min): 45 min  Short Term Goals: Week 1: SLP Short Term Goal 1 (Week 1): Pt will demonstrate sustained attention to task for 30 miutes with supervision verbal cues for redirection SLP Short Term Goal 2 (Week 1): Pt will demonstrate functional problem solving with basic and familiar tasks with Min A verbal and question cues.  SLP Short Term Goal 3 (Week 1): Pt will identify 1 physical and 1 cognitive deficit with Min A semantic and question cues.  SLP Short Term Goal 4 (Week 1): Pt will utilize external memory aids to increase recall/carryover of newly learned information with Min A verbal and question cues.  SLP Short Term Goal 5 (Week 1): Pt will express wants/needs at the sentence level with Min A question cues to self-monitor and correct errors.   Skilled Therapeutic Interventions: Treatment focus on cognitive goals. SLP facilitated session by providing Max A visual, verbal and question cues for functional problem solving with basic money management tasks of counting and making change.  Pt also required Mod visual and verbal cues for functional problem solving with basic math word problems. Pt demonstrated appropriate emergent awareness in regards to difficulty of task. Pt required supervision question cues to self-monitor and correct verbal errors throughout the session.    FIM:  Comprehension Comprehension Mode: Auditory Comprehension: 4-Understands basic 75 - 89% of the time/requires cueing 10 - 24% of the time Expression Expression Mode: Verbal Expression: 4-Expresses basic 75 - 89% of the time/requires cueing 10 - 24% of the time. Needs helper to occlude trach/needs to repeat words. Social Interaction Social Interaction: 5-Interacts appropriately 90% of the time - Needs monitoring or encouragement  for participation or interaction. Problem Solving Problem Solving: 3-Solves basic 50 - 74% of the time/requires cueing 25 - 49% of the time Memory Memory: 3-Recognizes or recalls 50 - 74% of the time/requires cueing 25 - 49% of the time  Pain Pain Assessment Pain Assessment: No/denies pain  Therapy/Group: Individual Therapy  [name] 08/31/2012, 4:26 PM

## 2012-08-31 NOTE — Progress Notes (Signed)
Per Marissa Nestle, PA, give pt 15 units novalog and recheck blood sugar at 1930

## 2012-08-31 NOTE — Progress Notes (Signed)
Occupational Therapy Session Note  Patient Details  Name: Thomas Frye MRN: [id number] Date of Birth: 11/25/1955  Today's Date: 08/31/2012 Time: 1100-1205 Time Calculation (min): 65 min  Short Term Goals: Week 1:  OT Short Term Goal 1 (Week 1): Pt would transfer to toilet/ BSC with steady A consistently  OT Short Term Goal 2 (Week 1): Pt will maintain standing balance with min A for clothing management  OT Short Term Goal 3 (Week 1): Pt will pick out clothing in prep for dressing with supervision OT Short Term Goal 4 (Week 1): Pt will perform grooming with setup  OT Short Term Goal 5 (Week 1): Pt will demonstrate improved righting reactions with LOB requiring only min A to correct  balance  Skilled Therapeutic Interventions/Progress Updates:    1:1 self care retraining with focus on w/c to toilet transfer, sit to stand, standing balance with and without Ue support, short distance functional ambulation with RW (from toilet to shower stall), coordination, awareness of left foot placement with sit to stands and  Functional ambulation, anticipatory awareness, and simple problem solving. Pt perform grooming at the sink with setup  Therapy Documentation Precautions:  Precautions Precautions: Fall Required Braces or Orthoses: Other Brace/Splint Other Brace/Splint: cam boot R ankle OOB Restrictions Weight Bearing Restrictions: No Other Position/Activity Restrictions: Has blue PRAFO for in the bed.   Pain:  no c/o pain  See FIM for current functional status  Therapy/Group: Individual Therapy  Roney Mans [facility] 08/31/2012, 12:03 PM

## 2012-08-31 NOTE — Progress Notes (Signed)
Occupational Therapy Note  Patient Details  Name: Thomas Frye MRN: [id number] Date of Birth: Sep 30, 1955 Today's Date: 08/31/2012  Time: 1330-1400 Pt denies pain Individual therapy  Pt engaged in sit<>stand and functional amb with RW in room for home mgmt tasks.  Pt required steady A when ambulating in room and min A when reaching outside BOS while standing.  Pt returned to w/c with quick release belt in place.  Focus on activity tolerance, functional amb with RW, dynamic standing balance, and safety awareness.   [name] [facility] 08/31/2012, 2:49 PM

## 2012-09-01 ENCOUNTER — Inpatient Hospital Stay (HOSPITAL_COMMUNITY): Payer: BC Managed Care – PPO

## 2012-09-01 ENCOUNTER — Inpatient Hospital Stay (HOSPITAL_COMMUNITY): Payer: BC Managed Care – PPO | Admitting: Physical Therapy

## 2012-09-01 ENCOUNTER — Inpatient Hospital Stay (HOSPITAL_COMMUNITY): Payer: BC Managed Care – PPO | Admitting: Speech Pathology

## 2012-09-01 DIAGNOSIS — IMO0001 Reserved for inherently not codable concepts without codable children: Secondary | ICD-10-CM

## 2012-09-01 DIAGNOSIS — E1142 Type 2 diabetes mellitus with diabetic polyneuropathy: Secondary | ICD-10-CM

## 2012-09-01 DIAGNOSIS — E1165 Type 2 diabetes mellitus with hyperglycemia: Secondary | ICD-10-CM

## 2012-09-01 DIAGNOSIS — G931 Anoxic brain damage, not elsewhere classified: Secondary | ICD-10-CM

## 2012-09-01 LAB — URINE CULTURE: Colony Count: 30000

## 2012-09-01 LAB — GLUCOSE, CAPILLARY
Glucose-Capillary: 253 mg/dL — ABNORMAL HIGH (ref 70–99)
Glucose-Capillary: 154 mg/dL — ABNORMAL HIGH (ref 70–99)

## 2012-09-01 MED ORDER — NITROFURANTOIN MONOHYD MACRO 100 MG PO CAPS
100.0000 mg | ORAL_CAPSULE | Freq: Two times a day (BID) | ORAL | Status: AC
  Administered 2012-09-01 – 2012-09-10 (×20): 100 mg via ORAL
  Filled 2012-09-01 (×21): qty 1

## 2012-09-01 NOTE — Progress Notes (Signed)
Speech Language Pathology Daily Session Note  Patient Details  Name: Thomas Frye MRN: [id number] Date of Birth: 12/22/1955  Today's Date: 09/01/2012 Time: 1330-1415 Time Calculation (min): 45 min  Short Term Goals: Week 1: SLP Short Term Goal 1 (Week 1): Pt will demonstrate sustained attention to task for 30 miutes with supervision verbal cues for redirection SLP Short Term Goal 2 (Week 1): Pt will demonstrate functional problem solving with basic and familiar tasks with Min A verbal and question cues.  SLP Short Term Goal 3 (Week 1): Pt will identify 1 physical and 1 cognitive deficit with Min A semantic and question cues.  SLP Short Term Goal 4 (Week 1): Pt will utilize external memory aids to increase recall/carryover of newly learned information with Min A verbal and question cues.  SLP Short Term Goal 5 (Week 1): Pt will express wants/needs at the sentence level with Min A question cues to self-monitor and correct errors.   Skilled Therapeutic Interventions: Skilled treatment session focused on cognitive-linguistic goals. SLP facilitated session with Min assist question cues to accurately complete 4 step picture sequencing task and increased wait time to verbally describe tasks in pictures.  Patient's verbal expression was basic word-phrase level; as a result, SLP introduced a description task that required patient to produce phrase-sentence level.  SLP facilitated this task with Mod faded to Min assist question cues to provide accurate and specific descriptions of things written on cards.   SLP educate patient regarding use of description as a word finding strategy during conversation; patient verbalized understanding.   FIM:  Comprehension Comprehension Mode: Auditory Comprehension: 4-Understands basic 75 - 89% of the time/requires cueing 10 - 24% of the time Expression Expression Mode: Verbal Expression: 4-Expresses basic 75 - 89% of the time/requires cueing 10 - 24% of the time.  Needs helper to occlude trach/needs to repeat words. Social Interaction Social Interaction: 5-Interacts appropriately 90% of the time - Needs monitoring or encouragement for participation or interaction. Problem Solving Problem Solving: 3-Solves basic 50 - 74% of the time/requires cueing 25 - 49% of the time Memory Memory: 3-Recognizes or recalls 50 - 74% of the time/requires cueing 25 - 49% of the time FIM - Eating Eating Activity: 5: Set-up assist for open containers  Pain Pain Assessment Pain Assessment: No/denies pain  Therapy/Group: Individual Therapy  Charlane Ferretti., CCC-SLP [phone]  Titus Drone 09/01/2012, 3:30 PM

## 2012-09-01 NOTE — Progress Notes (Addendum)
Occupational Therapy Session Note  Patient Details  Name: Thomas Frye MRN: [id number] Date of Birth: 05/06/1955  Today's Date: 09/01/2012  Session 1 Time: 9629-5284 Time Calculation (min): 44 min  Short Term Goals: Week 1:  OT Short Term Goal 1 (Week 1): Pt would transfer to toilet/ BSC with steady A consistently  OT Short Term Goal 2 (Week 1): Pt will maintain standing balance with min A for clothing management  OT Short Term Goal 3 (Week 1): Pt will pick out clothing in prep for dressing with supervision OT Short Term Goal 4 (Week 1): Pt will perform grooming with setup  OT Short Term Goal 5 (Week 1): Pt will demonstrate improved righting reactions with LOB requiring only min A to correct  balance  Skilled Therapeutic Interventions/Progress Updates:    Pt in bed upon arrival.  Pt stated he just needed to wash up at sink and change clothing this morning.  After donning CAM boot pt amb with RW to gather clothing and bathe at sink with sit<>stand from w/c.  While standing at sink pt exhibited LOB X 1 but was able to self correct with UE support on sink.  Pt required steady A while standing to pull up pants and fasten pants.  Pt completed all grooming tasks while seated at sink.  Focus on activity tolerance, dynamic standing balance, safety awareness, and sequencing.    Therapy Documentation Precautions:  Precautions Precautions: Fall Required Braces or Orthoses: Other Brace/Splint Other Brace/Splint: cam boot R ankle OOB Restrictions Weight Bearing Restrictions: No Other Position/Activity Restrictions: Has blue PRAFO for in the bed.     Pain: Pain Assessment Pain Assessment: No/denies pain  See FIM for current functional status  Therapy/Group: Individual Therapy  Session 2 Time: 1450-1520 Individual Therapy Pt missed 15 mins skilled OT services.  RN reported orthostatic BP concerns and requested monitoring of BP.  Although pt asymptomatic with lower BP when standing  therapy session was shortened.  BP sitting=106/65 standing=68/46  Pt practiced sit<>stand X 6 and static standing without UE support for simple tasks with steady A.  Pt exhibited no LOB.  Pt reported no lightheadedness or dizziness.    [name] [facility] 09/01/2012, 8:45 AM

## 2012-09-01 NOTE — Progress Notes (Signed)
Physical Therapy Note  Patient Details  Name: Thomas Frye MRN: [id number] Date of Birth: 10-30-1955 Today's Date: 09/01/2012  7829-5621 (55 minutes) individual Pain: no reported pain Other: Cam boot RT foot  Focus of treatment: Therapeutic exercise focused on activity tolerance; gait training with AD; therapeutic activities focused on standing balance Treatment: Pt up in wc with no reported complaints; transfer wc >< Nustep for activity tolerance SBA RW; Nustep Level 5 X 10 minutes; gait 15 RW min to SBA; sit to stand from mat to perform reaching activity with c/o dizziness (BP sitting 100/61 pulse 87 , supine 104/65 , standing 71/47 with increased c/o dizziness; returned to room and placed in bed supine / notified nurse.    Krysia Zahradnik,JIM 09/01/2012, 9:05 AM

## 2012-09-01 NOTE — Progress Notes (Signed)
Pt does not wish to wear home CPAP machine tonight. Pt states that is his wife needs to take it to get fixed. Pt encouraged to call RT if pt would like to wear hospital machine. Pt states breathing is good. No distress noted.

## 2012-09-01 NOTE — Progress Notes (Signed)
Patient ID: Thomas Frye, male   DOB: 03/11/1956, 57 y.o.   MRN: [id number] Subjective/Complaints: Pt states he ate watermelon yest PM  Elevated CBGs A 12 point review of systems has been performed and if not noted above is otherwise negative.   Objective: Vital Signs: Blood pressure 147/83, pulse 77, temperature 98.1 F (36.7 C), temperature source Oral, resp. rate 17, height 6\' 4"  (1.93 m), weight 129.2 kg (284 lb 13.4 oz), SpO2 97.00%. No results found.  Recent Labs  08/30/12 0535  WBC 12.2*  HGB 11.2*  HCT 32.8*  PLT 259    Recent Labs  08/30/12 0535 08/31/12 1834  NA 138  --   K 3.8  --   CL 100  --   GLUCOSE 175* 439*  BUN 11  --   CREATININE 0.77  --   CALCIUM 9.2  --    CBG (last 3)   Recent Labs  08/31/12 1937 08/31/12 2227 09/01/12 0757  GLUCAP 366* 170* 197*    Wt Readings from Last 3 Encounters:  08/29/12 129.2 kg (284 lb 13.4 oz)  08/22/12 126.4 kg (278 lb 10.6 oz)  12/10/10 129.729 kg (286 lb)    Physical Exam:  Constitutional: He appears well-developed and well-nourished.  HENT:  Head: Normocephalic and atraumatic.  Right Ear: External ear normal.  Left Ear: External ear normal.  Eyes: Conjunctivae and EOM are normal. Pupils are equal, round, and reactive to light.  Neck: Normal range of motion. No JVD present. No tracheal deviation present. No thyromegaly present.  Cardiovascular: Normal rate and regular rhythm.  Pulmonary/Chest: Effort normal and breath sounds normal. No respiratory distress. He has no wheezes. He has no rales.  Abdominal: Soft. Bowel sounds are normal. He exhibits no distension. There is no tenderness.  Musculoskeletal: He exhibits no edema.  Tends to keep RLE externally rotated. 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 Right lateral foot with 2 cm stage II ulcer along 5th MT--clean. Left toes with loss of toe nails.  Lymphadenopathy:  He has no cervical adenopathy.  Neurological: He is alert.  He displays abnormal reflex. Coordination abnormal.  Flat affect but no signs of distress. Makes eye contact. Slow measured speech with hesitations and expressive deficits. Decreased initiation and delayed processing. Able to follow simple one step commands but occasional perseveration noted. Oriented to self and to rehab hospital. Remembered i was his doctor but not my name.  Unable to do simple math or state months of the year without cues. Mild left inattention? Poor awareness of deficits. UES grossly 4/5. LE HF is 2-3/5 and is grossly 4/5 distally. RLE limited by wound/boot. Stocking glove sensory loss below the knees.  Skin: Skin is warm and dry.  Psychiatric: Judgment and thought content normal. His affect is blunt. His speech is delayed. He is slowed. He exhibits abnormal recent memory and abnormal remote memory   Assessment/Plan: 1. 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 Physiatrist is providing close team supervision and 24 hour management of active medical problems listed below. Physiatrist and rehab team continue to assess barriers to discharge/monitor patient progress toward functional and medical goals. FIM: FIM - Bathing Bathing Steps Patient Completed: Chest;Right Arm;Left Arm;Abdomen;Front perineal area;Right upper leg;Left upper leg;Right lower leg (including foot);Left lower leg (including foot) Bathing: 4: Min-Patient completes 8-9 44f 10 parts or 75+ percent  FIM - Upper Body Dressing/Undressing Upper body dressing/undressing  steps patient completed: Thread/unthread right sleeve of pullover shirt/dresss;Thread/unthread left sleeve of pullover shirt/dress;Put head through opening of pull over shirt/dress;Pull shirt over trunk Upper body dressing/undressing: 5: Set-up assist to: Obtain clothing/put away FIM - Lower Body Dressing/Undressing Lower body dressing/undressing  steps patient completed: Thread/unthread left pants leg;Thread/unthread right pants leg;Don/Doff left sock;Don/Doff left shoe;Thread/unthread right underwear leg;Thread/unthread left underwear leg;Pull underwear up/down;Pull pants up/down Lower body dressing/undressing: 4: Min-Patient completed 75 plus % of tasks  FIM - Toileting Toileting steps completed by patient: Adjust clothing prior to toileting;Performs perineal hygiene Toileting: 3: Mod-Patient completed 2 of 3 steps  FIM - Toilet Transfers Toilet Transfers: 4-From toilet/BSC: Min A (steadying Pt. > 75%);4-To toilet/BSC: Min A (steadying Pt. > 75%)  FIM - Bed/Chair Transfer Bed/Chair Transfer: 4: Chair or W/C > Bed: Min A (steadying Pt. > 75%);4: Bed > Chair or W/C: Min A (steadying Pt. > 75%)  FIM - Locomotion: Wheelchair Distance: 50 Locomotion: Wheelchair: 5: Travels 150 ft or more: maneuvers on rugs and over door sills with supervision, cueing or coaxing FIM - Locomotion: Ambulation Ambulation/Gait Assistance: 1: +2 Total assist Locomotion: Ambulation: 1: Travels less than 50 ft with minimal assistance (Pt.>75%)  Comprehension Comprehension Mode: Auditory Comprehension: 4-Understands basic 75 - 89% of the time/requires cueing 10 - 24% of the time  Expression Expression Mode: Verbal Expression: 4-Expresses basic 75 - 89% of the time/requires cueing 10 - 24% of the time. Needs helper to occlude trach/needs to repeat words.  Social Interaction Social Interaction: 5-Interacts appropriately 90% of the time - Needs monitoring or encouragement for participation or interaction.  Problem Solving Problem Solving: 3-Solves basic 50 - 74% of the time/requires cueing 25 - 49% of the time  Memory Memory: 3-Recognizes or recalls 50 - 74% of the time/requires cueing 25 - 49% of the time  Medical Problem List and Plan:  1. DVT Prophylaxis/Anticoagulation: Pharmaceutical: Lovenox  2. Pain Management: N/A  3. Mood: No signs of  distress noted and patient expressing need to get better.Will have LCSW and psychologist follow up for evaluation and support. Provide ego support. Continue lexapro  4. Neuropsych: This patient is not capable of making decisions on his own behalf.  5. DM type 2 with peripheral neuropathy:  . Po intake remains poor.Pt ate concentrated sweet yesterday.He promises to watch his diet better Monitor BS with AC./HS cbg checks. Adjust levemir with novolog for meal coverage.  6. Chronic neurogenic right foot ulcer: Pressure relief measures when in bed--CAM boot out of bed. Continue Cipro and doxycycline indefinitely due to chronic infection per wife. Plans for graft in the future? 7. Urinary retention: Has 30k Ecoli---rx with cipro x 3 days (sens pending)  8. Bowel incontinence: May need probiotic. Will monitor and tract to see if bowel program needed. 9. FEN: poor appetite/intake  -RD follow up  -megace trial  LOS (Days) 3 A FACE TO FACE EVALUATION WAS PERFORMED  Erick Colace 09/01/2012 8:18 AM

## 2012-09-02 ENCOUNTER — Inpatient Hospital Stay (HOSPITAL_COMMUNITY): Payer: BC Managed Care – PPO | Admitting: *Deleted

## 2012-09-02 LAB — GLUCOSE, CAPILLARY
Glucose-Capillary: 294 mg/dL — ABNORMAL HIGH (ref 70–99)
Glucose-Capillary: 274 mg/dL — ABNORMAL HIGH (ref 70–99)

## 2012-09-02 MED ORDER — INSULIN DETEMIR 100 UNIT/ML ~~LOC~~ SOLN
15.0000 [IU] | Freq: Every day | SUBCUTANEOUS | Status: DC
  Administered 2012-09-02: 15 [IU] via SUBCUTANEOUS
  Filled 2012-09-02 (×2): qty 0.15

## 2012-09-02 MED ORDER — BETHANECHOL CHLORIDE 10 MG PO TABS
10.0000 mg | ORAL_TABLET | Freq: Three times a day (TID) | ORAL | Status: DC
  Administered 2012-09-02 – 2012-09-03 (×3): 10 mg via ORAL
  Filled 2012-09-02 (×6): qty 1

## 2012-09-02 NOTE — Progress Notes (Signed)
Patient refused scheduled senna s and megace. Also, refused CPAP machine. CAM boot removed at HS and prevalon boot applied. Bed alarm in use along with side rails up X 3 for decreased safety awareness. Foley patent. Alfredo Martinez A

## 2012-09-02 NOTE — Progress Notes (Signed)
Patient ID: Thomas Frye, male   DOB: 11-24-1955, 57 y.o.   MRN: [id number] Subjective/Complaints: BPs low yesterday, started flomax night before A 12 point review of systems has been performed and if not noted above is otherwise negative.   Objective: Vital Signs: Blood pressure 172/91, pulse 80, temperature 98 F (36.7 C), temperature source Oral, resp. rate 19, height 6\' 4"  (1.93 m), weight 129.2 kg (284 lb 13.4 oz), SpO2 95.00%. No results found. No results found for this basename: WBC, HGB, HCT, PLT,  in the last 72 hours  Recent Labs  08/31/12 1834  GLUCOSE 439*   CBG (last 3)   Recent Labs  09/01/12 1627 09/01/12 2032 09/02/12 0733  GLUCAP 253* 154* 294*    Wt Readings from Last 3 Encounters:  08/29/12 129.2 kg (284 lb 13.4 oz)  08/22/12 126.4 kg (278 lb 10.6 oz)  12/10/10 129.729 kg (286 lb)    Physical Exam:  Constitutional: He appears well-developed and well-nourished.  HENT:  Head: Normocephalic and atraumatic.  Right Ear: External ear normal.  Left Ear: External ear normal.  Eyes: Conjunctivae and EOM are normal. Pupils are equal, round, and reactive to light.  Neck: Normal range of motion. No JVD present. No tracheal deviation present. No thyromegaly present.  Cardiovascular: Normal rate and regular rhythm.  Pulmonary/Chest: Effort normal and breath sounds normal. No respiratory distress. He has no wheezes. He has no rales.  Abdominal: Soft. Bowel sounds are normal. He exhibits no distension. There is no tenderness.  Musculoskeletal: He exhibits no edema.  Tends to keep RLE externally rotated. 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 Right lateral foot with 2 cm stage II ulcer along 5th MT--clean. Left toes with loss of toe nails.  Lymphadenopathy:  He has no cervical adenopathy.  Neurological: He is alert. He displays abnormal reflex. Coordination abnormal.  Flat affect but no signs of distress. Makes eye contact. Slow measured  speech with hesitations and expressive deficits. Decreased initiation and delayed processing. Able to follow simple one step commands but occasional perseveration noted. Oriented to self and to rehab hospital. Remembered i was his doctor but not my name.  Unable to do simple math or state months of the year without cues. Mild left inattention? Poor awareness of deficits. UES grossly 4/5. LE HF is 2-3/5 and is grossly 4/5 distally. RLE limited by wound/boot. Stocking glove sensory loss below the knees.  Skin: Skin is warm and dry.  Psychiatric: Judgment and thought content normal. His affect is blunt. His speech is delayed. He is slowed. He exhibits abnormal recent memory and abnormal remote memory   Assessment/Plan: 1. 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 Physiatrist is providing close team supervision and 24 hour management of active medical problems listed below. Physiatrist and rehab team continue to assess barriers to discharge/monitor patient progress toward functional and medical goals. FIM: FIM - Bathing Bathing Steps Patient Completed: Chest;Right Arm;Left Arm;Abdomen;Front perineal area;Right upper leg;Left upper leg;Right lower leg (including foot);Left lower leg (including foot) Bathing: 4: Min-Patient completes 8-9 5f 10 parts or 75+ percent  FIM - Upper Body Dressing/Undressing Upper body dressing/undressing steps patient completed: Thread/unthread right sleeve of pullover shirt/dresss;Thread/unthread left sleeve of pullover shirt/dress;Put head through opening of pull over shirt/dress;Pull shirt over trunk Upper body dressing/undressing: 5: Set-up assist to: Obtain clothing/put away FIM - Lower Body Dressing/Undressing Lower body dressing/undressing steps patient completed: Thread/unthread  left pants leg;Thread/unthread right pants leg;Don/Doff left sock;Don/Doff left  shoe;Pull pants up/down;Fasten/unfasten left shoe;Fasten/unfasten pants Lower body dressing/undressing: 4: Steadying Assist  FIM - Toileting Toileting steps completed by patient: Adjust clothing prior to toileting;Performs perineal hygiene Toileting: 3: Mod-Patient completed 2 of 3 steps  FIM - Toilet Transfers Toilet Transfers: 4-From toilet/BSC: Min A (steadying Pt. > 75%);4-To toilet/BSC: Min A (steadying Pt. > 75%)  FIM - Bed/Chair Transfer Bed/Chair Transfer: 4: Chair or W/C > Bed: Min A (steadying Pt. > 75%);4: Bed > Chair or W/C: Min A (steadying Pt. > 75%)  FIM - Locomotion: Wheelchair Distance: 50 Locomotion: Wheelchair: 5: Travels 150 ft or more: maneuvers on rugs and over door sills with supervision, cueing or coaxing FIM - Locomotion: Ambulation Ambulation/Gait Assistance: 1: +2 Total assist Locomotion: Ambulation: 1: Travels less than 50 ft with minimal assistance (Pt.>75%)  Comprehension Comprehension Mode: Auditory Comprehension: 4-Understands basic 75 - 89% of the time/requires cueing 10 - 24% of the time  Expression Expression Mode: Verbal Expression: 4-Expresses basic 75 - 89% of the time/requires cueing 10 - 24% of the time. Needs helper to occlude trach/needs to repeat words.  Social Interaction Social Interaction: 5-Interacts appropriately 90% of the time - Needs monitoring or encouragement for participation or interaction.  Problem Solving Problem Solving: 3-Solves basic 50 - 74% of the time/requires cueing 25 - 49% of the time  Memory Memory: 3-Recognizes or recalls 50 - 74% of the time/requires cueing 25 - 49% of the time  Medical Problem List and Plan:  1. DVT Prophylaxis/Anticoagulation: Pharmaceutical: Lovenox  2. Pain Management: N/A  3. Mood: No signs of distress noted and patient expressing need to get better.Will have LCSW and psychologist follow up for evaluation and support. Provide ego support. Continue lexapro  4. Neuropsych: This patient is  not capable of making decisions on his own behalf.  5. DM type 2 with peripheral neuropathy:  . Po intake remains poor.Pt ate concentrated sweet yesterday.He promises to watch his diet better Monitor BS with AC./HS cbg checks. Adjust levemir with novolog for meal coverage.  6. Chronic neurogenic right foot ulcer: Pressure relief measures when in bed--CAM boot out of bed. Continue Cipro and doxycycline indefinitely due to chronic infection per wife. Plans for graft in the future? 7. Urinary retention: cannot tolerate flomax, trial urecholine 8. Bowel incontinence: May need probiotic. Will monitor and tract to see if bowel program needed. 9. FEN: poor appetite/intake  -RD follow up  -megace trial  LOS (Days) 4 A FACE TO FACE EVALUATION WAS PERFORMED  Erick Colace 09/02/2012 9:53 AM

## 2012-09-02 NOTE — Progress Notes (Signed)
Pt stated wife is trying to get machine looked/fixed because something is not right with the settings. Pt encouraged to call RT if pt would like to use hospital machine. No distress noted at this time.

## 2012-09-02 NOTE — Progress Notes (Signed)
Occupational Therapy Note  Patient Details  Name: Thomas Frye MRN: [id number] Date of Birth: February 26, 1956 Today's Date: 09/02/2012  1530-1615  (45 min) Individual session Pain :  None  Pt lying in recliner asleep.  Aroused easily.  Focus on activity tolerance, functional amb with RW, dynamic standing balance, and safety awareness. Ambulated with minimal assist and RW to sink.  Brushed teeth in standing fpr 4 minutes with close supervision for balance.  Rested for 1 min.  Ambulated toilet and transferred minimal assist.  Ambulated over to chest of drawers and named items on top with minimal assist with word finding. Ambulated out to doorway and back to recliner.  No SOB noted.  Left in recliner with safety belt in place.     Humberto Seals 09/02/2012, 3:46 PM

## 2012-09-03 ENCOUNTER — Inpatient Hospital Stay (HOSPITAL_COMMUNITY): Payer: BC Managed Care – PPO | Admitting: Occupational Therapy

## 2012-09-03 ENCOUNTER — Inpatient Hospital Stay (HOSPITAL_COMMUNITY): Payer: BC Managed Care – PPO | Admitting: Physical Therapy

## 2012-09-03 ENCOUNTER — Inpatient Hospital Stay (HOSPITAL_COMMUNITY): Payer: BC Managed Care – PPO | Admitting: Speech Pathology

## 2012-09-03 DIAGNOSIS — IMO0001 Reserved for inherently not codable concepts without codable children: Secondary | ICD-10-CM

## 2012-09-03 DIAGNOSIS — E1142 Type 2 diabetes mellitus with diabetic polyneuropathy: Secondary | ICD-10-CM

## 2012-09-03 DIAGNOSIS — E1165 Type 2 diabetes mellitus with hyperglycemia: Secondary | ICD-10-CM

## 2012-09-03 DIAGNOSIS — G931 Anoxic brain damage, not elsewhere classified: Secondary | ICD-10-CM

## 2012-09-03 MED ORDER — INSULIN DETEMIR 100 UNIT/ML ~~LOC~~ SOLN
20.0000 [IU] | Freq: Every day | SUBCUTANEOUS | Status: DC
  Administered 2012-09-03 – 2012-09-04 (×2): 20 [IU] via SUBCUTANEOUS
  Filled 2012-09-03 (×3): qty 0.2

## 2012-09-03 MED ORDER — LISINOPRIL 2.5 MG PO TABS
2.5000 mg | ORAL_TABLET | Freq: Every day | ORAL | Status: DC
  Administered 2012-09-03 – 2012-09-05 (×2): 2.5 mg via ORAL
  Filled 2012-09-03 (×4): qty 1

## 2012-09-03 MED ORDER — INSULIN ASPART 100 UNIT/ML ~~LOC~~ SOLN
0.0000 [IU] | Freq: Three times a day (TID) | SUBCUTANEOUS | Status: DC
  Administered 2012-09-03: 4 [IU] via SUBCUTANEOUS
  Administered 2012-09-03: 20 [IU] via SUBCUTANEOUS
  Administered 2012-09-04: 4 [IU] via SUBCUTANEOUS
  Administered 2012-09-04: 15 [IU] via SUBCUTANEOUS
  Administered 2012-09-04 – 2012-09-05 (×2): 4 [IU] via SUBCUTANEOUS
  Administered 2012-09-05: 11 [IU] via SUBCUTANEOUS
  Administered 2012-09-05: 18:00:00 via SUBCUTANEOUS
  Administered 2012-09-06: 4 [IU] via SUBCUTANEOUS
  Administered 2012-09-06 – 2012-09-07 (×3): 7 [IU] via SUBCUTANEOUS
  Administered 2012-09-08: 11 [IU] via SUBCUTANEOUS
  Administered 2012-09-08: 3 [IU] via SUBCUTANEOUS
  Administered 2012-09-09: 11 [IU] via SUBCUTANEOUS
  Administered 2012-09-09: 3 [IU] via SUBCUTANEOUS
  Administered 2012-09-09: 7 [IU] via SUBCUTANEOUS
  Administered 2012-09-10: 3 [IU] via SUBCUTANEOUS
  Administered 2012-09-10 (×2): 7 [IU] via SUBCUTANEOUS
  Administered 2012-09-11 (×2): 3 [IU] via SUBCUTANEOUS
  Administered 2012-09-11 – 2012-09-12 (×3): 4 [IU] via SUBCUTANEOUS
  Administered 2012-09-12: 7 [IU] via SUBCUTANEOUS

## 2012-09-03 MED ORDER — ADULT MULTIVITAMIN W/MINERALS CH
1.0000 | ORAL_TABLET | Freq: Every day | ORAL | Status: DC
  Administered 2012-09-03 – 2012-09-13 (×11): 1 via ORAL
  Filled 2012-09-03 (×14): qty 1

## 2012-09-03 MED ORDER — INSULIN ASPART 100 UNIT/ML ~~LOC~~ SOLN: 0.0000 [IU] | Freq: Every day | SUBCUTANEOUS | Status: DC

## 2012-09-03 MED ORDER — INSULIN ASPART 100 UNIT/ML ~~LOC~~ SOLN
3.0000 [IU] | Freq: Three times a day (TID) | SUBCUTANEOUS | Status: DC
  Administered 2012-09-03 – 2012-09-05 (×6): 3 [IU] via SUBCUTANEOUS

## 2012-09-03 MED ORDER — TAMSULOSIN HCL 0.4 MG PO CAPS
0.4000 mg | ORAL_CAPSULE | Freq: Every day | ORAL | Status: DC
Start: 2012-09-03 — End: 2012-09-04
  Administered 2012-09-03: 0.4 mg via ORAL
  Filled 2012-09-03 (×2): qty 1

## 2012-09-03 MED ORDER — GLUCERNA SHAKE PO LIQD
237.0000 mL | ORAL | Status: DC
  Administered 2012-09-04 – 2012-09-09 (×3): 237 mL via ORAL

## 2012-09-03 NOTE — Progress Notes (Signed)
Late entry . Patient  Noted to be orthostatic this am during PT session. [name] ,[place] notified . Patient reported feeling dizzy while standing but returned to baseline sitting . Continue with plan of care .            Thomas Frye

## 2012-09-03 NOTE — Progress Notes (Signed)
Physical Therapy Note  Patient Details  Name: Thomas Frye MRN: [id number] Date of Birth: December 25, 1955 Today's Date: 09/03/2012  7829-5621 (55 minutes) individual Pain: no reported pain Focus of treatment: therapeutic exercise focused on activity tolerance Treatment: Pt up in wc upon arrival with no c/o of pain or dizziness; BP Sitting 101/67 pulse 90; BP standing 74/ 49 pulse 96 (nurse notified) with c/o of increased dizziness; BP sitting  96/59 pulse 92; Nurse reports that pt can participate in PT as long as asymptomatic ; Nustep Level 4 X 10 minutes with BP in sitting 93/ 59 pulse 93 with no symptoms.    Scottlyn Mchaney,JIM 09/03/2012, 9:50 AM

## 2012-09-03 NOTE — Progress Notes (Signed)
Physical Therapy Note  Patient Details  Name: CAMARON CAMMACK MRN: [id number] Date of Birth: 1955/06/24 Today's Date: 09/03/2012  1400-1425 (25 minutes) individual Pain: no reported pain Focus of treatment: Standing tolerance monitoring BP Treatment: BP (sitting) 128/79 / no complaint of dizziness; sit to stand from recliner min assist; standing x 2 - unable to obtain automated BP/ no complaint of dizziness. Returned to recliner with quick release belt applied.    Jameson Morrow,JIM 09/03/2012, 2:23 PM

## 2012-09-03 NOTE — Progress Notes (Signed)
Patient ID: FRUTOSO DIMARE, male   DOB: 01-07-1956, 57 y.o.   MRN: [id number] Subjective/Complaints: BPs up today, CBGs elevated , no pain c/os no dizziness A 12 point review of systems has been performed and if not noted above is otherwise negative.   Objective: Vital Signs: Blood pressure 164/89, pulse 80, temperature 97.7 F (36.5 C), temperature source Oral, resp. rate 18, height 6\' 4"  (1.93 m), weight 129.2 kg (284 lb 13.4 oz), SpO2 96.00%. No results found. No results found for this basename: WBC, HGB, HCT, PLT,  in the last 72 hours  Recent Labs  08/31/12 1834  GLUCOSE 439*   CBG (last 3)   Recent Labs  09/02/12 1610 09/02/12 2046 09/03/12 0715  GLUCAP 274* 257* 221*    Wt Readings from Last 3 Encounters:  08/29/12 129.2 kg (284 lb 13.4 oz)  08/22/12 126.4 kg (278 lb 10.6 oz)  12/10/10 129.729 kg (286 lb)    Physical Exam:  Constitutional: He appears well-developed and well-nourished.  HENT:  Head: Normocephalic and atraumatic.  Right Ear: External ear normal.  Left Ear: External ear normal.  Eyes: Conjunctivae and EOM are normal. Pupils are equal, round, and reactive to light.  Neck: Normal range of motion. No JVD present. No tracheal deviation present. No thyromegaly present.  Cardiovascular: Normal rate and regular rhythm.  Pulmonary/Chest: Effort normal and breath sounds normal. No respiratory distress. He has no wheezes. He has no rales.  Abdominal: Soft. Bowel sounds are normal. He exhibits no distension. There is no tenderness.  Musculoskeletal: He exhibits no edema.  Tends to keep RLE externally rotated. 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 Right lateral foot with 2 cm stage II ulcer along 5th MT--clean. Left toes with loss of toe nails.  Lymphadenopathy:  He has no cervical adenopathy.  Neurological: He is alert. He displays abnormal reflex. Coordination abnormal.  Flat affect but no signs of distress. Makes eye contact. Slow  measured speech with hesitations and expressive deficits. Decreased initiation and delayed processing. Able to follow simple one step commands but occasional perseveration noted. Oriented to self and to rehab hospital. .  Unable to do simple math or state months of the year without cues. Mild left inattention? Poor awareness of deficits. UES grossly 4/5. LE HF is 2-3/5 and is grossly 4/5 distally. RLE limited by wound/boot. Stocking glove sensory loss below the knees.  Skin: Skin is warm and dry.  Psychiatric: Judgment and thought content normal. His affect is blunt. His speech is delayed. He is slowed. He exhibits abnormal recent memory and abnormal remote memory   Assessment/Plan: 1. 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 Physiatrist is providing close team supervision and 24 hour management of active medical problems listed below. Physiatrist and rehab team continue to assess barriers to discharge/monitor patient progress toward functional and medical goals. FIM: FIM - Bathing Bathing Steps Patient Completed: Chest;Right Arm;Left Arm;Abdomen;Front perineal area;Right upper leg;Left upper leg;Left lower leg (including foot) Bathing: 4: Min-Patient completes 8-9 50f 10 parts or 75+ percent  FIM - Upper Body Dressing/Undressing Upper body dressing/undressing steps patient completed: Thread/unthread right sleeve of pullover shirt/dresss;Thread/unthread left sleeve of pullover shirt/dress;Put head through opening of pull over shirt/dress;Pull shirt over trunk Upper body dressing/undressing: 5: Set-up assist to: Obtain clothing/put away FIM - Lower Body Dressing/Undressing Lower body dressing/undressing steps patient completed: Pull underwear up/down;Thread/unthread right pants leg;Thread/unthread left pants leg;Pull  pants up/down;Don/Doff left sock;Don/Doff left shoe Lower body  dressing/undressing: 4: Min-Patient completed 75 plus % of tasks  FIM - Toileting Toileting steps completed by patient: Adjust clothing prior to toileting;Performs perineal hygiene;Adjust clothing after toileting Toileting: 4: Steadying assist  FIM - Toilet Transfers Toilet Transfers: 4-From toilet/BSC: Min A (steadying Pt. > 75%);4-To toilet/BSC: Min A (steadying Pt. > 75%)  FIM - Bed/Chair Transfer Bed/Chair Transfer Assistive Devices: Therapist, occupational: 5: Supine > Sit: Supervision (verbal cues/safety issues);4: Bed > Chair or W/C: Min A (steadying Pt. > 75%)  FIM - Locomotion: Wheelchair Distance: 50 Locomotion: Wheelchair: 5: Travels 150 ft or more: maneuvers on rugs and over door sills with supervision, cueing or coaxing FIM - Locomotion: Ambulation Ambulation/Gait Assistance: 1: +2 Total assist Locomotion: Ambulation: 1: Travels less than 50 ft with minimal assistance (Pt.>75%)  Comprehension Comprehension Mode: Auditory Comprehension: 4-Understands basic 75 - 89% of the time/requires cueing 10 - 24% of the time  Expression Expression Mode: Verbal Expression: 4-Expresses basic 75 - 89% of the time/requires cueing 10 - 24% of the time. Needs helper to occlude trach/needs to repeat words.  Social Interaction Social Interaction: 5-Interacts appropriately 90% of the time - Needs monitoring or encouragement for participation or interaction.  Problem Solving Problem Solving: 3-Solves basic 50 - 74% of the time/requires cueing 25 - 49% of the time  Memory Memory: 3-Recognizes or recalls 50 - 74% of the time/requires cueing 25 - 49% of the time  Medical Problem List and Plan:  1. DVT Prophylaxis/Anticoagulation: Pharmaceutical: Lovenox  2. Pain Management: N/A  3. Mood: No signs of distress noted and patient expressing need to get better.Will have LCSW and psychologist follow up for evaluation and support. Provide ego support. Continue lexapro  4. Neuropsych: This  patient is not capable of making decisions on his own behalf.  5. DM type 2 with peripheral neuropathy:  . Po intake remains poor Monitor BS with AC./HS cbg checks. Adjust levemir with novolog for meal coverage.  6. Chronic neurogenic right foot ulcer: Pressure relief measures when in bed--CAM boot out of bed. Continue Cipro and doxycycline indefinitely due to chronic infection per wife. Plans for graft in the future? 7. Urinary retention: cannot tolerate flomax with lisinopril 10mg , will reduce lisinopril to 2.5mg  and retry flomax, trial urecholine 8. Bowel incontinence: May need probiotic. Will monitor and tract to see if bowel program needed. 9. FEN: poor appetite/intake  -RD follow up  -megace trial  LOS (Days) 5 A FACE TO FACE EVALUATION WAS PERFORMED  Erick Colace 09/03/2012 8:39 AM

## 2012-09-03 NOTE — Progress Notes (Signed)
Patient with orthostatic BP changes today. Will discontinue urecholine and leave flomax on board. Monitor for now and encourage fluid intake. Patient with improvement in po intake per nursing. He's refusing megace due to dislike/taste--will discontinue.

## 2012-09-03 NOTE — Progress Notes (Addendum)
NUTRITION FOLLOW UP  DOCUMENTATION CODES  Per approved criteria   -Obesity unspecified    Intervention:   1. Discontinue Prostat - pt refusing 2. Decrease Glucerna Shake to daily per pt request 3. MVI daily 4. RD to continue to follow nutrition care plan  Nutrition Dx:   Inadequate oral intake related to poor appetite as evidenced by PO <50% of meals, refusing supplements. Resolved.  New Nutrition Dx: Increased nutrient needs r/t wound healing AEB estimated needs.  Goal:   PO intake to meet at least 90% of estimated needs.  Monitor:   Weights, labs, PO intake, supplement acceptance  Assessment:   Pt admitted to [facility] for rehab r/t anoxic brain injury. Pt followed by inpatient clinical nutrition staff during recent admission due to poor appetite and intake.  Pt now with adequate appetite, consuming >75% of Carbohydrate Modified Medium meals. Pt is refusing megace 2/2 taste. Pt reports that he does not like Prostat but will take Glucerna Shakes. RD saw lunch tray, pt consumed 100%.  Height: Ht Readings from Last 1 Encounters:  08/29/12 6\' 4"  (1.93 m)    Weight Status:   Wt Readings from Last 1 Encounters:  08/29/12 284 lb 13.4 oz (129.2 kg)  No new weight.  Re-estimated needs:  Kcal: 2400 - 2600 Protein: 120 - 140 grams Fluid: at least 2.5 liters daily  Skin:  stage 4 pressure ulcer on right foot; WOC RN is following. Wound is the result of a contact cast. Was planning for skin graft on 5/30  Diet Order: Carb Control Medium   Intake/Output Summary (Last 24 hours) at 09/03/12 1348 Last data filed at 09/03/12 0700  Gross per 24 hour  Intake    840 ml  Output   1550 ml  Net   -710 ml    Last BM: 6/8   Labs:   Recent Labs Lab 08/29/12 0515 08/30/12 0535 08/31/12 1834  NA 138 138  --   K 3.7 3.8  --   CL 101 100  --   CO2 27 29  --   BUN 13 11  --   CREATININE 0.71 0.77  --   CALCIUM 9.4 9.2  --   GLUCOSE 176* 175* 439*    CBG (last 3)   Recent  Labs  09/02/12 2046 09/03/12 0715 09/03/12 1121  GLUCAP 257* 221* 384*    Scheduled Meds: . aspirin EC  81 mg Oral Daily  . docusate sodium  100 mg Oral BID  . doxycycline  100 mg Oral Q12H  . enoxaparin (LOVENOX) injection  40 mg Subcutaneous Q24H  . escitalopram  10 mg Oral Daily  . feeding supplement  237 mL Oral TID WC  . feeding supplement  30 mL Oral TID WC  . insulin aspart  0-20 Units Subcutaneous TID WC  . insulin aspart  0-5 Units Subcutaneous QHS  . insulin aspart  3 Units Subcutaneous TID WC  . insulin detemir  20 Units Subcutaneous Q supper  . insulin detemir  45 Units Subcutaneous Daily  . lisinopril  2.5 mg Oral Daily  . loratadine  10 mg Oral Daily  . nitrofurantoin (macrocrystal-monohydrate)  100 mg Oral Q12H  . pantoprazole  40 mg Oral Q1200  . saccharomyces boulardii  250 mg Oral BID  . simvastatin  10 mg Oral q1800  . tamsulosin  0.4 mg Oral QPC supper  . triamcinolone cream   Topical TID    Continuous Infusions:   none  Jarold Motto MS,  RD, LDN Pager: [phone] After-hours pager: [phone]

## 2012-09-03 NOTE — Progress Notes (Signed)
Speech Language Pathology Daily Session Note  Patient Details  Name: Thomas Frye MRN: [id number] Date of Birth: April 01, 1955  Today's Date: 09/03/2012 Time: 2440-1027 Time Calculation (min): 45 min  Short Term Goals: Week 1: SLP Short Term Goal 1 (Week 1): Pt will demonstrate sustained attention to task for 30 miutes with supervision verbal cues for redirection SLP Short Term Goal 2 (Week 1): Pt will demonstrate functional problem solving with basic and familiar tasks with Min A verbal and question cues.  SLP Short Term Goal 3 (Week 1): Pt will identify 1 physical and 1 cognitive deficit with Min A semantic and question cues.  SLP Short Term Goal 4 (Week 1): Pt will utilize external memory aids to increase recall/carryover of newly learned information with Min A verbal and question cues.  SLP Short Term Goal 5 (Week 1): Pt will express wants/needs at the sentence level with Min A question cues to self-monitor and correct errors.   Skilled Therapeutic Interventions: Skilled treatment session focused on cognitive-linguistic goals. SLP facilitated session with Mod faded to Min assist question cues to provide accurate and specific descriptions of specific items written on cards during a description task with focus on increased length of utterance. Pt demonstrated decreased working memory throughout the task and educated on memory compensatory strategies. Pt verbalized understanding but needs reinforcement.    FIM:  Comprehension Comprehension Mode: Auditory Comprehension: 4-Understands basic 75 - 89% of the time/requires cueing 10 - 24% of the time Expression Expression Mode: Verbal Expression: 4-Expresses basic 75 - 89% of the time/requires cueing 10 - 24% of the time. Needs helper to occlude trach/needs to repeat words. Social Interaction Social Interaction: 5-Interacts appropriately 90% of the time - Needs monitoring or encouragement for participation or interaction. Problem  Solving Problem Solving: 3-Solves basic 50 - 74% of the time/requires cueing 25 - 49% of the time Memory Memory: 3-Recognizes or recalls 50 - 74% of the time/requires cueing 25 - 49% of the time FIM - Eating Eating Activity: 5: Set-up assist for open containers  Pain Pain Assessment Pain Assessment: No/denies pain Pain Score: 0-No pain  Therapy/Group: Individual Therapy  Neno Hohensee 09/03/2012, 3:47 PM

## 2012-09-03 NOTE — Progress Notes (Signed)
Occupational Therapy Session Note  Patient Details  Name: BRENNYN HAISLEY MRN: [id number] Date of Birth: 10-Mar-1956  Today's Date: 09/03/2012 Time: 0732-0830 Time Calculation (min): 58 min  Short Term Goals: Week 1:  OT Short Term Goal 1 (Week 1): Pt would transfer to toilet/ BSC with steady A consistently  OT Short Term Goal 2 (Week 1): Pt will maintain standing balance with min A for clothing management  OT Short Term Goal 3 (Week 1): Pt will pick out clothing in prep for dressing with supervision OT Short Term Goal 4 (Week 1): Pt will perform grooming with setup  OT Short Term Goal 5 (Week 1): Pt will demonstrate improved righting reactions with LOB requiring only min A to correct  balance  Skilled Therapeutic Interventions/Progress Updates:    Pt seen for ADL retraining with focus on transfers, functional mobility with RW, sit <> stand, and appropriate placement of LLE during sit to stands, activity tolerance, and safety awareness.  Donned CAM boot sitting EOB, pt ambulated with RW to gather clothing prior to shower.  Pt reports need to toilet, min assist toilet transfer with physical assist and cues to complete full pivot before sitting on toilet.  Pt completed bathing at shower level with overall supervision.  No LOB this session, sit to stand at sink with LB dressing.  Grooming conducted in sitting with setup assist to obtain necessary items.    Therapy Documentation Precautions:  Precautions Precautions: Fall Required Braces or Orthoses: Other Brace/Splint Other Brace/Splint: cam boot R ankle OOB Restrictions Weight Bearing Restrictions: No Other Position/Activity Restrictions: Has blue PRAFO for in the bed.   General:   Vital Signs: Therapy Vitals Temp: 97.7 F (36.5 C) Temp src: Oral Pulse Rate: 80 Resp: 18 BP: 164/89 mmHg Patient Position, if appropriate: Lying Oxygen Therapy SpO2: 96 % O2 Device: None (Room air) Pain: Pain Assessment Pain Assessment: No/denies  pain  See FIM for current functional status  Therapy/Group: Individual Therapy  Leonette Monarch 09/03/2012, 8:37 AM

## 2012-09-04 ENCOUNTER — Encounter (HOSPITAL_COMMUNITY): Payer: BC Managed Care – PPO | Admitting: Occupational Therapy

## 2012-09-04 ENCOUNTER — Inpatient Hospital Stay (HOSPITAL_COMMUNITY): Payer: BC Managed Care – PPO | Admitting: Physical Therapy

## 2012-09-04 ENCOUNTER — Inpatient Hospital Stay (HOSPITAL_COMMUNITY): Payer: BC Managed Care – PPO | Admitting: Speech Pathology

## 2012-09-04 LAB — BASIC METABOLIC PANEL
BUN: 15 mg/dL (ref 6–23)
Chloride: 99 mEq/L (ref 96–112)
GFR calc non Af Amer: >90 mL/min (ref >90)
Glucose, Bld: 158 mg/dL — ABNORMAL HIGH (ref 70–99)
Potassium: 4.1 mEq/L (ref 3.5–5.1)

## 2012-09-04 LAB — CBC
HCT: 35.4 % — ABNORMAL LOW (ref 39.0–52.0)
Hemoglobin: 11.7 g/dL — ABNORMAL LOW (ref 13.0–17.0)
MCH: 31.7 pg (ref 26.0–34.0)
MCHC: 33.1 g/dL (ref 30.0–36.0)

## 2012-09-04 LAB — GLUCOSE, CAPILLARY: Glucose-Capillary: 198 mg/dL — ABNORMAL HIGH (ref 70–99)

## 2012-09-04 NOTE — Progress Notes (Signed)
Occupational Therapy Session Note  Patient Details  Name: Thomas Frye MRN: [id number] Date of Birth: Oct 09, 1955  Today's Date: 09/04/2012 Time: 1115-1200 Time Calculation (min): 45 min  Short Term Goals: Week 1:  OT Short Term Goal 1 (Week 1): Pt would transfer to toilet/ BSC with steady A consistently  OT Short Term Goal 2 (Week 1): Pt will maintain standing balance with min A for clothing management  OT Short Term Goal 3 (Week 1): Pt will pick out clothing in prep for dressing with supervision OT Short Term Goal 4 (Week 1): Pt will perform grooming with setup  OT Short Term Goal 5 (Week 1): Pt will demonstrate improved righting reactions with LOB requiring only min A to correct  balance  Skilled Therapeutic Interventions/Progress Updates:    1:1 self care retraining at shower level with focus on transfers and functional ambulation with RW, sit to stands, standing balance without UE support, toilet transfers, toileting with emphasis on clothing management and activity tolerance. Pt without c/o dizziness or lightheadedness with standing. Pt with slow processing due to fatigue. Pt didn't demonstrate any LOB during the session.  Therapy Documentation Precautions:  Precautions Precautions: Fall Required Braces or Orthoses: Other Brace/Splint Other Brace/Splint: cam boot R ankle OOB Restrictions Weight Bearing Restrictions: No Other Position/Activity Restrictions: Has blue PRAFO for in the bed.   Pain:  no c/o pain  See FIM for current functional status  Therapy/Group: Individual Therapy  Roney Mans [facility] 09/04/2012, 1:01 PM

## 2012-09-04 NOTE — Progress Notes (Signed)
Physical Therapy Session Note  Patient Details  Name: Thomas Frye MRN: [id number] Date of Birth: 09-06-1955  Today's Date: 09/04/2012 Time: 0802-0908 Time Calculation (min): 66 min  Short Term Goals: Week 1:  PT Short Term Goal 1 (Week 1): Pt will perform functional transfers with min A PT Short Term Goal 2 (Week 1): Pt will gait in controlled environment 50' with min A PT Short Term Goal 3 (Week 1): Pt will demo intellectual awareness with supervision assist  Skilled Therapeutic Interventions/Progress Updates:   Pt seated EOB when PT entered room. Sit to stand transfers with RW from bed min guard assist.  Gait short distances (no greater than 20' at a time) with RW min assist for balance and stability with WC to follow due to decreased activity tolerance. Pt took three seated rest breaks in WC during gait.  WC mobility flat surfaces supervision x 150'.  Verbal cues to use arms and legs.    Each time he walked he reported that his dizziness got more intense.  Some basic vestibular assessment preformed due to pt reports his sensation is spinning.  He does find some relief in looking at a target to help calm the sensation, but I really did not find any significantly positive findings with basic occulomotor or positional testing.  His BP is certainly orthostatic.  We started at 126/80 sitting EOB in room, and then went down to 89/58 after gait seated on mat table in gym.  In standing BP 83/53.  Standing tolerance trials with RW with supervision: 57.26 sec, 56.71 out of two measured trials.  WC to mat table transfer with RW min guard assist, without RW min assist.  I spoke at length with the pt re: energy conservation and having to pace himself while doing his normal routine at home.  He talked at one point about using his knee walker and I tested his safety awareness by asking him which he thought would be safer to use at home right now the knee walker or the RW and he was with a little hesitance  able to tell me the RW.     Therapy Documentation Precautions:  Precautions Precautions: Fall Required Braces or Orthoses: Other Brace/Splint Other Brace/Splint: cam boot R ankle OOB Restrictions Weight Bearing Restrictions: No Other Position/Activity Restrictions: Has blue PRAFO for in the bed.   Vital Signs: Therapy Vitals BP: 90/50 mmHg Patient Position, if appropriate: Standing     Locomotion : Ambulation Ambulation/Gait Assistance: 4: Min assist Wheelchair Mobility Distance: 150   See FIM for current functional status  Therapy/Group: Individual Therapy  Emalina Dubreuil, Claretta Fraise 09/04/2012, 12:15 PM

## 2012-09-04 NOTE — Progress Notes (Signed)
Physical Therapy Note  Patient Details  Name: Thomas Frye MRN: [id number] Date of Birth: 07-Mar-1956 Today's Date: 09/04/2012  Time: 1000-1055 55 minutes  1:1 no c/o pain.  Pt noted to have symptomatic orthostatic hypotension during last PT session, this session focused on increasing tolerance to standing activities as symptoms would allow.  Standing balance without UE support with UEs reaching all directions, able to tolerate 2 x 3 minutes before onset of symptoms.  Short distance gait training multiple attempts of 20-25' with close supervision-min guard with RW, standing marching with UE support with min guard x 1 minutes before symptoms.  Pt able to tolerate standing longer than in previous session.  Nu step for increased activity tolerance x 8 minutes level 4.  W/c mobility throughout unit for UE strengthening and activity tolerance with mod I, assist for parts management.  Overall pt with decreasing orthostatic symptoms this session, BP checked in seated: 106/68.   Saphira Lahmann 09/04/2012, 10:55 AM

## 2012-09-04 NOTE — Progress Notes (Signed)
Speech Language Pathology Daily Session Note  Patient Details  Name: Thomas Frye MRN: [id number] Date of Birth: 23-May-1955  Today's Date: 09/04/2012 Time: 0900-0930 Time Calculation (min): 30 min  Short Term Goals: Week 1: SLP Short Term Goal 1 (Week 1): Pt will demonstrate sustained attention to task for 30 miutes with supervision verbal cues for redirection SLP Short Term Goal 2 (Week 1): Pt will demonstrate functional problem solving with basic and familiar tasks with Min A verbal and question cues.  SLP Short Term Goal 3 (Week 1): Pt will identify 1 physical and 1 cognitive deficit with Min A semantic and question cues.  SLP Short Term Goal 4 (Week 1): Pt will utilize external memory aids to increase recall/carryover of newly learned information with Min A verbal and question cues.  SLP Short Term Goal 5 (Week 1): Pt will express wants/needs at the sentence level with Min A question cues to self-monitor and correct errors.   Skilled Therapeutic Interventions: Treatment focus on cognitive goals. SLP facilitated session by providing Min A visual and verbal cues for functional problem solving with basic money management tasks. Pt also completed tasks in a timely manner. Pt also participated in 6 step sequencing task with picture cards. Pt required Mod A visual and question cues for sequencing and problem solving with the task.  Pt demonstrated an increased speech rate and required supervision question cues to self-monitor and correct errors.    FIM:  Comprehension Comprehension Mode: Auditory Comprehension: 4-Understands basic 75 - 89% of the time/requires cueing 10 - 24% of the time Expression Expression Mode: Verbal Expression: 4-Expresses basic 75 - 89% of the time/requires cueing 10 - 24% of the time. Needs helper to occlude trach/needs to repeat words. Social Interaction Social Interaction: 5-Interacts appropriately 90% of the time - Needs monitoring or encouragement for  participation or interaction. Problem Solving Problem Solving: 4-Solves basic 75 - 89% of the time/requires cueing 10 - 24% of the time Memory Memory: 4-Recognizes or recalls 75 - 89% of the time/requires cueing 10 - 24% of the time  Pain Pain Assessment Pain Assessment: No/denies pain  Therapy/Group: Individual Therapy  Yoko Mcgahee 09/04/2012, 3:39 PM

## 2012-09-04 NOTE — Progress Notes (Signed)
Inpatient Diabetes Program Recommendations  AACE/ADA: New Consensus Statement on Inpatient Glycemic Control (2013)  Target Ranges:  Prepandial:   less than 140 mg/dL      Peak postprandial:   less than 180 mg/dL (1-2 hours)      Critically ill patients:  140 - 180 mg/dL  Results for THADDIUS, MANES (MRN [id number]) as of 09/04/2012 13:12  Ref. Range 09/03/2012 11:21 09/03/2012 16:33 09/03/2012 20:55 09/04/2012 07:22 09/04/2012 12:05  Glucose-Capillary Latest Range: 70-99 mg/dL 086 (H) 578 (H) 469 (H) 169 (H) 304 (H)   Inpatient Diabetes Program Recommendations Insulin - Basal: . Correction (SSI): . Insulin - Meal Coverage: Increase Novolog meal coverage to 6 units with meals  Thank you  Piedad Climes BSN, RN,CDE Inpatient Diabetes Coordinator [phone] (team pager)

## 2012-09-04 NOTE — Progress Notes (Signed)
Patient ID: Thomas Frye, male   DOB: October 30, 1955, 57 y.o.   MRN: [id number] Subjective/Complaints: BPs down today, CBGs improved , no pain c/os no dizziness A 12 point review of systems has been performed and if not noted above is otherwise negative.   Objective: Vital Signs: Blood pressure 83/53, pulse 97, temperature 98.2 F (36.8 C), temperature source Oral, resp. rate 17, height 6\' 4"  (1.93 m), weight 129.2 kg (284 lb 13.4 oz), SpO2 93.00%. No results found.  Recent Labs  09/04/12 0520  WBC 6.8  HGB 11.7*  HCT 35.4*  PLT 368    Recent Labs  09/04/12 0520  NA 139  K 4.1  CL 99  GLUCOSE 158*  BUN 15  CREATININE 0.88  CALCIUM 9.7   CBG (last 3)   Recent Labs  09/03/12 1633 09/03/12 2055 09/04/12 0722  GLUCAP 152* 140* 169*    Wt Readings from Last 3 Encounters:  08/29/12 129.2 kg (284 lb 13.4 oz)  08/22/12 126.4 kg (278 lb 10.6 oz)  12/10/10 129.729 kg (286 lb)    Physical Exam:  Constitutional: He appears well-developed and well-nourished.  HENT:  Head: Normocephalic and atraumatic.  Right Ear: External ear normal.  Left Ear: External ear normal.  Eyes: Conjunctivae and EOM are normal. Pupils are equal, round, and reactive to light.  Neck: Normal range of motion. No JVD present. No tracheal deviation present. No thyromegaly present.  Cardiovascular: Normal rate and regular rhythm.  Pulmonary/Chest: Effort normal and breath sounds normal. No respiratory distress. He has no wheezes. He has no rales.  Abdominal: Soft. Bowel sounds are normal. He exhibits no distension. There is no tenderness.  Musculoskeletal: He exhibits no edema.  Tends to keep RLE externally rotated. 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 Right lateral foot with 2 cm stage II ulcer along 5th MT--clean. Left toes with loss of toe nails.  Lymphadenopathy:  He has no cervical adenopathy.  Neurological: He is alert. He displays abnormal reflex. Coordination  abnormal.  Flat affect but no signs of distress. Makes eye contact. Slow measured speech with hesitations and expressive deficits. Decreased initiation and delayed processing. Able to follow simple one step commands but occasional perseveration noted. Oriented to self and to rehab hospital. .  Unable to do simple math or state months of the year without cues. Mild left inattention? Poor awareness of deficits. UES grossly 4/5. LE HF is 2-3/5 and is grossly 4/5 distally. RLE limited by wound/boot. Stocking glove sensory loss below the knees.  Skin: Skin is warm and dry.  Psychiatric: Judgment and thought content normal. His affect is blunt. His speech is delayed. He is slowed. He exhibits abnormal recent memory and abnormal remote memory   Assessment/Plan: 1. 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 Physiatrist is providing close team supervision and 24 hour management of active medical problems listed below. Physiatrist and rehab team continue to assess barriers to discharge/monitor patient progress toward functional and medical goals. Team conference see notes FIM: FIM - Bathing Bathing Steps Patient Completed: Chest;Right Arm;Left Arm;Abdomen;Front perineal area;Right upper leg;Left upper leg;Left lower leg (including foot) Bathing: 4: Min-Patient completes 8-9 27f 10 parts or 75+ percent  FIM - Upper Body Dressing/Undressing Upper body dressing/undressing steps patient completed: Thread/unthread right sleeve of pullover shirt/dresss;Thread/unthread left sleeve of pullover shirt/dress;Put head through opening of pull over shirt/dress;Pull shirt over trunk Upper body dressing/undressing: 5:  Set-up assist to: Obtain clothing/put away FIM - Lower Body Dressing/Undressing Lower body dressing/undressing steps patient completed: Pull underwear up/down;Thread/unthread right pants leg;Thread/unthread  left pants leg;Pull pants up/down;Don/Doff left sock;Don/Doff left shoe Lower body dressing/undressing: 4: Min-Patient completed 75 plus % of tasks  FIM - Toileting Toileting steps completed by patient: Adjust clothing prior to toileting;Performs perineal hygiene;Adjust clothing after toileting Toileting: 4: Steadying assist  FIM - Archivist Transfers: 0-Activity did not occur  FIM - Banker Devices: Therapist, occupational: 4: Supine > Sit: Min A (steadying Pt. > 75%/lift 1 leg);3: Sit > Supine: Mod A (lifting assist/Pt. 50-74%/lift 2 legs);4: Bed > Chair or W/C: Min A (steadying Pt. > 75%);4: Chair or W/C > Bed: Min A (steadying Pt. > 75%)  FIM - Locomotion: Wheelchair Distance: 50 Locomotion: Wheelchair: 5: Travels 150 ft or more: maneuvers on rugs and over door sills with supervision, cueing or coaxing FIM - Locomotion: Ambulation Ambulation/Gait Assistance: 1: +2 Total assist Locomotion: Ambulation: 1: Travels less than 50 ft with minimal assistance (Pt.>75%)  Comprehension Comprehension Mode: Auditory Comprehension: 4-Understands basic 75 - 89% of the time/requires cueing 10 - 24% of the time  Expression Expression Mode: Verbal Expression: 4-Expresses basic 75 - 89% of the time/requires cueing 10 - 24% of the time. Needs helper to occlude trach/needs to repeat words.  Social Interaction Social Interaction: 5-Interacts appropriately 90% of the time - Needs monitoring or encouragement for participation or interaction.  Problem Solving Problem Solving: 3-Solves basic 50 - 74% of the time/requires cueing 25 - 49% of the time  Memory Memory: 3-Recognizes or recalls 50 - 74% of the time/requires cueing 25 - 49% of the time  Medical Problem List and Plan:  1. DVT Prophylaxis/Anticoagulation: Pharmaceutical: Lovenox  2. Pain Management: N/A  3. Mood: No signs of distress noted and patient expressing need to get better.Will  have LCSW and psychologist follow up for evaluation and support. Provide ego support. Continue lexapro  4. Neuropsych: This patient is not capable of making decisions on his own behalf.  5. DM type 2 with peripheral neuropathy:  . Po intake remains poor Monitor BS with AC./HS cbg checks. Adjust levemir with novolog for meal coverage.  6. Chronic neurogenic right foot ulcer: Pressure relief measures when in bed--CAM boot out of bed. Continue Cipro and doxycycline indefinitely due to chronic infection per wife. Plans for graft in the future? 7. Urinary retention: Another voiding trial today, cannot tolerate flomax with lisinopril 10mg , will reduce lisinopril to 2.5mg  and retry flomax, trial urecholine, monitor orthostasis 8. Bowel incontinence: May need probiotic. Will monitor and tract to see if bowel program needed. 9. FEN: poor appetite/intake  -RD follow up  -megace trial  LOS (Days) 6 A FACE TO FACE EVALUATION WAS PERFORMED  KIRSTEINS,ANDREW E 09/04/2012 9:21 AM

## 2012-09-04 NOTE — Patient Care Conference (Signed)
Inpatient RehabilitationTeam Conference and Plan of Care Update Date: 09/04/2012   Time: 2:45 PM    Patient Name: Thomas Frye      Medical Record Number: [id number]  Date of Birth: 20-Aug-1955 Sex: Male         Room/Bed: 4001/4001-01 Payor Info: Payor: BLUE CROSS BLUE SHIELD / Plan: BCBS [place] PPO / Product Type: *No Product type* /    Admitting Diagnosis: Anoxic BI  Admit Date/Time:  08/29/2012  5:30 PM Admission Comments: No comment available   Primary Diagnosis:  Anoxic encephalopathy Principal Problem: Anoxic encephalopathy  Patient Active Problem List   Diagnosis Date Noted  . Anoxic encephalopathy 08/30/2012  . ? 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/2012  . 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 Acute respiratory failure in setting of profound hypoglycemia 08/18/2012  . Severe diabetic hypoglycemia 08/18/2012  . DM (diabetes mellitus), type 2 with peripheral vascular complications 08/18/2012  . Acute encephalopathy 08/18/2012  . HTN (hypertension) 08/18/2012  . Noncompliance 08/18/2012    Expected Discharge Date: Expected Discharge Date: 09/13/12  Team Members Present: Physician leading conference: Dr. Claudette Laws Social Worker Present: Amada Jupiter, LCSW Nurse Present: Carmie End, RN PT Present: Reggy Eye, PT OT Present: Mackie Pai, OT;Ardis Rowan, COTA SLP Present: Feliberto Gottron, SLP Other (Discipline and Name): Tora Duck, PPS Coordinator and Ottie Glazier, RN     Current Status/Progress Goal Weekly Team Focus  Medical   urinary retention, poor diabetic control, orthostatic hypotension  improve bladder emptying  medication management   Bowel/Bladder   Continent bowel and bladder  mod assist      Swallow/Nutrition/ Hydration             ADL's   min A overall   supervision  balance, balance reactions, activity tolerance   Mobility   min A  supervision  balance, activity tolerance    Communication   Min A-Supervision  Supervision  self-monitor and correct errors    Safety/Cognition/ Behavioral Observations  min A  supervision  problem solving, working memory   Pain   5-10mg  oxy IR Q 4hrs    pain goal 2      Skin   Back incision with /2 steris /  skin tags all over bodywith dry skin/eucerin cream to feet  no new skin breakdown       Rehab Goals Patient on target to meet rehab goals: Yes *See Care Plan and progress notes for long and short-term goals.  Barriers to Discharge: see above    Possible Resolutions to Barriers:  see above    Discharge Planning/Teaching Needs:  home with family to provide 24/7 supervision      Team Discussion:  Making good gains with overall cognition.  Poor DM control.  Decrease in BP with flomax.  Still recommending 24/7 supervsion  Revisions to Treatment Plan:  None   Continued Need for Acute Rehabilitation Level of Care: The patient requires daily medical management by a physician with specialized training in physical medicine and rehabilitation for the following conditions: Daily direction of a multidisciplinary physical rehabilitation program to ensure safe treatment while eliciting the highest outcome that is of practical value to the patient.: Yes Daily medical management of patient stability for increased activity during participation in an intensive rehabilitation regime.: Yes Daily analysis of laboratory values and/or radiology reports with any subsequent need for medication adjustment of medical intervention for : Neurological problems;Other  Thomas Frye 09/04/2012, 4:45 PM

## 2012-09-05 ENCOUNTER — Inpatient Hospital Stay (HOSPITAL_COMMUNITY): Payer: BC Managed Care – PPO | Admitting: Physical Therapy

## 2012-09-05 ENCOUNTER — Inpatient Hospital Stay (HOSPITAL_COMMUNITY): Payer: BC Managed Care – PPO

## 2012-09-05 ENCOUNTER — Inpatient Hospital Stay (HOSPITAL_COMMUNITY): Payer: BC Managed Care – PPO | Admitting: Speech Pathology

## 2012-09-05 ENCOUNTER — Inpatient Hospital Stay (HOSPITAL_COMMUNITY): Payer: BC Managed Care – PPO | Admitting: Occupational Therapy

## 2012-09-05 LAB — CREATININE, SERUM
Creatinine, Ser: 0.94 mg/dL (ref 0.50–1.35)
GFR calc non Af Amer: >90 mL/min (ref >90)

## 2012-09-05 LAB — GLUCOSE, CAPILLARY: Glucose-Capillary: 191 mg/dL — ABNORMAL HIGH (ref 70–99)

## 2012-09-05 MED ORDER — INSULIN DETEMIR 100 UNIT/ML ~~LOC~~ SOLN
25.0000 [IU] | Freq: Every day | SUBCUTANEOUS | Status: DC
  Administered 2012-09-05 – 2012-09-08 (×4): 25 [IU] via SUBCUTANEOUS
  Filled 2012-09-05 (×6): qty 0.25

## 2012-09-05 MED ORDER — INSULIN ASPART 100 UNIT/ML ~~LOC~~ SOLN
4.0000 [IU] | Freq: Three times a day (TID) | SUBCUTANEOUS | Status: DC
  Administered 2012-09-05 – 2012-09-06 (×5): 4 [IU] via SUBCUTANEOUS

## 2012-09-05 NOTE — Progress Notes (Signed)
Speech Language Pathology Daily Session Note  Patient Details  Name: Thomas Frye MRN: [id number] Date of Birth: 1955-07-16  Today's Date: 09/05/2012 Time: 1015-1100 Time Calculation (min): 45 min  Short Term Goals: Week 1: SLP Short Term Goal 1 (Week 1): Pt will demonstrate sustained attention to task for 30 miutes with supervision verbal cues for redirection SLP Short Term Goal 2 (Week 1): Pt will demonstrate functional problem solving with basic and familiar tasks with Min A verbal and question cues.  SLP Short Term Goal 3 (Week 1): Pt will identify 1 physical and 1 cognitive deficit with Min A semantic and question cues.  SLP Short Term Goal 4 (Week 1): Pt will utilize external memory aids to increase recall/carryover of newly learned information with Min A verbal and question cues.  SLP Short Term Goal 5 (Week 1): Pt will express wants/needs at the sentence level with Min A question cues to self-monitor and correct errors.   Skilled Therapeutic Interventions: Treatment focus on cognitive-linguistic goals.  SLP facilitated session by providing Min A semantic cues for naming task when given specific category. Pt also required supervision question cues to self-monitor and correct written errors throughout the task.  Pt's wife present throughout the session and provided appropriate cues.    FIM:  Comprehension Comprehension Mode: Auditory Comprehension: 5-Follows basic conversation/direction: With extra time/assistive device Expression Expression Mode: Verbal Expression: 4-Expresses basic 75 - 89% of the time/requires cueing 10 - 24% of the time. Needs helper to occlude trach/needs to repeat words. Social Interaction Social Interaction: 5-Interacts appropriately 90% of the time - Needs monitoring or encouragement for participation or interaction. Problem Solving Problem Solving: 4-Solves basic 75 - 89% of the time/requires cueing 10 - 24% of the time Memory Memory: 4-Recognizes or  recalls 75 - 89% of the time/requires cueing 10 - 24% of the time  Pain No/Denies Pain  Therapy/Group: Individual Therapy  [name] 09/05/2012, 3:50 PM

## 2012-09-05 NOTE — Progress Notes (Signed)
Physical Therapy Note  Patient Details  Name: Thomas WYMORE MRN: [id number] Date of Birth: 24-Apr-1955 Today's Date: 09/05/2012  Time: 900-955 55 minutes  1:1 No c/o pain.  Pt still with symptomatic orthostatic hypotension.  Pt able to stand 1-2 minutes before requiring seated rest due to dizziness.  Treatment focused on increasing standing tolerance with multiple sit to stands and standing tolerance with B UE support and without UE support while playing putting golf game.  Pt able to stand with min guard-close supervision for golf activity, requires frequent seated rest breaks due to dizziness. w /c mobility throughout unit for activity tolerance with supervision-mod I.  Pt improving balance but limited by dizzy symptoms.   Makira Holleman 09/05/2012, 9:54 AM

## 2012-09-05 NOTE — Progress Notes (Signed)
Occupational Therapy Session Note  Patient Details  Name: Thomas Frye MRN: [id number] Date of Birth: October 20, 1955  Today's Date: 09/05/2012 Time: 0800-0857 Time Calculation (min): 57 min  Short Term Goals: Week 2:     Skilled Therapeutic Interventions/Progress Updates:    Pt seated on toilet upon arrival and amb with [facility] to [facility] to take shower.  Pt completed all bathing tasks with supervision only.  Pt transferred to w/c with squat pivot transfer to roll into room to complete dressing from w/c.  RN notified to redress right foot.  Pt completed dressing with steady A only when standing to pull up pants.  Pt is very deliberate but intentional with all tasks.  Pt required extra time to complete all tasks.  Focus on activity tolerance, standing balance, and safety awareness.  Pt did not c/o of dizziness during session.  Therapy Documentation Precautions:  Precautions Precautions: Fall Required Braces or Orthoses: Other Brace/Splint Other Brace/Splint: cam boot R ankle OOB Restrictions Weight Bearing Restrictions: No Other Position/Activity Restrictions: Has blue PRAFO for in the bed.   Pain: Pain Assessment Pain Assessment: No/denies pain  See FIM for current functional status  Therapy/Group: Individual Therapy  Rich Brave 09/05/2012, 9:02 AM

## 2012-09-05 NOTE — Progress Notes (Signed)
Pt. Stated that he would place himself on CPAP before going to bed. Pt. Was made aware to call RT if he needed assistance with CPAP anytime during the night. Pt. Has his home CPAP & mask.  

## 2012-09-05 NOTE — Progress Notes (Signed)
Occupational Therapy Session Note  Patient Details  Name: RUSTY VILLELLA MRN: [id number] Date of Birth: [birth year]  Today's Date: 09/05/2012 Time: 1400-1430 Time Calculation (min): 30 min   Skilled Therapeutic Interventions/Progress Updates:    1:1 Neuromuscular reeducation with focus on standing balance with transitional movements, core strengthening, weight shifting outside of BOS with functional activity, squats<>standing with control and activity tolerance. Pt with improved standing tolerance with no c/o dizziness or lightheadedness.   Therapy Documentation Precautions:  Precautions Precautions: Fall Required Braces or Orthoses: Other Brace/Splint Other Brace/Splint: cam boot R ankle OOB Restrictions Weight Bearing Restrictions: No Other Position/Activity Restrictions: Has blue PRAFO for in the bed.   Pain:  no c/o pain  See FIM for current functional status  Therapy/Group: Individual Therapy  Roney Mans [facility] 09/05/2012, 3:52 PM

## 2012-09-05 NOTE — Progress Notes (Signed)
Social Work Patient ID: Thomas Frye, male   DOB: [birth year], 58 y.o.   MRN: [id number]  Met today with patient and wife to review team conference.  Both aware and agreeable with targeted d/c date of 09/13/12 with supervision goals.  Wife has busy week with move into a new apt which is ground level and better accessibility.  Notes family will be able to provide recommended supervision as well as transportation to OP tx - will arrange.  Wife remains very involved and supportive.    Ohanna Gassert, LCSW

## 2012-09-05 NOTE — Progress Notes (Signed)
Patient ID: Thomas Frye, male   DOB: 1955/11/07, 57 y.o.   MRN: [id number] Subjective/Complaints: BPs still low A 12 point review of systems has been performed and if not noted above is otherwise negative.   Objective: Vital Signs: Blood pressure 160/80, pulse 77, temperature 98 F (36.7 C), temperature source Oral, resp. rate 19, height 6\' 4"  (1.93 m), weight 129.2 kg (284 lb 13.4 oz), SpO2 97.00%. No results found.  Recent Labs  09/04/12 0520  WBC 6.8  HGB 11.7*  HCT 35.4*  PLT 368    Recent Labs  09/04/12 0520 09/05/12 0608  NA 139  --   K 4.1  --   CL 99  --   GLUCOSE 158*  --   BUN 15  --   CREATININE 0.88 0.94  CALCIUM 9.7  --    CBG (last 3)   Recent Labs  09/04/12 1614 09/04/12 2104 09/05/12 0734  GLUCAP 198* 180* 189*    Wt Readings from Last 3 Encounters:  08/29/12 129.2 kg (284 lb 13.4 oz)  08/22/12 126.4 kg (278 lb 10.6 oz)  12/10/10 129.729 kg (286 lb)    Physical Exam:  Constitutional: He appears well-developed and well-nourished.  HENT:  Head: Normocephalic and atraumatic.  Right Ear: External ear normal.  Left Ear: External ear normal.  Eyes: Conjunctivae and EOM are normal. Pupils are equal, round, and reactive to light.  Neck: Normal range of motion. No JVD present. No tracheal deviation present. No thyromegaly present.  Cardiovascular: Normal rate and regular rhythm.  Pulmonary/Chest: Effort normal and breath sounds normal. No respiratory distress. He has no wheezes. He has no rales.  Abdominal: Soft. Bowel sounds are normal. He exhibits no distension. There is no tenderness.  Musculoskeletal: He exhibits no edema.    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 Right lateral foot with 2 cm stage II ulcer along 5th MT--clean. Left toes with loss of toe nails.  Lymphadenopathy:  He has no cervical adenopathy.  Neurological: He is alert. He displays abnormal reflex. Coordination abnormal.  Flat affect but no signs  of distress. Makes eye contact. Slow measured speech with hesitations and expressive deficits. Decreased initiation and delayed processing. Able to follow simple one step commands but occasional perseveration noted. Oriented to self and to rehab hospital. .     UES grossly 4/5. LE HF is 2-3/5 and is grossly 4/5 distally. RLE limited by wound/boot. Stocking glove sensory loss below the knees.  Skin: Skin is warm and dry.  Psychiatric: Judgment and thought content normal. His affect is blunt. His speech is delayed. He is slowed. He exhibits abnormal recent memory and abnormal remote memory   Assessment/Plan: 1. 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 Physiatrist is providing close team supervision and 24 hour management of active medical problems listed below. Physiatrist and rehab team continue to assess barriers to discharge/monitor patient progress toward functional and medical goals. Team conference see notes FIM: FIM - Bathing Bathing Steps Patient Completed: Chest;Right Arm;Left Arm;Abdomen;Front perineal area;Right upper leg;Left upper leg;Left lower leg (including foot) Bathing: 4: Min-Patient completes 8-9 65f 10 parts or 75+ percent  FIM - Upper Body Dressing/Undressing Upper body dressing/undressing steps patient completed: Thread/unthread right sleeve of pullover shirt/dresss;Thread/unthread left sleeve of pullover shirt/dress;Put head through opening of pull over shirt/dress;Pull shirt over trunk Upper body dressing/undressing: 5: Set-up assist to: Obtain clothing/put away FIM -  Lower Body Dressing/Undressing Lower body dressing/undressing steps patient completed: Pull underwear up/down;Thread/unthread right pants leg;Thread/unthread left pants leg;Pull pants up/down;Don/Doff left sock;Don/Doff left shoe Lower body dressing/undressing: 4: Min-Patient completed 75 plus % of  tasks  FIM - Toileting Toileting steps completed by patient: Adjust clothing prior to toileting;Adjust clothing after toileting Toileting: 3: Mod-Patient completed 2 of 3 steps  FIM - Archivist Transfers: 0-Activity did not occur  FIM - Banker Devices: Therapist, occupational: 4: Bed > Chair or W/C: Min A (steadying Pt. > 75%);4: Chair or W/C > Bed: Min A (steadying Pt. > 75%)  FIM - Locomotion: Wheelchair Distance: 150 Locomotion: Wheelchair: 5: Travels 150 ft or more: maneuvers on rugs and over door sills with supervision, cueing or coaxing FIM - Locomotion: Ambulation Locomotion: Ambulation Assistive Devices: Designer, industrial/product Ambulation/Gait Assistance: 4: Min assist Locomotion: Ambulation: 1: Travels less than 50 ft with minimal assistance (Pt.>75%)  Comprehension Comprehension Mode: Auditory Comprehension: 4-Understands basic 75 - 89% of the time/requires cueing 10 - 24% of the time  Expression Expression Mode: Verbal Expression: 4-Expresses basic 75 - 89% of the time/requires cueing 10 - 24% of the time. Needs helper to occlude trach/needs to repeat words.  Social Interaction Social Interaction: 5-Interacts appropriately 90% of the time - Needs monitoring or encouragement for participation or interaction.  Problem Solving Problem Solving: 4-Solves basic 75 - 89% of the time/requires cueing 10 - 24% of the time  Memory Memory: 4-Recognizes or recalls 75 - 89% of the time/requires cueing 10 - 24% of the time  Medical Problem List and Plan:  1. DVT Prophylaxis/Anticoagulation: Pharmaceutical: Lovenox  2. Pain Management: N/A  3. Mood: No signs of distress noted and patient expressing need to get better.Will have LCSW and psychologist follow up for evaluation and support. Provide ego support. Continue lexapro  4. Neuropsych: This patient is not capable of making decisions on his own behalf.  5. DM type 2 with  peripheral neuropathy:  . Po intake improved. Monitor BS with AC./HS cbg checks. Adjusting levemir with novolog for meal coverage.  6. Chronic neurogenic right foot ulcer: Pressure relief measures when in bed--CAM boot out of bed. Continue Cipro and doxycycline indefinitely due to chronic infection per wife. Plans for graft in the future? 7. Urinary retention:  Improving--off meds 8. Bowel incontinence:  Probiotic. Marland Kitchen 9. FEN: app much better  -RD follow up  -megace trial 10. Hypotension--encourage fluids, stopped lisinopril---denies symptoms  LOS (Days) 7 A FACE TO FACE EVALUATION WAS PERFORMED  Rayetta Veith T 09/05/2012 7:58 AM

## 2012-09-06 ENCOUNTER — Encounter (HOSPITAL_COMMUNITY): Payer: BC Managed Care – PPO | Admitting: Occupational Therapy

## 2012-09-06 ENCOUNTER — Inpatient Hospital Stay (HOSPITAL_COMMUNITY): Payer: BC Managed Care – PPO | Admitting: Physical Therapy

## 2012-09-06 ENCOUNTER — Inpatient Hospital Stay (HOSPITAL_COMMUNITY): Payer: BC Managed Care – PPO | Admitting: Occupational Therapy

## 2012-09-06 ENCOUNTER — Inpatient Hospital Stay (HOSPITAL_COMMUNITY): Payer: BC Managed Care – PPO | Admitting: *Deleted

## 2012-09-06 ENCOUNTER — Inpatient Hospital Stay (HOSPITAL_COMMUNITY): Payer: BC Managed Care – PPO | Admitting: Speech Pathology

## 2012-09-06 LAB — GLUCOSE, CAPILLARY: Glucose-Capillary: 159 mg/dL — ABNORMAL HIGH (ref 70–99)

## 2012-09-06 MED ORDER — NON FORMULARY: 0.6000 mg | Freq: Every day | Status: DC

## 2012-09-06 MED ORDER — LIRAGLUTIDE 18 MG/3ML ~~LOC~~ SOPN
0.6000 mg | PEN_INJECTOR | Freq: Every day | SUBCUTANEOUS | Status: DC
  Administered 2012-09-07 – 2012-09-10 (×4): 0.6 mg via SUBCUTANEOUS
  Administered 2012-09-11: 1.2 [IU] via SUBCUTANEOUS
  Administered 2012-09-12: 0.6 mg via SUBCUTANEOUS

## 2012-09-06 NOTE — Progress Notes (Signed)
Occupational Therapy Session Note  Patient Details  Name: Thomas Frye MRN: [id number] Date of Birth: 1955/08/23  Today's Date: 09/06/2012 Time: 1419-1500 Time Calculation (min): 41 min  Skilled Therapeutic Interventions/Progress Updates:    Pt practiced walk-in shower transfers and tub/shower transfers.  Pt overall min assist for both transfers with use of the RW for support.  Able to utilize a tub bench for the tub transfer and min instructional cueing for technique.  For the walk-in shower utilized a smaller shower seat, which pt also has at home, and the RW.  Pt backed into the shower to get in and walked forward to get out, stepping over 4-5 inch barrier on the way.  Also worked on static standing balance in the gym with pt standing unsupported and performing small head movements and weightshifts.  Pt with LOB during these tasks posteriorly requiring min to mod assist to correct.     Therapy Documentation Precautions:  Precautions Precautions: Fall Required Braces or Orthoses: Other Brace/Splint Other Brace/Splint: cam boot R ankle OOB Restrictions Weight Bearing Restrictions: No Other Position/Activity Restrictions: Has blue PRAFO for in the bed.    Vital Signs: Therapy Vitals Temp: 98.3 F (36.8 C) Temp src: Oral Pulse Rate: 78 Resp: 18 BP: 134/82 mmHg Patient Position, if appropriate: Sitting Oxygen Therapy SpO2: 98 % O2 Device: None (Room air) Pain: Pain Assessment Pain Assessment: No/denies pain  See FIM for current functional status  Therapy/Group: Individual Therapy  [name] OTR/L 09/06/2012, 4:13 PM

## 2012-09-06 NOTE — Progress Notes (Signed)
Occupational Therapy daily and  Weekly Progress Note  Patient Details  Name: Thomas Frye MRN: [id number] Date of Birth: 1956/01/12  Today's Date: 09/06/2012 Time: 4098-1191 Time Calculation (min): 60 min  1:1 self care retraining at shower level with focus on performing functional ambulation around room to obtain clothing and other items needs for shower, clothing management in standing without UE support, standing balance, continued practice with donning and doffing CAM boot and d/c planning.  Patient has met 5 of 5 short term goals.  Pt continues to make good gains in therapy towards his LTG of overall supervision. Pt has made improvements in standing balance (despite the CAM boot), sit to stands, basic transfers, activity tolerance, memory, task organization and sequencing. Pt can perform basic ADL tasks and mobility with min A to supervision.  Patient continues to demonstrate the following deficits: muscle weakness, decreased cardiorespiratoy endurance, decreased memory and delayed processing and decreased standing balance and decreased balance strategies and therefore will continue to benefit from skilled OT intervention to enhance overall performance with BADL and Reduce care partner burden.  Patient progressing toward long term goals..  Continue plan of care.  OT Short Term Goals Week 1:  OT Short Term Goal 1 (Week 1): Pt would transfer to toilet/ BSC with steady A consistently  OT Short Term Goal 1 - Progress (Week 1): Met OT Short Term Goal 2 (Week 1): Pt will maintain standing balance with min A for clothing management  OT Short Term Goal 2 - Progress (Week 1): Met OT Short Term Goal 3 (Week 1): Pt will pick out clothing in prep for dressing with supervision OT Short Term Goal 3 - Progress (Week 1): Met OT Short Term Goal 4 (Week 1): Pt will perform grooming with setup  OT Short Term Goal 4 - Progress (Week 1): Met OT Short Term Goal 5 (Week 1): Pt will demonstrate improved  righting reactions with LOB requiring only min A to correct  balance OT Short Term Goal 5 - Progress (Week 1): Met Week 2:  OT Short Term Goal 1 (Week 2): LTG= STG  Skilled Therapeutic Interventions/Progress Updates:   continue with POC   Therapy Documentation Precautions:  Precautions Precautions: Fall Required Braces or Orthoses: Other Brace/Splint Other Brace/Splint: cam boot R ankle OOB Restrictions Weight Bearing Restrictions: No Other Position/Activity Restrictions: Has blue PRAFO for in the bed.   Pain: No c/o pain See FIM for current functional status  Therapy/Group: Individual Therapy  Roney Mans [facility] 09/06/2012, 2:26 PM

## 2012-09-06 NOTE — Evaluation (Signed)
Recreational Therapy Assessment and Plan  Patient Details  Name: Thomas Frye MRN: [id number] Date of Birth: January 10, 1956 Today's Date: 09/06/2012  Rehab Potential: Good ELOS: 2 weeks   Assessment Clinical Impression:Problem List:  Patient Active Problem List    Diagnosis  Date Noted   .  Anoxic encephalopathy  08/30/2012   .  ? Thalamic infarction  08/23/2012   .  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, limited to breakdown of skin  08/19/2012   .  Acute respiratory failure in setting of profound hypoglycemia  08/18/2012   .  Severe diabetic hypoglycemia  08/18/2012   .  DM (diabetes mellitus), type 2 with peripheral vascular complications  08/18/2012   .  Acute encephalopathy  08/18/2012   .  HTN (hypertension)  08/18/2012   .  Noncompliance  08/18/2012    Past Medical History:  Past Medical History   Diagnosis  Date   .  Diabetes mellitus    .  Hypertension    .  Depression    .  GERD (gastroesophageal reflux disease)    .  H/O hiatal hernia    .  Neuromuscular disorder      neuropathy   .  Sleep apnea      wears cpap at night    Past Surgical History:  Past Surgical History   Procedure  Laterality  Date   .  Toe amputation     .  Eye surgery     .  No past surgeries     .  Fracture surgery       Questionable steel screws to right tibia    Assessment & Plan  Clinical Impression: Patient is a 57 y.o. year old male [name] with history of DM with diabetic foot ulcer, OSA, depression with suicide attempt in the past; who was found in a parked care on 08/18/12. He was unresponsive with agonal respirations, posturing and profoundly hypoglycemic. He was intubated in the field and treated with dextrose but remained minimally responsive. 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 Neurology consulted and felt patient with severe hypoglycemic encephalopathy with question of seizure activity. He was started on IV antibiotics due to cellulitis and question of  aspiration PNA. He was extubated on 05/27 and psychiatry consulted due to concerns of insulin overdose/suicide attempt. He felt that patient with poor insight, judgement and impulse control and initially recommended inpatient acute psychiatric hospitalization once medically cleared. Mentation slowly improving with decrease in lethargy and improvement in ability to follow basic commands and sustain attention to tasks. He was cleared by psychiatry to resume antidepressants as no SI noted reported. Neurology recommended ASA and statin due to findings of old infarct on CCT. Patient transferred to CIR on 08/29/2012.  Pt presents with decreased activity tolerance, decreased functional mobility, decreased balance, decreased attention, decreased safety, decreased awareness, decreased attention Limiting pt's independence with leisure/community pursuits.  Leisure History/Participation Premorbid leisure interest/current participation: Garment/textile technologist - Journalist, newspaper - Press photographer - Travel (Comment);Sports - Exercise (Comment);Sports - Golf;Sports - Swimming Other Leisure Interests: Reading;Chief Executive Officer Resources: Good-identify 3 post discharge leisure resources Psychosocial / Spiritual Social interaction - Mood/Behavior: Cooperative Firefighter Appropriate for Education?: Yes Patient Agreeable to [employer]?: Yes Recreational Therapy Orientation Orientation -Reviewed with patient: Available activity resources Strengths/Weaknesses Patient Strengths/Abilities: Willingness to participate;Active premorbidly Patient weaknesses: Physical limitations  Plan Rec Therapy Plan Is patient appropriate for Therapeutic Recreation?: Yes Rehab Potential: Good Treatment times per week: Min 1 time per  week >20 min Estimated Length of Stay: 2 weeks TR Treatment/Interventions: Adaptive equipment instruction;1:1 session;Balance/vestibular training;Functional mobility  training;Community reintegration;Cognitive remediation/compensation;Patient/family education;Therapeutic activities;Recreation/leisure participation;Therapeutic exercise  Recommendations for other services: None  Discharge Criteria: Patient will be discharged from TR if patient refuses treatment 3 consecutive times without medical reason.  If treatment goals not met, if there is a change in medical status, if patient makes no progress towards goals or if patient is discharged from hospital.  The above assessment, treatment plan, treatment alternatives and goals were discussed and mutually agreed upon: by patient  Thomas Frye 09/06/2012, 8:46 AM

## 2012-09-06 NOTE — Progress Notes (Signed)
Speech Language Pathology Weekly Progress & Session Notes  Patient Details  Name: Thomas Frye MRN: [id number] Date of Birth: 1955-08-21  Today's Date: 09/06/2012 Time: 0915-1000 Time Calculation (min): 45 min  Short Term Goals: Week 1: SLP Short Term Goal 1 (Week 1): Pt will demonstrate sustained attention to task for 30 miutes with supervision verbal cues for redirection SLP Short Term Goal 1 - Progress (Week 1): Met SLP Short Term Goal 2 (Week 1): Pt will demonstrate functional problem solving with basic and familiar tasks with Min A verbal and question cues.  SLP Short Term Goal 2 - Progress (Week 1): Met SLP Short Term Goal 3 (Week 1): Pt will identify 1 physical and 1 cognitive deficit with Min A semantic and question cues.  SLP Short Term Goal 3 - Progress (Week 1): Met SLP Short Term Goal 4 (Week 1): Pt will utilize external memory aids to increase recall/carryover of newly learned information with Min A verbal and question cues.  SLP Short Term Goal 4 - Progress (Week 1): Met SLP Short Term Goal 5 (Week 1): Pt will express wants/needs at the sentence level with Min A question cues to self-monitor and correct errors.  SLP Short Term Goal 5 - Progress (Week 1): Met  New Short Term Goals:  Week 2: SLP Short Term Goal 1 (Week 2): Pt will self-monitor and correct verbal errors at the sentence level with supervision verbal and question cues.  SLP Short Term Goal 2 (Week 2): Pt will utilize external memory aids to increase recall/carryover of newly learned information with supervision verbal and question cues.  SLP Short Term Goal 3 (Week 2): Pt will demonstrate functional problem solving with basic and familiar tasks with supervision verbal and question cues.  SLP Short Term Goal 4 (Week 2): Pt will demonstrate selective attention to task for 30 miutes with supervision verbal cues for redirection.   Weekly Progress Updates: Pt has made functional gains and has met 5 of 5 STG's this  reporting period due to improvements in verbal expression, attention, problem solving, awareness and working memory. Currently, pt requires Min-Supervision verbal and question cues to self-monitor and correct verbal errors at the sentence level. The pt's speech is also characterized by delayed prosody and intonation.  Pt also requires Min A for functional problem solving, working memory with utilization of compensatory strategies and emergent awareness. Pt/family education ongoing. Pt would benefit from continued skilled SLP intervention to maximize cognitive recovery and verbal expression prior to discharge home.    SLP Intensity: Minumum of 1-2 x/day, 30 to 90 minutes SLP Frequency: 5 out of 7 days SLP Duration/Estimated Length of Stay: 1 week SLP Treatment/Interventions: Cognitive remediation/compensation;Cueing hierarchy;Environmental controls;Functional tasks;Internal/external aids;Speech/Language facilitation;Patient/family education;Therapeutic Activities  Daily Session Skilled Therapeutic Intervention: Treatment focus on cognitive goals. SLP facilitated session by providing Mod verbal and question cues which faded to Min by the end of the session for problem solving and organization during a new learning, mildly complex task. Pt also required Min A question cues to self-monitor and correct verbal errors throughout the session.  FIM:  Comprehension Comprehension Mode: Auditory Comprehension: 5-Follows basic conversation/direction: With extra time/assistive device Expression Expression Mode: Verbal Expression: 5-Expresses basic 90% of the time/requires cueing < 10% of the time. Social Interaction Social Interaction: 5-Interacts appropriately 90% of the time - Needs monitoring or encouragement for participation or interaction. Problem Solving Problem Solving: 5-Solves basic 90% of the time/requires cueing < 10% of the time Memory Memory: 4-Recognizes or recalls 75 -  89% of the  time/requires cueing 10 - 24% of the time General    Pain No/Denies Pain  Therapy/Group: Individual Therapy  [name] 09/06/2012, 11:06 AM

## 2012-09-06 NOTE — Progress Notes (Signed)
Pt. Has his home CPAP set up at the bedside. Pt. Stated that he would place himself on CPAP before going to bed.  

## 2012-09-06 NOTE — Progress Notes (Signed)
Patient ID: Thomas Frye, male   DOB: 1955/11/05, 57 y.o.   MRN: [id number] Subjective/Complaints: BPs didn't drop so much. However, they're high in the am.  A 12 point review of systems has been performed and if not noted above is otherwise negative.   Objective: Vital Signs: Blood pressure 172/93, pulse 82, temperature 98.1 F (36.7 C), temperature source Oral, resp. rate 18, height 6\' 4"  (1.93 m), weight 124.1 kg (273 lb 9.5 oz), SpO2 94.00%. No results found.  Recent Labs  09/04/12 0520  WBC 6.8  HGB 11.7*  HCT 35.4*  PLT 368    Recent Labs  09/04/12 0520 09/05/12 0608  NA 139  --   K 4.1  --   CL 99  --   GLUCOSE 158*  --   BUN 15  --   CREATININE 0.88 0.94  CALCIUM 9.7  --    CBG (last 3)   Recent Labs  09/05/12 1116 09/05/12 1649 09/06/12 0721  GLUCAP 290* 191* 158*    Wt Readings from Last 3 Encounters:  09/05/12 124.1 kg (273 lb 9.5 oz)  08/22/12 126.4 kg (278 lb 10.6 oz)  12/10/10 129.729 kg (286 lb)    Physical Exam:  Constitutional: He appears well-developed and well-nourished.  HENT:  Head: Normocephalic and atraumatic.  Right Ear: External ear normal.  Left Ear: External ear normal.  Eyes: Conjunctivae and EOM are normal. Pupils are equal, round, and reactive to light.  Neck: Normal range of motion. No JVD present. No tracheal deviation present. No thyromegaly present.  Cardiovascular: Normal rate and regular rhythm.  Pulmonary/Chest: Effort normal and breath sounds normal. No respiratory distress. He has no wheezes. He has no rales.  Abdominal: Soft. Bowel sounds are normal. He exhibits no distension. There is no tenderness.  Musculoskeletal: He exhibits no edema.    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 Right lateral foot with 2 cm stage II ulcer along 5th MT--clean. Left toes with loss of toe nails.  Lymphadenopathy:  He has no cervical adenopathy.  Neurological: He is alert. He displays abnormal reflex.  Coordination abnormal.  Flat affect but no signs of distress. Makes eye contact. Slow measured speech with hesitations and expressive deficits. Decreased initiation and delayed processing.  Better insight as a whole. Recalls biographical information. Oriented to self and to rehab hospital. .     UES grossly 4/5. LE HF is 2-3/5 and is grossly 4/5 distally. RLE limited by wound/boot. Stocking glove sensory loss below the knees.  Skin: Skin is warm and dry.  Psychiatric: Judgment and thought content normal. His affect is blunt. His speech is delayed. He is slowed. He exhibits abnormal recent memory and abnormal remote memory   Assessment/Plan: 1. 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 Physiatrist is providing close team supervision and 24 hour management of active medical problems listed below. Physiatrist and rehab team continue to assess barriers to discharge/monitor patient progress toward functional and medical goals.   FIM: FIM - Bathing Bathing Steps Patient Completed: Chest;Right Arm;Left Arm;Abdomen;Front perineal area;Right upper leg;Left upper leg;Left lower leg (including foot);Right lower leg (including foot) Bathing: 5: Supervision: Safety issues/verbal cues  FIM - Upper Body Dressing/Undressing Upper body dressing/undressing steps patient completed: Thread/unthread right sleeve of pullover shirt/dresss;Thread/unthread left sleeve of pullover shirt/dress;Put head through opening of pull over shirt/dress;Pull shirt over trunk Upper body dressing/undressing: 5: Set-up assist to: Obtain  clothing/put away FIM - Lower Body Dressing/Undressing Lower body dressing/undressing steps patient completed: Thread/unthread right pants leg;Thread/unthread left pants leg;Pull pants up/down;Don/Doff left sock;Don/Doff left shoe;Fasten/unfasten pants;Fasten/unfasten left shoe Lower body  dressing/undressing: 4: Steadying Assist  FIM - Toileting Toileting steps completed by patient: Adjust clothing prior to toileting;Performs perineal hygiene;Adjust clothing after toileting Toileting: 4: Steadying assist  FIM - Archivist Transfers: 0-Activity did not occur  FIM - Banker Devices: Therapist, occupational: 4: Chair or W/C > Bed: Min A (steadying Pt. > 75%);4: Bed > Chair or W/C: Min A (steadying Pt. > 75%)  FIM - Locomotion: Wheelchair Distance: 150 Locomotion: Wheelchair: 5: Travels 150 ft or more: maneuvers on rugs and over door sills with supervision, cueing or coaxing FIM - Locomotion: Ambulation Locomotion: Ambulation Assistive Devices: Designer, industrial/product Ambulation/Gait Assistance: 4: Min assist Locomotion: Ambulation: 1: Travels less than 50 ft with minimal assistance (Pt.>75%)  Comprehension Comprehension Mode: Auditory Comprehension: 5-Follows basic conversation/direction: With extra time/assistive device  Expression Expression Mode: Verbal Expression: 4-Expresses basic 75 - 89% of the time/requires cueing 10 - 24% of the time. Needs helper to occlude trach/needs to repeat words.  Social Interaction Social Interaction: 5-Interacts appropriately 90% of the time - Needs monitoring or encouragement for participation or interaction.  Problem Solving Problem Solving: 4-Solves basic 75 - 89% of the time/requires cueing 10 - 24% of the time  Memory Memory: 4-Recognizes or recalls 75 - 89% of the time/requires cueing 10 - 24% of the time  Medical Problem List and Plan:  1. DVT Prophylaxis/Anticoagulation: Pharmaceutical: Lovenox  2. Pain Management: N/A  3. Mood: No signs of distress noted and patient expressing need to get better.Will have LCSW and psychologist follow up for evaluation and support. Provide ego support. Continue lexapro  4. Neuropsych: This patient is not capable of making decisions on his  own behalf.  5. DM type 2 with peripheral neuropathy:  . Po intake improved. Monitor BS with AC./HS cbg checks. Adjusting levemir with novolog for meal coverage. Consider increased lunch time coverage depending on what midday sugar is 6. Chronic neurogenic right foot ulcer: Pressure relief measures when in bed--CAM boot out of bed. Continue Cipro and doxycycline indefinitely due to chronic infection per wife. Plans for graft in the future? 7. Urinary retention:  Improving--off meds 8. Bowel incontinence:  Probiotic. Marland Kitchen 9. FEN: app much better  -RD follow up  -megace trial 10. Hypotension--encourage fluids, stopped lisinopril---denies symptoms  -better yesterday---follow for am hypertension  LOS (Days) 8 A FACE TO FACE EVALUATION WAS PERFORMED  Maddelyn Rocca T 09/06/2012 8:00 AM

## 2012-09-06 NOTE — Progress Notes (Signed)
NUTRITION FOLLOW UP  DOCUMENTATION CODES  Per approved criteria   -Obesity unspecified    Intervention:   1. Continue Glucerna Shake daily - pt taking half of the time 2. Recommend Carbohydrate Modified High to better meet kcal needs 3. RD to continue to follow nutrition care plan  Nutrition Dx: Increased nutrient needs r/t wound healing AEB estimated needs. Ongoing.  Goal:   PO intake to meet at least 90% of estimated needs. Met.  Monitor:   Weights, labs, PO intake, supplement acceptance  Assessment:   Pt admitted to [facility] for rehab r/t anoxic brain injury. Pt followed by inpatient clinical nutrition staff during recent admission due to poor appetite and intake.  D/c planned for 6/19.  Pt now with adequate appetite, consuming >75% of Carbohydrate Modified Medium meals. Megace discontinued. Pt reports that he does not like Prostat but will take Glucerna Shakes. RD saw lunch tray, pt consumed 100%.  Height: Ht Readings from Last 1 Encounters:  08/29/12 6\' 4"  (1.93 m)    Weight Status:   Wt Readings from Last 1 Encounters:  09/05/12 273 lb 9.5 oz (124.1 kg)  Pt with 11 lb wt loss x 1 week.  Re-estimated needs:  Kcal: 2400 - 2600 Protein: 120 - 140 grams Fluid: at least 2.5 liters daily  Skin:  stage 4 pressure ulcer on right foot; WOC RN is following. Wound is the result of a contact cast. Was planning for skin graft on 5/30  Diet Order: Carb Control Medium   Intake/Output Summary (Last 24 hours) at 09/06/12 1120 Last data filed at 09/06/12 1000  Gross per 24 hour  Intake    840 ml  Output   1325 ml  Net   -485 ml    Last BM: 6/10   Labs:   Recent Labs Lab 08/31/12 1834 09/04/12 0520 09/05/12 0608  NA  --  139  --   K  --  4.1  --   CL  --  99  --   CO2  --  33*  --   BUN  --  15  --   CREATININE  --  0.88 0.94  CALCIUM  --  9.7  --   GLUCOSE 439* 158*  --     CBG (last 3)   Recent Labs  09/05/12 1116 09/05/12 1649 09/06/12 0721  GLUCAP  290* 191* 158*    Scheduled Meds: . aspirin EC  81 mg Oral Daily  . docusate sodium  100 mg Oral BID  . doxycycline  100 mg Oral Q12H  . enoxaparin (LOVENOX) injection  40 mg Subcutaneous Q24H  . escitalopram  10 mg Oral Daily  . feeding supplement  237 mL Oral Q24H  . insulin aspart  0-20 Units Subcutaneous TID WC  . insulin aspart  0-5 Units Subcutaneous QHS  . insulin aspart  4 Units Subcutaneous TID WC  . insulin detemir  25 Units Subcutaneous Q supper  . insulin detemir  45 Units Subcutaneous Daily  . loratadine  10 mg Oral Daily  . multivitamin with minerals  1 tablet Oral Daily  . nitrofurantoin (macrocrystal-monohydrate)  100 mg Oral Q12H  . pantoprazole  40 mg Oral Q1200  . saccharomyces boulardii  250 mg Oral BID  . simvastatin  10 mg Oral q1800  . triamcinolone cream   Topical TID    Continuous Infusions:   none  Jarold Motto MS, RD, LDN Pager: [phone] After-hours pager: [phone]

## 2012-09-06 NOTE — Progress Notes (Signed)
Physical Therapy Session Note  Patient Details  Name: Thomas Frye MRN: [id number] Date of Birth: 08-Apr-1955  Today's Date: 09/06/2012 Time: 1300-1400 Time Calculation (min): 60 min  Short Term Goals: Week 2:     Skilled Therapeutic Interventions/Progress Updates:    Pt reports feeling better today stating a medication was held today that he thinks was causing dizziness and orthostatic hypotension symptoms. Pt performed sit <>stand x5  Without incidence of dizziness. Pt performed gait training with RW with R boot, min assist x 165 feet and 175 feet with wheelchair behind without c/o dizziness, good progress. Pt performed navigation of stairs ascending and descending with min assist  And requires increased time for proper foot placement before advancing limb. Standing static and dynamic balance activities crossing midline and reaching ipsilaterally with mild LOB and fatigue noted able to tolerate approximately 3 minutes of dynamic activities before requiring seated rest. Pt very encouraged with progress today and is steadily progressing towards stated goals.   Therapy Documentation Precautions:  Precautions Precautions: Fall Required Braces or Orthoses: Other Brace/Splint Other Brace/Splint: cam boot R ankle OOB Restrictions Weight Bearing Restrictions: No Other Position/Activity Restrictions: Has blue PRAFO for in the bed.   General:   Pain: Pain Assessment Pain Assessment: No/denies pain    : Ambulation Ambulation/Gait Assistance: 4: Min assist                See FIM for current functional status  Therapy/Group: Individual Therapy  Jackelyn Knife 09/06/2012, 4:01 PM

## 2012-09-06 NOTE — Plan of Care (Signed)
Problem: RH SKIN INTEGRITY Goal: RH STG ABLE TO PERFORM INCISION/WOUND CARE W/ASSISTANCE STG Able To Perform Incision/Wound Care With max Assistance.  Outcome: Not Progressing Requires total assist with right foot dressing .

## 2012-09-07 ENCOUNTER — Inpatient Hospital Stay (HOSPITAL_COMMUNITY): Payer: BC Managed Care – PPO | Admitting: Physical Therapy

## 2012-09-07 ENCOUNTER — Inpatient Hospital Stay (HOSPITAL_COMMUNITY): Payer: BC Managed Care – PPO | Admitting: Speech Pathology

## 2012-09-07 ENCOUNTER — Encounter (HOSPITAL_COMMUNITY): Payer: BC Managed Care – PPO | Admitting: Occupational Therapy

## 2012-09-07 DIAGNOSIS — G931 Anoxic brain damage, not elsewhere classified: Secondary | ICD-10-CM

## 2012-09-07 DIAGNOSIS — E1165 Type 2 diabetes mellitus with hyperglycemia: Secondary | ICD-10-CM

## 2012-09-07 DIAGNOSIS — IMO0001 Reserved for inherently not codable concepts without codable children: Secondary | ICD-10-CM

## 2012-09-07 DIAGNOSIS — E1142 Type 2 diabetes mellitus with diabetic polyneuropathy: Secondary | ICD-10-CM

## 2012-09-07 LAB — GLUCOSE, CAPILLARY
Glucose-Capillary: 249 mg/dL — ABNORMAL HIGH (ref 70–99)
Glucose-Capillary: 64 mg/dL — ABNORMAL LOW (ref 70–99)

## 2012-09-07 MED ORDER — INSULIN ASPART 100 UNIT/ML ~~LOC~~ SOLN
4.0000 [IU] | Freq: Two times a day (BID) | SUBCUTANEOUS | Status: DC
  Administered 2012-09-07 – 2012-09-12 (×11): 4 [IU] via SUBCUTANEOUS

## 2012-09-07 MED ORDER — INSULIN ASPART 100 UNIT/ML ~~LOC~~ SOLN
8.0000 [IU] | Freq: Every day | SUBCUTANEOUS | Status: DC
  Administered 2012-09-07 – 2012-09-12 (×6): 8 [IU] via SUBCUTANEOUS

## 2012-09-07 NOTE — Progress Notes (Signed)
Occupational Therapy Session Note  Patient Details  Name: SELDEN NOTEBOOM MRN: [id number] Date of Birth: December 15, 1955  Today's Date: 09/07/2012 Time: 1478-2956 Time Calculation (min): 45 min  Short Term Goals: Week 2:  OT Short Term Goal 1 (Week 2): LTG= STG  Skilled Therapeutic Interventions/Progress Updates:    1:1 self care retraining at shower level with focus on safe functional ambulation with RW around room to obtain items needed for ADL, simple problem solving, toilet tranfers, shower stall transfer, working memory, anticipatory awareness, d/c planning. Pt with no LOB in session but did need min cuing for hand placement with sit to stands.  Therapy Documentation Precautions:  Precautions Precautions: Fall Required Braces or Orthoses: Other Brace/Splint Other Brace/Splint: cam boot R ankle OOB Restrictions Weight Bearing Restrictions: No Other Position/Activity Restrictions: Has blue PRAFO for in the bed.   Pain:  no c/o pain  See FIM for current functional status  Therapy/Group: Individual Therapy  Roney Mans [facility] 09/07/2012, 1:18 PM

## 2012-09-07 NOTE — Progress Notes (Signed)
Physical Therapy Note  Patient Details  Name: Thomas Frye MRN: [id number] Date of Birth: [birth year] Today's Date: 09/07/2012  Time 1: 800-855 55 minutes  1:1 No c/o pain or dizziness.  Pt performed gait with RW in controlled and home environments with close supervision, able to self correct LOB with RW.  Ace wrap applied to assist DF on L foot, much improved gait pattern.  PA notified to order custom AFO for L LE.  Standing balance training with stepping over obstacles with 1 UE support with min-mod A, tap ups and stepping fwd/bkwd/laterally with UE support with min A, step ups and static stance with 1 UE on step for focus on L LE strengthening with tactile cues for glute contraction.  Mini squats and side stepping for continued glute strengthening.  Pt fatigues easily with balance activities but with much improved activity tolerance and no c/o dizziness during session.  Time 2: 1400-1445 45 minutes  1:1 No c/o pain.   Pt fitted for L AFO.  Pt gait with and without AFO and determined pt would be best fit for toe-off brace.  Pt agreeable and states that it feels "more secure".  Pt performed gait with obstacle negotiation, side and backward stepping, in home and controlled environments with close supervision with RW and AFO.  Pt with much improved activity tolerance throughout treatment today.   Shiana Rappleye 09/07/2012, 8:54 AM

## 2012-09-07 NOTE — Progress Notes (Signed)
Speech Language Pathology Daily Session Note  Patient Details  Name: Thomas Frye MRN: [id number] Date of Birth: 09/19/1955  Today's Date: 09/07/2012 Time: 1000-1045 Time Calculation (min): 45 min  Short Term Goals: Week 2: SLP Short Term Goal 1 (Week 2): Pt will self-monitor and correct verbal errors at the sentence level with supervision verbal and question cues.  SLP Short Term Goal 2 (Week 2): Pt will utilize external memory aids to increase recall/carryover of newly learned information with supervision verbal and question cues.  SLP Short Term Goal 3 (Week 2): Pt will demonstrate functional problem solving with basic and familiar tasks with supervision verbal and question cues.  SLP Short Term Goal 4 (Week 2): Pt will demonstrate selective attention to task for 30 miutes with supervision verbal cues for redirection.   Skilled Therapeutic Interventions: Treatment focus on cognitive-linguistic goals. SLP facilitated session by providing extra time and Min A semantic cues during a visually distracting environment with focus on processing speed.  Pt also participated in working memory game with focus on utilization of compensatory strategies. Pt required Min A semantic cues to complete task.    FIM:  Comprehension Comprehension Mode: Auditory Comprehension: 6-Follows complex conversation/direction: With extra time/assistive device Expression Expression Mode: Verbal Expression: 5-Expresses basic 90% of the time/requires cueing < 10% of the time. Social Interaction Social Interaction: 5-Interacts appropriately 90% of the time - Needs monitoring or encouragement for participation or interaction. Problem Solving Problem Solving: 5-Solves complex 90% of the time/cues < 10% of the time Memory Memory: 4-Recognizes or recalls 75 - 89% of the time/requires cueing 10 - 24% of the time  Pain Pain Assessment Pain Assessment: No/denies pain  Therapy/Group: Individual Therapy  Nakshatra Klose,  Davarius Ridener 09/07/2012, 3:50 PM

## 2012-09-07 NOTE — Progress Notes (Signed)
Orthopedic Tech Progress Note Patient Details:  Thomas Frye Sep 04, 1955 [id number] Advanced called for brace order Patient ID: [name], male   DOB: 07-01-1955, 57 y.o.   MRN: [id number]   [name] 09/07/2012, 12:06 PM

## 2012-09-07 NOTE — Progress Notes (Signed)
Physical Therapy Weekly Progress Note  Patient Details  Name: Thomas Frye MRN: [id number] Date of Birth: May 01, 1955  Today's Date: 09/07/2012  Patient has met 3 of 3 short term goals.  Pt initially limited by orthostatic hypotension but has been able to progress with mobility and gait and is currently min A overall with improved activity tolerance, able to gait >150' with min A with RW.  Patient continues to demonstrate the following deficits: impaired balance, gait, activity tolerance and therefore will continue to benefit from skilled PT intervention to enhance overall performance with activity tolerance, balance, postural control and ability to compensate for deficits.  Patient progressing toward long term goals..  Continue plan of care.  PT Short Term Goals Week 1:  PT Short Term Goal 1 (Week 1): Pt will perform functional transfers with min A PT Short Term Goal 1 - Progress (Week 1): Met PT Short Term Goal 2 (Week 1): Pt will gait in controlled environment 50' with min A PT Short Term Goal 2 - Progress (Week 1): Met PT Short Term Goal 3 (Week 1): Pt will demo intellectual awareness with supervision assist PT Short Term Goal 3 - Progress (Week 1): Met  Skilled Therapeutic Interventions/Progress Updates:  Ambulation/gait training;Balance/vestibular training;Cognitive remediation/compensation;Community reintegration;Patient/family education;Stair training;UE/LE Coordination activities;UE/LE Strength taining/ROM;Pain management;Splinting/orthotics;DME/adaptive equipment instruction;Neuromuscular re-education;Therapeutic Exercise;Wheelchair propulsion/positioning;Therapeutic Activities;Functional mobility training;Discharge planning    See FIM for current functional status   Exavior Kimmons 09/07/2012, 8:04 AM

## 2012-09-07 NOTE — Progress Notes (Addendum)
Patient ID: Thomas Frye, male   DOB: 1955/12/24, 57 y.o.   MRN: [id number] Subjective/Complaints: No complaints. Getting stronger. Sugars not as high yesterday  A 12 point review of systems has been performed and if not noted above is otherwise negative.   Objective: Vital Signs: Blood pressure 150/80, pulse 81, temperature 98.3 F (36.8 C), temperature source Oral, resp. rate 19, height 6\' 4"  (1.93 m), weight 124.1 kg (273 lb 9.5 oz), SpO2 94.00%. No results found. No results found for this basename: WBC, HGB, HCT, PLT,  in the last 72 hours  Recent Labs  09/05/12 0608  CREATININE 0.94   CBG (last 3)   Recent Labs  09/06/12 1745 09/06/12 2110 09/07/12 0711  GLUCAP 240* 159* 97    Wt Readings from Last 3 Encounters:  09/05/12 124.1 kg (273 lb 9.5 oz)  08/22/12 126.4 kg (278 lb 10.6 oz)  12/10/10 129.729 kg (286 lb)    Physical Exam:  Constitutional: He appears well-developed and well-nourished.  HENT:  Head: Normocephalic and atraumatic.  Right Ear: External ear normal.  Left Ear: External ear normal.  Eyes: Conjunctivae and EOM are normal. Pupils are equal, round, and reactive to light.  Neck: Normal range of motion. No JVD present. No tracheal deviation present. No thyromegaly present.  Cardiovascular: Normal rate and regular rhythm.  Pulmonary/Chest: Effort normal and breath sounds normal. No respiratory distress. He has no wheezes. He has no rales.  Abdominal: Soft. Bowel sounds are normal. He exhibits no distension. There is no tenderness.  Musculoskeletal: He exhibits no edema.    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 Right lateral foot with 2 cm stage II ulcer along 5th MT--clean. Left toes with loss of toe nails.  Lymphadenopathy:  He has no cervical adenopathy.  Neurological: He is alert. He displays abnormal reflex. Coordination abnormal.  Flat affect but no signs of distress. Makes eye contact. Slow measured speech with  hesitations and expressive deficits. Decreased initiation and delayed processing.  Better insight as a whole. Recalls biographical information. Oriented to self and to rehab hospital. .     UES grossly 4/5. LE HF is 2-3/5 and is grossly 4/5 distally. RLE limited by wound/boot. Stocking glove sensory loss below the knees.  Skin: Skin is warm and dry.  Psychiatric: Judgment and thought content normal. His affect is blunt. His speech is delayed. He is slowed. He exhibits abnormal recent memory and abnormal remote memory   Assessment/Plan: 1. 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 Physiatrist is providing close team supervision and 24 hour management of active medical problems listed below. Physiatrist and rehab team continue to assess barriers to discharge/monitor patient progress toward functional and medical goals.   FIM: FIM - Bathing Bathing Steps Patient Completed: Chest;Right Arm;Left Arm;Abdomen;Front perineal area;Right upper leg;Left upper leg;Left lower leg (including foot);Right lower leg (including foot) Bathing: 5: Supervision: Safety issues/verbal cues  FIM - Upper Body Dressing/Undressing Upper body dressing/undressing steps patient completed: Thread/unthread right sleeve of pullover shirt/dresss;Thread/unthread left sleeve of pullover shirt/dress;Put head through opening of pull over shirt/dress;Pull shirt over trunk Upper body dressing/undressing: 5: Set-up assist to: Obtain clothing/put away FIM - Lower Body Dressing/Undressing Lower body dressing/undressing steps patient completed: Thread/unthread right pants leg;Thread/unthread left pants leg;Pull pants up/down;Don/Doff left sock;Don/Doff left shoe;Fasten/unfasten pants;Fasten/unfasten left shoe Lower body dressing/undressing: 4: Steadying Assist  FIM - Toileting Toileting steps completed by patient: Adjust  clothing  prior to toileting;Performs perineal hygiene;Adjust clothing after toileting Toileting: 4: Steadying assist  FIM - Archivist Transfers: 0-Activity did not occur  FIM - Banker Devices: Therapist, occupational: 4: Chair or W/C > Bed: Min A (steadying Pt. > 75%)  FIM - Locomotion: Wheelchair Distance: 150 Locomotion: Wheelchair: 5: Travels 150 ft or more: maneuvers on rugs and over door sills with supervision, cueing or coaxing FIM - Locomotion: Ambulation Locomotion: Ambulation Assistive Devices: Designer, industrial/product Ambulation/Gait Assistance: 4: Min assist Locomotion: Ambulation: 4: Travels 150 ft or more with minimal assistance (Pt.>75%) (wheelchair behnid)  Comprehension Comprehension Mode: Auditory Comprehension: 6-Follows complex conversation/direction: With extra time/assistive device  Expression Expression Mode: Verbal Expression: 5-Expresses basic 90% of the time/requires cueing < 10% of the time.  Social Interaction Social Interaction: 5-Interacts appropriately 90% of the time - Needs monitoring or encouragement for participation or interaction.  Problem Solving Problem Solving: 5-Solves basic 90% of the time/requires cueing < 10% of the time  Memory Memory: 4-Recognizes or recalls 75 - 89% of the time/requires cueing 10 - 24% of the time  Medical Problem List and Plan:  1. DVT Prophylaxis/Anticoagulation: Pharmaceutical: Lovenox  2. Pain Management: N/A  3. Mood: No signs of distress noted and patient expressing need to get better.Will have LCSW and psychologist follow up for evaluation and support. Provide ego support. Continue lexapro  4. Neuropsych: This patient is not capable of making decisions on his own behalf.  5. DM type 2 with peripheral neuropathy:  . Po intake improved. Monitor BS with AC./HS cbg checks. Adjusting levemir with novolog for meal coverage. Will increase lunchtime covg to 8 u 6. Chronic  neurogenic right foot ulcer: Pressure relief measures when in bed--CAM boot out of bed. Continue Cipro and doxycycline indefinitely due to chronic infection per wife. Plans for graft in the future? 7. Urinary retention:  Improving--off meds 8. Bowel incontinence:  Probiotic. Marland Kitchen 9. FEN: app much better  -RD follow up   -megace trial 10. Hypotension--encourage fluids, stopped lisinopril---denies symptoms  -better yesterday---follow for am hypertension  LOS (Days) 9 A FACE TO FACE EVALUATION WAS PERFORMED  SWARTZ,ZACHARY T 09/07/2012 7:50 AM

## 2012-09-08 ENCOUNTER — Inpatient Hospital Stay (HOSPITAL_COMMUNITY): Payer: BC Managed Care – PPO | Admitting: Speech Pathology

## 2012-09-08 LAB — GLUCOSE, CAPILLARY
Glucose-Capillary: 140 mg/dL — ABNORMAL HIGH (ref 70–99)
Glucose-Capillary: 88 mg/dL (ref 70–99)
Glucose-Capillary: 133 mg/dL — ABNORMAL HIGH (ref 70–99)

## 2012-09-08 NOTE — Progress Notes (Signed)
Resp Care Note:Pt has Home CPAP unit at bedside,stated he can put it on when he is ready to sleep.

## 2012-09-08 NOTE — Progress Notes (Signed)
Speech Language Pathology Daily Session Note  Patient Details  Name: Thomas Frye MRN: [id number] Date of Birth: 10/30/1955  Today's Date: 09/08/2012 Time: 1115-1140 Time Calculation (min): 25 min  Short Term Goals: Week 2: SLP Short Term Goal 1 (Week 2): Pt will self-monitor and correct verbal errors at the sentence level with supervision verbal and question cues.  SLP Short Term Goal 2 (Week 2): Pt will utilize external memory aids to increase recall/carryover of newly learned information with supervision verbal and question cues.  SLP Short Term Goal 3 (Week 2): Pt will demonstrate functional problem solving with basic and familiar tasks with supervision verbal and question cues.  SLP Short Term Goal 4 (Week 2): Pt will demonstrate selective attention to task for 30 miutes with supervision verbal cues for redirection.   Skilled Therapeutic Interventions: Treatment focused on cognitive goals. Patient required min A verbal cues to complete working memory tasks.  He was able to problem solve with min A assistance.  Continue with current treatment plan.    FIM:  Comprehension Comprehension Mode: Auditory Comprehension: 6-Follows complex conversation/direction: With extra time/assistive device Expression Expression Mode: Verbal Expression: 5-Expresses basic 90% of the time/requires cueing < 10% of the time. Problem Solving Problem Solving: 5-Solves basic 90% of the time/requires cueing < 10% of the time Memory Memory: 4-Recognizes or recalls 75 - 89% of the time/requires cueing 10 - 24% of the time FIM - Eating Eating Activity: 7: Complete independence:no helper  Pain Pain Assessment Pain Assessment: No/denies pain  Therapy/Group: Individual Therapy  Lenny Pastel 09/08/2012, 4:16 PM

## 2012-09-08 NOTE — Progress Notes (Addendum)
Patient ID: Thomas Frye, male   DOB: April 29, 1955, 57 y.o.   MRN: [id number] Subjective/Complaints: Happy with AFO and improvement with gait. Denies pain.  A 12 point review of systems has been performed and if not noted above is otherwise negative.   Objective: Vital Signs: Blood pressure 145/84, pulse 83, temperature 98.2 F (36.8 C), temperature source Oral, resp. rate 17, height 6\' 4"  (1.93 m), weight 124.1 kg (273 lb 9.5 oz), SpO2 94.00%. No results found. No results found for this basename: WBC, HGB, HCT, PLT,  in the last 72 hours No results found for this basename: NA, K, CL, CO, GLUCOSE, BUN, CREATININE, CALCIUM,  in the last 72 hours CBG (last 3)   Recent Labs  09/07/12 1720 09/07/12 2059 09/08/12 0720  GLUCAP 106* 129* 140*    Wt Readings from Last 3 Encounters:  09/05/12 124.1 kg (273 lb 9.5 oz)  08/22/12 126.4 kg (278 lb 10.6 oz)  12/10/10 129.729 kg (286 lb)    Physical Exam:  Constitutional: He appears well-developed and well-nourished.  HENT:  Head: Normocephalic and atraumatic.  Right Ear: External ear normal.  Left Ear: External ear normal.  Eyes: Conjunctivae and EOM are normal. Pupils are equal, round, and reactive to light.  Neck: Normal range of motion. No JVD present. No tracheal deviation present. No thyromegaly present.  Cardiovascular: Normal rate and regular rhythm.  Pulmonary/Chest: Effort normal and breath sounds normal. No respiratory distress. He has no wheezes. He has no rales.  Abdominal: Soft. Bowel sounds are normal. He exhibits no distension. There is no tenderness.  Musculoskeletal: He exhibits no edema.    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 Right lateral foot with 2 cm stage II ulcer along 5th MT--clean and without change. Left toes with loss of toe nails.  Lymphadenopathy:  He has no cervical adenopathy.  Neurological: He is alert. He displays abnormal reflex. Coordination abnormal.  Flat affect but no  signs of distress. Makes eye contact. Slow measured speech with hesitations and expressive deficits. Decreased initiation and delayed processing.  Better insight as a whole. Recalls biographical information. Oriented to self and to rehab hospital. .     UES grossly 4/5. LE HF is 2-3/5 and is grossly 4/5 distally. RLE limited by wound/boot. Stocking glove sensory loss below the knees.  Skin: Skin is warm and dry.  Psychiatric: Judgment and thought content normal. His affect is blunt. His speech is delayed. He is slowed. He exhibits abnormal recent memory and abnormal remote memory   Assessment/Plan: 1. 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 Physiatrist is providing close team supervision and 24 hour management of active medical problems listed below. Physiatrist and rehab team continue to assess barriers to discharge/monitor patient progress toward functional and medical goals.   FIM: FIM - Bathing Bathing Steps Patient Completed: Chest;Right Arm;Left Arm;Abdomen;Front perineal area;Right upper leg;Left upper leg;Left lower leg (including foot);Right lower leg (including foot) Bathing: 5: Supervision: Safety issues/verbal cues  FIM - Upper Body Dressing/Undressing Upper body dressing/undressing steps patient completed: Thread/unthread right sleeve of pullover shirt/dresss;Thread/unthread left sleeve of pullover shirt/dress;Put head through opening of pull over shirt/dress;Pull shirt over trunk Upper body dressing/undressing: 5: Set-up assist to: Obtain clothing/put away FIM - Lower Body Dressing/Undressing Lower body dressing/undressing steps patient completed: Thread/unthread right pants leg;Thread/unthread left pants leg;Pull pants up/down;Don/Doff left sock;Don/Doff left shoe;Fasten/unfasten pants;Fasten/unfasten left shoe Lower body dressing/undressing: 4:  Steadying Assist  FIM -  Toileting Toileting steps completed by patient: Adjust clothing prior to toileting;Performs perineal hygiene;Adjust clothing after toileting Toileting: 4: Steadying assist  FIM - Archivist Transfers: 0-Activity did not occur  FIM - Banker Devices: Therapist, occupational: 5: Chair or W/C > Bed: Supervision (verbal cues/safety issues);5: Bed > Chair or W/C: Supervision (verbal cues/safety issues)  FIM - Locomotion: Wheelchair Distance: 150 Locomotion: Wheelchair: 5: Travels 150 ft or more: maneuvers on rugs and over door sills with supervision, cueing or coaxing FIM - Locomotion: Ambulation Locomotion: Ambulation Assistive Devices: Designer, industrial/product Ambulation/Gait Assistance: 4: Min assist Locomotion: Ambulation: 5: Travels 150 ft or more with supervision/safety issues  Comprehension Comprehension Mode: Auditory Comprehension: 6-Follows complex conversation/direction: With extra time/assistive device  Expression Expression Mode: Verbal Expression: 5-Expresses basic 90% of the time/requires cueing < 10% of the time.  Social Interaction Social Interaction: 5-Interacts appropriately 90% of the time - Needs monitoring or encouragement for participation or interaction.  Problem Solving Problem Solving: 5-Solves complex 90% of the time/cues < 10% of the time  Memory Memory: 4-Recognizes or recalls 75 - 89% of the time/requires cueing 10 - 24% of the time  Medical Problem List and Plan:  1. DVT Prophylaxis/Anticoagulation: Pharmaceutical: Lovenox  2. Pain Management: N/A  3. Mood: No signs of distress noted and patient expressing need to get better.Will have LCSW and psychologist follow up for evaluation and support. Provide ego support. Continue lexapro  4. Neuropsych: This patient is not capable of making decisions on his own behalf.  5. DM type 2 with peripheral neuropathy:  . Po intake improved. Monitor BS with AC./HS cbg  checks. Adjusting levemir with novolog for meal coverage. increased lunchtime covg to 8 u.  -watch for hypoglycemia 6. Chronic neurogenic right foot ulcer: Pressure relief measures when in bed--CAM boot out of bed. Continue Cipro and doxycycline indefinitely due to chronic infection.  Wound with slow/minimal healing Plans for graft in the future? 7. Urinary retention:  Improving--off meds 8. Bowel incontinence:  Probiotic. Marland Kitchen 9. FEN: app much better  -RD follow up   -megace stopped 10. Hypotension--encourage fluids, stopped lisinopril---denies symptoms  -better yesterday---follow for am hypertension  LOS (Days) 10 A FACE TO FACE EVALUATION WAS PERFORMED  SWARTZ,ZACHARY T 09/08/2012 8:09 AM

## 2012-09-09 LAB — GLUCOSE, CAPILLARY
Glucose-Capillary: 218 mg/dL — ABNORMAL HIGH (ref 70–99)
Glucose-Capillary: 270 mg/dL — ABNORMAL HIGH (ref 70–99)
Glucose-Capillary: 136 mg/dL — ABNORMAL HIGH (ref 70–99)

## 2012-09-09 MED ORDER — INSULIN DETEMIR 100 UNIT/ML ~~LOC~~ SOLN
30.0000 [IU] | Freq: Every day | SUBCUTANEOUS | Status: DC
  Administered 2012-09-09 – 2012-09-12 (×4): 30 [IU] via SUBCUTANEOUS
  Filled 2012-09-09 (×8): qty 0.3

## 2012-09-09 NOTE — Progress Notes (Signed)
Patient ID: Thomas Frye, male   DOB: 1955-06-12, 57 y.o.   MRN: [id number] Subjective/Complaints: No complaints.  A 12 point review of systems has been performed and if not noted above is otherwise negative.   Objective: Vital Signs: Blood pressure 153/81, pulse 90, temperature 98 F (36.7 C), temperature source Oral, resp. rate 18, height 6\' 4"  (1.93 m), weight 124.1 kg (273 lb 9.5 oz), SpO2 96.00%. No results found. No results found for this basename: WBC, HGB, HCT, PLT,  in the last 72 hours No results found for this basename: NA, K, CL, CO, GLUCOSE, BUN, CREATININE, CALCIUM,  in the last 72 hours CBG (last 3)   Recent Labs  09/08/12 1621 09/08/12 2027 09/09/12 0727  GLUCAP 88 133* 218*    Wt Readings from Last 3 Encounters:  09/05/12 124.1 kg (273 lb 9.5 oz)  08/22/12 126.4 kg (278 lb 10.6 oz)  12/10/10 129.729 kg (286 lb)    Physical Exam:  Constitutional: He appears well-developed and well-nourished.  HENT:  Head: Normocephalic and atraumatic.  Right Ear: External ear normal.  Left Ear: External ear normal.  Eyes: Conjunctivae and EOM are normal. Pupils are equal, round, and reactive to light.  Neck: Normal range of motion. No JVD present. No tracheal deviation present. No thyromegaly present.  Cardiovascular: Normal rate and regular rhythm.  Pulmonary/Chest: Effort normal and breath sounds normal. No respiratory distress. He has no wheezes. He has no rales.  Abdominal: Soft. Bowel sounds are normal. He exhibits no distension. There is no tenderness.  Musculoskeletal: He exhibits no edema.    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 Right lateral foot with 2 cm stage II ulcer along 5th MT--clean and without change. Left toes with loss of toe nails.  Lymphadenopathy:  He has no cervical adenopathy.  Neurological: He is alert. He displays abnormal reflex. Coordination abnormal.  Flat affect but no signs of distress. Makes eye contact. Slow  measured speech with hesitations and expressive deficits. Decreased initiation and delayed processing.  Better insight as a whole. Recalls biographical information. Oriented to self and to rehab hospital. .     UES grossly 4/5. LE HF is 2-3/5 and is grossly 4/5 distally. RLE limited by wound/boot. Stocking glove sensory loss below the knees.  Skin: Skin is warm and dry.  Psychiatric: Judgment and thought content normal. His affect is improving. Thought processing quicker and more appropriate. Memory is better  Assessment/Plan: 1. 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 Physiatrist is providing close team supervision and 24 hour management of active medical problems listed below. Physiatrist and rehab team continue to assess barriers to discharge/monitor patient progress toward functional and medical goals.   FIM: FIM - Bathing Bathing Steps Patient Completed: Chest;Right Arm;Left Arm;Abdomen;Front perineal area;Right upper leg;Left upper leg;Left lower leg (including foot);Right lower leg (including foot) Bathing: 5: Supervision: Safety issues/verbal cues  FIM - Upper Body Dressing/Undressing Upper body dressing/undressing steps patient completed: Thread/unthread right sleeve of pullover shirt/dresss;Thread/unthread left sleeve of pullover shirt/dress;Put head through opening of pull over shirt/dress;Pull shirt over trunk Upper body dressing/undressing: 5: Set-up assist to: Obtain clothing/put away FIM - Lower Body Dressing/Undressing Lower body dressing/undressing steps patient completed: Thread/unthread right pants leg;Thread/unthread left pants leg;Pull pants up/down;Don/Doff left sock;Don/Doff left shoe;Fasten/unfasten pants;Fasten/unfasten left shoe Lower body dressing/undressing: 4: Steadying Assist  FIM - Toileting Toileting steps completed by patient: Adjust clothing prior to  toileting;Performs perineal hygiene;Adjust clothing after toileting Toileting: 4: Steadying assist  FIM - Archivist Transfers: 0-Activity did not occur  FIM - Banker Devices: Therapist, occupational: 5: Chair or W/C > Bed: Supervision (verbal cues/safety issues);5: Bed > Chair or W/C: Supervision (verbal cues/safety issues)  FIM - Locomotion: Wheelchair Distance: 150 Locomotion: Wheelchair: 5: Travels 150 ft or more: maneuvers on rugs and over door sills with supervision, cueing or coaxing FIM - Locomotion: Ambulation Locomotion: Ambulation Assistive Devices: Designer, industrial/product Ambulation/Gait Assistance: 4: Min assist Locomotion: Ambulation: 5: Travels 150 ft or more with supervision/safety issues  Comprehension Comprehension Mode: Auditory Comprehension: 6-Follows complex conversation/direction: With extra time/assistive device  Expression Expression Mode: Verbal Expression: 5-Expresses basic 90% of the time/requires cueing < 10% of the time.  Social Interaction Social Interaction: 5-Interacts appropriately 90% of the time - Needs monitoring or encouragement for participation or interaction.  Problem Solving Problem Solving: 5-Solves basic 90% of the time/requires cueing < 10% of the time  Memory Memory: 4-Recognizes or recalls 75 - 89% of the time/requires cueing 10 - 24% of the time  Medical Problem List and Plan:  1. DVT Prophylaxis/Anticoagulation: Pharmaceutical: Lovenox  2. Pain Management: N/A  3. Mood: No signs of distress noted and patient expressing need to get better.Will have LCSW and psychologist follow up for evaluation and support. Provide ego support. Continue lexapro  4. Neuropsych: This patient is not capable of making decisions on his own behalf.  5. DM type 2 with peripheral neuropathy:  . Po intake improved. Monitor BS with AC./HS cbg checks. Continue to adjust levemir with novolog for meal coverage.  increased lunchtime covg to 8 u.  -watch for hypoglycemia 6. Chronic neurogenic right foot ulcer: Pressure relief measures when in bed--CAM boot out of bed. Continue Cipro and doxycycline indefinitely due to chronic infection.  Wound with slow/minimal healing Plans for graft in the future? 7. Urinary retention:  Improving--off meds 8. Bowel incontinence:  Probiotic. Marland Kitchen 9. FEN: app much better  -RD follow up   -megace stopped 10. Hypotension--encourage fluids, stopped lisinopril---denies symptoms  -better yesterday---follow for am hypertension  LOS (Days) 11 A FACE TO FACE EVALUATION WAS PERFORMED  SWARTZ,ZACHARY T 09/09/2012 7:58 AM

## 2012-09-09 NOTE — Progress Notes (Signed)
Pt has home cpap, but something is wrong with device.  Pt did not want to try our device, but says wife will have his fixed by wed. Pt informed to tell RN if he changes his mind and rt will bring machine.

## 2012-09-10 ENCOUNTER — Inpatient Hospital Stay (HOSPITAL_COMMUNITY): Payer: BC Managed Care – PPO | Admitting: Occupational Therapy

## 2012-09-10 ENCOUNTER — Inpatient Hospital Stay (HOSPITAL_COMMUNITY): Payer: BC Managed Care – PPO | Admitting: Physical Therapy

## 2012-09-10 ENCOUNTER — Inpatient Hospital Stay (HOSPITAL_COMMUNITY): Payer: BC Managed Care – PPO

## 2012-09-10 ENCOUNTER — Encounter (HOSPITAL_COMMUNITY): Payer: BC Managed Care – PPO | Admitting: Occupational Therapy

## 2012-09-10 DIAGNOSIS — IMO0001 Reserved for inherently not codable concepts without codable children: Secondary | ICD-10-CM

## 2012-09-10 DIAGNOSIS — G931 Anoxic brain damage, not elsewhere classified: Secondary | ICD-10-CM

## 2012-09-10 DIAGNOSIS — E1142 Type 2 diabetes mellitus with diabetic polyneuropathy: Secondary | ICD-10-CM

## 2012-09-10 DIAGNOSIS — E1165 Type 2 diabetes mellitus with hyperglycemia: Secondary | ICD-10-CM

## 2012-09-10 LAB — GLUCOSE, CAPILLARY
Glucose-Capillary: 205 mg/dL — ABNORMAL HIGH (ref 70–99)
Glucose-Capillary: 146 mg/dL — ABNORMAL HIGH (ref 70–99)

## 2012-09-10 MED ORDER — INSULIN DETEMIR 100 UNIT/ML ~~LOC~~ SOLN
45.0000 [IU] | Freq: Every day | SUBCUTANEOUS | Status: DC
  Administered 2012-09-11 – 2012-09-13 (×3): 45 [IU] via SUBCUTANEOUS
  Filled 2012-09-10 (×3): qty 0.45

## 2012-09-10 MED ORDER — CIPROFLOXACIN HCL 750 MG PO TABS
750.0000 mg | ORAL_TABLET | Freq: Two times a day (BID) | ORAL | Status: DC
  Administered 2012-09-12 – 2012-09-13 (×3): 750 mg via ORAL
  Filled 2012-09-10 (×5): qty 1

## 2012-09-10 NOTE — Progress Notes (Signed)
Occupational Therapy Session Note  Patient Details  Name: Thomas Frye MRN: [id number] Date of Birth: 06-03-1955  Today's Date: 09/10/2012 Time: 0815-0900 Time Calculation (min): 45 min  Short Term Goals: Week 2:  OT Short Term Goal 1 (Week 2): LTG= STG  Skilled Therapeutic Interventions/Progress Updates:    1:1 self care retraining with wife present down in ADL apartment with focus on short distance functional ambulation with RW with supervision, education on AFO (how to don/. Doff, skin care, and reasoning for AFO), tub bench transfer into bathtub, donning and doffing CAM boot, care provided here for foot, dressing sitting edge of tub bench.  Therapy Documentation Precautions:  Precautions Precautions: Fall Required Braces or Orthoses: Other Brace/Splint Other Brace/Splint: cam boot R ankle OOB Restrictions Weight Bearing Restrictions: No Other Position/Activity Restrictions: Has blue PRAFO for in the bed.   Pain:  no reported pain  See FIM for current functional status  Therapy/Group: Individual Therapy  Roney Mans [facility] 09/10/2012, 10:41 AM

## 2012-09-10 NOTE — Progress Notes (Signed)
Occupational Therapy Session Note  Patient Details  Name: KEONI RISINGER MRN: [id number] Date of Birth: [birth year]  Today's Date: 09/10/2012 Time: 1430-1500 Time Calculation (min): 30 min  Short Term Goals: Week 2:  OT Short Term Goal 1 (Week 2): LTG= STG  Skilled Therapeutic Interventions/Progress Updates:    1:1 Performed skin check on left foot from wearing AFO all day. Pt noted to have a small hardened blister on his third toe. Not red but does have fluid under skin- RN came and assessed. Recommended taking a break from wearing AFO. Continued working on standing balance without UE support with stepping onto block focusing on weight shifting, self corrections of LOB and sit <>stands without UE support.   Therapy Documentation Precautions:  Precautions Precautions: Fall Required Braces or Orthoses: Other Brace/Splint Other Brace/Splint: cam boot R ankle OOB Restrictions Weight Bearing Restrictions: No Other Position/Activity Restrictions: Has blue PRAFO for in the bed.   Pain: No c/o pain See FIM for current functional status  Therapy/Group: Individual Therapy  Roney Mans [facility] 09/10/2012, 3:22 PM

## 2012-09-10 NOTE — Plan of Care (Signed)
Problem: RH SKIN INTEGRITY Goal: RH STG MAINTAIN SKIN INTEGRITY WITH ASSISTANCE STG Maintain Skin Integrity With mod Assistance.  Outcome: Not Progressing Requires total assist with dressing changes

## 2012-09-10 NOTE — Progress Notes (Signed)
Speech Language Pathology Daily Session Note  Patient Details  Name: Thomas Frye MRN: [id number] Date of Birth: July 04, 1955  Today's Date: 09/10/2012 Time: 3086-5784 Time Calculation (min): 30 min  Short Term Goals: Week 2: SLP Short Term Goal 1 (Week 2): Pt will self-monitor and correct verbal errors at the sentence level with supervision verbal and question cues.  SLP Short Term Goal 2 (Week 2): Pt will utilize external memory aids to increase recall/carryover of newly learned information with supervision verbal and question cues.  SLP Short Term Goal 3 (Week 2): Pt will demonstrate functional problem solving with basic and familiar tasks with supervision verbal and question cues.  SLP Short Term Goal 4 (Week 2): Pt will demonstrate selective attention to task for 30 miutes with supervision verbal cues for redirection.   Skilled Therapeutic Interventions: Skilled treatment focused on cognitive goals. SLP facilitated session by providing min A visual cues for patient to recall rules for card game. He was able to problem solve to complete task with modified independence and extra wait time.    FIM:  Comprehension Comprehension Mode: Auditory Comprehension: 6-Follows complex conversation/direction: With extra time/assistive device Expression Expression Mode: Verbal Expression: 5-Expresses basic needs/ideas: With extra time/assistive device Social Interaction Social Interaction: 6-Interacts appropriately with others with medication or extra time (anti-anxiety, antidepressant). Problem Solving Problem Solving: 5-Solves basic problems: With no assist Memory Memory: 4-Recognizes or recalls 75 - 89% of the time/requires cueing 10 - 24% of the time  Pain  N/A  Therapy/Group: Individual Therapy  Maxcine Ham 09/10/2012, 2:25 PM  Maxcine Ham, M.A. CCC-SLP

## 2012-09-10 NOTE — Progress Notes (Signed)
Patient ID: Thomas Frye, male   DOB: [birth year], 57 y.o.   MRN: [id number] Subjective/Complaints: Had a good day. Memory is better. A 12 point review of systems has been performed and if not noted above is otherwise negative.   Objective: Vital Signs: Blood pressure 154/88, pulse 86, temperature 98 F (36.7 C), temperature source Oral, resp. rate 20, height 6\' 4"  (1.93 m), weight 124.1 kg (273 lb 9.5 oz), SpO2 96.00%. No results found. No results found for this basename: WBC, HGB, HCT, PLT,  in the last 72 hours No results found for this basename: NA, K, CL, CO, GLUCOSE, BUN, CREATININE, CALCIUM,  in the last 72 hours CBG (last 3)   Recent Labs  09/09/12 1622 09/09/12 2036 09/10/12 0726  GLUCAP 136* 158* 205*    Wt Readings from Last 3 Encounters:  09/05/12 124.1 kg (273 lb 9.5 oz)  08/22/12 126.4 kg (278 lb 10.6 oz)  12/10/10 129.729 kg (286 lb)    Physical Exam:  Constitutional: He appears well-developed and well-nourished.  HENT:  Head: Normocephalic and atraumatic.  Right Ear: External ear normal.  Left Ear: External ear normal.  Eyes: Conjunctivae and EOM are normal. Pupils are equal, round, and reactive to light.  Neck: Normal range of motion. No JVD present. No tracheal deviation present. No thyromegaly present.  Cardiovascular: Normal rate and regular rhythm.  Pulmonary/Chest: Effort normal and breath sounds normal. No respiratory distress. He has no wheezes. He has no rales.  Abdominal: Soft. Bowel sounds are normal. He exhibits no distension. There is no tenderness.  Musculoskeletal: He exhibits no edema.    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 Right lateral foot with 2 cm stage II ulcer along 5th MT--clean and without change. Left toes with loss of toe nails.  Lymphadenopathy:  He has no cervical adenopathy.  Neurological: He is alert. He displays abnormal reflex. Coordination abnormal.  Flat affect but no signs of distress. Makes  eye contact. Improved processing and speech.  Better insight as a whole. Recalls biographical information. Oriented to self and to rehab hospital. Remembered events from yesterday.      UES grossly 4/5. LE HF is 2-3/5 and is grossly 4/5 distally. RLE limited by wound/boot. Stocking glove sensory loss below the knees.  Skin: Skin is warm and dry.  Psychiatric: Judgment and thought content normal. His affect is improving. Thought processing quicker and more appropriate. Memory is better  Assessment/Plan: 1. 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 Physiatrist is providing close team supervision and 24 hour management of active medical problems listed below. Physiatrist and rehab team continue to assess barriers to discharge/monitor patient progress toward functional and medical goals.   FIM: FIM - Bathing Bathing Steps Patient Completed: Chest;Right Arm;Left Arm;Abdomen;Front perineal area;Right upper leg;Left upper leg;Left lower leg (including foot);Right lower leg (including foot) Bathing: 5: Supervision: Safety issues/verbal cues  FIM - Upper Body Dressing/Undressing Upper body dressing/undressing steps patient completed: Thread/unthread right sleeve of pullover shirt/dresss;Thread/unthread left sleeve of pullover shirt/dress;Put head through opening of pull over shirt/dress;Pull shirt over trunk Upper body dressing/undressing: 5: Set-up assist to: Obtain clothing/put away FIM - Lower Body Dressing/Undressing Lower body dressing/undressing steps patient completed: Thread/unthread right pants leg;Thread/unthread left pants leg;Pull pants up/down;Don/Doff left sock;Don/Doff left shoe;Fasten/unfasten pants;Fasten/unfasten left shoe Lower body dressing/undressing: 4: Steadying Assist  FIM - Toileting Toileting steps completed by patient: Performs perineal Biomedical engineer  Devices:  Grab bar or rail for support Toileting: 4: Steadying assist  FIM - Diplomatic Services operational officer Devices: Therapist, music Transfers: 4-To toilet/BSC: Min A (steadying Pt. > 75%);4-From toilet/BSC: Min A (steadying Pt. > 75%)  FIM - Bed/Chair Transfer Bed/Chair Transfer Assistive Devices: Therapist, occupational: 5: Chair or W/C > Bed: Supervision (verbal cues/safety issues);5: Bed > Chair or W/C: Supervision (verbal cues/safety issues)  FIM - Locomotion: Wheelchair Distance: 150 Locomotion: Wheelchair: 5: Travels 150 ft or more: maneuvers on rugs and over door sills with supervision, cueing or coaxing FIM - Locomotion: Ambulation Locomotion: Ambulation Assistive Devices: Designer, industrial/product Ambulation/Gait Assistance: 4: Min assist Locomotion: Ambulation: 5: Travels 150 ft or more with supervision/safety issues  Comprehension Comprehension Mode: Auditory Comprehension: 6-Follows complex conversation/direction: With extra time/assistive device  Expression Expression Mode: Verbal Expression: 5-Expresses basic 90% of the time/requires cueing < 10% of the time.  Social Interaction Social Interaction: 5-Interacts appropriately 90% of the time - Needs monitoring or encouragement for participation or interaction.  Problem Solving Problem Solving: 5-Solves basic 90% of the time/requires cueing < 10% of the time  Memory Memory: 4-Recognizes or recalls 75 - 89% of the time/requires cueing 10 - 24% of the time  Medical Problem List and Plan:  1. DVT Prophylaxis/Anticoagulation: Pharmaceutical: Lovenox  2. Pain Management: N/A  3. Mood: No signs of distress noted and patient expressing need to get better.Will have LCSW and psychologist follow up for evaluation and support. Provide ego support. Continue lexapro  4. Neuropsych: This patient is not capable of making decisions on his own behalf.  5. DM type 2 with peripheral neuropathy:  . Po intake improved. Monitor BS with  AC./HS cbg checks. Continue to adjust levemir with novolog for meal coverage. increased lunchtime covg to 8 u.  -watch for hypoglycemia 6. Chronic neurogenic right foot ulcer: Pressure relief measures when in bed--CAM boot out of bed. Continue Cipro and doxycycline indefinitely due to chronic infection.  Wound with slow/minimal healing Plans for graft in the future? 7. Urinary retention:  Improving--off meds 8. Bowel incontinence:  Probiotic. Marland Kitchen 9. FEN: app much better  -RD follow up   -megace stopped 10. Hypotension--encourage fluids, stopped lisinopril---denies symptoms  -better yesterday---follow for am hypertension  LOS (Days) 12 A FACE TO FACE EVALUATION WAS PERFORMED  SWARTZ,ZACHARY T 09/10/2012 7:45 AM

## 2012-09-10 NOTE — Progress Notes (Signed)
Physical Therapy Note  Patient Details  Name: Thomas Frye MRN: [id number] Date of Birth: 1956-01-06 Today's Date: 09/10/2012  Time: 900-955 55 minutes  1:1 No c/o pain.  Gait with RW in controlled environment with supervision (L AFO donned).  Improved step through pattern and toe clearance with AFO.  Gait with obstacle negotiation, stepping over objects with supervision, good safety awareness.  Ramp and curb training with RW with cuing for sequencing, supervision assist with pt able to self correct LOB with RW.  Standing balance without AD with steps fwd/bkwd with min A.  Floor transfers and education on fall recovery.  Pt able to perform floor transfer with supervision.  Pt verbalizes understanding of home safety recommendations and safety if he falls at home.  Home exercises discussed and pt able to demo and verbalize understanding of HEP for home.   Sterling Ucci 09/10/2012, 9:51 AM

## 2012-09-10 NOTE — Progress Notes (Signed)
Occupational Therapy Session Note  Patient Details  Name: [name] MRN: [id number] Date of Birth: 1955/06/12  Today's Date: 09/10/2012 Time: 4098-1191 Time Calculation (min): 28 min   Skilled Therapeutic Interventions/Progress Updates:    Pt worked on Tree surgeon using golf game during session.  Pt overall needing min assist for balance in order to keep from falling posteriorly when standing statically and attempting to address the golf ball.  Also worked on short distance mobility to pick up golf balls from the floor after putting them.  Pt expressed joy in being able to walk some without the walker even though he knows he will need it at home.    Therapy Documentation Precautions:  Precautions Precautions: Fall Required Braces or Orthoses: Other Brace/Splint Other Brace/Splint: cam boot R ankle OOB Restrictions Weight Bearing Restrictions: No Other Position/Activity Restrictions: Has blue PRAFO for in the bed.    Pain:  Pt reports no pain  See FIM for current functional status  Therapy/Group: Individual Therapy  Naeemah Jasmer OTR/L 09/10/2012, 3:47 PM

## 2012-09-11 ENCOUNTER — Inpatient Hospital Stay (HOSPITAL_COMMUNITY): Payer: BC Managed Care – PPO | Admitting: Speech Pathology

## 2012-09-11 ENCOUNTER — Encounter (HOSPITAL_COMMUNITY): Payer: BC Managed Care – PPO | Admitting: Occupational Therapy

## 2012-09-11 ENCOUNTER — Inpatient Hospital Stay (HOSPITAL_COMMUNITY): Payer: BC Managed Care – PPO | Admitting: Physical Therapy

## 2012-09-11 ENCOUNTER — Inpatient Hospital Stay (HOSPITAL_COMMUNITY): Payer: BC Managed Care – PPO | Admitting: *Deleted

## 2012-09-11 DIAGNOSIS — E1142 Type 2 diabetes mellitus with diabetic polyneuropathy: Secondary | ICD-10-CM

## 2012-09-11 DIAGNOSIS — IMO0001 Reserved for inherently not codable concepts without codable children: Secondary | ICD-10-CM

## 2012-09-11 DIAGNOSIS — G931 Anoxic brain damage, not elsewhere classified: Secondary | ICD-10-CM

## 2012-09-11 DIAGNOSIS — E1165 Type 2 diabetes mellitus with hyperglycemia: Secondary | ICD-10-CM

## 2012-09-11 LAB — GLUCOSE, CAPILLARY
Glucose-Capillary: 183 mg/dL — ABNORMAL HIGH (ref 70–99)
Glucose-Capillary: 178 mg/dL — ABNORMAL HIGH (ref 70–99)

## 2012-09-11 NOTE — Progress Notes (Signed)
Recreational Therapy Session Note  Patient Details  Name: FLYNT BREEZE MRN: [id number] Date of Birth: 09-11-1955 Today's Date: 09/11/2012 Time:  3086-5784 Pain: no c/o Skilled Therapeutic Interventions/Progress Updates: Session focused on discussion about purpose of community reintegration/outing, potential goals, & energy conservation techniques.  Pt agreeable to participate in lunch outing to be scheduled for tomorrow.  Therapy/Group: Individual Therapy   Eberardo Demello 09/11/2012, 2:45 PM

## 2012-09-11 NOTE — Plan of Care (Signed)
Problem: RH SKIN INTEGRITY Goal: RH STG SKIN FREE OF INFECTION/BREAKDOWN Skin free of infection/breakdown with mod  Outcome: Not Progressing Requires max to total assist to change foot dressing

## 2012-09-11 NOTE — Progress Notes (Signed)
Physical Therapy Note  Patient Details  Name: ALY HAUSER MRN: [id number] Date of Birth: [birth year] Today's Date: 09/11/2012  Time: 900-957 57 minutes  1:1 No c/o pain.  Treatment session focused on pt/family education with pt's wife.  Pt's wife aware of supervision status and provided appropriate supervision for pt during gait with RW, transfers, car transfers, ramp/curb negotiation, stair negotiation and dynamic standing balance.  Pt's wife educated on and safely demo'd min A level gait for simulation of uneven terrain or busy community environment.  Standing balance training without AD with golf putting game with pt able to perform at min A.  Pt with much improved activity tolerance, able to gait > 150' multiple times throughout session, less frequent need for rest breaks.  Pt and wife express understanding of recommendation for supervision with gait and mobility at home as well as recommendation for continued PT follow up.  Pt/wife educated on importance of skin checks and wear schedule for new L AFO.   Stephanie Mcglone 09/11/2012, 9:55 AM

## 2012-09-11 NOTE — Progress Notes (Signed)
Speech Language Pathology Daily Session Note  Patient Details  Name: Thomas Frye MRN: [id number] Date of Birth: 1955-12-26  Today's Date: 09/11/2012 Time: 1105-1130 Time Calculation (min): 25 min  Short Term Goals: Week 2: SLP Short Term Goal 1 (Week 2): Pt will self-monitor and correct verbal errors at the sentence level with supervision verbal and question cues.  SLP Short Term Goal 2 (Week 2): Pt will utilize external memory aids to increase recall/carryover of newly learned information with supervision verbal and question cues.  SLP Short Term Goal 3 (Week 2): Pt will demonstrate functional problem solving with basic and familiar tasks with supervision verbal and question cues.  SLP Short Term Goal 4 (Week 2): Pt will demonstrate selective attention to task for 30 miutes with supervision verbal cues for redirection.   Skilled Therapeutic Interventions: Pt. seen for cognitive treatment.  Pt. engaged in 2 activities without cues needed for selective attention (door open and additional SLP in room).  He demonstrated working memory with supervision cues and retained rules for card game he learned 10 minutes prior.  Pt. also stated procedure/rules of game at end of session.   FIM:  Comprehension Comprehension: 6-Follows complex conversation/direction: With extra time/assistive device Expression Expression Mode: Verbal Expression: 6-Expresses complex ideas: With extra time/assistive device Social Interaction Social Interaction: 6-Interacts appropriately with others with medication or extra time (anti-anxiety, antidepressant). Problem Solving Problem Solving: 5-Solves basic problems: With no assist Memory Memory: 4-Recognizes or recalls 75 - 89% of the time/requires cueing 10 - 24% of the time  Pain Pain Assessment Pain Assessment: No/denies pain  Therapy/Group: Individual Therapy  [name] 09/11/2012, 12:46 PM

## 2012-09-11 NOTE — Progress Notes (Signed)
Patient ID: Thomas Frye, male   DOB: 1955-05-04, 57 y.o.   MRN: [id number] Subjective/Complaints: No issues. Pain is controlled. Left shoe rubbing on foot. A 12 point review of systems has been performed and if not noted above is otherwise negative.   Objective: Vital Signs: Blood pressure 155/90, pulse 85, temperature 98.3 F (36.8 C), temperature source Oral, resp. rate 19, height 6\' 4"  (1.93 m), weight 124.1 kg (273 lb 9.5 oz), SpO2 95.00%. No results found. No results found for this basename: WBC, HGB, HCT, PLT,  in the last 72 hours No results found for this basename: NA, K, CL, CO, GLUCOSE, BUN, CREATININE, CALCIUM,  in the last 72 hours CBG (last 3)   Recent Labs  09/10/12 1641 09/10/12 2102 09/11/12 0732  GLUCAP 146* 177* 183*    Wt Readings from Last 3 Encounters:  09/05/12 124.1 kg (273 lb 9.5 oz)  08/22/12 126.4 kg (278 lb 10.6 oz)  12/10/10 129.729 kg (286 lb)    Physical Exam:  Constitutional: He appears well-developed and well-nourished.  HENT:  Head: Normocephalic and atraumatic.  Right Ear: External ear normal.  Left Ear: External ear normal.  Eyes: Conjunctivae and EOM are normal. Pupils are equal, round, and reactive to light.  Neck: Normal range of motion. No JVD present. No tracheal deviation present. No thyromegaly present.  Cardiovascular: Normal rate and regular rhythm.  Pulmonary/Chest: Effort normal and breath sounds normal. No respiratory distress. He has no wheezes. He has no rales.  Abdominal: Soft. Bowel sounds are normal. He exhibits no distension. There is no tenderness.  Musculoskeletal: He exhibits no edema.    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 Right lateral foot with 2 cm stage II ulcer along 5th MT--clean and without change. Left toes with loss of toe nails.  Lymphadenopathy:  He has no cervical adenopathy.  Neurological: He is alert. He displays abnormal reflex. Coordination abnormal.  Flat affect but no  signs of distress. Makes eye contact. Improved processing and speech.  Better insight as a whole. Recalls biographical information. Oriented to self and to rehab hospital. Remembered events from yesterday.      UES grossly 4/5. LE HF is 2-3/5 and is grossly 4/5 distally. RLE limited by wound/boot. Stocking glove sensory loss below the knees.  Skin: Skin is warm and dry.  Psychiatric: Judgment and thought content normal. His affect is improving. Thought processing quicker and more appropriate. Memory is better  Assessment/Plan: 1. 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 Physiatrist is providing close team supervision and 24 hour management of active medical problems listed below. Physiatrist and rehab team continue to assess barriers to discharge/monitor patient progress toward functional and medical goals.   FIM: FIM - Bathing Bathing Steps Patient Completed: Chest;Right Arm;Left Arm;Right upper leg;Left upper leg;Abdomen;Buttocks;Front perineal area;Right lower leg (including foot);Left lower leg (including foot) (remains seated) Bathing: 5: Set-up assist to: Adjust water temp  FIM - Upper Body Dressing/Undressing Upper body dressing/undressing steps patient completed: Thread/unthread right sleeve of pullover shirt/dresss;Thread/unthread left sleeve of pullover shirt/dress;Put head through opening of pull over shirt/dress;Pull shirt over trunk Upper body dressing/undressing: 5: Set-up assist to: Obtain clothing/put away FIM - Lower Body Dressing/Undressing Lower body dressing/undressing steps patient completed: Thread/unthread right pants leg;Thread/unthread left pants leg;Pull pants up/down;Don/Doff left sock;Don/Doff left shoe;Fasten/unfasten pants;Fasten/unfasten left shoe Lower body dressing/undressing: 5: Set-up assist to: Don/Doff AFO/prosthesis/orthosis  FIM - Toileting  Toileting  steps completed by patient: Performs perineal hygiene Toileting Assistive Devices: Grab bar or rail for support Toileting: 4: Steadying assist  FIM - Diplomatic Services operational officer Devices: Grab bars Toilet Transfers: 4-To toilet/BSC: Min A (steadying Pt. > 75%);4-From toilet/BSC: Min A (steadying Pt. > 75%)  FIM - Bed/Chair Transfer Bed/Chair Transfer Assistive Devices: Therapist, occupational: 5: Bed > Chair or W/C: Supervision (verbal cues/safety issues);5: Chair or W/C > Bed: Supervision (verbal cues/safety issues)  FIM - Locomotion: Wheelchair Distance: 150 Locomotion: Wheelchair: 5: Travels 150 ft or more: maneuvers on rugs and over door sills with supervision, cueing or coaxing FIM - Locomotion: Ambulation Locomotion: Ambulation Assistive Devices: Designer, industrial/product Ambulation/Gait Assistance: 4: Min assist Locomotion: Ambulation: 5: Travels 150 ft or more with supervision/safety issues  Comprehension Comprehension Mode: Auditory Comprehension: 6-Follows complex conversation/direction: With extra time/assistive device  Expression Expression Mode: Verbal Expression: 5-Expresses basic needs/ideas: With extra time/assistive device  Social Interaction Social Interaction: 6-Interacts appropriately with others with medication or extra time (anti-anxiety, antidepressant).  Problem Solving Problem Solving Mode: Asleep Problem Solving: 6-Solves complex problems: With extra time  Memory Memory: 4-Recognizes or recalls 75 - 89% of the time/requires cueing 10 - 24% of the time  Medical Problem List and Plan:  1. DVT Prophylaxis/Anticoagulation: Pharmaceutical: Lovenox  2. Pain Management: N/A  3. Mood: No signs of distress noted and patient expressing need to get better.Will have LCSW and psychologist follow up for evaluation and support. Provide ego support. Continue lexapro  4. Neuropsych: This patient is not capable of making decisions on his own behalf.  5. DM  type 2 with peripheral neuropathy:  . Po intake improved. Monitor BS with AC./HS cbg checks. Continue to adjust levemir with novolog for meal coverage. increased lunchtime covg to 8 u. 6. Chronic neurogenic right foot ulcer: Pressure relief measures when in bed--CAM boot out of bed. Continue Cipro and doxycycline indefinitely due to chronic infection.  Wound with slow/minimal healing Plans for graft in the future?  -requested [employer] eva/fit for diabetic left shoe 7. Urinary retention:  Improving--off meds 8. Bowel incontinence:  Probiotic. Marland Kitchen 9. FEN: app much better  -RD follow up   -megace stopped 10. Hypotension--encourage fluids, stopped lisinopril---denies symptoms  -better yesterday---follow for am hypertension  LOS (Days) 13 A FACE TO FACE EVALUATION WAS PERFORMED  Baruc Tugwell T 09/11/2012 7:46 AM

## 2012-09-11 NOTE — Progress Notes (Signed)
Occupational Therapy Session Note  Patient Details  Name: Thomas Frye MRN: [id number] Date of Birth: 1955/04/03  Today's Date: 09/11/2012 Time: 1000-1053 Time Calculation (min): 53 min  Short Term Goals: Week 2:  OT Short Term Goal 1 (Week 2): LTG= STG  Skilled Therapeutic Interventions/Progress Updates:    continued family education with pt's wife with them carrying out established morning rtouine including showering, dressing and dressing foot wound. Performed shower down in ADL apartment using tub bench for transfer. Wife and pt feel good about their plans for d/c and how well pt is progressing. Continued education about AFO and foot care.   Therapy Documentation Precautions:  Precautions Precautions: Fall Required Braces or Orthoses: Other Brace/Splint Other Brace/Splint: cam boot R ankle OOB Restrictions Weight Bearing Restrictions: No Other Position/Activity Restrictions: Has blue PRAFO for in the bed.   Pain:  no c/o pain   See FIM for current functional status  Therapy/Group: Individual Therapy  Roney Mans [facility] 09/11/2012, 11:30 AM

## 2012-09-11 NOTE — Plan of Care (Signed)
Problem: RH SKIN INTEGRITY Goal: RH STG ABLE TO PERFORM INCISION/WOUND CARE W/ASSISTANCE STG Able To Perform Incision/Wound Care With max Assistance.  Outcome: Not Progressing Requires total assist for wound care .

## 2012-09-11 NOTE — Progress Notes (Signed)
Pt has home machine but isn't wearing at this time. Pt has refused our machine.

## 2012-09-11 NOTE — Progress Notes (Signed)
Physical Therapy Session Note  Patient Details  Name: DOREAN DANIELLO MRN: [id number] Date of Birth: 04-Aug-1955  Today's Date: 09/11/2012 Time: 1400-1445 Time Calculation (min): 45 min  Short Term Goals: Week 2:     Skilled Therapeutic Interventions/Progress Updates:    Pt reports being pleased with overall progress.  Pt received L AFO and skin integrity inspected to monitor for any red areas or pressure, none noted. Pt seen for gait training with RW for 175 feet  x2 with supervision assist with pt demonstrating improved gait pattern and pt reports it being easier to walk with new L AFO.  Pt performed static and dynamic balance activities with feet in various positions with UE supported with one hand and without UE support with pt displaying mild LOB with self recovery with one foot on 6 inch step and with semi tandem. Pt able to maintain 10 mins of static unsupported standing while engaging in conversation and performing matching task with cards. Pt also performed transfers to various surfaces to improve hand and foot placement and sequencing.    Therapy Documentation Precautions:  Precautions Precautions: Fall Required Braces or Orthoses: Other Brace/Splint (AFO L LE and Cam boot R LE) Other Brace/Splint:  (AFO L LE and Cam boot R LE) Restrictions Weight Bearing Restrictions: No Other Position/Activity Restrictions: Has blue PRAFO for in the bed.          See FIM for current functional status  Therapy/Group: Individual Therapy  Jackelyn Knife 09/11/2012, 4:02 PM

## 2012-09-11 NOTE — Progress Notes (Signed)
Orthopedic Tech Progress Note Patient Details:  Thomas Frye [birth year] [id number] [facility] called for brace order Patient ID: [name], male   DOB: [birth year], 57 y.o.   MRN: [id number]   [name] 09/11/2012, 11:22 AM

## 2012-09-12 ENCOUNTER — Inpatient Hospital Stay (HOSPITAL_COMMUNITY): Payer: BC Managed Care – PPO | Admitting: Occupational Therapy

## 2012-09-12 ENCOUNTER — Encounter (HOSPITAL_COMMUNITY): Payer: BC Managed Care – PPO | Admitting: Occupational Therapy

## 2012-09-12 ENCOUNTER — Inpatient Hospital Stay (HOSPITAL_COMMUNITY): Payer: BC Managed Care – PPO | Admitting: Speech Pathology

## 2012-09-12 ENCOUNTER — Inpatient Hospital Stay (HOSPITAL_COMMUNITY): Payer: BC Managed Care – PPO | Admitting: Physical Therapy

## 2012-09-12 DIAGNOSIS — E1165 Type 2 diabetes mellitus with hyperglycemia: Secondary | ICD-10-CM

## 2012-09-12 DIAGNOSIS — E1142 Type 2 diabetes mellitus with diabetic polyneuropathy: Secondary | ICD-10-CM

## 2012-09-12 DIAGNOSIS — IMO0001 Reserved for inherently not codable concepts without codable children: Secondary | ICD-10-CM

## 2012-09-12 DIAGNOSIS — G931 Anoxic brain damage, not elsewhere classified: Secondary | ICD-10-CM

## 2012-09-12 LAB — GLUCOSE, CAPILLARY
Glucose-Capillary: 189 mg/dL — ABNORMAL HIGH (ref 70–99)
Glucose-Capillary: 218 mg/dL — ABNORMAL HIGH (ref 70–99)

## 2012-09-12 MED ORDER — LIRAGLUTIDE 18 MG/3ML ~~LOC~~ SOPN: 1.2000 mg | PEN_INJECTOR | Freq: Every day | SUBCUTANEOUS | Status: DC

## 2012-09-12 NOTE — Progress Notes (Signed)
Occupational Therapy Discharge Summary  Patient Details  Name: Thomas Frye MRN: [id number] Date of Birth: Jul 16, 1955  Today's Date: 09/12/2012 Time: 4098-1191 Time Calculation (min): 40 min  GRAD DAY: self care retraining at shower level with focus on dynamic standing balance, safe basic transfers, sit to stand, safety with mobility, d/c planning and discussion about pm outing.  2nd session Outing with Rec therapy 11:00-13:00 Therapeutic community outing with Focus on functional community mobility including ambulation with RW, navigating around obstacles, sit to stands from different surfaces, standing balance, problem solving how to obtain items at a restaurant, negotiating a curb step, ambulation on uneven surfaces in side and outside, simple mental math related to restaurant bills.   Patient has met 12 of 12 long term goals due to improved activity tolerance, improved balance, postural control, ability to compensate for deficits, functional use of  RIGHT upper, RIGHT lower, LEFT upper and LEFT lower extremity, improved attention, improved awareness and improved coordination.  Patient to discharge at overall Supervision level.  Patient's care partner, pt's wife, is independent to provide the necessary physical and cognitive assistance at discharge.    Reasons goals not met: n/a  Recommendation:  No f/u OT  Equipment: tub bench and RW  Reasons for discharge: treatment goals met and discharge from hospital  Patient/family agrees with progress made and goals achieved: Yes  OT Discharge Precautions/Restrictions  Precautions Precautions: Fall Required Braces or Orthoses: Other Brace/Splint Other Brace/Splint: CAM Boot on right LE and AFO on left with frequent skin checks Restrictions Weight Bearing Restrictions: No    Vital Signs Therapy Vitals BP: 120/80 mmHg Pain Pain Assessment Pain Assessment: No/denies pain ADL  see FIM Vision/Perception  Vision -  History Baseline Vision: Wears glasses for distance only Patient Visual Report: No change from baseline Vision - Assessment Eye Alignment: Within Functional Limits Tracking/Visual Pursuits: Able to track stimulus in all quads without difficulty Saccades: Within functional limits Perception Perception: Within Functional Limits Praxis Praxis: Impaired Praxis Impairment Details: Motor planning Praxis-Other Comments: Pt with poor left foot control- and foot drop present   Cognition Overall Cognitive Status: Impaired/Different from baseline Arousal/Alertness: Awake/alert Orientation Level: Oriented X4 Attention: Selective Selective Attention: Appears intact Memory: Impaired Memory Impairment: Decreased recall of new information Awareness: Impaired Awareness Impairment: Anticipatory impairment Problem Solving: Impaired Problem Solving Impairment: Functional complex Executive Function: Initiating;Decision Making Decision Making: Impaired Decision Making Impairment: Functional complex Initiating: Impaired Initiating Impairment: Functional complex Safety/Judgment: Appears intact Comments: Pt still requires extra time to process information; especially new information Sensation Sensation Light Touch: Impaired Detail Light Touch Impaired Details: Impaired LLE;Impaired RLE Proprioception: Impaired Detail Proprioception Impaired Details: Absent LLE;Absent RLE Coordination Gross Motor Movements are Fluid and Coordinated: No Coordination and Movement Description: continues to have left foot drop and poor coordination Motor  Motor Motor: Within Functional Limits Motor - Skilled Clinical Observations: L foot drop Mobility  Transfers Sit to Stand: 5: Supervision Stand to Sit: 5: Supervision  Trunk/Postural Assessment  Cervical Assessment Cervical Assessment: Within Functional Limits Thoracic Assessment Thoracic Assessment: Within Functional Limits Lumbar Assessment Lumbar  Assessment: Within Functional Limits Postural Control Postural Control: Deficits on evaluation Righting Reactions: delayed; but improved  Balance Static Sitting Balance Static Sitting - Level of Assistance: 6: Modified independent (Device/Increase time) Dynamic Sitting Balance Dynamic Sitting - Level of Assistance: 6: Modified independent (Device/Increase time) Dynamic Standing Balance Dynamic Standing - Level of Assistance: 5: Stand by assistance;4: Min assist Extremity/Trunk Assessment RUE Assessment RUE Assessment: Within Functional Limits LUE Assessment LUE  Assessment: Within Functional Limits  See FIM for current functional status  Roney Mans [facility] 09/12/2012, 10:25 AM

## 2012-09-12 NOTE — Progress Notes (Signed)
Recreational Therapy Session Note  Patient Details  Name: Thomas Frye MRN: [id number] Date of Birth: 1955-05-28 Today's Date: 09/12/2012 Time:  1100-1300 Pain: no c/o Skilled Therapeutic Interventions/Progress Updates: Pt participated in community reintegration/outing to [employer] ambulatory level using RW with supervision.  Session focused on community mobility on level/unlevel indoor/outdoor surfaces, identification & negotiation of obstacles, energy conservation techniques, and simple math.  Therapy/Group: [employer]   Irving Bloor 09/12/2012, 5:14 PM

## 2012-09-12 NOTE — Progress Notes (Signed)
Orthopedic Tech Progress Note Patient Details:  Thomas Frye 05/19/1955 [id number]  Patient ID: [name], male   DOB: [birth year], 57 y.o.   MRN: [id number]   [name] 09/12/2012, 9:07 AM CALLED [facility] FOR DIABETIC SHOE.

## 2012-09-12 NOTE — Progress Notes (Signed)
Patient ID: EASTIN SWING, male   DOB: [birth year], 57 y.o.   MRN: [id number] Subjective/Complaints: No complaints. Feels well A 12 point review of systems has been performed and if not noted above is otherwise negative.   Objective: Vital Signs: Blood pressure 120/80, pulse 87, temperature 98 F (36.7 C), temperature source Oral, resp. rate 20, height 6\' 4"  (1.93 m), weight 124.1 kg (273 lb 9.5 oz), SpO2 96.00%. No results found. No results found for this basename: WBC, HGB, HCT, PLT,  in the last 72 hours  Recent Labs  09/12/12 0605  CREATININE 0.90   CBG (last 3)   Recent Labs  09/11/12 1632 09/11/12 2036 09/12/12 0712  GLUCAP 135* 178* 189*    Wt Readings from Last 3 Encounters:  09/05/12 124.1 kg (273 lb 9.5 oz)  08/22/12 126.4 kg (278 lb 10.6 oz)  12/10/10 129.729 kg (286 lb)    Physical Exam:  Constitutional: He appears well-developed and well-nourished.  HENT:  Head: Normocephalic and atraumatic.  Right Ear: External ear normal.  Left Ear: External ear normal.  Eyes: Conjunctivae and EOM are normal. Pupils are equal, round, and reactive to light.  Neck: Normal range of motion. No JVD present. No tracheal deviation present. No thyromegaly present.  Cardiovascular: Normal rate and regular rhythm.  Pulmonary/Chest: Effort normal and breath sounds normal. No respiratory distress. He has no wheezes. He has no rales.  Abdominal: Soft. Bowel sounds are normal. He exhibits no distension. There is no tenderness.  Musculoskeletal: He exhibits no edema.    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 Right lateral foot with 2 cm stage II ulcer along 5th MT--clean and without change. Left toes with loss of toe nails.  Lymphadenopathy:  He has no cervical adenopathy.  Neurological: He is alert. He displays abnormal reflex. Coordination abnormal.  Flat affect but no signs of distress. Makes eye contact. Improved processing and speech.  Better insight as a  whole. Recalls biographical information. Oriented to self and to rehab hospital. Remembered events from yesterday.      UES grossly 4/5. LE HF is 2-3/5 and is grossly 4/5 distally. RLE limited by wound/boot. Stocking glove sensory loss below the knees.  Skin: Skin is warm and dry.  Psychiatric: Judgment and thought content normal. His affect is improving. Thought processing quicker and more appropriate. Memory is better  Assessment/Plan: 1. 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 Physiatrist is providing close team supervision and 24 hour management of active medical problems listed below. Physiatrist and rehab team continue to assess barriers to discharge/monitor patient progress toward functional and medical goals.   FIM: FIM - Bathing Bathing Steps Patient Completed: Chest;Right Arm;Left Arm;Right upper leg;Left upper leg;Abdomen;Buttocks;Front perineal area;Right lower leg (including foot);Left lower leg (including foot) (remains seated) Bathing: 5: Set-up assist to: Adjust water temp  FIM - Upper Body Dressing/Undressing Upper body dressing/undressing steps patient completed: Thread/unthread right sleeve of pullover shirt/dresss;Thread/unthread left sleeve of pullover shirt/dress;Put head through opening of pull over shirt/dress;Pull shirt over trunk Upper body dressing/undressing: 5: Set-up assist to: Obtain clothing/put away FIM - Lower Body Dressing/Undressing Lower body dressing/undressing steps patient completed: Thread/unthread right pants leg;Thread/unthread left pants leg;Pull pants up/down;Don/Doff left sock;Don/Doff left shoe;Fasten/unfasten pants;Fasten/unfasten left shoe Lower body dressing/undressing: 5: Set-up assist to: Don/Doff AFO/prosthesis/orthosis  FIM - Toileting Toileting steps completed by patient: Performs perineal hygiene Toileting Assistive Devices: Grab bar  or  rail for support Toileting: 4: Steadying assist  FIM - Diplomatic Services operational officer Devices: Grab bars Toilet Transfers: 4-To toilet/BSC: Min A (steadying Pt. > 75%);4-From toilet/BSC: Min A (steadying Pt. > 75%)  FIM - Bed/Chair Transfer Bed/Chair Transfer Assistive Devices: Therapist, occupational: 5: Chair or W/C > Bed: Supervision (verbal cues/safety issues)  FIM - Locomotion: Wheelchair Distance: 150 Locomotion: Wheelchair: 5: Travels 150 ft or more: maneuvers on rugs and over door sills with supervision, cueing or coaxing FIM - Locomotion: Ambulation Locomotion: Ambulation Assistive Devices: Designer, industrial/product Ambulation/Gait Assistance: 4: Min assist Locomotion: Ambulation: 5: Travels 150 ft or more with supervision/safety issues  Comprehension Comprehension Mode: Auditory Comprehension: 6-Follows complex conversation/direction: With extra time/assistive device  Expression Expression Mode: Verbal Expression: 7-Expresses complex ideas: With no assist  Social Interaction Social Interaction: 6-Interacts appropriately with others with medication or extra time (anti-anxiety, antidepressant).  Problem Solving Problem Solving Mode: Asleep Problem Solving: 5-Solves basic problems: With no assist  Memory Memory: 4-Recognizes or recalls 75 - 89% of the time/requires cueing 10 - 24% of the time  Medical Problem List and Plan:  1. DVT Prophylaxis/Anticoagulation: Pharmaceutical: Lovenox  2. Pain Management: N/A  3. Mood: No signs of distress noted and patient expressing need to get better.Will have LCSW and psychologist follow up for evaluation and support. Provide ego support. Continue lexapro  4. Neuropsych: This patient is not capable of making decisions on his own behalf.  5. DM type 2 with peripheral neuropathy:  . Po intake improved. Monitor BS with AC./HS cbg checks. Continue to adjust levemir with novolog for meal coverage. Scheduled mealtime 6. Chronic  neurogenic right foot ulcer: Pressure relief measures when in bed--CAM boot out of bed. Continue Cipro and doxycycline indefinitely due to chronic infection.  Wound with slow/minimal healing Plans for graft in the future?  - [facility] eva/fit for diabetic left shoe 7. Urinary retention:  Improving--off meds 8. Bowel incontinence:  Probiotic. Marland Kitchen 9. FEN: app much better  -RD follow up   -megace stopped 10. Hypotension--encourage fluids, stopped lisinopril---denies symptoms  -tolerate some elevation of bp for now  LOS (Days) 14 A FACE TO FACE EVALUATION WAS PERFORMED  SWARTZ,ZACHARY T 09/12/2012 8:07 AM

## 2012-09-12 NOTE — Progress Notes (Signed)
Orthopedic Tech Progress Note Patient Details:  Thomas Frye [birth year] [id number]  Patient ID: [name], male   DOB: 07-29-1955, 57 y.o.   MRN: [id number]   [name] 09/12/2012, 11:25 AM called advanced for left AFO brace.

## 2012-09-12 NOTE — Progress Notes (Signed)
Speech Language Pathology Daily Session Note  Patient Details  Name: Thomas Frye MRN: [id number] Date of Birth: March 24, 1956  Today's Date: 09/12/2012 Time:  0730-0815              45 minutes  Short Term Goals: Week 2: SLP Short Term Goal 1 (Week 2): Pt will self-monitor and correct verbal errors at the sentence level with supervision verbal and question cues.  SLP Short Term Goal 2 (Week 2): Pt will utilize external memory aids to increase recall/carryover of newly learned information with supervision verbal and question cues.  SLP Short Term Goal 3 (Week 2): Pt will demonstrate functional problem solving with basic and familiar tasks with supervision verbal and question cues.  SLP Short Term Goal 4 (Week 2): Pt will demonstrate selective attention to task for 30 miutes with supervision verbal cues for redirection.   Skilled Therapeutic Interventions: Therapy for cognition included areas of working memory, selective attention and problem solving.  Pt. unable to state what occured as result of his infury.  SLP reviewed chart and provided verbal cues/reminders of right CVA.  Pt. stated rules of card game SLP taugt pt. yesterday with supervision cues.  He required modified independence cues to take notes from dictation for working memory.  Pt.  attempted to check personal email but was unable to recall password.  He demonstrated appropriate problem solving skills by attempting to request new password.  Pt. was independent in locating items on [employer] website given a budget.   FIM:  Comprehension Comprehension: 5-Understands complex 90% of the time/Cues < 10% of the time Expression Expression Mode: Verbal Expression: 5-Expresses complex 90% of the time/cues < 10% of the time Social Interaction Social Interaction: 6-Interacts appropriately with others with medication or extra time (anti-anxiety, antidepressant). Problem Solving Problem Solving: 5-Solves complex 90% of the  time/cues < 10% of the time Memory Memory: 4-Recognizes or recalls 75 - 89% of the time/requires cueing 10 - 24% of the time  Pain Pain Assessment Pain Assessment: No/denies pain  Therapy/Group: Individual Therapy  [name] 09/12/2012, 8:21 AM

## 2012-09-12 NOTE — Progress Notes (Signed)
Patient has home CPAP unit. RT will monitor.

## 2012-09-12 NOTE — Progress Notes (Signed)
Orthopedic Tech Progress Note Patient Details:  Thomas Frye March 20, 1956 [id number]  Patient ID: [name], male   DOB: 31-Dec-1955, 57 y.o.   MRN: [id number]   [name] 09/12/2012, 4:11 PM ortho shoe completed by advanced.

## 2012-09-12 NOTE — Progress Notes (Signed)
Physical Therapy Note  Patient Details  Name: Thomas Frye MRN: [id number] Date of Birth: [birth year] Today's Date: 09/12/2012  Time: 1330-1356 26 minutes  1:1 No c/o pain.  Pt performed gait in home and controlled environments with supervision with RW.  Stair negotiation with B handrails with supervision x 15 steps.  Standing balance without UE support for horseshoe reach and toss with min A required for 1 LOB when fatigued, able to stand 7.5 minutes without seated rest.  Pt reports he feels comfortable with d/c home tomorrow.   Enjoli Tidd 09/12/2012, 1:57 PM

## 2012-09-12 NOTE — Patient Care Conference (Signed)
Inpatient RehabilitationTeam Conference and Plan of Care Update Date: 09/11/2012   Time: 2:45 PM    Patient Name: Thomas Frye      Medical Record Number: [id number]  Date of Birth: [birth year] Sex: Male         Room/Bed: 4001/4001-01 Payor Info: Payor: BLUE CROSS BLUE [employer]IELD / Plan: [employer] [place] PPO / Product Type: *No Product type* /    Admitting Diagnosis: Anoxic BI  Admit Date/Time:  08/29/2012  5:30 PM Admission Comments: No comment available   Primary Diagnosis:  Anoxic encephalopathy Principal Problem: Anoxic encephalopathy  Patient Active Problem List   Diagnosis Date Noted  . Anoxic encephalopathy 08/30/2012  . ? 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/2012  . 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 Acute respiratory failure in setting of profound hypoglycemia 08/18/2012  . Severe diabetic hypoglycemia 08/18/2012  . DM (diabetes mellitus), type 2 with peripheral vascular complications 08/18/2012  . Acute encephalopathy 08/18/2012  . HTN (hypertension) 08/18/2012  . Noncompliance 08/18/2012    Expected Discharge Date: Expected Discharge Date: 09/13/12  Team Members Present: Physician leading conference: Dr. Faith Rogue Social Worker Present: Amada Jupiter, LCSW Nurse Present: Carmie End, RN PT Present: Other (comment) Sherrine Maples, PT) OT Present: Roney Mans, Loistine Chance, OT;Ardis Rowan, COTA Other (Discipline and Name): Tora Duck, PPS Coordinator     Current Status/Progress Goal Weekly Team Focus  Medical   improving memory and awareness. dm still difficult to control. wound issues. fitted with left AFO and diabetic shoe pending  see prior  dm mgt, skin care   Bowel/Bladder   continent of bowel and bladder  mod I  up to bathroom for all toileting with supervision   Swallow/Nutrition/ Hydration   n/a  n/a  n/a   ADL's   supervision  supervision  completed family education, d/c planning, planning for an  outting this week in the community   Mobility   supervision  supervision  family ed   Communication   Supervision  Supervision  increase clarity/meaning of when explaining thoughts, retelling events   Safety/Cognition/ Behavioral Observations  Min A  Supervision  incre emergent awareness of errors   Pain   no compaints of pain  pain goals 2      Skin   blister to third left toe/Dressing change to right foot wound qday/2x2/4x4/kerlix  no new skiin breakdown  educate family on wound care      *See Care Plan and progress notes for long and short-term goals.  Barriers to Discharge: see priro    Possible Resolutions to Barriers:  family ed, see prior    Discharge Planning/Teaching Needs:  home with wife and family to provide 24/7 supervision - wife moving them into new home tomorrow!        Team Discussion:  Making excellent progress and on track for d/c 6/19.  Family education completed today.  Wife to move into their new, ground floor apt. Tomorrow.   No concerns.  Revisions to Treatment Plan:  None   Continued Need for Acute Rehabilitation Level of Care: The patient requires daily medical management by a physician with specialized training in physical medicine and rehabilitation for the following conditions: Daily direction of a multidisciplinary physical rehabilitation program to ensure safe treatment while eliciting the highest outcome that is of practical value to the patient.: Yes Daily medical management of patient stability for increased activity during participation in an intensive rehabilitation regime.: Yes Daily analysis of laboratory  values and/or radiology reports with any subsequent need for medication adjustment of medical intervention for : Neurological problems;Other;Post surgical problems  Ailynn Gow 09/12/2012, 10:02 AM

## 2012-09-13 LAB — GLUCOSE, CAPILLARY
Glucose-Capillary: 79 mg/dL (ref 70–99)
Glucose-Capillary: 192 mg/dL — ABNORMAL HIGH (ref 70–99)

## 2012-09-13 MED ORDER — SACCHAROMYCES BOULARDII 250 MG PO CAPS: 250.0000 mg | ORAL_CAPSULE | Freq: Two times a day (BID) | ORAL | Status: DC

## 2012-09-13 MED ORDER — INSULIN DETEMIR 100 UNIT/ML ~~LOC~~ SOLN: 15.0000 [IU] | Freq: Every day | SUBCUTANEOUS | Status: DC

## 2012-09-13 MED ORDER — LIRAGLUTIDE 18 MG/3ML ~~LOC~~ SOPN: 1.2000 mg | PEN_INJECTOR | Freq: Every day | SUBCUTANEOUS | Status: DC

## 2012-09-13 MED ORDER — DSS 100 MG PO CAPS: 100.0000 mg | ORAL_CAPSULE | Freq: Two times a day (BID) | ORAL | Status: DC

## 2012-09-13 NOTE — Discharge Summary (Signed)
Physician Discharge Summary  Patient ID: DELAWRENCE FRIDMAN MRN: [id number] DOB/AGE: 55-Aug-1957 55 y.o.  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   Significant Diagnostic Studies: Dg Chest 2 View  08/24/2012   *RADIOLOGY REPORT*  Clinical Data: Fever, hypertension  CHEST - 2 VIEW  Comparison: Chest radiograph 08/20/2012  Findings: Normal mediastinum and cardiac silhouette.  Costophrenic angles are clear.  There are low lung volumes.  Mild central venous pulmonary congestion.  No focal consolidation. Small effusions noted.  IMPRESSION: Low lung volumes and central venous congestion.  Small effusions.   Original Report Authenticated By: Genevive Bi, M.D.   Labs:   BMET    Component Value Date/Time   NA 139 09/04/2012 0520   K 4.1 09/04/2012 0520   CL 99 09/04/2012 0520   CO2 33* 09/04/2012 0520   GLUCOSE 158* 09/04/2012 0520   BUN 15 09/04/2012 0520   CREATININE 0.90 09/12/2012 0605   CALCIUM 9.7 09/04/2012 0520   GFRNONAA >90 09/12/2012 0605   GFRAA >90 09/12/2012 0605   CBC    Component Value Date/Time   WBC 6.8 09/04/2012 0520   RBC 3.69* 09/04/2012 0520   HGB 11.7* 09/04/2012 0520   HCT 35.4* 09/04/2012 0520   PLT 368 09/04/2012 0520   MCV 95.9 09/04/2012 0520   MCH 31.7 09/04/2012 0520   MCHC 33.1 09/04/2012 0520   RDW 13.2 09/04/2012 0520   LYMPHSABS 1.4 08/30/2012 0535   MONOABS 0.9 08/30/2012 0535   EOSABS 0.1 08/30/2012 0535   BASOSABS 0.0 08/30/2012 0535    CBG:  Recent Labs Lab 09/12/12 1301 09/12/12 1655 09/12/12 2056 09/13/12 0629 09/13/12 1102  GLUCAP 218* 181* 79 70 192*    Brief HPI:   Thomas Frye is a 57 y.o. [name] with history of DM with diabetic foot ulcer,  OSA, depression with suicide attempt in the past; who was found in a parked care on 08/18/12. He was unresponsive with agonal respirations, posturing and profoundly hypoglycemic requiring intubation in field. CT of head with question of acute or chronic thalamic infarcts.  with question of seizure activity. He was started on IV antibiotics due to cellulitis and question of aspiration PNA.  Mentation slowly improving with decrease in lethargy and improvement in ability to follow basic commands and sustain attention to tasks. He was cleared by psychiatry to resume antidepressants as no SI noted reported. 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 Foley replaced as patient failed voiding trial. Therapy initiated and CIR recommended by team.    Hospital Course: TORRELL KRUTZ was admitted to rehab 08/29/2012 for inpatient therapies to consist of PT, ST and OT at least three hours five days a week. Past admission physiatrist, therapy team and rehab RN have worked together to provide customized collaborative inpatient rehab. Routine check of labs show resolution of leucocytosis. His diabetes was monitored with ac/hs cbg checks and levemir has been titrated for tighter control. Victoza was resumed on 06/13 and titrated to 1.2 mg at discharge. Evening dose of levemir was reduced and patient to discontinue pm dose if recurrent hypoglycemia occurs. His po intake has been good. Foley was discontinued and he is currently  voiding without retention. He was noted to have E. Coli UTI at admission and was treated with Cipro for this. Lisinopril was discontinued due to orthostatic symptoms and blood pressures have been reasonable controlled. Chronic right foot ulcer is stable without drainage.  Rehab course: During patient's stay in rehab weekly team conferences were held to monitor patient's progress, set goals and discuss barriers to  discharge. Speech therapy-addressing cognitive-linguistic goals. His prosody and intonation have improved since admission, however he has not reached baseline level at present. Emergent awareness has improved and he is able to identifies errors. He is currently at a supervision level for working memory and may need further intervention for prospective memory and utilizing aids. Pt.'s expression in conversation has significantly improved, however his semantics of longer and complex explanations may be intermittently decreased.   Occupational therapy has focused on neuromuscular reeducation of UE as well as ADL tasks and endurance.  He requires supervision for bathing and dressing tasks.  Physical therapy has focused on balance and mobility. He is showing improved postural control with increased strength, increased ability to compensate for deficits, improved attention and improved awareness. Patient to discharge at an ambulatory level Supervision. Wife has been supportive and has been educated on all aspects of safety needed with mobility, cognitive tasks as well as medication management. He is to continue to receive Outpatient PT and ST past discharge.    Disposition: 01-Home or Self Care  Diet: Diabetic diet.  Special Instructions: 1. Check blood sugars before meals and at bedtime. Record and discuss need for changes with primary care. 2. Dose of Victoza increased to 1.2mg  on 09/13/12. 3. NO DRIVING.         Future Appointments Provider Department Dept Phone   09/17/2012 9:30 AM Mena Pauls, PT [facility] [phone]   10/01/2012 8:00 AM Katherene Ponto [facility] [phone]   10/22/2012 11:00 AM Ranelle Oyster, MD [facility] [phone]       Medication List    STOP taking these medications       feeding supplement Liqd     insulin aspart  100 UNIT/ML injection  Commonly known as:  novoLOG     lisinopril 10 MG tablet  Commonly known as:  PRINIVIL,ZESTRIL      TAKE these medications       aspirin 81 MG EC tablet  Take 1 tablet (81 mg total) by mouth daily.     ciprofloxacin 500 MG tablet  Commonly known as:  CIPRO  Take 1 tablet (500 mg total) by mouth 2 (two) times daily.     doxycycline 100 MG tablet  Commonly known as:  VIBRA-TABS  Take 100 mg by mouth 2 (two) times daily.     DSS 100 MG Caps  Take 100 mg by mouth 2 (two) times daily.     escitalopram 10 MG tablet  Commonly known as:  LEXAPRO  Take 10 mg by mouth daily.     insulin detemir 100 UNIT/ML injection  Commonly known as:  LEVEMIR  Inject 0.15-0.45 mLs (15-45 Units total) into the skin daily. Take 45 units in am and 15 units with supper.     Liraglutide 18 MG/3ML Sopn  Inject 1.2 mg into the skin daily.     loratadine 10 MG tablet  Commonly known as:  CLARITIN  Take 10 mg by mouth daily.     omeprazole 40 MG capsule  Commonly known as:  PRILOSEC  Take 40 mg by mouth 2 (two) times daily.     pravastatin 40 MG tablet  Commonly known as:  PRAVACHOL  Take 40 mg by mouth daily.     saccharomyces boulardii 250 MG capsule  Commonly known as:  FLORASTOR  Take 1 capsule (250 mg total) by mouth 2 (two) times daily.     tamsulosin 0.4 MG Caps  Commonly known as:  FLOMAX  Take 1 capsule (0.4 mg total) by mouth daily after supper.       Follow-up Information   Follow up with Ranelle Oyster, MD On 10/22/2012. (Be there at  10:30 for 11 am appointment)    Contact information:   [address], [address] [place] [address] [phone]       Follow up with [facility]. Call today. (for follow up on brain injury)    Contact information:   [address] [place] [address] [phone]      Follow up with DAY,JAMES, MD On 09/20/2012. (8:15 am (please arrive at 8:00). Discuss adjustment of insulin and  need for resumption of  BP medications. )       Signed: Jacquelynn Cree 09/13/2012, 4:57 PM

## 2012-09-13 NOTE — Progress Notes (Signed)
Patient ID: Thomas Frye, male   DOB: Nov 07, 1955, 57 y.o.   MRN: [id number] Subjective/Complaints: No problems. Received diabetic shoes. A 12 point review of systems has been performed and if not noted above is otherwise negative.   Objective: Vital Signs: Blood pressure 155/80, pulse 89, temperature 97.9 F (36.6 C), temperature source Oral, resp. rate 19, height 6\' 4"  (1.93 m), weight 124.558 kg (274 lb 9.6 oz), SpO2 97.00%. No results found. No results found for this basename: WBC, HGB, HCT, PLT,  in the last 72 hours  Recent Labs  09/12/12 0605  CREATININE 0.90   CBG (last 3)   Recent Labs  09/12/12 1655 09/12/12 2056 09/13/12 0629  GLUCAP 181* 79 70    Wt Readings from Last 3 Encounters:  09/12/12 124.558 kg (274 lb 9.6 oz)  08/22/12 126.4 kg (278 lb 10.6 oz)  12/10/10 129.729 kg (286 lb)    Physical Exam:  Constitutional: He appears well-developed and well-nourished.  HENT:  Head: Normocephalic and atraumatic.  Right Ear: External ear normal.  Left Ear: External ear normal.  Eyes: Conjunctivae and EOM are normal. Pupils are equal, round, and reactive to light.  Neck: Normal range of motion. No JVD present. No tracheal deviation present. No thyromegaly present.  Cardiovascular: Normal rate and regular rhythm.  Pulmonary/Chest: Effort normal and breath sounds normal. No respiratory distress. He has no wheezes. He has no rales.  Abdominal: Soft. Bowel sounds are normal. He exhibits no distension. There is no tenderness.  Musculoskeletal: He exhibits no edema.    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 Right lateral foot with 2 cm stage II ulcer along 5th MT--clean and without change. Left toes with loss of toe nails.  Lymphadenopathy:  He has no cervical adenopathy.  Neurological: He is alert. He displays abnormal reflex. Coordination abnormal.  Flat affect but no signs of distress. Makes eye contact. Improved processing and speech.   Better insight as a whole. Recalls biographical information. Oriented to self and to rehab hospital. Remembered events from yesterday.      UES grossly 4/5. LE HF is 2-3/5 and is grossly 4/5 distally. RLE limited by wound/boot. Stocking glove sensory loss below the knees.  Skin: Skin is warm and dry.  Psychiatric: Judgment and thought content normal. His affect is improving. Thought processing quicker and more appropriate. Memory is better  Assessment/Plan: 1. 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 Physiatrist is providing close team supervision and 24 hour management of active medical problems listed below. Physiatrist and rehab team continue to assess barriers to discharge/monitor patient progress toward functional and medical goals.   Goals reached. Follow up with me in 4 weeks.  FIM: FIM - Bathing Bathing Steps Patient Completed: Chest;Right Arm;Left Arm;Right upper leg;Left upper leg;Abdomen;Buttocks;Front perineal area;Right lower leg (including foot);Left lower leg (including foot) Bathing: 5: Set-up assist to: Adjust water temp  FIM - Upper Body Dressing/Undressing Upper body dressing/undressing steps patient completed: Thread/unthread right sleeve of pullover shirt/dresss;Thread/unthread left sleeve of pullover shirt/dress;Put head through opening of pull over shirt/dress;Pull shirt over trunk Upper body dressing/undressing: 5: Set-up assist to: Obtain clothing/put away FIM - Lower Body Dressing/Undressing Lower body dressing/undressing steps patient completed: Thread/unthread right pants leg;Thread/unthread left pants leg;Pull pants up/down;Don/Doff left sock;Don/Doff left shoe;Fasten/unfasten pants;Fasten/unfasten left shoe Lower body dressing/undressing: 5: Set-up assist to: Obtain clothing  FIM - Toileting Toileting steps completed by patient: Performs  perineal  hygiene;Adjust clothing prior to toileting;Adjust clothing after toileting Toileting Assistive Devices: Grab bar or rail for support Toileting: 5: Supervision: Safety issues/verbal cues  FIM - Diplomatic Services operational officer Devices: Grab bars Toilet Transfers: 5-To toilet/BSC: Supervision (verbal cues/safety issues);5-From toilet/BSC: Supervision (verbal cues/safety issues)  FIM - Banker Devices: Therapist, occupational: 5: Chair or W/C > Bed: Supervision (verbal cues/safety issues)  FIM - Locomotion: Wheelchair Distance: 150 Locomotion: Wheelchair: 5: Travels 150 ft or more: maneuvers on rugs and over door sills with supervision, cueing or coaxing FIM - Locomotion: Ambulation Locomotion: Ambulation Assistive Devices: Designer, industrial/product Ambulation/Gait Assistance: 4: Min assist Locomotion: Ambulation: 5: Travels 150 ft or more with supervision/safety issues  Comprehension Comprehension Mode: Auditory Comprehension: 5-Understands complex 90% of the time/Cues < 10% of the time  Expression Expression Mode: Verbal Expression: 5-Expresses complex 90% of the time/cues < 10% of the time  Social Interaction Social Interaction: 6-Interacts appropriately with others with medication or extra time (anti-anxiety, antidepressant).  Problem Solving Problem Solving Mode: Asleep Problem Solving: 5-Solves complex 90% of the time/cues < 10% of the time  Memory Memory: 4-Recognizes or recalls 75 - 89% of the time/requires cueing 10 - 24% of the time  Medical Problem List and Plan:  1. DVT Prophylaxis/Anticoagulation: Pharmaceutical: Lovenox  2. Pain Management: N/A  3. Mood: No signs of distress noted and patient expressing need to get better.Will have LCSW and psychologist follow up for evaluation and support. Provide ego support. Continue lexapro  4. Neuropsych: This patient is not capable of making decisions on his own behalf.  5. DM  type 2 with peripheral neuropathy:  . Po intake improved. Monitor BS with AC./HS cbg checks. Continue to adjust levemir with novolog for meal coverage. Scheduled mealtime insulin----further adjustment as an outpt 6. Chronic neurogenic right foot ulcer: Pressure relief measures when in bed--CAM boot out of bed. Continue Cipro and doxycycline indefinitely due to chronic infection.  Wound with slow/minimal healing. outpt wound care follow up  - [employer] eva/fit for diabetic left shoe 7. Urinary retention:  Improving--off meds 8. Bowel incontinence:  Probiotic. Marland Kitchen 9. FEN: app much better  -RD follow up   -megace stopped 10. Hypotension--encourage fluids, stopped lisinopril---denies symptoms  -tolerate some elevation of bp for now  LOS (Days) 15 A FACE TO FACE EVALUATION WAS PERFORMED  SWARTZ,ZACHARY T 09/13/2012 7:58 AM

## 2012-09-13 NOTE — Plan of Care (Signed)
Problem: RH SKIN INTEGRITY Goal: RH STG MAINTAIN SKIN INTEGRITY WITH ASSISTANCE STG Maintain Skin Integrity With total assist Assistance from wife .  Outcome: Completed/Met Date Met:  09/13/12 Wife demonstrated dressingg change.

## 2012-09-13 NOTE — Progress Notes (Signed)
Physical Therapy Discharge Summary  Patient Details  Name: Thomas Frye MRN: [id number] Date of Birth: 1955-10-07  Today's Date: 09/13/2012  Patient has met 7 of 7 long term goals due to improved activity tolerance, improved balance, improved postural control, increased strength, ability to compensate for deficits, improved attention and improved awareness.  Patient to discharge at an ambulatory level Supervision.   Patient's care partner is independent to provide the necessary supervision assistance at discharge.  Reasons goals not met: n/a  Recommendation:  Patient will benefit from ongoing skilled PT services in outpatient setting to continue to advance safe functional mobility, address ongoing impairments in gait, balance, strengthening, and minimize fall risk.  Equipment: RW, W/C  Reasons for discharge: treatment goals met and discharge from hospital  Patient/family agrees with progress made and goals achieved: Yes  PT Discharge Cognition Overall Cognitive Status: Impaired/Different from baseline Arousal/Alertness: Awake/alert Orientation Level: Oriented X4 Attention: Selective Focused Attention: Appears intact Focused Attention Impairment: Verbal basic;Functional basic;Functional complex Sustained Attention: Appears intact Selective Attention: Appears intact Memory: Impaired Memory Impairment: Prospective memory Awareness: Impaired Awareness Impairment: Anticipatory impairment Problem Solving: Impaired Problem Solving Impairment: Functional complex Executive Function: Self Correcting Decision Making: Impaired Decision Making Impairment: Functional complex Initiating: Appears intact Self Correcting: Impaired Self Correcting Impairment: Functional complex;Verbal complex Safety/Judgment: Appears intact Sensation Sensation Light Touch Impaired Details: Impaired RLE;Impaired LLE Proprioception Impaired Details: Impaired RLE;Impaired LLE Motor  Motor Motor:  Within Functional Limits    Trunk/Postural Assessment  Cervical Assessment Cervical Assessment: Within Functional Limits Thoracic Assessment Thoracic Assessment: Within Functional Limits Lumbar Assessment Lumbar Assessment: Within Functional Limits Postural Control Righting Reactions: delayed Protective Responses: delayed  Balance Static Sitting Balance Static Sitting - Level of Assistance: 6: Modified independent (Device/Increase time) Dynamic Sitting Balance Dynamic Sitting - Level of Assistance: 6: Modified independent (Device/Increase time) Static Standing Balance Static Standing - Level of Assistance: 5: Stand by assistance Dynamic Standing Balance Dynamic Standing - Level of Assistance: 5: Stand by assistance Extremity Assessment      RLE Assessment RLE Assessment: Within Functional Limits LLE Assessment LLE Assessment:  (WFL except L ankle PF 1/5, DF 0/5)  See FIM for current functional status  Shilee Biggs 09/13/2012, 7:46 AM

## 2012-09-13 NOTE — Progress Notes (Signed)
Patient discharge to home with wife at 63.  Discharge instruction provided by Delle Reining, PA.  Wife demonstrated dressing change to patient's right foot.  Patient/wife verbalize understanding with no further questions ask.

## 2012-09-13 NOTE — Progress Notes (Signed)
Social Work  Discharge Note  The overall goal for the admission was met for:   Discharge location: Yes - home with wife and family to provide 24/7 supervision  Length of Stay: Yes - 15 days  Discharge activity level: Yes  Home/community participation: Yes  Services provided included: MD, RD, PT, OT, SLP, RN, TR, Pharmacy, Neuropsych and SW  Financial Services: Private Insurance: [employer] of [place]  Follow-up services arranged: Outpatient: PT, ST via [facility], DME: 20x18 Breezy w/c, cushion, tall rw, tub bench via [facility] and Patient/Family has no preference for HH/DME agencies  Comments (or additional information):  Patient/Family verbalized understanding of follow-up arrangements: Yes  Individual responsible for coordination of the follow-up plan: patient and wife  Confirmed correct DME delivered: Amada Jupiter 09/13/2012    Dore Oquin

## 2012-09-13 NOTE — Progress Notes (Signed)
Recreational Therapy Discharge Summary Patient Details  Name: Thomas Frye MRN: [id number] Date of Birth: 1955-12-15 Today's Date: 09/13/2012  Long term goals set: 1  Long term goals met: 1  Comments on progress toward goals: Pt met supervision level for community reintegration at ambulatory level.  Education provided on energy conservation, identification & negotiation of obstacles & safe mobility.  Pt stated understanding and demonstrated good safety during outing itself.  Pt discharging home today with 24 hour supervision.  Reasons for discharge: discharge from hospital  Patient/family agrees with progress made and goals achieved: Yes  Elizer Bostic 09/13/2012, 9:19 AM

## 2012-09-13 NOTE — Progress Notes (Signed)
Speech Language Pathology Discharge Summary  Patient Details  Name: Thomas Frye MRN: [id number] Date of Birth: 1955-09-25  Today's Date: 09/13/2012 Time:  -     Patient has met 5 of 5 long term goals.  Patient to discharge at overall Supervision level.  Reasons goals not met: n/a   Clinical Impression/Discharge Summary: Mr. Wedin exhibited significant improvements during rehab admission.  He met 5/5 of long term goals set.  His prosody and intonation have improved since admission, however he has not reached baseline level at present.  Emergent awareness improved and he identifies error and may benefit from continued therapy for anticipatory awareness of more complex situations.  He is currently at a supervision level for working memory and may need further intervention for prospective memory and utilizing aids.  Pt.'s expression in conversation has significantly improved, however his semantics of longer and complex explanations may be intermittently decreased.     Care Partner:  Caregiver Able to Provide Assistance: Yes     Recommendation:  Outpatient SLP  Rationale for SLP Follow Up: Maximize functional communication;Maximize cognitive function and independence   Equipment: none   Reasons for discharge: Discharged from hospital   Patient/Family Agrees with Progress Made and Goals Achieved: Yes   See FIM for current functional status  [name] 09/13/2012, 7:45 AM

## 2012-09-17 ENCOUNTER — Ambulatory Visit: Payer: BC Managed Care – PPO | Attending: Physical Medicine & Rehabilitation | Admitting: Physical Therapy

## 2012-09-17 DIAGNOSIS — R269 Unspecified abnormalities of gait and mobility: Secondary | ICD-10-CM | POA: Insufficient documentation

## 2012-09-17 DIAGNOSIS — R5381 Other malaise: Secondary | ICD-10-CM | POA: Insufficient documentation

## 2012-09-17 DIAGNOSIS — M6281 Muscle weakness (generalized): Secondary | ICD-10-CM | POA: Insufficient documentation

## 2012-09-17 DIAGNOSIS — IMO0001 Reserved for inherently not codable concepts without codable children: Secondary | ICD-10-CM | POA: Insufficient documentation

## 2012-09-20 ENCOUNTER — Ambulatory Visit: Payer: BC Managed Care – PPO | Admitting: Physical Therapy

## 2012-09-24 ENCOUNTER — Ambulatory Visit: Payer: BC Managed Care – PPO | Admitting: Physical Therapy

## 2012-09-25 ENCOUNTER — Ambulatory Visit: Payer: BC Managed Care – PPO | Admitting: Physical Therapy

## 2012-09-26 ENCOUNTER — Ambulatory Visit: Payer: BC Managed Care – PPO | Attending: Physical Medicine & Rehabilitation | Admitting: Physical Therapy

## 2012-09-26 DIAGNOSIS — IMO0001 Reserved for inherently not codable concepts without codable children: Secondary | ICD-10-CM | POA: Insufficient documentation

## 2012-09-26 DIAGNOSIS — M6281 Muscle weakness (generalized): Secondary | ICD-10-CM | POA: Insufficient documentation

## 2012-09-26 DIAGNOSIS — R5381 Other malaise: Secondary | ICD-10-CM | POA: Insufficient documentation

## 2012-09-26 DIAGNOSIS — R269 Unspecified abnormalities of gait and mobility: Secondary | ICD-10-CM | POA: Insufficient documentation

## 2012-10-01 ENCOUNTER — Ambulatory Visit: Payer: BC Managed Care – PPO | Admitting: Physical Therapy

## 2012-10-01 ENCOUNTER — Ambulatory Visit: Payer: BC Managed Care – PPO | Admitting: Speech Pathology

## 2012-10-03 ENCOUNTER — Ambulatory Visit: Payer: BC Managed Care – PPO | Admitting: Physical Therapy

## 2012-10-04 ENCOUNTER — Ambulatory Visit: Payer: BC Managed Care – PPO | Admitting: Physical Therapy

## 2012-10-05 ENCOUNTER — Encounter: Payer: Self-pay | Admitting: Neurology

## 2012-10-05 ENCOUNTER — Ambulatory Visit (INDEPENDENT_AMBULATORY_CARE_PROVIDER_SITE_OTHER): Payer: BC Managed Care – PPO | Admitting: Neurology

## 2012-10-05 VITALS — BP 117/71 | HR 95 | Ht 75.25 in | Wt 254.0 lb

## 2012-10-05 DIAGNOSIS — M21372 Foot drop, left foot: Secondary | ICD-10-CM

## 2012-10-05 DIAGNOSIS — E1142 Type 2 diabetes mellitus with diabetic polyneuropathy: Secondary | ICD-10-CM

## 2012-10-05 DIAGNOSIS — E1169 Type 2 diabetes mellitus with other specified complication: Secondary | ICD-10-CM

## 2012-10-05 DIAGNOSIS — E11649 Type 2 diabetes mellitus with hypoglycemia without coma: Secondary | ICD-10-CM

## 2012-10-05 DIAGNOSIS — M216X9 Other acquired deformities of unspecified foot: Secondary | ICD-10-CM

## 2012-10-05 HISTORY — DX: Type 2 diabetes mellitus with diabetic polyneuropathy: E11.42

## 2012-10-05 HISTORY — DX: Foot drop, left foot: M21.372

## 2012-10-05 NOTE — Progress Notes (Signed)
Reason for visit: Hypoglycemic encephalopathy  Thomas Frye is a 57 y.o. male  History of present illness:  Mr. Thomas Frye is a 57 year old left-handed white male with a history of diabetes. The patient was admitted to the hospital around 08/18/2012 with an episode of severe hypoglycemia that came on the day before while he was sitting in a car. The patient was found in the car, unconscious. The patient required a prolonged hospitalization and subsequent inpatient rehabilitation. The patient underwent a CT scan of the brain, as he cannot have MRI evaluation secondary to metal in the body. The CT showed evidence of a right thalamic stroke and a left parietal white matter stroke of indeterminate age. The patient underwent an EEG study as there was a questionable history of seizures upon admission. The EEG study was normal. The patient also underwent a 2-D echocardiogram that was relatively unremarkable. The patient was found to have an ejection fraction of 55-60%. No evidence of an embolic source of stroke was found. Carotid Doppler studies did not show critical stenosis but there was soft plaque within the common carotid arteries bilaterally. The patient developed a left footdrop following the hypoglycemic event. The patient is wearing an AFO brace, and he is getting outpatient rehabilitation. The patient has regained a significant degree of his cognitive processing abilities, almost at baseline. The patient is planning on returning to work around 10/15/2012. The patient will go back part-time. The patient is sent to this office for an evaluation. The patient denies any back pain or pain radiating down the leg. The patient has a pre-existing history of a diabetic peripheral neuropathy.  Past Medical History  Diagnosis Date  . Diabetes mellitus   . Hypertension   . Depression   . GERD (gastroesophageal reflux disease)   . H/O hiatal hernia   . Neuromuscular disorder     neuropathy  . Sleep  apnea     wears cpap at night  . 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.2) 10/05/2012  . Left foot drop 10/05/2012  . BPH (benign prostatic hyperplasia)   . Dyslipidemia   . Gait disturbance   . Cerebrovascular disease     Right thalamic, left parietal white matter strokes    Past Surgical History  Procedure Laterality Date  . Toe amputation  2008  . Eye surgery    . No past surgeries    . Fracture surgery      Questionable steel screws to right tibia    Family History  Problem Relation Age of Onset  . Breast cancer Mother   . Melanoma Mother   . Diabetes Mother   . Diabetes Brother   . Diabetes Maternal Grandmother     Social history:  reports that he has never smoked. He does not have any smokeless tobacco history on file. He reports that he drinks about 0.6 ounces of alcohol per week. He reports that he does not use illicit drugs.  Medications:  Current Outpatient Prescriptions on File Prior to Visit  Medication Sig Dispense Refill  . aspirin EC 81 MG EC tablet Take 1 tablet (81 mg total) by mouth daily.      . ciprofloxacin (CIPRO) 500 MG tablet Take 1 tablet (500 mg total) by mouth 2 (two) times daily.      Marland Kitchen docusate sodium 100 MG CAPS Take 100 mg by mouth 2 (two) times daily.  10 capsule  0  . doxycycline (VIBRA-TABS) 100 MG tablet Take 100 mg by mouth 2 (two) times daily.      Marland Kitchen  escitalopram (LEXAPRO) 10 MG tablet Take 10 mg by mouth daily.      . insulin detemir (LEVEMIR) 100 UNIT/ML injection Inject 0.15-0.45 mLs (15-45 Units total) into the skin daily. Take 45 units in am and 15 units with supper.  10 mL  12  . Liraglutide 18 MG/3ML SOPN Inject 1.2 mg into the skin daily.      Marland Kitchen loratadine (CLARITIN) 10 MG tablet Take 10 mg by mouth daily.      Marland Kitchen omeprazole (PRILOSEC) 40 MG capsule Take 40 mg by mouth 2 (two) times daily.      . pravastatin (PRAVACHOL) 40 MG tablet Take 40 mg by mouth daily.      Marland Kitchen saccharomyces boulardii (FLORASTOR) 250 MG capsule Take 1 capsule (250 mg  total) by mouth 2 (two) times daily.  60 capsule  1  . tamsulosin (FLOMAX) 0.4 MG CAPS Take 1 capsule (0.4 mg total) by mouth daily after supper.  30 capsule     No current facility-administered medications on file prior to visit.    Allergies:  Allergies  Allergen Reactions  . Latex Swelling  . Sulfa Antibiotics Hives    ROS:  Out of a complete 14 system review of symptoms, the patient complains only of the following symptoms, and all other reviewed systems are negative.  Gait disturbance Numbness, weakness, footdrop Memory loss  Blood pressure 117/71, pulse 95, height 6' 3.25" (1.911 m), weight 254 lb (115.214 kg).  Physical Exam  General: The patient is alert and cooperative at the time of the examination. The patient is minimally obese.  Head: Pupils are equal, round, and reactive to light. Discs are flat bilaterally.  Neck: The neck is supple, no carotid bruits are noted.  Respiratory: The respiratory examination is clear.  Cardiovascular: The cardiovascular examination reveals a regular rate and rhythm, no obvious murmurs or rubs are noted.  Skin: Extremities are without significant edema. The patient has amputation of the right great toe. The patient wears an AFO brace on the left.  Neurologic Exam  Mental status:  Cranial nerves: Facial symmetry is present. There is good sensation of the face to pinprick and soft touch bilaterally. The strength of the facial muscles and the muscles to head turning and shoulder shrug are normal bilaterally. Speech is well enunciated, no aphasia or dysarthria is noted. Extraocular movements are full. Visual fields are full.  Motor: The motor testing reveals 5 over 5 strength of all 4 extremities, with the exception that there is a mild right footdrop, and a significant left footdrop with severe inversion and eversion weakness of the left foot as well. The patient does have the ability to prophylaxis the left foot. Good symmetric motor  tone is noted throughout.  Sensory: Sensory testing is intact to pinprick, soft touch, vibration sensation, and position sense on the upper extremities. With the lower extremities, there is a pinprick stocking deficit two thirds the way up the legs below the knees. Vibration sensation is intact in both feet and hands, but position sensation is severely impaired in both feet. No evidence of extinction is noted.  Coordination: Cerebellar testing reveals good finger-nose-finger and heel-to-shin bilaterally.  Gait and station: Gait is wide-based, steppage gait with the left leg. The patient uses a walker for ambulation. Tandem gait was not attempted. Romberg is positive, the patient falls backwards. No drift is seen.  Reflexes: Deep tendon reflexes are symmetric, but are depressed bilaterally. Toes are downgoing bilaterally.   Assessment/Plan:  1. Hypoglycemic encephalopathy,  improved  2. Gait disorder  3. Diabetic peripheral neuropathy  4. Left footdrop  5. Cerebrovascular disease, right thalamic stroke, left parietal white matter stroke  The patient is doing much better with his cognitive abilities at this time. The patient will be set up for nerve conduction studies of both legs, and one arm. The patient will have EMG evaluation of the left leg. The patient may have difficulty driving a car, as he has significant impairment of sensation of both legs. The patient will followup through this office for the EMG study.  Marlan Palau MD 10/07/2012 7:27 PM  [facility] [address] [place], [place] [address]  Phone [phone] Fax [phone]

## 2012-10-10 ENCOUNTER — Ambulatory Visit: Payer: BC Managed Care – PPO

## 2012-10-10 ENCOUNTER — Ambulatory Visit: Payer: BC Managed Care – PPO | Admitting: Physical Therapy

## 2012-10-12 ENCOUNTER — Ambulatory Visit: Payer: BC Managed Care – PPO | Admitting: Physical Therapy

## 2012-10-12 ENCOUNTER — Ambulatory Visit: Payer: BC Managed Care – PPO

## 2012-10-15 ENCOUNTER — Ambulatory Visit: Payer: BC Managed Care – PPO

## 2012-10-15 ENCOUNTER — Ambulatory Visit: Payer: BC Managed Care – PPO | Admitting: Physical Therapy

## 2012-10-17 ENCOUNTER — Ambulatory Visit: Payer: BC Managed Care – PPO | Admitting: Speech Pathology

## 2012-10-17 ENCOUNTER — Ambulatory Visit: Payer: BC Managed Care – PPO | Admitting: Physical Therapy

## 2012-10-19 ENCOUNTER — Inpatient Hospital Stay: Payer: BC Managed Care – PPO | Admitting: Physical Medicine & Rehabilitation

## 2012-10-22 ENCOUNTER — Ambulatory Visit: Payer: BC Managed Care – PPO | Admitting: Physical Therapy

## 2012-10-22 ENCOUNTER — Encounter
Payer: BC Managed Care – PPO | Attending: Physical Medicine & Rehabilitation | Admitting: Physical Medicine & Rehabilitation

## 2012-10-22 ENCOUNTER — Ambulatory Visit: Payer: BC Managed Care – PPO | Admitting: Speech Pathology

## 2012-10-22 ENCOUNTER — Encounter: Payer: Self-pay | Admitting: Physical Medicine & Rehabilitation

## 2012-10-22 VITALS — BP 89/51 | HR 102 | Resp 16 | Ht 75.0 in | Wt 264.0 lb

## 2012-10-22 DIAGNOSIS — M21372 Foot drop, left foot: Secondary | ICD-10-CM

## 2012-10-22 DIAGNOSIS — E1149 Type 2 diabetes mellitus with other diabetic neurological complication: Secondary | ICD-10-CM | POA: Insufficient documentation

## 2012-10-22 DIAGNOSIS — X58XXXA Exposure to other specified factors, initial encounter: Secondary | ICD-10-CM | POA: Insufficient documentation

## 2012-10-22 DIAGNOSIS — G931 Anoxic brain damage, not elsewhere classified: Secondary | ICD-10-CM | POA: Insufficient documentation

## 2012-10-22 DIAGNOSIS — E1142 Type 2 diabetes mellitus with diabetic polyneuropathy: Secondary | ICD-10-CM

## 2012-10-22 DIAGNOSIS — M216X9 Other acquired deformities of unspecified foot: Secondary | ICD-10-CM

## 2012-10-22 NOTE — Progress Notes (Signed)
Subjective:    Patient ID: Thomas Frye, male    DOB: 12/28/1955, 57 y.o.   MRN: [id number]  HPI  Thomas Frye is back regarding his anoxic BI. He was with [place] on rehab for a few weeks last month.   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 He is going to office and interacting with customers and working on the computer as well. He has a hard time keeping up with passwords and sometimes with math (usually outside work with therapy.) he hasn't had any issues as far as the responsibilities of his job is concerned. His wife is driving him to the office.   He is still using his walker per therapy direction. He wears his AFO always when he walks.   Pain Inventory Average Pain 0 Pain Right Now 0 My pain is na  In the last 24 hours, has pain interfered with the following? General activity 0 Relation with others 0 Enjoyment of life 0 What TIME of day is your pain at its worst? na Sleep (in general) Good  Pain is worse with: na Pain improves with: na Relief from Meds: na  Mobility use a walker how many minutes can you walk? 15  Function employed # of hrs/week na  Neuro/Psych No problems in this area  Prior Studies Any changes since last visit?  no  Physicians involved in your care Any changes since last visit?  no   Family History  Problem Relation Age of Onset  . Breast cancer Mother   . Melanoma Mother   . Diabetes Mother   . Diabetes Brother   . Diabetes Maternal Grandmother    History   Social History  . Marital Status: Married    Spouse Name: N/A    Number of Children: 4  . Years of Education: 16   Occupational History  . SALES    Social History Main Topics  . Smoking status: Never Smoker   . Smokeless tobacco: None  . Alcohol Use: 0.6 oz/week    1 Glasses of wine per week     Comment: Rare occasions  . Drug Use: No  . Sexually Active: Yes    Birth Control/ Protection: Surgical   Other Topics Concern   . None   Social History Narrative  . None   Past Surgical History  Procedure Laterality Date  . Toe amputation  2008  . Eye surgery    . No past surgeries    . Fracture surgery      Questionable steel screws to right tibia   Past Medical History  Diagnosis Date  . Diabetes mellitus   . Hypertension   . Depression   . GERD (gastroesophageal reflux disease)   . H/O hiatal hernia   . Neuromuscular disorder     neuropathy  . Sleep apnea     wears cpap at night  . 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.2) 10/05/2012  . Left foot drop 10/05/2012  . BPH (benign prostatic hyperplasia)   . Dyslipidemia   . Gait disturbance   . Cerebrovascular disease     Right thalamic, left parietal white matter strokes   BP 89/51  Pulse 102  Resp 16  Ht 6\' 3"  (1.905 m)  Wt 264 lb (119.75 kg)  BMI 33 kg/m2  SpO2 99%     Review of Systems  Respiratory: Positive for apnea.   All other systems reviewed and are negative.       Objective:  Physical Exam  General: Alert and oriented x 3, No apparent distress HEENT: Head is normocephalic, atraumatic, PERRLA, EOMI, sclera anicteric, oral mucosa pink and moist, dentition intact, ext ear canals clear,  Neck: Supple without JVD or lymphadenopathy Heart: Reg rate and rhythm. No murmurs rubs or gallops Chest: CTA bilaterally without wheezes, rales, or rhonchi; no distress Abdomen: Soft, non-tender, non-distended, bowel sounds positive. Extremities: No clubbing, cyanosis, or edema. Pulses are 2+ Skin: Clean. Callus on right foot essentially healed. He has an area on the left first toe which is bloody. Nail is gone on the toe. Neuro: he still has some delay in processing but thought content is fairly intact. He has good insight and awareness. He remembered 2/3 words after 5 minutes. Sequencing was appropriate. Gait was intact with the walker. He was slightly wide based without the walker. He appeared at no risk for falling.  Cranial nerves 2-12 are  intact. Diminished PP/LT below the upper calf bilateral Lower ext.. Reflexes are 1+ in all 4's. Fine motor coordination is intact. No tremors. Motor function  Musculoskeletal: Full ROM, No pain with AROM or PROM in the neck, trunk, or extremities. Posture appropriate Psych: Pt's affect is appropriate. Pt is cooperative         Assessment & Plan:  1. Anoxic encephalopathy 2. Diabetes type 2 with peripheral neuropathy 3. 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   Plan:  1. I think the walker remains appropriate. He probably can advance to a cane but given his ongoing left foot drop and significant diabetic polyneuropathy, walking without a device presents fairly significant risk. 2. NCS/EMG per neuro. I suspect he has a distal sciatic nerve injury (peroneal div preference), likely due to prolonged compression of his popliteal area while seated in his car, unconscious. Recommended ongoing muscle strengthening and gait exercises as well as aggressive diabetes mgt 3. Gave the patient permission to drive after graduation from parking lot to live trials with the supervision of his wife. Once driving in traffic alone, he may only drive an automatic vehicle and during the day time for no more than 30 minutes in one direction.  4. At this point, he may follow up with me PRN. 30 minutes of face to face patient care time were spent during this visit. All questions were encouraged and answered.

## 2012-10-22 NOTE — Patient Instructions (Signed)
Graduated return to driving instructions were provided. It is recommended that the patient first drives with another licensed driver in an empty parking lot. If the patient does well with this, and they can drive on a quiet street with the licensed driver. If the patient does well with this they can drive on a busy street with a licensed driver. If the patient does well with this after three trials, the next time out they can go by himself. Keep driving to daytime only and local.   CALL ME WITH ANY PROBLEMS OR QUESTIONS (#[phone]).  HAVE A GOOD DAY

## 2012-10-24 ENCOUNTER — Ambulatory Visit (INDEPENDENT_AMBULATORY_CARE_PROVIDER_SITE_OTHER): Payer: BC Managed Care – PPO | Admitting: Neurology

## 2012-10-24 ENCOUNTER — Encounter: Payer: Self-pay | Admitting: Neurology

## 2012-10-24 ENCOUNTER — Encounter (INDEPENDENT_AMBULATORY_CARE_PROVIDER_SITE_OTHER): Payer: BC Managed Care – PPO

## 2012-10-24 ENCOUNTER — Ambulatory Visit: Payer: BC Managed Care – PPO | Admitting: Physical Therapy

## 2012-10-24 ENCOUNTER — Encounter: Payer: BC Managed Care – PPO | Admitting: Speech Pathology

## 2012-10-24 DIAGNOSIS — G5702 Lesion of sciatic nerve, left lower limb: Secondary | ICD-10-CM

## 2012-10-24 DIAGNOSIS — E1142 Type 2 diabetes mellitus with diabetic polyneuropathy: Secondary | ICD-10-CM

## 2012-10-24 DIAGNOSIS — G5601 Carpal tunnel syndrome, right upper limb: Secondary | ICD-10-CM

## 2012-10-24 DIAGNOSIS — G5700 Lesion of sciatic nerve, unspecified lower limb: Secondary | ICD-10-CM

## 2012-10-24 DIAGNOSIS — Z0289 Encounter for other administrative examinations: Secondary | ICD-10-CM

## 2012-10-24 DIAGNOSIS — E11649 Type 2 diabetes mellitus with hypoglycemia without coma: Secondary | ICD-10-CM

## 2012-10-24 DIAGNOSIS — M21372 Foot drop, left foot: Secondary | ICD-10-CM

## 2012-10-24 NOTE — Progress Notes (Signed)
Thomas Frye returns today for EMG and nerve conduction study evaluation. These studies show evidence of a severe diabetic peripheral neuropathy, and a mild right carpal tunnel syndrome. EMG evaluation of the left leg shows evidence consistent with the peripheral neuropathy, with evidence as well for an overlying left sciatic neuropathy. This sciatic neuropathies likely associated with a compressive lesion associated with the episode of the severe diabetic hypoglycemic event, and, compression of the left sciatic nerve while sitting in the car for 8-12 hours.  I discussed the results of the EMG nerve conduction study with the patient. I have indicated that the left footdrop is likely to be permanent. The patient also has a right footdrop, but this is more mild. The weakness in the hamstring muscles likely will improve over time, but this may take up to a year. Hopefully, his walking problems will improve some as the hamstring muscles get better. The patient will followup through this office if needed.

## 2012-10-24 NOTE — Procedures (Signed)
  HISTORY:  Thomas Frye is a 57 year old gentleman with a history of diabetes, and a diabetic peripheral neuropathy. On 08/18/2012, the patient had a severe hypoglycemic event, and he lost consciousness while sitting in the car for 8 to 12 hours. The patient has developed a left sided footdrop following this event. The patient is being evaluated for the left sided footdrop.  NERVE CONDUCTION STUDIES:  Nerve conduction studies were performed on both lower extremities and on the right upper extremity. The distal motor latency for the right median nerve was prolonged, with a borderline distal motor latency for the right ulnar nerve. The motor amplitudes for the right median and ulnar nerves were normal. The F wave latency for the right median nerve was borderline normal, normal for the right ulnar nerve. The nerve conduction velocities for the right median and ulnar nerves were normal, with prolongation of the right median sensory latency. The right ulnar sensory latency was normal.  Nerve conduction studies of both lower extremities involving the peroneal and posterior tibial nerves were unobtainable bilaterally. The H reflex latencies were unobtainable bilaterally, and the peroneal sensory latencies were unobtainable bilaterally.  EMG STUDIES:  EMG study was performed on the left lower extremity:  The tibialis anterior muscle reveals no voluntary motor units with no recruitment. 2+ fibrillations and positive waves were seen. The peroneus tertius muscle reveals no voluntary motor units with no recruitment. 2+ fibrillations and positive waves were seen. Insertional activity is decreased. The medial gastrocnemius muscle reveals 1 to 6 K motor units with decreased recruitment. 2+ fibrillations and positive waves were seen. The vastus lateralis muscle reveals 2 to 4K motor units with full recruitment. No fibrillations or positive waves were seen. The iliopsoas muscle reveals 2 to 4K motor units with  full recruitment. No fibrillations or positive waves were seen. The biceps femoris muscle (long head) reveals 1 to 3K motor units with moderately decreased recruitment. 3+ fibrillations and positive waves were seen. The lumbosacral paraspinal muscles were tested at 3 levels, and revealed no abnormalities of insertional activity at all 3 levels tested. There was good relaxation.   IMPRESSION:  Nerve conduction studies done on both lower extremities and on the right upper extremity shows evidence of a mild right carpal tunnel syndrome. There appears to be evidence of a severe axonal peripheral neuropathy, likely associated with diabetes. EMG evaluation of the left lower extremity shows distal acute and chronic denervation consistent with the history of a peripheral neuropathy. There is also evidence of an overlying sciatic neuropathy without evidence of a lumbosacral radiculopathy. The sciatic neuropathy is likely compressive, associated with the prolonged period of unconsciousness while in a sitting position. Prognosis for recovery of the left footdrop is poor.  Marlan Palau MD 10/24/2012 11:10 AM  [facility] [address] [place], [place] [address]  Phone [phone] Fax [phone]

## 2012-10-25 ENCOUNTER — Ambulatory Visit: Payer: BC Managed Care – PPO | Admitting: Physical Therapy

## 2012-10-29 ENCOUNTER — Ambulatory Visit: Payer: BC Managed Care – PPO | Attending: Physical Medicine & Rehabilitation

## 2012-10-29 DIAGNOSIS — R5381 Other malaise: Secondary | ICD-10-CM | POA: Insufficient documentation

## 2012-10-29 DIAGNOSIS — IMO0001 Reserved for inherently not codable concepts without codable children: Secondary | ICD-10-CM | POA: Insufficient documentation

## 2012-10-29 DIAGNOSIS — R269 Unspecified abnormalities of gait and mobility: Secondary | ICD-10-CM | POA: Insufficient documentation

## 2012-10-29 DIAGNOSIS — M6281 Muscle weakness (generalized): Secondary | ICD-10-CM | POA: Insufficient documentation

## 2012-11-02 ENCOUNTER — Ambulatory Visit: Payer: BC Managed Care – PPO

## 2012-11-05 ENCOUNTER — Ambulatory Visit: Payer: BC Managed Care – PPO

## 2012-11-07 ENCOUNTER — Ambulatory Visit: Payer: BC Managed Care – PPO

## 2012-11-12 ENCOUNTER — Ambulatory Visit: Payer: BC Managed Care – PPO | Admitting: Speech Pathology

## 2012-11-12 ENCOUNTER — Ambulatory Visit: Payer: BC Managed Care – PPO | Admitting: Physical Therapy

## 2012-11-14 ENCOUNTER — Ambulatory Visit: Payer: BC Managed Care – PPO | Admitting: Physical Therapy

## 2012-11-14 ENCOUNTER — Ambulatory Visit: Payer: BC Managed Care – PPO | Admitting: Speech Pathology

## 2012-11-20 ENCOUNTER — Ambulatory Visit: Payer: BC Managed Care – PPO | Admitting: Physical Therapy

## 2012-11-22 ENCOUNTER — Ambulatory Visit: Payer: BC Managed Care – PPO | Admitting: Physical Therapy

## 2012-11-22 ENCOUNTER — Ambulatory Visit: Payer: BC Managed Care – PPO

## 2012-11-28 ENCOUNTER — Ambulatory Visit: Payer: BC Managed Care – PPO | Admitting: Speech Pathology

## 2012-11-28 ENCOUNTER — Ambulatory Visit: Payer: BC Managed Care – PPO | Admitting: Physical Therapy

## 2012-11-30 ENCOUNTER — Ambulatory Visit: Payer: BC Managed Care – PPO | Attending: Physical Medicine & Rehabilitation

## 2012-11-30 ENCOUNTER — Ambulatory Visit: Payer: BC Managed Care – PPO

## 2012-11-30 DIAGNOSIS — IMO0001 Reserved for inherently not codable concepts without codable children: Secondary | ICD-10-CM | POA: Insufficient documentation

## 2012-11-30 DIAGNOSIS — M6281 Muscle weakness (generalized): Secondary | ICD-10-CM | POA: Insufficient documentation

## 2012-11-30 DIAGNOSIS — R269 Unspecified abnormalities of gait and mobility: Secondary | ICD-10-CM | POA: Insufficient documentation

## 2012-11-30 DIAGNOSIS — R5381 Other malaise: Secondary | ICD-10-CM | POA: Insufficient documentation

## 2012-12-03 ENCOUNTER — Ambulatory Visit: Payer: BC Managed Care – PPO | Admitting: Physical Therapy

## 2012-12-03 ENCOUNTER — Ambulatory Visit: Payer: BC Managed Care – PPO

## 2012-12-05 ENCOUNTER — Ambulatory Visit: Payer: BC Managed Care – PPO | Admitting: Speech Pathology

## 2012-12-05 ENCOUNTER — Ambulatory Visit: Payer: BC Managed Care – PPO | Admitting: Physical Therapy

## 2012-12-09 ENCOUNTER — Encounter (HOSPITAL_COMMUNITY): Payer: Self-pay | Admitting: *Deleted

## 2012-12-09 ENCOUNTER — Emergency Department (HOSPITAL_COMMUNITY)
Admission: EM | Admit: 2012-12-09 | Discharge: 2012-12-09 | Disposition: A | Discharge status: Home / Self Care | Payer: BC Managed Care – PPO | Attending: Emergency Medicine | Admitting: Emergency Medicine

## 2012-12-09 ENCOUNTER — Emergency Department (HOSPITAL_COMMUNITY): Payer: BC Managed Care – PPO

## 2012-12-09 DIAGNOSIS — I10 Essential (primary) hypertension: Secondary | ICD-10-CM | POA: Insufficient documentation

## 2012-12-09 DIAGNOSIS — Z87448 Personal history of other diseases of urinary system: Secondary | ICD-10-CM | POA: Insufficient documentation

## 2012-12-09 DIAGNOSIS — Z79899 Other long term (current) drug therapy: Secondary | ICD-10-CM | POA: Insufficient documentation

## 2012-12-09 DIAGNOSIS — R630 Anorexia: Secondary | ICD-10-CM | POA: Insufficient documentation

## 2012-12-09 DIAGNOSIS — Z7982 Long term (current) use of aspirin: Secondary | ICD-10-CM | POA: Insufficient documentation

## 2012-12-09 DIAGNOSIS — E1142 Type 2 diabetes mellitus with diabetic polyneuropathy: Secondary | ICD-10-CM | POA: Insufficient documentation

## 2012-12-09 DIAGNOSIS — J069 Acute upper respiratory infection, unspecified: Secondary | ICD-10-CM | POA: Insufficient documentation

## 2012-12-09 DIAGNOSIS — F329 Major depressive disorder, single episode, unspecified: Secondary | ICD-10-CM | POA: Insufficient documentation

## 2012-12-09 DIAGNOSIS — Z794 Long term (current) use of insulin: Secondary | ICD-10-CM | POA: Insufficient documentation

## 2012-12-09 DIAGNOSIS — R5381 Other malaise: Secondary | ICD-10-CM | POA: Insufficient documentation

## 2012-12-09 DIAGNOSIS — R000 Tachycardia, unspecified: Secondary | ICD-10-CM | POA: Insufficient documentation

## 2012-12-09 DIAGNOSIS — K219 Gastro-esophageal reflux disease without esophagitis: Secondary | ICD-10-CM | POA: Insufficient documentation

## 2012-12-09 DIAGNOSIS — F3289 Other specified depressive episodes: Secondary | ICD-10-CM | POA: Insufficient documentation

## 2012-12-09 DIAGNOSIS — E1149 Type 2 diabetes mellitus with other diabetic neurological complication: Secondary | ICD-10-CM | POA: Insufficient documentation

## 2012-12-09 DIAGNOSIS — E785 Hyperlipidemia, unspecified: Secondary | ICD-10-CM | POA: Insufficient documentation

## 2012-12-09 LAB — CBC WITH DIFFERENTIAL/PLATELET
Eosinophils Absolute: 0 10*3/uL (ref 0.0–0.7)
Eosinophils Relative: 0 % (ref 0–5)
Hemoglobin: 14 g/dL (ref 13.0–17.0)
Lymphs Abs: 1.1 10*3/uL (ref 0.7–4.0)
MCH: 32.4 pg (ref 26.0–34.0)
MCV: 94 fL (ref 78.0–100.0)
Monocytes Absolute: 1.1 10*3/uL — ABNORMAL HIGH (ref 0.1–1.0)
Monocytes Relative: 6 % (ref 3–12)
Platelets: 228 10*3/uL (ref 150–400)
RBC: 4.32 MIL/uL (ref 4.22–5.81)

## 2012-12-09 LAB — COMPREHENSIVE METABOLIC PANEL
AST: 15 U/L (ref 0–37)
Albumin: 3.8 g/dL (ref 3.5–5.2)
Chloride: 96 mEq/L (ref 96–112)
Creatinine, Ser: 1.22 mg/dL (ref 0.50–1.35)
Total Bilirubin: 0.4 mg/dL (ref 0.3–1.2)

## 2012-12-09 LAB — URINALYSIS, ROUTINE W REFLEX MICROSCOPIC
Glucose, UA: >1000 mg/dL — AB
Leukocytes, UA: NEGATIVE
pH: 5 (ref 5.0–8.0)

## 2012-12-09 LAB — POCT I-STAT 3, VENOUS BLOOD GAS (G3P V)
O2 Saturation: 62 %
pCO2, Ven: 43.8 mmHg — ABNORMAL LOW (ref 45.0–50.0)
pH, Ven: 7.359 — ABNORMAL HIGH (ref 7.250–7.300)

## 2012-12-09 LAB — URINE MICROSCOPIC-ADD ON

## 2012-12-09 LAB — GLUCOSE, CAPILLARY: Glucose-Capillary: 287 mg/dL — ABNORMAL HIGH (ref 70–99)

## 2012-12-09 MED ORDER — SODIUM CHLORIDE 0.9 % IV BOLUS (SEPSIS)
1000.0000 mL | Freq: Once | INTRAVENOUS | Status: AC
  Administered 2012-12-09: 1000 mL via INTRAVENOUS

## 2012-12-09 MED ORDER — LEVOFLOXACIN 500 MG PO TABS: 500.0000 mg | ORAL_TABLET | Freq: Every day | ORAL | Status: DC

## 2012-12-09 MED ORDER — LEVOFLOXACIN 500 MG PO TABS
500.0000 mg | ORAL_TABLET | Freq: Once | ORAL | Status: AC
  Administered 2012-12-09: 500 mg via ORAL
  Filled 2012-12-09: qty 1

## 2012-12-09 NOTE — ED Provider Notes (Signed)
CSN: [id number]     Arrival date & time 12/09/12  1437 History   First MD Initiated Contact with Patient 12/09/12 1546     Chief Complaint  Patient presents with  . Hypoglycemia   (Consider location/radiation/quality/duration/timing/severity/associated sxs/prior Treatment) HPI Patient presents with concern of generalized fatigue, anorexia, labile blood sugar. Symptoms began 2 days ago without clear precipitant. Patient says that he has been compliant with all medications, and has been doing generally well since discharge from her rehabilitation facility. He was in a rehabilitation due to anoxic brain injury Over the past days he has felt generally unwell.  Within the past 12 hours his blood sugar has been as low as 15, and as high as 300. After being found unresponsive by his wife approximately 12 hours ago, he had EMS provide services.  Patient refused transport to the hospital at that point, after his glucose improved.  However, in the subsequent hours, he has become more symptomatic. He specifically denies focal pain, fever, cough, belly pain, nausea, vomiting, diarrhea.   Past Medical History  Diagnosis Date  . Diabetes mellitus   . Hypertension   . Depression   . GERD (gastroesophageal reflux disease)   . H/O hiatal hernia   . Neuromuscular disorder     neuropathy  . Sleep apnea     wears cpap at night  . 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.2) 10/05/2012  . Left foot drop 10/05/2012  . BPH (benign prostatic hyperplasia)   . Dyslipidemia   . Gait disturbance   . Cerebrovascular disease     Right thalamic, left parietal white matter strokes  . Sciatic neuropathy     Left-sided nerve  . Diabetic peripheral neuropathy   . Carpal tunnel syndrome of right wrist     Mild   Past Surgical History  Procedure Laterality Date  . Toe amputation  2008  . Eye surgery    . No past surgeries    . Fracture surgery      Questionable steel screws to right tibia   Family History  Problem  Relation Age of Onset  . Breast cancer Mother   . Melanoma Mother   . Diabetes Mother   . Diabetes Brother   . Diabetes Maternal Grandmother    History  Substance Use Topics  . Smoking status: Never Smoker   . Smokeless tobacco: Not on file  . Alcohol Use: 0.6 oz/week    1 Glasses of wine per week     Comment: Rare occasions    Review of Systems  All other systems reviewed and are negative.    Allergies  Latex and Sulfa antibiotics  Home Medications   Current Outpatient Rx  Name  Route  Sig  Dispense  Refill  . aspirin EC 81 MG EC tablet   Oral   Take 1 tablet (81 mg total) by mouth daily.         . Canagliflozin (INVOKANA) 300 MG TABS   Oral   Take 1 tablet by mouth daily.         Marland Kitchen escitalopram (LEXAPRO) 10 MG tablet   Oral   Take 10 mg by mouth daily.         . insulin glargine (LANTUS) 100 UNIT/ML injection   Subcutaneous   Inject 85 Units into the skin at bedtime.         . Liraglutide (VICTOZA) 18 MG/3ML SOPN   Subcutaneous   Inject 1.8 mg into the skin every morning.         Marland Kitchen  loratadine (CLARITIN) 10 MG tablet   Oral   Take 10 mg by mouth daily.         . meloxicam (MOBIC) 15 MG tablet   Oral   Take 15 mg by mouth daily.         Marland Kitchen omeprazole (PRILOSEC) 40 MG capsule   Oral   Take 40 mg by mouth 2 (two) times daily.         . pravastatin (PRAVACHOL) 40 MG tablet   Oral   Take 40 mg by mouth daily.         . Probiotic Product (PROBIOTIC PO)   Oral   Take 1 tablet by mouth daily.          BP 129/77  Pulse 104  Temp(Src) 98.4 F (36.9 C) (Oral)  Resp 20  SpO2 98% Physical Exam  Nursing note and vitals reviewed. Constitutional: He is oriented to person, place, and time. He appears well-developed. No distress.  HENT:  Head: Normocephalic and atraumatic.  Eyes: Conjunctivae and EOM are normal.  Cardiovascular: Regular rhythm.  Tachycardia present.   Pulmonary/Chest: Effort normal. No stridor. No respiratory  distress.  Abdominal: He exhibits no distension.  Musculoskeletal: He exhibits no edema.  Neurological: He is alert and oriented to person, place, and time. No cranial nerve deficit. He exhibits normal muscle tone. Coordination normal.  Skin: Skin is warm and dry.  Psychiatric: He has a normal mood and affect.    ED Course  Procedures (including critical care time) Labs Review Labs Reviewed  COMPREHENSIVE METABOLIC PANEL - Abnormal; Notable for the following:    Glucose, Bld 265 (*)    BUN 29 (*)    Calcium 10.8 (*)    GFR calc non Af Amer 65 (*)    GFR calc Af Amer 75 (*)    All other components within normal limits  CBC WITH DIFFERENTIAL - Abnormal; Notable for the following:    WBC 18.7 (*)    Neutrophils Relative % 88 (*)    Neutro Abs 16.4 (*)    Lymphocytes Relative 6 (*)    Monocytes Absolute 1.1 (*)    All other components within normal limits  GLUCOSE, CAPILLARY - Abnormal; Notable for the following:    Glucose-Capillary 251 (*)    All other components within normal limits  GLUCOSE, CAPILLARY - Abnormal; Notable for the following:    Glucose-Capillary 292 (*)    All other components within normal limits  BLOOD GAS, VENOUS   Imaging Review No results found. cardiac monitor 111 regular, abnormal EKG sinus tachycardia 103, abnormal Pulse oximetry is 94% on room air. abnormal  7:33 PM On repeat exam the patient appears generally well.  A review of labs with him and his wife, including hyperglycemia here.  We looked at the x-ray together, and I interpreted, there nonspecific bronchitic-like changes. Patient remains borderline hypoxic, with mild tachycardia.  With some concern for early respiratory infection, he'll be started on antibiotics. We discussed return precautions, follow up instructions, and the patient will be discharged.  MDM  No diagnosis found. Patient presents after an episode of hypoglycemia.  On my exam, and throughout the patient's emergency  department stay he had no decompensation, no episodes of confusion, disorientation or hypoglycemia.  His evaluation here is notable for suggestion of early respiratory infection given the mild x-ray abnormalities, tachycardia, mild hypoxia, and his cough.  This may be contributory to the hypoglycemia, the patient uses multiple anti-hyperglycemics, including insulin and pills  likely place a larger role.  I had a lengthy discussion with patient and his wife about return precautions, appropriate medication to take, and he was discharged in stable condition.   Gerhard Munch, MD 12/09/12 [phone]

## 2012-12-09 NOTE — ED Notes (Signed)
CBG is 251. Notified Nurse Alvino Chapel.

## 2012-12-09 NOTE — ED Notes (Signed)
Pt is unable to give an urine specimen at this time. The patient has been advised to use call light for assistance. The tech has reported the RN in charge.

## 2012-12-09 NOTE — ED Notes (Signed)
Phlebotomy notified to draw blood. 

## 2012-12-09 NOTE — ED Notes (Addendum)
Pts CBG has been unstable since 11pm.  Last night pts wife states that she thinks he had a hypoglycemic seizure, she woke up to him jerking.  Ems responded and his cbg was 15 at the time.  He was treated by ems but refused transport.  Today his cbg has been "all over the place".  Pt states that he has been feeling "lousy".  Pt denies any change in medication in the last 2 week.

## 2012-12-10 ENCOUNTER — Encounter (HOSPITAL_COMMUNITY): Payer: Self-pay | Admitting: Emergency Medicine

## 2012-12-10 DIAGNOSIS — E1149 Type 2 diabetes mellitus with other diabetic neurological complication: Secondary | ICD-10-CM | POA: Diagnosis present

## 2012-12-10 DIAGNOSIS — IMO0002 Reserved for concepts with insufficient information to code with codable children: Secondary | ICD-10-CM | POA: Diagnosis present

## 2012-12-10 DIAGNOSIS — E875 Hyperkalemia: Secondary | ICD-10-CM | POA: Diagnosis present

## 2012-12-10 DIAGNOSIS — G4733 Obstructive sleep apnea (adult) (pediatric): Secondary | ICD-10-CM | POA: Diagnosis present

## 2012-12-10 DIAGNOSIS — G5600 Carpal tunnel syndrome, unspecified upper limb: Secondary | ICD-10-CM | POA: Diagnosis present

## 2012-12-10 DIAGNOSIS — Z79899 Other long term (current) drug therapy: Secondary | ICD-10-CM

## 2012-12-10 DIAGNOSIS — E1165 Type 2 diabetes mellitus with hyperglycemia: Secondary | ICD-10-CM | POA: Diagnosis present

## 2012-12-10 DIAGNOSIS — Z808 Family history of malignant neoplasm of other organs or systems: Secondary | ICD-10-CM

## 2012-12-10 DIAGNOSIS — M216X9 Other acquired deformities of unspecified foot: Secondary | ICD-10-CM | POA: Diagnosis present

## 2012-12-10 DIAGNOSIS — Z833 Family history of diabetes mellitus: Secondary | ICD-10-CM

## 2012-12-10 DIAGNOSIS — S98139A Complete traumatic amputation of one unspecified lesser toe, initial encounter: Secondary | ICD-10-CM

## 2012-12-10 DIAGNOSIS — N400 Enlarged prostate without lower urinary tract symptoms: Secondary | ICD-10-CM | POA: Diagnosis present

## 2012-12-10 DIAGNOSIS — K219 Gastro-esophageal reflux disease without esophagitis: Secondary | ICD-10-CM | POA: Diagnosis present

## 2012-12-10 DIAGNOSIS — Z91040 Latex allergy status: Secondary | ICD-10-CM

## 2012-12-10 DIAGNOSIS — E1310 Other specified diabetes mellitus with ketoacidosis without coma: Principal | ICD-10-CM | POA: Diagnosis present

## 2012-12-10 DIAGNOSIS — E1142 Type 2 diabetes mellitus with diabetic polyneuropathy: Secondary | ICD-10-CM | POA: Diagnosis present

## 2012-12-10 DIAGNOSIS — F329 Major depressive disorder, single episode, unspecified: Secondary | ICD-10-CM | POA: Diagnosis present

## 2012-12-10 DIAGNOSIS — F3289 Other specified depressive episodes: Secondary | ICD-10-CM | POA: Diagnosis present

## 2012-12-10 DIAGNOSIS — Z794 Long term (current) use of insulin: Secondary | ICD-10-CM

## 2012-12-10 DIAGNOSIS — Z7982 Long term (current) use of aspirin: Secondary | ICD-10-CM

## 2012-12-10 DIAGNOSIS — E785 Hyperlipidemia, unspecified: Secondary | ICD-10-CM | POA: Diagnosis present

## 2012-12-10 DIAGNOSIS — I10 Essential (primary) hypertension: Secondary | ICD-10-CM | POA: Diagnosis present

## 2012-12-10 DIAGNOSIS — Z8673 Personal history of transient ischemic attack (TIA), and cerebral infarction without residual deficits: Secondary | ICD-10-CM

## 2012-12-10 DIAGNOSIS — Z882 Allergy status to sulfonamides status: Secondary | ICD-10-CM

## 2012-12-10 DIAGNOSIS — Z803 Family history of malignant neoplasm of breast: Secondary | ICD-10-CM

## 2012-12-10 DIAGNOSIS — R569 Unspecified convulsions: Secondary | ICD-10-CM | POA: Diagnosis present

## 2012-12-10 NOTE — ED Notes (Signed)
Pt. lost his balanced and fell at home this evening with nausea and vomitting seen here last night diagnosed with URI received antibiotic / prescription for Levaquin .

## 2012-12-11 ENCOUNTER — Inpatient Hospital Stay (HOSPITAL_COMMUNITY)
Admission: EM | Admit: 2012-12-11 | Discharge: 2012-12-15 | DRG: 294 | Disposition: A | Discharge status: Home / Self Care | Payer: BC Managed Care – PPO | Attending: Internal Medicine | Admitting: Internal Medicine

## 2012-12-11 ENCOUNTER — Encounter (HOSPITAL_COMMUNITY): Payer: Self-pay | Admitting: Family Medicine

## 2012-12-11 DIAGNOSIS — E1165 Type 2 diabetes mellitus with hyperglycemia: Secondary | ICD-10-CM

## 2012-12-11 DIAGNOSIS — I10 Essential (primary) hypertension: Secondary | ICD-10-CM

## 2012-12-11 DIAGNOSIS — G931 Anoxic brain damage, not elsewhere classified: Secondary | ICD-10-CM

## 2012-12-11 DIAGNOSIS — K529 Noninfective gastroenteritis and colitis, unspecified: Secondary | ICD-10-CM

## 2012-12-11 DIAGNOSIS — M216X9 Other acquired deformities of unspecified foot: Secondary | ICD-10-CM

## 2012-12-11 DIAGNOSIS — E1151 Type 2 diabetes mellitus with diabetic peripheral angiopathy without gangrene: Secondary | ICD-10-CM

## 2012-12-11 DIAGNOSIS — IMO0002 Reserved for concepts with insufficient information to code with codable children: Secondary | ICD-10-CM

## 2012-12-11 DIAGNOSIS — M21372 Foot drop, left foot: Secondary | ICD-10-CM

## 2012-12-11 DIAGNOSIS — E1142 Type 2 diabetes mellitus with diabetic polyneuropathy: Secondary | ICD-10-CM

## 2012-12-11 DIAGNOSIS — E1110 Type 2 diabetes mellitus with ketoacidosis without coma: Secondary | ICD-10-CM

## 2012-12-11 DIAGNOSIS — R112 Nausea with vomiting, unspecified: Secondary | ICD-10-CM

## 2012-12-11 LAB — BASIC METABOLIC PANEL
BUN: 40 mg/dL — ABNORMAL HIGH (ref 6–23)
BUN: 32 mg/dL — ABNORMAL HIGH (ref 6–23)
CO2: 18 mEq/L — ABNORMAL LOW (ref 19–32)
CO2: 17 mEq/L — ABNORMAL LOW (ref 19–32)
Calcium: 9.3 mg/dL (ref 8.4–10.5)
Calcium: 9.1 mg/dL (ref 8.4–10.5)
Calcium: 9.3 mg/dL (ref 8.4–10.5)
Chloride: 103 mEq/L (ref 96–112)
Chloride: 104 mEq/L (ref 96–112)
GFR calc Af Amer: 72 mL/min — ABNORMAL LOW (ref >90)
GFR calc non Af Amer: 62 mL/min — ABNORMAL LOW (ref >90)
GFR calc non Af Amer: 58 mL/min — ABNORMAL LOW (ref >90)
GFR calc non Af Amer: 75 mL/min — ABNORMAL LOW (ref >90)
Glucose, Bld: 214 mg/dL — ABNORMAL HIGH (ref 70–99)
Glucose, Bld: 165 mg/dL — ABNORMAL HIGH (ref 70–99)
Glucose, Bld: 173 mg/dL — ABNORMAL HIGH (ref 70–99)
Potassium: 4.5 mEq/L (ref 3.5–5.1)
Potassium: 4.4 mEq/L (ref 3.5–5.1)
Potassium: 4.1 mEq/L (ref 3.5–5.1)
Sodium: 136 mEq/L (ref 135–145)
Sodium: 139 mEq/L (ref 135–145)
Sodium: 139 mEq/L (ref 135–145)

## 2012-12-11 LAB — COMPREHENSIVE METABOLIC PANEL
ALT: 13 U/L (ref 0–53)
AST: 11 U/L (ref 0–37)
Alkaline Phosphatase: 88 U/L (ref 39–117)
GFR calc Af Amer: 76 mL/min — ABNORMAL LOW (ref >90)
Glucose, Bld: 326 mg/dL — ABNORMAL HIGH (ref 70–99)
Potassium: 5.5 mEq/L — ABNORMAL HIGH (ref 3.5–5.1)
Sodium: 136 mEq/L (ref 135–145)
Total Protein: 7.9 g/dL (ref 6.0–8.3)

## 2012-12-11 LAB — CBC
Hemoglobin: 11.9 g/dL — ABNORMAL LOW (ref 13.0–17.0)
MCH: 32.5 pg (ref 26.0–34.0)
MCHC: 32.7 g/dL (ref 30.0–36.0)
MCHC: 34.8 g/dL (ref 30.0–36.0)
Platelets: 230 10*3/uL (ref 150–400)
Platelets: 211 10*3/uL (ref 150–400)
RDW: 12.8 % (ref 11.5–15.5)
RDW: 12.5 % (ref 11.5–15.5)

## 2012-12-11 LAB — URINALYSIS, ROUTINE W REFLEX MICROSCOPIC
Nitrite: NEGATIVE
Protein, ur: NEGATIVE mg/dL
Specific Gravity, Urine: 1.021 (ref 1.005–1.030)
Urobilinogen, UA: 0.2 mg/dL (ref 0.0–1.0)

## 2012-12-11 LAB — GLUCOSE, CAPILLARY
Glucose-Capillary: 310 mg/dL — ABNORMAL HIGH (ref 70–99)
Glucose-Capillary: 146 mg/dL — ABNORMAL HIGH (ref 70–99)
Glucose-Capillary: 149 mg/dL — ABNORMAL HIGH (ref 70–99)

## 2012-12-11 LAB — CBC WITH DIFFERENTIAL/PLATELET
Basophils Absolute: 0.1 10*3/uL (ref 0.0–0.1)
Eosinophils Absolute: 0.1 10*3/uL (ref 0.0–0.7)
Lymphocytes Relative: 13 % (ref 12–46)
Lymphs Abs: 1.8 10*3/uL (ref 0.7–4.0)
MCH: 33.4 pg (ref 26.0–34.0)
Neutrophils Relative %: 80 % — ABNORMAL HIGH (ref 43–77)
Platelets: 225 10*3/uL (ref 150–400)
RBC: 4.01 MIL/uL — ABNORMAL LOW (ref 4.22–5.81)
WBC: 14.4 10*3/uL — ABNORMAL HIGH (ref 4.0–10.5)

## 2012-12-11 LAB — URINE MICROSCOPIC-ADD ON

## 2012-12-11 MED ORDER — SODIUM POLYSTYRENE SULFONATE 15 GM/60ML PO SUSP
15.0000 g | Freq: Once | ORAL | Status: AC
  Administered 2012-12-11: 15 g via ORAL
  Filled 2012-12-11: qty 60

## 2012-12-11 MED ORDER — ACETAMINOPHEN 650 MG RE SUPP: 650.0000 mg | Freq: Four times a day (QID) | RECTAL | Status: DC | PRN

## 2012-12-11 MED ORDER — SODIUM CHLORIDE 0.9 % IV BOLUS (SEPSIS)
1000.0000 mL | Freq: Once | INTRAVENOUS | Status: AC
  Administered 2012-12-11: 1000 mL via INTRAVENOUS

## 2012-12-11 MED ORDER — LORATADINE 10 MG PO TABS
10.0000 mg | ORAL_TABLET | Freq: Every day | ORAL | Status: DC
  Administered 2012-12-11 – 2012-12-15 (×5): 10 mg via ORAL
  Filled 2012-12-11 (×6): qty 1

## 2012-12-11 MED ORDER — DEXTROSE 50 % IV SOLN: 25.0000 mL | INTRAVENOUS | Status: DC | PRN

## 2012-12-11 MED ORDER — SODIUM CHLORIDE 0.9 % IV SOLN: INTRAVENOUS | Status: DC

## 2012-12-11 MED ORDER — ESCITALOPRAM OXALATE 10 MG PO TABS
10.0000 mg | ORAL_TABLET | Freq: Every day | ORAL | Status: DC
  Administered 2012-12-11 – 2012-12-15 (×5): 10 mg via ORAL
  Filled 2012-12-11 (×5): qty 1

## 2012-12-11 MED ORDER — SODIUM CHLORIDE 0.9 % IJ SOLN
3.0000 mL | Freq: Two times a day (BID) | INTRAMUSCULAR | Status: DC
  Administered 2012-12-11 – 2012-12-15 (×7): 3 mL via INTRAVENOUS

## 2012-12-11 MED ORDER — PANTOPRAZOLE SODIUM 40 MG PO TBEC
40.0000 mg | DELAYED_RELEASE_TABLET | Freq: Every day | ORAL | Status: DC
  Administered 2012-12-11 – 2012-12-15 (×5): 40 mg via ORAL
  Filled 2012-12-11 (×5): qty 1

## 2012-12-11 MED ORDER — HYDROCODONE-ACETAMINOPHEN 5-325 MG PO TABS: 1.0000 | ORAL_TABLET | ORAL | Status: DC | PRN

## 2012-12-11 MED ORDER — LIRAGLUTIDE 18 MG/3ML ~~LOC~~ SOPN: 1.8000 mg | PEN_INJECTOR | Freq: Every morning | SUBCUTANEOUS | Status: DC

## 2012-12-11 MED ORDER — ASPIRIN EC 81 MG PO TBEC
81.0000 mg | DELAYED_RELEASE_TABLET | Freq: Every day | ORAL | Status: DC
  Administered 2012-12-11 – 2012-12-15 (×5): 81 mg via ORAL
  Filled 2012-12-11 (×5): qty 1

## 2012-12-11 MED ORDER — INSULIN ASPART 100 UNIT/ML ~~LOC~~ SOLN
0.0000 [IU] | SUBCUTANEOUS | Status: DC
  Administered 2012-12-11: 8 [IU] via SUBCUTANEOUS
  Administered 2012-12-11: 11 [IU] via SUBCUTANEOUS

## 2012-12-11 MED ORDER — ENOXAPARIN SODIUM 40 MG/0.4ML ~~LOC~~ SOLN: 40.0000 mg | SUBCUTANEOUS | Status: DC

## 2012-12-11 MED ORDER — SODIUM CHLORIDE 0.9 % IV SOLN
INTRAVENOUS | Status: DC
  Administered 2012-12-11: 1.2 [IU]/h via INTRAVENOUS
  Administered 2012-12-12: 1.7 [IU]/h via INTRAVENOUS
  Administered 2012-12-12: 5 [IU]/h via INTRAVENOUS
  Administered 2012-12-12: 2.2 [IU]/h via INTRAVENOUS
  Administered 2012-12-13: 08:00:00 via INTRAVENOUS
  Filled 2012-12-11: qty 1

## 2012-12-11 MED ORDER — SODIUM CHLORIDE 0.9 % IV SOLN: INTRAVENOUS | Status: AC

## 2012-12-11 MED ORDER — INSULIN ASPART 100 UNIT/ML ~~LOC~~ SOLN: 0.0000 [IU] | SUBCUTANEOUS | Status: DC

## 2012-12-11 MED ORDER — DEXTROSE-NACL 5-0.45 % IV SOLN
INTRAVENOUS | Status: DC
  Administered 2012-12-11 – 2012-12-13 (×2): via INTRAVENOUS

## 2012-12-11 MED ORDER — ONDANSETRON HCL 4 MG/2ML IJ SOLN: 4.0000 mg | Freq: Four times a day (QID) | INTRAMUSCULAR | Status: DC | PRN

## 2012-12-11 MED ORDER — SODIUM CHLORIDE 0.9 % IV SOLN
INTRAVENOUS | Status: DC
  Administered 2012-12-11: 15:00:00 via INTRAVENOUS

## 2012-12-11 MED ORDER — ACETAMINOPHEN 325 MG PO TABS: 650.0000 mg | ORAL_TABLET | Freq: Four times a day (QID) | ORAL | Status: DC | PRN

## 2012-12-11 MED ORDER — ENOXAPARIN SODIUM 40 MG/0.4ML ~~LOC~~ SOLN
40.0000 mg | SUBCUTANEOUS | Status: DC
  Administered 2012-12-11 – 2012-12-14 (×4): 40 mg via SUBCUTANEOUS
  Filled 2012-12-11 (×5): qty 0.4

## 2012-12-11 MED ORDER — INSULIN GLARGINE 100 UNIT/ML ~~LOC~~ SOLN
75.0000 [IU] | Freq: Every day | SUBCUTANEOUS | Status: DC
  Filled 2012-12-11: qty 0.75

## 2012-12-11 MED ORDER — SODIUM CHLORIDE 0.9 % IV SOLN
INTRAVENOUS | Status: DC
  Administered 2012-12-11: 07:00:00 via INTRAVENOUS

## 2012-12-11 NOTE — H&P (Signed)
PCP:   DAY,JAMES, MD  Endocrinologist: March Rummage at [facility] in [facility]  Chief Complaint:  Persistent nausea vomiting  HPI: This is a 57 year old gentleman with known history of brittle diabetes mellitus, history of anoxic brain injury from an episode of severe hypoglycemia. On Saturday night his wife checked his fingerstick blood sugars and it was 491. She gave him his insulin, rechecked at 1 AM and his sugars was 290. They went to bed, she was awoken at 2 AM by husband seizing. 911 was called. She checked her fingerstick blood sugar it was 23, going up to 15 by time the EMS arrived. They were able to control his seizures and got his glucose up into the 70s. That night the patient refused to come to the ER. His fingerstick blood sugars has fluctuated since. He finally came to the ER the next day where he was treated for bronchitis and discharged home. He did speak with his endocrinologist and she decreased his insulin from 85 to 75. Per his wife the patient continues to have nausea and vomiting, he is unable to hold his medications down and his fingerstick blood sugars continue to fluctuate. He's getting weak, he has chills but no fevers, he has a cough. There is no reports of any shortness of breath, wheezing, burning on urination, polydipsia, polyphasia, diarrhea, altered mental status. The patient does not have a history of gastroparesis or heartburn. History provided by the patient as well as his wife was present at the bedside.   Review of Systems:  The patient denies anorexia, fever, weight loss,, vision loss, decreased hearing, hoarseness, chest pain, syncope, dyspnea on exertion, peripheral edema, balance deficits, hemoptysis, abdominal pain, melena, hematochezia, severe indigestion/heartburn, hematuria, incontinence, genital sores, muscle weakness, suspicious skin lesions, transient blindness, difficulty walking, depression, unusual weight change, abnormal bleeding, enlarged lymph  nodes, angioedema, and breast masses.  Past Medical History: Past Medical History  Diagnosis Date  . Diabetes mellitus   . Depression   . GERD (gastroesophageal reflux disease)   . H/O hiatal hernia   . Neuromuscular disorder     neuropathy  . Sleep apnea     wears cpap at night  . 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.2) 10/05/2012  . Left foot drop 10/05/2012  . Gait disturbance   . Cerebrovascular disease     Right thalamic, left parietal white matter strokes  . Sciatic neuropathy     Left-sided nerve  . Diabetic peripheral neuropathy    Past Surgical History  Procedure Laterality Date  . Toe amputation  2008  . Eye surgery    . Fracture surgery      Questionable steel screws to right tibia    Medications: Prior to Admission medications   Medication Sig Start Date End Date Taking? Authorizing Provider  aspirin EC 81 MG EC tablet Take 1 tablet (81 mg total) by mouth daily. 08/29/12  Yes Sorin Luanne Bras, MD  Canagliflozin (INVOKANA) 300 MG TABS Take 1 tablet by mouth daily.   Yes Historical Provider, MD  escitalopram (LEXAPRO) 10 MG tablet Take 10 mg by mouth daily.   Yes Historical Provider, MD  insulin glargine (LANTUS) 100 UNIT/ML injection Inject 85 Units into the skin at bedtime.   Yes Historical Provider, MD  levofloxacin (LEVAQUIN) 500 MG tablet Take 1 tablet (500 mg total) by mouth daily. 12/10/12 12/15/12 Yes Gerhard Munch, MD  Liraglutide (VICTOZA) 18 MG/3ML SOPN Inject 1.8 mg into the skin every morning.   Yes Historical Provider, MD  loratadine (CLARITIN)  10 MG tablet Take 10 mg by mouth daily.   Yes Historical Provider, MD  meloxicam (MOBIC) 15 MG tablet Take 15 mg by mouth daily.   Yes Historical Provider, MD  omeprazole (PRILOSEC) 40 MG capsule Take 40 mg by mouth 2 (two) times daily.   Yes Historical Provider, MD  pravastatin (PRAVACHOL) 40 MG tablet Take 40 mg by mouth daily.   Yes Historical Provider, MD  Probiotic Product (PROBIOTIC PO) Take 1 tablet by mouth daily.    Yes Historical Provider, MD    Allergies:   Allergies  Allergen Reactions  . Latex Swelling  . Sulfa Antibiotics Hives    Social History:  reports that he has never smoked. He does not have any smokeless tobacco history on file. He reports that he drinks about 0.6 ounces of alcohol per week. He reports that he does not use illicit drugs.  Family History: Family History  Problem Relation Age of Onset  . Breast cancer Mother   . Melanoma Mother   . Diabetes Mother   . Diabetes Brother   . Diabetes Maternal Grandmother     Physical Exam: Filed Vitals:   12/11/12 0008  BP: 119/59  Pulse: 103  Temp: 97.5 F (36.4 C)  TempSrc: Oral  Resp: 14  SpO2: 96%    General:  Alert and oriented times three, well developed and nourished, somewhat weak-appearing Eyes: PERRLA, pink conjunctiva, no scleral icterus ENT: Moist oral mucosa, neck supple, no thyromegaly Lungs: clear to ascultation, no wheeze, no crackles, no use of accessory muscles Cardiovascular: regular rate and rhythm, no regurgitation, no gallops, no murmurs. No carotid bruits, no JVD Abdomen: soft, positive BS, non-tender, non-distended, no organomegaly, not an acute abdomen GU: not examined Neuro: CN II - XII grossly intact, sensation intact Musculoskeletal: strength 5/5 all extremities, no clubbing, cyanosis or edema Skin: no rash, no subcutaneous crepitation, no decubitus Psych: appropriate patient   Labs on Admission:   Recent Labs  12/09/12 1511 12/10/12 2358  NA 135 136  K 4.6 5.5*  CL 96 96  CO2 27 15*  GLUCOSE 265* 326*  BUN 29* 38*  CREATININE 1.22 1.21  CALCIUM 10.8* 10.5    Recent Labs  12/09/12 1511 12/10/12 2358  AST 15 11  ALT 15 13  ALKPHOS 78 88  BILITOT 0.4 0.6  PROT 7.4 7.9  ALBUMIN 3.8 4.1   No results found for this basename: LIPASE, AMYLASE,  in the last 72 hours  Recent Labs  12/09/12 1511 12/10/12 2358  WBC 18.7* 14.4*  NEUTROABS 16.4* 11.5*  HGB 14.0 13.4  HCT  40.6 38.8*  MCV 94.0 96.8  PLT 228 225    Micro Results: No results found for this or any previous visit (from the past 240 hour(s)).   Radiological Exams on Admission: Dg Chest 2 View  12/09/2012   CLINICAL DATA:  Diabetes, unstable glycemia  EXAM: CHEST  2 VIEW  COMPARISON:  08/24/2012  FINDINGS: The heart size and mediastinal contours are within normal limits. No acute infiltrate or pulmonary edema. Central mild bronchitic changes. The visualized skeletal structures are unremarkable.  IMPRESSION: No acute infiltrate or pulmonary edema. Central mild bronchitic changes.   Electronically Signed   By: Natasha Mead   On: 12/09/2012 17:48    Assessment/Plan Present on Admission:  Nausea and vomiting Admit since patient is not tolerating by mouth and appears to be  slowly developing  DKA, Unclear etiology differential diagnosis does include gastroenteritis and gastroparesis Rule out infection  urinalysis and blood cultures ordered. No antibiotics currently Antiemetics ordered as needed  Possible early DKA/mild hyperkalemia Treat with aggressive IV fluid hydration for now Low-dose Kayexalate given, BMP and 9 AM  Home insulin regiment continued at 75 units q. hour sleep  Fingerstick blood sugars every 4 hours for now  . Brittle DM (diabetes mellitus) Apparently hypoglycemic seizure over the weekend History of Anoxic  brain injury from severe hypoglycemic episode Monitor closely  . HTN (hypertension) . 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

## 2012-12-11 NOTE — Progress Notes (Addendum)
Dr Joseph Art notified of CO2 = 9, he observed other labs as well and was informed his kayexalate dose was given a 0959 after these am labs were given, (kayexalate given later am when dose was received from pharmacy).  K level last pm 5.5, am lab drawn late am was 5.6, drawn before kayexalate could be given).

## 2012-12-11 NOTE — Progress Notes (Signed)
Pt transferred to 2c10 in stable condition. Wife made aware of transfer and stated she would be here around 10 pm

## 2012-12-11 NOTE — Progress Notes (Signed)
Talked with Dr. Joseph Art.  Suggested patient be placed on DKA order set.

## 2012-12-11 NOTE — ED Notes (Addendum)
Pts wife states that pt started with n/v Saturday night. Pt came to ED Sunday morning because of trouble regulating glucose. Pts wife states glucose ranges from 400 to 15.  Pt wife states that he has been vomiting and is unable to keep his antibiotics down.

## 2012-12-11 NOTE — ED Provider Notes (Signed)
CSN: [id number]     Arrival date & time 12/10/12  2347 History   First MD Initiated Contact with Patient 12/11/12 0359     Chief Complaint  Patient presents with  . Fall  . Emesis   (Consider location/radiation/quality/duration/timing/severity/associated sxs/prior Treatment) HPI Comments: This patient is a 57 year old male past medical history significant for diabetes. Presents with complaints of extreme weakness, nausea, and vomiting for the past several days. He was seen here last night and diagnosed with a bronchial infection. He was prescribed Levaquin, however he is unable to keep his medications down. His wife states that he is extremely weak and having difficulty walking. She states that he has no appetite and is unable to eat or drink anything.  The history is provided by the patient.    Past Medical History  Diagnosis Date  . Diabetes mellitus   . Hypertension   . Depression   . GERD (gastroesophageal reflux disease)   . H/O hiatal hernia   . Neuromuscular disorder     neuropathy  . Sleep apnea     wears cpap at night  . 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.2) 10/05/2012  . Left foot drop 10/05/2012  . BPH (benign prostatic hyperplasia)   . Dyslipidemia   . Gait disturbance   . Cerebrovascular disease     Right thalamic, left parietal white matter strokes  . Sciatic neuropathy     Left-sided nerve  . Diabetic peripheral neuropathy   . Carpal tunnel syndrome of right wrist     Mild   Past Surgical History  Procedure Laterality Date  . Toe amputation  2008  . Eye surgery    . No past surgeries    . Fracture surgery      Questionable steel screws to right tibia   Family History  Problem Relation Age of Onset  . Breast cancer Mother   . Melanoma Mother   . Diabetes Mother   . Diabetes Brother   . Diabetes Maternal Grandmother    History  Substance Use Topics  . Smoking status: Never Smoker   . Smokeless tobacco: Not on file  . Alcohol Use: 0.6 oz/week    1  Glasses of wine per week     Comment: Rare occasions    Review of Systems  Constitutional: Positive for fatigue.  All other systems reviewed and are negative.    Allergies  Latex and Sulfa antibiotics  Home Medications   Current Outpatient Rx  Name  Route  Sig  Dispense  Refill  . aspirin EC 81 MG EC tablet   Oral   Take 1 tablet (81 mg total) by mouth daily.         . Canagliflozin (INVOKANA) 300 MG TABS   Oral   Take 1 tablet by mouth daily.         Marland Kitchen escitalopram (LEXAPRO) 10 MG tablet   Oral   Take 10 mg by mouth daily.         . insulin glargine (LANTUS) 100 UNIT/ML injection   Subcutaneous   Inject 85 Units into the skin at bedtime.         Marland Kitchen levofloxacin (LEVAQUIN) 500 MG tablet   Oral   Take 1 tablet (500 mg total) by mouth daily.   6 tablet   0   . Liraglutide (VICTOZA) 18 MG/3ML SOPN   Subcutaneous   Inject 1.8 mg into the skin every morning.         . loratadine (CLARITIN) 10  MG tablet   Oral   Take 10 mg by mouth daily.         . meloxicam (MOBIC) 15 MG tablet   Oral   Take 15 mg by mouth daily.         Marland Kitchen omeprazole (PRILOSEC) 40 MG capsule   Oral   Take 40 mg by mouth 2 (two) times daily.         . pravastatin (PRAVACHOL) 40 MG tablet   Oral   Take 40 mg by mouth daily.         . Probiotic Product (PROBIOTIC PO)   Oral   Take 1 tablet by mouth daily.          BP 119/59  Pulse 103  Temp(Src) 97.5 F (36.4 C) (Oral)  Resp 14  SpO2 96% Physical Exam  Nursing note and vitals reviewed. Constitutional: He is oriented to person, place, and time. He appears well-developed and well-nourished. No distress.  HENT:  Head: Normocephalic.  Mucous membranes appear somewhat dry.  Eyes: EOM are normal. Pupils are equal, round, and reactive to light.  Neck: Normal range of motion. Neck supple.  Cardiovascular: Normal rate, regular rhythm and normal heart sounds.   No murmur heard. Pulmonary/Chest: Effort normal and breath  sounds normal. No respiratory distress. He has no wheezes.  Abdominal: Soft. Bowel sounds are normal. He exhibits no distension. There is no tenderness.  Musculoskeletal: Normal range of motion. He exhibits no edema.  Neurological: He is alert and oriented to person, place, and time. No cranial nerve deficit. He exhibits normal muscle tone. Coordination normal.  Skin: Skin is warm and dry. He is not diaphoretic.    ED Course  Procedures (including critical care time) Labs Review Labs Reviewed  CBC WITH DIFFERENTIAL - Abnormal; Notable for the following:    WBC 14.4 (*)    RBC 4.01 (*)    HCT 38.8 (*)    Neutrophils Relative % 80 (*)    Neutro Abs 11.5 (*)    All other components within normal limits  COMPREHENSIVE METABOLIC PANEL - Abnormal; Notable for the following:    Potassium 5.5 (*)    CO2 15 (*)    Glucose, Bld 326 (*)    BUN 38 (*)    GFR calc non Af Amer 66 (*)    GFR calc Af Amer 76 (*)    All other components within normal limits  GLUCOSE, CAPILLARY - Abnormal; Notable for the following:    Glucose-Capillary 313 (*)    All other components within normal limits  GLUCOSE, CAPILLARY - Abnormal; Notable for the following:    Glucose-Capillary 310 (*)    All other components within normal limits   Imaging Review Dg Chest 2 View  12/09/2012   CLINICAL DATA:  Diabetes, unstable glycemia  EXAM: CHEST  2 VIEW  COMPARISON:  08/24/2012  FINDINGS: The heart size and mediastinal contours are within normal limits. No acute infiltrate or pulmonary edema. Central mild bronchitic changes. The visualized skeletal structures are unremarkable.  IMPRESSION: No acute infiltrate or pulmonary edema. Central mild bronchitic changes.   Electronically Signed   By: Natasha Mead   On: 12/09/2012 17:48    MDM  No diagnosis found. Patient is a 57 year old male past medical history significant for diabetes with some sort of anoxic brain injury that occurred several years ago. He presents with  complaints of weakness and difficulty ambulating. Has also not had any appetite and has not been eating.  Workup reveals electrolytes suggestive of a developing diabetic ketoacidosis. He was given normal saline and I have contacted internal medicine for admission. Dr. Joneen Roach will see the patient.    Geoffery Lyons, MD 12/11/12 [phone]

## 2012-12-11 NOTE — Progress Notes (Signed)
[facility] PROGRESS NOTE  NICOLUS OSE WUJ:[id number] DOB: 08/18/1955 DOA: 12/11/2012 PCP: Marylou Flesher, MD  Assessment/Plan: 1. DKA; patient has an AG= 37, and ketones in his urine, although patient's last CBG= 268 after discussion with staff more appropriate to transfer patient to step down unit and start glucose stabilizer protocol. --We'll start glucose stabilizer protocol on this floor until bed available --Obtain hemoglobin A1c, lipid panel --Stopped all home diabetic medications  2. HTN; currently within ADA guidelines  3. Nausea and vomiting; resolved  4. Anoxic encephalopathy; patient was alert oriented x4 follow commands  5. left foot drop; stable, may want to provide wound care to shin and ankle   Code Status: Full Family Communication: Disposition Plan:    Consultants:  Procedures:  Antibiotics:     HPI/Subjective: 57-yo WM PMHx  brittle diabetes mellitus, history of anoxic brain injury from an episode of severe hypoglycemia, diabetic polyneuropathy, left footdrop (sciatic nerve injury), HTN. On Saturday night his wife checked his fingerstick blood sugars and it was 491. She gave him his insulin, rechecked at 1 AM and his sugars was 290. They went to bed, she was awoken at 2 AM by husband seizing. 911 was called. She checked her fingerstick blood sugar it was 23, going up to 15 by time the EMS arrived. They were able to control his seizures and got his glucose up into the 70s. That night the patient refused to come to the ER. His fingerstick blood sugars has fluctuated since. He finally came to the ER the next day where he was treated for bronchitis and discharged home. He did speak with his endocrinologist and she decreased his insulin from 85 to 75. Per his wife the patient continues to have nausea and vomiting, he is unable to hold his medications down and his fingerstick blood sugars continue to fluctuate. He's getting weak, he has chills but no fevers, he  has a cough. There is no reports of any shortness of breath, wheezing, burning on urination, polydipsia, polyphasia, diarrhea, altered mental status. The patient does not have a history of gastroparesis or heartburn. History provided by the patient as well as his wife was present at the bedside. NOTE; patient was admitted this a.m. at 0540.  Initial AG= 37. Currently patient said negative N./V., negative SOB, negative CP. States has not been taking full dose of Lantus this week in secondary to not eating well. Does not take mealtime coverage of insulin.   Objective: Filed Vitals:   12/11/12 0400 12/11/12 0545 12/11/12 0600 12/11/12 0656  BP: 145/71 151/61 148/61 130/58  Pulse: 104 108 109 107  Temp:    98.8 F (37.1 C)  TempSrc:      Resp:   20 18  Height:    6\' 3"  (1.905 m)  Weight:    114.579 kg (252 lb 9.6 oz)  SpO2: 97% 96% 97% 95%    Intake/Output Summary (Last 24 hours) at 12/11/12 1339 Last data filed at 12/11/12 1130  Gross per 24 hour  Intake    480 ml  Output   1500 ml  Net  -1020 ml   Filed Weights   12/11/12 0656  Weight: 114.579 kg (252 lb 9.6 oz)    Exam:   General: Alert and oriented x4,NAD  Cardiovascular: Regular rhythm and rate, negative murmurs rubs or gallops, DP/PT pulse 2+ bilateral  Respiratory: Clear to auscultation bilateral  Abdomen: Soft nontender nondistended plus bowel sounds  Musculoskeletal: Negative pedal edema, patient has lacerations on left  ankle/shin which are covered with new dressings   Data Reviewed: Basic Metabolic Panel:  Recent Labs Lab 12/09/12 1511 12/10/12 2358 12/11/12 0925  NA 135 136 136  K 4.6 5.5* 5.6*  CL 96 96 95*  CO2 27 15* 9*  GLUCOSE 265* 326* 327*  BUN 29* 38* 43*  CREATININE 1.22 1.21 1.27  CALCIUM 10.8* 10.5 9.3   Liver Function Tests:  Recent Labs Lab 12/09/12 1511 12/10/12 2358  AST 15 11  ALT 15 13  ALKPHOS 78 88  BILITOT 0.4 0.6  PROT 7.4 7.9  ALBUMIN 3.8 4.1   No results found for  this basename: LIPASE, AMYLASE,  in the last 168 hours No results found for this basename: AMMONIA,  in the last 168 hours CBC:  Recent Labs Lab 12/09/12 1511 12/10/12 2358 12/11/12 0925  WBC 18.7* 14.4* 13.9*  NEUTROABS 16.4* 11.5*  --   HGB 14.0 13.4 12.5*  HCT 40.6 38.8* 38.2*  MCV 94.0 96.8 99.2  PLT 228 225 230   Cardiac Enzymes: No results found for this basename: CKTOTAL, CKMB, CKMBINDEX, TROPONINI,  in the last 168 hours BNP (last 3 results) No results found for this basename: PROBNP,  in the last 8760 hours CBG:  Recent Labs Lab 12/11/12 0008 12/11/12 0238 12/11/12 0639 12/11/12 0758 12/11/12 1117  GLUCAP 313* 310* 321* 339* 268*    No results found for this or any previous visit (from the past 240 hour(s)).   Studies: Dg Chest 2 View  12/09/2012   CLINICAL DATA:  Diabetes, unstable glycemia  EXAM: CHEST  2 VIEW  COMPARISON:  08/24/2012  FINDINGS: The heart size and mediastinal contours are within normal limits. No acute infiltrate or pulmonary edema. Central mild bronchitic changes. The visualized skeletal structures are unremarkable.  IMPRESSION: No acute infiltrate or pulmonary edema. Central mild bronchitic changes.   Electronically Signed   By: Natasha Mead   On: 12/09/2012 17:48    Scheduled Meds: . aspirin EC  81 mg Oral Daily  . enoxaparin (LOVENOX) injection  40 mg Subcutaneous Q24H  . escitalopram  10 mg Oral Daily  . insulin aspart  0-15 Units Subcutaneous Q4H  . insulin glargine  75 Units Subcutaneous QHS  . Liraglutide  1.8 mg Subcutaneous q morning - 10a  . loratadine  10 mg Oral Daily  . pantoprazole  40 mg Oral Daily  . sodium chloride  3 mL Intravenous Q12H   Continuous Infusions: . 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

## 2012-12-12 LAB — GLUCOSE, CAPILLARY
Glucose-Capillary: 157 mg/dL — ABNORMAL HIGH (ref 70–99)
Glucose-Capillary: 168 mg/dL — ABNORMAL HIGH (ref 70–99)
Glucose-Capillary: 143 mg/dL — ABNORMAL HIGH (ref 70–99)
Glucose-Capillary: 157 mg/dL — ABNORMAL HIGH (ref 70–99)
Glucose-Capillary: 167 mg/dL — ABNORMAL HIGH (ref 70–99)
Glucose-Capillary: 226 mg/dL — ABNORMAL HIGH (ref 70–99)
Glucose-Capillary: 308 mg/dL — ABNORMAL HIGH (ref 70–99)
Glucose-Capillary: 113 mg/dL — ABNORMAL HIGH (ref 70–99)
Glucose-Capillary: 132 mg/dL — ABNORMAL HIGH (ref 70–99)
Glucose-Capillary: 202 mg/dL — ABNORMAL HIGH (ref 70–99)
Glucose-Capillary: 143 mg/dL — ABNORMAL HIGH (ref 70–99)
Glucose-Capillary: 172 mg/dL — ABNORMAL HIGH (ref 70–99)
Glucose-Capillary: 149 mg/dL — ABNORMAL HIGH (ref 70–99)
Glucose-Capillary: 161 mg/dL — ABNORMAL HIGH (ref 70–99)

## 2012-12-12 LAB — CBC
HCT: 36.4 % — ABNORMAL LOW (ref 39.0–52.0)
Hemoglobin: 12.7 g/dL — ABNORMAL LOW (ref 13.0–17.0)
MCH: 33.1 pg (ref 26.0–34.0)
MCHC: 34.9 g/dL (ref 30.0–36.0)
Platelets: 220 10*3/uL (ref 150–400)
RBC: 3.84 MIL/uL — ABNORMAL LOW (ref 4.22–5.81)
RDW: 12.6 % (ref 11.5–15.5)

## 2012-12-12 LAB — BASIC METABOLIC PANEL
BUN: 29 mg/dL — ABNORMAL HIGH (ref 6–23)
CO2: 20 mEq/L (ref 19–32)
CO2: 16 mEq/L — ABNORMAL LOW (ref 19–32)
CO2: 20 mEq/L (ref 19–32)
Calcium: 8.9 mg/dL (ref 8.4–10.5)
Chloride: 104 mEq/L (ref 96–112)
Chloride: 103 mEq/L (ref 96–112)
Chloride: 101 mEq/L (ref 96–112)
Chloride: 98 mEq/L (ref 96–112)
Chloride: 102 mEq/L (ref 96–112)
Creatinine, Ser: 1.01 mg/dL (ref 0.50–1.35)
Creatinine, Ser: 0.73 mg/dL (ref 0.50–1.35)
GFR calc Af Amer: >90 mL/min (ref >90)
GFR calc Af Amer: >90 mL/min (ref >90)
GFR calc Af Amer: >90 mL/min (ref >90)
GFR calc Af Amer: >90 mL/min (ref >90)
GFR calc non Af Amer: >90 mL/min (ref >90)
GFR calc non Af Amer: >90 mL/min (ref >90)
Glucose, Bld: 163 mg/dL — ABNORMAL HIGH (ref 70–99)
Glucose, Bld: 188 mg/dL — ABNORMAL HIGH (ref 70–99)
Potassium: 4.1 mEq/L (ref 3.5–5.1)
Potassium: 3.9 mEq/L (ref 3.5–5.1)
Potassium: 3.8 mEq/L (ref 3.5–5.1)
Potassium: 3.7 mEq/L (ref 3.5–5.1)
Potassium: 3.6 mEq/L (ref 3.5–5.1)
Sodium: 138 mEq/L (ref 135–145)
Sodium: 138 mEq/L (ref 135–145)
Sodium: 132 mEq/L — ABNORMAL LOW (ref 135–145)
Sodium: 137 mEq/L (ref 135–145)

## 2012-12-12 LAB — HEMOGLOBIN A1C: Mean Plasma Glucose: 255 mg/dL — ABNORMAL HIGH (ref <117)

## 2012-12-12 NOTE — Progress Notes (Signed)
Inpatient Diabetes Program Recommendations  AACE/ADA: New Consensus Statement on Inpatient Glycemic Control (2013)  Target Ranges:  Prepandial:   less than 140 mg/dL      Peak postprandial:   less than 180 mg/dL (1-2 hours)      Critically ill patients:  140 - 180 mg/dL   Reason for Visit: Note patient admitted with DKA.  Still remains on insulin drip.  CO2 remains 17.  Spoke to patient regarding history.  He has had diabetes for 15 years and has had anoxic brain injury from low blood sugar.  He used to take Humalog along with Lantus insulin however this was stopped due to low CBG's.  Patient states he was recently started on Invokana and dose was increased 2 weeks ago?  Called and discussed with Dr. Sharon Seller.  Will continue to follow.  No recommendations at this time.

## 2012-12-12 NOTE — Progress Notes (Signed)
Pt. Refused CPAP at this time. Pt. Encouraged to call RT if he wishes to wear CPAP during stat at hospital.

## 2012-12-12 NOTE — Progress Notes (Signed)
[facility] Progress Note [place] TEAM 1 - Stepdown/ICU TEAM   Thomas Frye WUJ:[id number] DOB: [birth year] DOA: 12/11/2012 PCP: Marylou Flesher, MD  Admit HPI / Brief Narrative: 57 year old gentleman with known history of brittle diabetes mellitus, history of anoxic brain injury from an episode of severe hypoglycemia. On Saturday night his wife checked his fingerstick blood sugars and it was 491. She gave him his insulin, rechecked at 1 AM and his sugars was 290. They went to bed, she was awoken at 2 AM by husband seizing. 911 was called. She checked her fingerstick blood sugar it was 23. They were able to control his seizures and got his glucose up into the 70s. That night the patient refused to come to the ER. His fingerstick blood sugars fluctuated since. He finally came to the ER the next day where he was treated for bronchitis and discharged home. He did speak with his Endocrinologist and she decreased his insulin from 85 to 75. Per his wife the patient continued to have nausea and vomiting, he was unable to hold his medications down and his fingerstick blood sugars continued to fluctuate.   Assessment/Plan:  DKA - brittle/uncontrolled DM2 Not yet resolved with gap remaining elevated and bicarbonate actually decreasing since last check - continue IV insulin - A1c indicates poor control at 10.5 - educated patient and wife extensively as to the nature of DKA and its proper treatment  Seizure activity Felt to be hypoglycemic convulsions - follow without change today  GERD Continue home treatment regimen  Sleep apnea Continue CPAP per home regimen  History of Anoxic brain injury from severe hypoglycemic episode  Hx of R thalamic and L parietal CVAs  HTN Well-controlled at this time  Code Status: FULL Family Communication: Spoke at length with patient and wife at bedside Disposition Plan: Stepdown unit  Consultants: None  Procedures: None  Antibiotics: None  DVT  prophylaxis: lovenox  HPI/Subjective: Patient is alert and oriented.  He is frustrated that he is in the hospital.  He denies chest pain nausea vomiting abdominal pain fevers or chills.  Objective: Blood pressure 136/74, pulse 86, temperature 97.7 F (36.5 C), temperature source Oral, resp. rate 15, height 6\' 3"  (1.905 m), weight 114.1 kg (251 lb 8.7 oz), SpO2 98.00%.  Intake/Output Summary (Last 24 hours) at 12/12/12 1130 Last data filed at 12/12/12 1000  Gross per 24 hour  Intake 3866.3 ml  Output   2550 ml  Net 1316.3 ml   Exam: General: No acute respiratory distress Lungs: Clear to auscultation bilaterally without wheezes or crackles Cardiovascular: Regular rate and rhythm without murmur gallop or rub normal S1 and S2 Abdomen: Nontender, nondistended, soft, bowel sounds positive, no rebound, no ascites, no appreciable mass Extremities: No significant cyanosis, clubbing, or edema bilateral lower extremities  Data Reviewed: Basic Metabolic Panel:  Recent Labs Lab 12/11/12 1448 12/11/12 1915 12/11/12 2205 12/12/12 0210 12/12/12 0610  NA 136 139 139 139 138  K 4.5 4.4 4.1 4.1 3.9  CL 98 103 104 104 103  CO2 13* 18* 17* 20 17*  GLUCOSE 214* 165* 173* 167* 163*  BUN 40* 35* 32* 29* 24*  CREATININE 1.34 1.21 1.09 1.01 0.86  CALCIUM 9.1 9.3 9.3 9.5 9.2  MG 2.7*  --   --   --   --   PHOS 4.2  --   --   --   --    Liver Function Tests:  Recent Labs Lab 12/09/12 1511 12/10/12 2358  AST 15  11  ALT 15 13  ALKPHOS 78 88  BILITOT 0.4 0.6  PROT 7.4 7.9  ALBUMIN 3.8 4.1   CBC:  Recent Labs Lab 12/09/12 1511 12/10/12 2358 12/11/12 0925 12/11/12 2205 12/12/12 0610  WBC 18.7* 14.4* 13.9* 9.8 9.9  NEUTROABS 16.4* 11.5*  --   --   --   HGB 14.0 13.4 12.5* 11.9* 12.7*  HCT 40.6 38.8* 38.2* 34.2* 36.4*  MCV 94.0 96.8 99.2 95.3 94.8  PLT 228 225 230 211 220   CBG:  Recent Labs Lab 12/11/12 1925 12/11/12 2025 12/11/12 2130 12/11/12 2241 12/11/12 2356   GLUCAP 146* 149* 171* 168* 157*    Recent Results (from the past 240 hour(s))  CULTURE, BLOOD (ROUTINE X 2)     Status: None   Collection Time    12/11/12  5:35 AM      Result Value Range Status   Specimen Description BLOOD RIGHT ANTECUBITAL   Final   Special Requests BOTTLES DRAWN AEROBIC ONLY 10CC   Final   Culture  Setup Time     Final   Value: 12/11/2012 09:05     Performed at [facility]   Culture     Final   Value:        BLOOD CULTURE RECEIVED NO GROWTH TO DATE CULTURE WILL BE HELD FOR 5 DAYS BEFORE ISSUING A FINAL NEGATIVE REPORT     Performed at [facility]   Report Status PENDING   Incomplete  CULTURE, BLOOD (ROUTINE X 2)     Status: None   Collection Time    12/11/12  5:40 AM      Result Value Range Status   Specimen Description BLOOD LEFT ARM   Final   Special Requests     Final   Value: BOTTLES DRAWN AEROBIC AND ANAEROBIC 10CC BLUE 5CC RED   Culture  Setup Time     Final   Value: 12/11/2012 09:05     Performed at [facility]   Culture     Final   Value:        BLOOD CULTURE RECEIVED NO GROWTH TO DATE CULTURE WILL BE HELD FOR 5 DAYS BEFORE ISSUING A FINAL NEGATIVE REPORT     Performed at [facility]   Report Status PENDING   Incomplete     Studies:  Recent x-ray studies have been reviewed in detail by the Attending Physician  Scheduled Meds:  Scheduled Meds: . aspirin EC  81 mg Oral Daily  . enoxaparin (LOVENOX) injection  40 mg Subcutaneous Q24H  . escitalopram  10 mg Oral Daily  . loratadine  10 mg Oral Daily  . pantoprazole  40 mg Oral Daily  . sodium chloride  3 mL Intravenous Q12H    Time spent on care of this patient: 35 mins   [facility] T  [facility] Office  [phone] Pager - Text Page per Loretha Stapler as per below:  On-Call/Text Page:      [email or web address]      password [email or web address]  If 7PM-7AM, please contact night-coverage [email or web address] Password [email or web address] 12/12/2012, 11:30 AM   LOS: 1 day

## 2012-12-13 ENCOUNTER — Ambulatory Visit: Payer: BC Managed Care – PPO | Admitting: Physical Therapy

## 2012-12-13 ENCOUNTER — Ambulatory Visit: Payer: BC Managed Care – PPO

## 2012-12-13 ENCOUNTER — Encounter (HOSPITAL_COMMUNITY): Payer: Self-pay | Admitting: *Deleted

## 2012-12-13 DIAGNOSIS — E1310 Other specified diabetes mellitus with ketoacidosis without coma: Principal | ICD-10-CM

## 2012-12-13 DIAGNOSIS — I798 Other disorders of arteries, arterioles and capillaries in diseases classified elsewhere: Secondary | ICD-10-CM

## 2012-12-13 DIAGNOSIS — K5289 Other specified noninfective gastroenteritis and colitis: Secondary | ICD-10-CM

## 2012-12-13 DIAGNOSIS — E1159 Type 2 diabetes mellitus with other circulatory complications: Secondary | ICD-10-CM

## 2012-12-13 DIAGNOSIS — R112 Nausea with vomiting, unspecified: Secondary | ICD-10-CM

## 2012-12-13 DIAGNOSIS — I10 Essential (primary) hypertension: Secondary | ICD-10-CM

## 2012-12-13 LAB — GLUCOSE, CAPILLARY
Glucose-Capillary: 136 mg/dL — ABNORMAL HIGH (ref 70–99)
Glucose-Capillary: 138 mg/dL — ABNORMAL HIGH (ref 70–99)
Glucose-Capillary: 146 mg/dL — ABNORMAL HIGH (ref 70–99)
Glucose-Capillary: 149 mg/dL — ABNORMAL HIGH (ref 70–99)
Glucose-Capillary: 168 mg/dL — ABNORMAL HIGH (ref 70–99)
Glucose-Capillary: 180 mg/dL — ABNORMAL HIGH (ref 70–99)
Glucose-Capillary: 184 mg/dL — ABNORMAL HIGH (ref 70–99)
Glucose-Capillary: 188 mg/dL — ABNORMAL HIGH (ref 70–99)
Glucose-Capillary: 202 mg/dL — ABNORMAL HIGH (ref 70–99)
Glucose-Capillary: 204 mg/dL — ABNORMAL HIGH (ref 70–99)

## 2012-12-13 LAB — BASIC METABOLIC PANEL
BUN: 12 mg/dL (ref 6–23)
BUN: 11 mg/dL (ref 6–23)
CO2: 17 mEq/L — ABNORMAL LOW (ref 19–32)
CO2: 18 mEq/L — ABNORMAL LOW (ref 19–32)
Calcium: 8.9 mg/dL (ref 8.4–10.5)
Chloride: 104 mEq/L (ref 96–112)
Chloride: 99 mEq/L (ref 96–112)
Chloride: 98 mEq/L (ref 96–112)
Creatinine, Ser: 0.71 mg/dL (ref 0.50–1.35)
GFR calc Af Amer: >90 mL/min (ref >90)
GFR calc Af Amer: >90 mL/min (ref >90)
GFR calc non Af Amer: >90 mL/min (ref >90)
GFR calc non Af Amer: >90 mL/min (ref >90)
GFR calc non Af Amer: >90 mL/min (ref >90)
Glucose, Bld: 149 mg/dL — ABNORMAL HIGH (ref 70–99)
Glucose, Bld: 139 mg/dL — ABNORMAL HIGH (ref 70–99)
Glucose, Bld: 194 mg/dL — ABNORMAL HIGH (ref 70–99)
Potassium: 3.5 mEq/L (ref 3.5–5.1)
Potassium: 3.4 mEq/L — ABNORMAL LOW (ref 3.5–5.1)
Potassium: 3.6 mEq/L (ref 3.5–5.1)
Potassium: 5.4 mEq/L — ABNORMAL HIGH (ref 3.5–5.1)
Sodium: 138 mEq/L (ref 135–145)
Sodium: 135 mEq/L (ref 135–145)

## 2012-12-13 MED ORDER — SODIUM CHLORIDE 0.9 % IV SOLN
INTRAVENOUS | Status: DC
  Filled 2012-12-13: qty 1

## 2012-12-13 MED ORDER — POTASSIUM CHLORIDE 10 MEQ/100ML IV SOLN
10.0000 meq | INTRAVENOUS | Status: AC
  Administered 2012-12-13 (×2): 10 meq via INTRAVENOUS
  Filled 2012-12-13 (×4): qty 100

## 2012-12-13 MED ORDER — INSULIN GLARGINE 100 UNIT/ML ~~LOC~~ SOLN
75.0000 [IU] | Freq: Every day | SUBCUTANEOUS | Status: DC
  Administered 2012-12-13 – 2012-12-15 (×3): 75 [IU] via SUBCUTANEOUS
  Filled 2012-12-13 (×3): qty 0.75

## 2012-12-13 MED ORDER — POTASSIUM CHLORIDE CRYS ER 20 MEQ PO TBCR
40.0000 meq | EXTENDED_RELEASE_TABLET | Freq: Once | ORAL | Status: AC
  Administered 2012-12-13: 40 meq via ORAL

## 2012-12-13 MED ORDER — POTASSIUM CHLORIDE CRYS ER 20 MEQ PO TBCR
40.0000 meq | EXTENDED_RELEASE_TABLET | Freq: Two times a day (BID) | ORAL | Status: DC
  Filled 2012-12-13: qty 2

## 2012-12-13 MED ORDER — INSULIN ASPART 100 UNIT/ML ~~LOC~~ SOLN
0.0000 [IU] | Freq: Every day | SUBCUTANEOUS | Status: DC
  Administered 2012-12-13: 2 [IU] via SUBCUTANEOUS

## 2012-12-13 MED ORDER — INSULIN ASPART 100 UNIT/ML ~~LOC~~ SOLN
0.0000 [IU] | Freq: Three times a day (TID) | SUBCUTANEOUS | Status: DC
  Administered 2012-12-13: 1 [IU] via SUBCUTANEOUS
  Administered 2012-12-13: 3 [IU] via SUBCUTANEOUS
  Administered 2012-12-14: 2 [IU] via SUBCUTANEOUS
  Administered 2012-12-14: 1 [IU] via SUBCUTANEOUS
  Administered 2012-12-14: 2 [IU] via SUBCUTANEOUS
  Administered 2012-12-15: 1 [IU] via SUBCUTANEOUS

## 2012-12-13 NOTE — Progress Notes (Signed)
Patient has been refusing CPAP machine. Patient is aware to call if he changes his mind about the unit. RT will continue to assist as needed.

## 2012-12-13 NOTE — Progress Notes (Signed)
[facility] Progress Note [place] TEAM 1 - Stepdown/ICU TEAM   Thomas Frye JYN:[id number] DOB: Feb 15, 1956 DOA: 12/11/2012 PCP: Marylou Flesher, MD  Admit HPI / Brief Narrative: 57 year old gentleman with known history of brittle diabetes mellitus, history of anoxic brain injury from an episode of severe hypoglycemia. On Saturday night his wife checked his fingerstick blood sugars and it was 491. She gave him his insulin, rechecked at 1 AM and his sugars was 290. They went to bed, she was awoken at 2 AM by husband seizing. 911 was called. She checked her fingerstick blood sugar it was 23. They were able to control his seizures and got his glucose up into the 70s. That night the patient refused to come to the ER. His fingerstick blood sugars fluctuated since. He finally came to the ER the next day where he was treated for bronchitis and discharged home. He did speak with his Endocrinologist and she decreased his insulin from 85 to 75. Per his wife the patient continued to have nausea and vomiting, he was unable to hold his medications down and his fingerstick blood sugars continued to fluctuate.   Assessment/Plan:  DKA - brittle/uncontrolled DM2 AG has closed so transition to Lantus only at this juncture- may need to defer resume of other agents to OP endocrinologist March Rummage) - A1c indicates poor control at 10.5 - educated patient and wife extensively as to the nature of DKA and its proper treatment-clarified home meds: On Victoza, Invokana and Lantus insuli- recent up titrate in Invokana prior to admit and Lantus decreased to 75 U from 85 hours prior to admission - will resume lantus at 75 U and follow  Seizure activity Felt to be hypoglycemic convulsions - no recurrence  GERD Continue home treatment regimen  Sleep apnea Continue CPAP per home regimen  History of Anoxic brain injury from severe hypoglycemic episode  Hx of R thalamic and L parietal CVAs -wears LLE  brace  HTN Well-controlled at this time  Code Status: FULL Family Communication: Spoke at length with patient and wife at bedside Disposition Plan: Keep on SDU to monitor CBG closely with resumption of LA insulin  Consultants: None  Procedures: None  Antibiotics: None  DVT prophylaxis: lovenox  HPI/Subjective: Patient is alert and oriented.  Discussed how Victoza and Invokana work to decrease blood sugar noting both meds require adequate oral intake of food/carbs. No CP or SOB.  Objective: Blood pressure 118/50, pulse 87, temperature 98 F (36.7 C), temperature source Oral, resp. rate 15, height 6\' 3"  (1.905 m), weight 114.1 kg (251 lb 8.7 oz), SpO2 96.00%.  Intake/Output Summary (Last 24 hours) at 12/13/12 1203 Last data filed at 12/13/12 1100  Gross per 24 hour  Intake 2384.75 ml  Output   2650 ml  Net -265.25 ml   Exam: General: No acute respiratory distress Lungs: Clear to auscultation bilaterally without wheezes or crackles Cardiovascular: Regular rate and rhythm without murmur gallop or rub normal S1 and S2 Abdomen: Nontender, nondistended, soft, bowel sounds positive, no rebound, no ascites, no appreciable mass Extremities: No significant cyanosis, clubbing, or edema bilateral lower extremities  Data Reviewed: Basic Metabolic Panel:  Recent Labs Lab 12/11/12 1448  12/12/12 1548 12/12/12 1955 12/13/12 0020 12/13/12 0408 12/13/12 0830  NA 136  < > 132* 137 138 137 135  K 4.5  < > 3.7 3.6 3.5 3.4* 3.6  CL 98  < > 98 102 104 102 99  CO2 13*  < > 16* 20 22 22  17*  GLUCOSE 214*  < > 186* 188* 149* 139* 194*  BUN 40*  < > 18 16 13 12 11   CREATININE 1.34  < > 0.73 0.73 0.70 0.71 0.68  CALCIUM 9.1  < > 9.1 8.9 9.0 8.9 9.2  MG 2.7*  --   --   --   --   --   --   PHOS 4.2  --   --   --   --   --   --   < > = values in this interval not displayed. Liver Function Tests:  Recent Labs Lab 12/09/12 1511 12/10/12 2358  AST 15 11  ALT 15 13  ALKPHOS 78 88   BILITOT 0.4 0.6  PROT 7.4 7.9  ALBUMIN 3.8 4.1   CBC:  Recent Labs Lab 12/09/12 1511 12/10/12 2358 12/11/12 0925 12/11/12 2205 12/12/12 0610  WBC 18.7* 14.4* 13.9* 9.8 9.9  NEUTROABS 16.4* 11.5*  --   --   --   HGB 14.0 13.4 12.5* 11.9* 12.7*  HCT 40.6 38.8* 38.2* 34.2* 36.4*  MCV 94.0 96.8 99.2 95.3 94.8  PLT 228 225 230 211 220   CBG:  Recent Labs Lab 12/13/12 0532 12/13/12 0656 12/13/12 0758 12/13/12 0911 12/13/12 1021  GLUCAP 148* 180* 188* 184* 168*    Recent Results (from the past 240 hour(s))  CULTURE, BLOOD (ROUTINE X 2)     Status: None   Collection Time    12/11/12  5:35 AM      Result Value Range Status   Specimen Description BLOOD RIGHT ANTECUBITAL   Final   Special Requests BOTTLES DRAWN AEROBIC ONLY 10CC   Final   Culture  Setup Time     Final   Value: 12/11/2012 09:05     Performed at [facility]   Culture     Final   Value:        BLOOD CULTURE RECEIVED NO GROWTH TO DATE CULTURE WILL BE HELD FOR 5 DAYS BEFORE ISSUING A FINAL NEGATIVE REPORT     Performed at [facility]   Report Status PENDING   Incomplete  CULTURE, BLOOD (ROUTINE X 2)     Status: None   Collection Time    12/11/12  5:40 AM      Result Value Range Status   Specimen Description BLOOD LEFT ARM   Final   Special Requests     Final   Value: BOTTLES DRAWN AEROBIC AND ANAEROBIC 10CC BLUE 5CC RED   Culture  Setup Time     Final   Value: 12/11/2012 09:05     Performed at [facility]   Culture     Final   Value:        BLOOD CULTURE RECEIVED NO GROWTH TO DATE CULTURE WILL BE HELD FOR 5 DAYS BEFORE ISSUING A FINAL NEGATIVE REPORT     Performed at [facility]   Report Status PENDING   Incomplete     Studies:  Recent x-ray studies have been reviewed in detail by the Attending Physician  Scheduled Meds:  Scheduled Meds: . aspirin EC  81 mg Oral Daily  . enoxaparin (LOVENOX) injection  40 mg Subcutaneous Q24H  . escitalopram  10 mg Oral  Daily  . insulin aspart  0-5 Units Subcutaneous QHS  . insulin aspart  0-9 Units Subcutaneous TID WC  . insulin glargine  75 Units Subcutaneous Daily  . loratadine  10 mg Oral Daily  . pantoprazole  40 mg Oral Daily  . sodium chloride  3 mL Intravenous Q12H    Time spent on care of this patient: 35 mins   ELLIS,ALLISON L. ANP  [facility] Office  [phone] Pager - Text Page per Loretha Stapler as per below:  On-Call/Text Page:      [email or web address]      password [email or web address]  If 7PM-7AM, please contact night-coverage [email or web address] Password [facility] 12/13/2012, 12:03 PM   LOS: 2 days   I have examined the patient, reviewed the chart and modified the above note which I agree with.   Cordae Mccarey,MD [phone] 12/13/2012, 4:52 PM

## 2012-12-14 LAB — GLUCOSE, CAPILLARY
Glucose-Capillary: 164 mg/dL — ABNORMAL HIGH (ref 70–99)
Glucose-Capillary: 168 mg/dL — ABNORMAL HIGH (ref 70–99)

## 2012-12-14 LAB — BASIC METABOLIC PANEL
CO2: 22 mEq/L (ref 19–32)
Chloride: 101 mEq/L (ref 96–112)
Glucose, Bld: 164 mg/dL — ABNORMAL HIGH (ref 70–99)
Sodium: 139 mEq/L (ref 135–145)

## 2012-12-14 MED ORDER — SODIUM CHLORIDE 0.9 % IV SOLN
INTRAVENOUS | Status: DC
  Administered 2012-12-14: 10 mL/h via INTRAVENOUS

## 2012-12-14 NOTE — Progress Notes (Signed)
[facility] Progress Note [place] TEAM 1 - Stepdown/ICU TEAM   Thomas Frye ZOX:[id number] DOB: 19-Oct-1955 DOA: 12/11/2012 PCP: Marylou Flesher, MD  Admit HPI / Brief Narrative: 57 year old gentleman with known history of brittle diabetes mellitus, history of anoxic brain injury from an episode of severe hypoglycemia. On Saturday night his wife checked his fingerstick blood sugars and it was 491. She gave him his insulin, rechecked at 1 AM and his sugars was 290. They went to bed, she was awoken at 2 AM by husband seizing. 911 was called. She checked her fingerstick blood sugar it was 23. They were able to control his seizures and got his glucose up into the 70s. That night the patient refused to come to the ER. His fingerstick blood sugars fluctuated since. He finally came to the ER the next day where he was treated for bronchitis and discharged home. He did speak with his Endocrinologist and she decreased his insulin from 85 to 75. Per his wife the patient continued to have nausea and vomiting, he was unable to hold his medications down and his fingerstick blood sugars continued to fluctuate.   Assessment/Plan:  DKA - brittle/uncontrolled DM2 Continue Lantus with SSI- will defer resume of other agents to OP endocrinologist (Autumn Jones) - A1c indicates poor control at 10.5 - educated patient and wife extensively as to the nature of DKA and its proper treatment-clarified home meds: Victoza, Invokana and Lantus insulin- recent up titrate in [place] prior to admit and Lantus decreased to 75 U from 85 U prior to admission  Seizure activity Felt to be hypoglycemic convulsions - no recurrence  GERD Continue home treatment regimen  Sleep apnea Continue CPAP per home regimen  History of Anoxic brain injury from severe hypoglycemic episode  Hx of R thalamic and L parietal CVAs -wears LLE brace  HTN Well-controlled at this time  Code Status: FULL Family Communication: Spoke with  patient at bedside Disposition Plan: Keep on SDU to monitor CBG closely with resumption of LA insulin-possible dc in am  Consultants: None  Procedures: None  Antibiotics: None  DVT prophylaxis: lovenox  HPI/Subjective: Patient is alert and oriented. No complaints. Plans to follow up next week with endocrinologist after dc.  Objective: Blood pressure 141/76, pulse 77, temperature 98 F (36.7 C), temperature source Oral, resp. rate 14, height 6\' 3"  (1.905 m), weight 114.1 kg (251 lb 8.7 oz), SpO2 95.00%.  Intake/Output Summary (Last 24 hours) at 12/14/12 1216 Last data filed at 12/14/12 1000  Gross per 24 hour  Intake    483 ml  Output   3700 ml  Net  -3217 ml   Exam: General: No acute respiratory distress Lungs: Clear to auscultation bilaterally without wheezes or crackles Cardiovascular: Regular rate and rhythm without murmur gallop or rub normal S1 and S2 Abdomen: Nontender, nondistended, soft, bowel sounds positive, no rebound, no ascites, no appreciable mass Extremities: No significant cyanosis, clubbing, or edema bilateral lower extremities  Data Reviewed: Basic Metabolic Panel:  Recent Labs Lab 12/11/12 1448  12/13/12 0020 12/13/12 0408 12/13/12 0830 12/13/12 1722 12/14/12 0415  NA 136  < > 138 137 135 131* 139  K 4.5  < > 3.5 3.4* 3.6 5.4* 3.7  CL 98  < > 104 102 99 98 101  CO2 13*  < > 22 22 17* 18* 22  GLUCOSE 214*  < > 149* 139* 194* 191* 164*  BUN 40*  < > 13 12 11 10 11   CREATININE 1.34  < >  0.70 0.71 0.68 0.77 0.73  CALCIUM 9.1  < > 9.0 8.9 9.2 9.1 9.4  MG 2.7*  --   --   --   --   --   --   PHOS 4.2  --   --   --   --   --   --   < > = values in this interval not displayed. Liver Function Tests:  Recent Labs Lab 12/09/12 1511 12/10/12 2358  AST 15 11  ALT 15 13  ALKPHOS 78 88  BILITOT 0.4 0.6  PROT 7.4 7.9  ALBUMIN 3.8 4.1   CBC:  Recent Labs Lab 12/09/12 1511 12/10/12 2358 12/11/12 0925 12/11/12 2205 12/12/12 0610  WBC  18.7* 14.4* 13.9* 9.8 9.9  NEUTROABS 16.4* 11.5*  --   --   --   HGB 14.0 13.4 12.5* 11.9* 12.7*  HCT 40.6 38.8* 38.2* 34.2* 36.4*  MCV 94.0 96.8 99.2 95.3 94.8  PLT 228 225 230 211 220   CBG:  Recent Labs Lab 12/13/12 1221 12/13/12 1651 12/13/12 2102 12/14/12 0743 12/14/12 1153  GLUCAP 138* 204* 202* 164* 168*    Recent Results (from the past 240 hour(s))  CULTURE, BLOOD (ROUTINE X 2)     Status: None   Collection Time    12/11/12  5:35 AM      Result Value Range Status   Specimen Description BLOOD RIGHT ANTECUBITAL   Final   Special Requests BOTTLES DRAWN AEROBIC ONLY 10CC   Final   Culture  Setup Time     Final   Value: 12/11/2012 09:05     Performed at [facility]   Culture     Final   Value:        BLOOD CULTURE RECEIVED NO GROWTH TO DATE CULTURE WILL BE HELD FOR 5 DAYS BEFORE ISSUING A FINAL NEGATIVE REPORT     Performed at [facility]   Report Status PENDING   Incomplete  CULTURE, BLOOD (ROUTINE X 2)     Status: None   Collection Time    12/11/12  5:40 AM      Result Value Range Status   Specimen Description BLOOD LEFT ARM   Final   Special Requests     Final   Value: BOTTLES DRAWN AEROBIC AND ANAEROBIC 10CC BLUE 5CC RED   Culture  Setup Time     Final   Value: 12/11/2012 09:05     Performed at [facility]   Culture     Final   Value:        BLOOD CULTURE RECEIVED NO GROWTH TO DATE CULTURE WILL BE HELD FOR 5 DAYS BEFORE ISSUING A FINAL NEGATIVE REPORT     Performed at [facility]   Report Status PENDING   Incomplete     Studies:  Recent x-ray studies have been reviewed in detail by the Attending Physician  Scheduled Meds:  Scheduled Meds: . aspirin EC  81 mg Oral Daily  . enoxaparin (LOVENOX) injection  40 mg Subcutaneous Q24H  . escitalopram  10 mg Oral Daily  . insulin aspart  0-5 Units Subcutaneous QHS  . insulin aspart  0-9 Units Subcutaneous TID WC  . insulin glargine  75 Units Subcutaneous Daily  .  loratadine  10 mg Oral Daily  . pantoprazole  40 mg Oral Daily  . sodium chloride  3 mL Intravenous Q12H    Time spent on care of this patient: 35 mins   ELLIS,ALLISON L.  ANP  [facility] Office  [phone] Pager - Text Page per Loretha Stapler as per below:  On-Call/Text Page:      [email or web address]      password [email or web address]  If 7PM-7AM, please contact night-coverage [email or web address] Password [email or web address] 12/14/2012, 12:16 PM   LOS: 3 days   I have personally examined this patient and reviewed the entire database. I have reviewed the above note, made any necessary editorial changes, and agree with its content.  Lonia Blood, MD [facility]

## 2012-12-14 NOTE — Progress Notes (Signed)
Patient continues to refuse CPAP unit. RT will continue to assist as needed.

## 2012-12-14 NOTE — Progress Notes (Signed)
Inpatient Diabetes Program Recommendations  AACE/ADA: New Consensus Statement on Inpatient Glycemic Control (2013)  Target Ranges:  Prepandial:   less than 140 mg/dL      Peak postprandial:   less than 180 mg/dL (1-2 hours)      Critically ill patients:  140 - 180 mg/dL   Reason for Visit: Patient off insulin drip.  Now on home dose of Lantus 75 units daily.  Discussed patient with Dr. Sharon Seller.  He states that Invokana will not be resumed and patient is to follow-up with endocrinologist after discharge.      Beryl Meager, RN, BC-ADM Inpatient Diabetes Coordinator Pager [phone]

## 2012-12-15 LAB — BASIC METABOLIC PANEL
BUN: 12 mg/dL (ref 6–23)
Calcium: 9.5 mg/dL (ref 8.4–10.5)
Creatinine, Ser: 0.69 mg/dL (ref 0.50–1.35)
GFR calc Af Amer: >90 mL/min (ref >90)
GFR calc non Af Amer: >90 mL/min (ref >90)

## 2012-12-15 MED ORDER — INSULIN GLARGINE 100 UNIT/ML ~~LOC~~ SOLN: 75.0000 [IU] | Freq: Every day | SUBCUTANEOUS | Status: DC

## 2012-12-15 MED ORDER — INSULIN ASPART 100 UNIT/ML ~~LOC~~ SOLN: 0.0000 [IU] | Freq: Three times a day (TID) | SUBCUTANEOUS | Status: DC

## 2012-12-15 MED ORDER — INSULIN ASPART 100 UNIT/ML ~~LOC~~ SOLN: SUBCUTANEOUS | Status: DC

## 2012-12-15 NOTE — Progress Notes (Signed)
D/c instructions reviewed and discussed with patient, and pt verbalized understanding. Pt d/c home with family.

## 2012-12-15 NOTE — Discharge Summary (Signed)
DISCHARGE SUMMARY  Thomas Frye  MR#: [id number]  DOB:1955/03/29  Date of Admission: 12/11/2012 Date of Discharge: 12/15/2012  Attending Physician:Ritchard Paragas T  Patient's PCP:DAY,JAMES, MD  Consults: none  Disposition: D/C Home   Follow-up Appts:     Follow-up Information   Follow up with Your Endocrinologist. (keep your scheduled apointment for 12/19/2012)      Discharge Diagnoses: DKA - brittle/uncontrolled DM2  Seizure activity in setting of hypoglycemia GERD  Sleep apnea  History of Anoxic brain injury from severe hypoglycemic episode  Hx of R thalamic and L parietal CVAs  HTN   Initial presentation: 57 year old gentleman with known history of brittle diabetes mellitus with a history of anoxic brain injury from an episode of severe hypoglycemia. On Saturday night his wife checked his fingerstick blood sugars and it was 491. She gave him his insulin, rechecked at 1 AM and his sugars was 290. They went to bed, she was awoken at 2 AM by husband seizing. 911 was called. She checked her fingerstick blood sugar it was 23. With help from EMS the patient and his wife were able to control his seizures and got his glucose up into the 70s. That night the patient refused to come to the ER. His fingerstick blood sugars fluctuated since. He finally came to the ER the next day where he was treated for bronchitis and discharged home. He did speak with his Endocrinologist and she decreased his insulin from 85 to 75. Per his wife the patient continued to have nausea and vomiting, he was unable to hold his medications down and his fingerstick blood sugars continued to fluctuate.  She was therefore able to convince him to return to the ER.  Hospital Course:  DKA - brittle/uncontrolled DM2  Continue Lantus with SSI - will defer resume of other agents to OP Endocrinologist, though use of Invokana is likely not adviseable given possible connection to his difficult to correct DKA  - A1c  indicated poor control at 10.5 - educated patient and wife extensively as to the nature of DKA and its proper treatment - clarified home meds: Victoza, Invokana and Lantus insulin - recent up titrate in Invokana prior to admit and Lantus decreased to 75 U from 85 U prior to admission - will d/c home on lantus + SSI - pt has schedule f/u w/ his Endo doc in 4 days   Seizure activity  Felt to be hypoglycemic convulsions - no recurrence during hospital stay   GERD  Continue home treatment regimen   Sleep apnea  Continue CPAP per home regimen   History of Anoxic brain injury from severe hypoglycemic episode  Hx of R thalamic and L parietal CVAs  wears LLE brace - no acute complications encountered during this hospital stay   HTN  No changes made in home antihypertensive regimen     Medication List    STOP taking these medications       INVOKANA 300 MG Tabs  Generic drug:  Canagliflozin     levofloxacin 500 MG tablet  Commonly known as:  LEVAQUIN     VICTOZA 18 MG/3ML Sopn  Generic drug:  Liraglutide      TAKE these medications       aspirin 81 MG EC tablet  Take 1 tablet (81 mg total) by mouth daily.     escitalopram 10 MG tablet  Commonly known as:  LEXAPRO  Take 10 mg by mouth daily.     insulin aspart 100 UNIT/ML injection  Commonly known as:  novoLOG  - CHECK YOUR CBG 3 TIMES A DAY WITH MEALS AND USE A SLIDING SCALE AS FOLLOWS:  - For CBG 121-150 inject 1unit of insulin  - For CBG 151-200 inject 2units of insulin  - For CBG 201-250 inject 3units of insulin   - For CBG 251-300 inject 5units of insulin  - For CBG 301-350 inject 7units of insulin  - For CBG 351-400 inject 9units of insulin  - For CBG >400 call your MD     insulin glargine 100 UNIT/ML injection  Commonly known as:  LANTUS  Inject 0.75 mLs (75 Units total) into the skin at bedtime.     loratadine 10 MG tablet  Commonly known as:  CLARITIN  Take 10 mg by mouth daily.     meloxicam 15 MG  tablet  Commonly known as:  MOBIC  Take 15 mg by mouth daily.     omeprazole 40 MG capsule  Commonly known as:  PRILOSEC  Take 40 mg by mouth 2 (two) times daily.     pravastatin 40 MG tablet  Commonly known as:  PRAVACHOL  Take 40 mg by mouth daily.     PROBIOTIC PO  Take 1 tablet by mouth daily.        Day of Discharge BP 157/92  Pulse 78  Temp(Src) 98.1 F (36.7 C) (Oral)  Resp 20  Ht 6\' 3"  (1.905 m)  Wt 116 kg (255 lb 11.7 oz)  BMI 31.96 kg/m2  SpO2 94%  Physical Exam: General: No acute respiratory distress - alert and oriented  Lungs: Clear to auscultation bilaterally without wheezes or crackles Cardiovascular: Regular rate and rhythm without murmur gallop or rub normal S1 and S2 Abdomen: Nontender, nondistended, soft, bowel sounds positive, no rebound, no ascites, no appreciable mass Extremities: No significant cyanosis, clubbing, or edema bilateral lower extremities  Results for orders placed during the hospital encounter of 12/11/12 (from the past 24 hour(s))  GLUCOSE, CAPILLARY     Status: Abnormal   Collection Time    12/14/12 11:53 AM      Result Value Range   Glucose-Capillary 168 (*) 70 - 99 mg/dL  GLUCOSE, CAPILLARY     Status: Abnormal   Collection Time    12/14/12  5:08 PM      Result Value Range   Glucose-Capillary 145 (*) 70 - 99 mg/dL   Comment 1 Notify RN    GLUCOSE, CAPILLARY     Status: Abnormal   Collection Time    12/14/12  9:54 PM      Result Value Range   Glucose-Capillary 192 (*) 70 - 99 mg/dL  BASIC METABOLIC PANEL     Status: Abnormal   Collection Time    12/15/12  5:50 AM      Result Value Range   Sodium 137  135 - 145 mEq/L   Potassium 3.2 (*) 3.5 - 5.1 mEq/L   Chloride 100  96 - 112 mEq/L   CO2 25  19 - 32 mEq/L   Glucose, Bld 152 (*) 70 - 99 mg/dL   BUN 12  6 - 23 mg/dL   Creatinine, Ser 1.61  0.50 - 1.35 mg/dL   Calcium 9.5  8.4 - 09.6 mg/dL   GFR calc non Af Amer >90  >90 mL/min   GFR calc Af Amer >90  >90 mL/min     Time spent in discharge (includes decision making & examination of pt): >30 minutes  12/15/2012, 10:21 AM  Lonia Blood, MD [facility] Office  [phone] Pager [phone]  On-Call/Text Page:      [email or web address]      password [facility]

## 2012-12-17 LAB — CULTURE, BLOOD (ROUTINE X 2): Culture: NO GROWTH

## 2012-12-17 LAB — GLUCOSE, CAPILLARY: Glucose-Capillary: 146 mg/dL — ABNORMAL HIGH (ref 70–99)

## 2012-12-19 ENCOUNTER — Ambulatory Visit: Payer: BC Managed Care – PPO | Admitting: Physical Therapy

## 2012-12-19 ENCOUNTER — Ambulatory Visit: Payer: BC Managed Care – PPO

## 2012-12-26 ENCOUNTER — Ambulatory Visit: Payer: BC Managed Care – PPO | Admitting: Physical Therapy

## 2013-01-02 ENCOUNTER — Ambulatory Visit: Payer: BC Managed Care – PPO | Admitting: Physical Therapy

## 2013-01-02 ENCOUNTER — Ambulatory Visit: Payer: BC Managed Care – PPO

## 2013-01-02 ENCOUNTER — Telehealth: Payer: Self-pay | Admitting: *Deleted

## 2013-01-02 DIAGNOSIS — G931 Anoxic brain damage, not elsewhere classified: Secondary | ICD-10-CM

## 2013-01-02 DIAGNOSIS — M216X9 Other acquired deformities of unspecified foot: Secondary | ICD-10-CM

## 2013-01-02 NOTE — Telephone Encounter (Signed)
Mr Risinger has appt in am with them.  Need the resume outpt PT ST orders from Dr Riley Kill.  Submitted.

## 2013-01-03 ENCOUNTER — Ambulatory Visit: Payer: BC Managed Care – PPO | Attending: Physical Medicine & Rehabilitation | Admitting: Physical Therapy

## 2013-01-03 ENCOUNTER — Ambulatory Visit: Payer: BC Managed Care – PPO

## 2013-01-03 DIAGNOSIS — R269 Unspecified abnormalities of gait and mobility: Secondary | ICD-10-CM | POA: Insufficient documentation

## 2013-01-03 DIAGNOSIS — R5381 Other malaise: Secondary | ICD-10-CM | POA: Insufficient documentation

## 2013-01-03 DIAGNOSIS — M6281 Muscle weakness (generalized): Secondary | ICD-10-CM | POA: Insufficient documentation

## 2013-01-03 DIAGNOSIS — IMO0001 Reserved for inherently not codable concepts without codable children: Secondary | ICD-10-CM | POA: Insufficient documentation

## 2013-01-04 ENCOUNTER — Ambulatory Visit: Payer: BC Managed Care – PPO | Admitting: Physical Therapy

## 2013-01-11 ENCOUNTER — Ambulatory Visit: Payer: BC Managed Care – PPO

## 2013-01-11 ENCOUNTER — Ambulatory Visit: Payer: BC Managed Care – PPO | Admitting: Physical Therapy

## 2013-01-15 ENCOUNTER — Ambulatory Visit: Payer: BC Managed Care – PPO | Admitting: Physical Therapy

## 2013-01-18 ENCOUNTER — Ambulatory Visit: Payer: BC Managed Care – PPO

## 2013-01-18 ENCOUNTER — Ambulatory Visit: Payer: BC Managed Care – PPO | Admitting: Physical Therapy

## 2013-01-23 ENCOUNTER — Ambulatory Visit: Payer: BC Managed Care – PPO | Admitting: Physical Therapy

## 2013-01-24 ENCOUNTER — Ambulatory Visit: Payer: BC Managed Care – PPO | Admitting: Physical Therapy

## 2013-01-25 ENCOUNTER — Ambulatory Visit: Payer: BC Managed Care – PPO

## 2013-01-25 ENCOUNTER — Ambulatory Visit: Payer: BC Managed Care – PPO | Admitting: Physical Therapy

## 2013-01-28 ENCOUNTER — Ambulatory Visit: Payer: BC Managed Care – PPO | Admitting: Physical Therapy

## 2013-01-30 ENCOUNTER — Ambulatory Visit: Payer: BC Managed Care – PPO | Admitting: Physical Therapy

## 2013-01-30 ENCOUNTER — Ambulatory Visit: Payer: BC Managed Care – PPO

## 2013-02-04 ENCOUNTER — Ambulatory Visit: Payer: BC Managed Care – PPO | Attending: Physical Medicine & Rehabilitation | Admitting: Physical Therapy

## 2013-02-04 DIAGNOSIS — R269 Unspecified abnormalities of gait and mobility: Secondary | ICD-10-CM | POA: Insufficient documentation

## 2013-02-04 DIAGNOSIS — IMO0001 Reserved for inherently not codable concepts without codable children: Secondary | ICD-10-CM | POA: Insufficient documentation

## 2013-02-04 DIAGNOSIS — R5381 Other malaise: Secondary | ICD-10-CM | POA: Insufficient documentation

## 2013-02-04 DIAGNOSIS — M6281 Muscle weakness (generalized): Secondary | ICD-10-CM | POA: Insufficient documentation

## 2013-02-06 ENCOUNTER — Ambulatory Visit: Payer: BC Managed Care – PPO | Admitting: Physical Therapy

## 2013-02-12 ENCOUNTER — Ambulatory Visit: Payer: BC Managed Care – PPO | Admitting: Physical Therapy

## 2013-02-14 ENCOUNTER — Ambulatory Visit: Payer: BC Managed Care – PPO | Admitting: Physical Therapy

## 2013-02-18 ENCOUNTER — Ambulatory Visit: Payer: BC Managed Care – PPO | Admitting: Physical Therapy

## 2013-02-20 ENCOUNTER — Ambulatory Visit: Payer: BC Managed Care – PPO | Admitting: Physical Therapy

## 2015-09-26 DIAGNOSIS — N179 Acute kidney failure, unspecified: Secondary | ICD-10-CM

## 2015-09-26 DIAGNOSIS — E1110 Type 2 diabetes mellitus with ketoacidosis without coma: Secondary | ICD-10-CM

## 2015-09-26 HISTORY — DX: Type 2 diabetes mellitus with ketoacidosis without coma: E11.10

## 2015-09-26 HISTORY — DX: Acute kidney failure, unspecified: N17.9

## 2015-10-06 ENCOUNTER — Emergency Department (HOSPITAL_COMMUNITY): Payer: Managed Care, Other (non HMO)

## 2015-10-06 ENCOUNTER — Encounter (HOSPITAL_COMMUNITY): Payer: Self-pay

## 2015-10-06 ENCOUNTER — Observation Stay (HOSPITAL_COMMUNITY)
Admission: EM | Admit: 2015-10-06 | Discharge: 2015-10-08 | Disposition: A | Discharge status: Home Health | Payer: Managed Care, Other (non HMO) | Attending: Internal Medicine | Admitting: Internal Medicine

## 2015-10-06 DIAGNOSIS — Z791 Long term (current) use of non-steroidal anti-inflammatories (NSAID): Secondary | ICD-10-CM | POA: Diagnosis not present

## 2015-10-06 DIAGNOSIS — E1042 Type 1 diabetes mellitus with diabetic polyneuropathy: Secondary | ICD-10-CM | POA: Insufficient documentation

## 2015-10-06 DIAGNOSIS — E1010 Type 1 diabetes mellitus with ketoacidosis without coma: Principal | ICD-10-CM | POA: Insufficient documentation

## 2015-10-06 DIAGNOSIS — Z7982 Long term (current) use of aspirin: Secondary | ICD-10-CM | POA: Diagnosis not present

## 2015-10-06 DIAGNOSIS — Z79899 Other long term (current) drug therapy: Secondary | ICD-10-CM | POA: Insufficient documentation

## 2015-10-06 DIAGNOSIS — G931 Anoxic brain damage, not elsewhere classified: Secondary | ICD-10-CM | POA: Diagnosis not present

## 2015-10-06 DIAGNOSIS — E875 Hyperkalemia: Secondary | ICD-10-CM | POA: Diagnosis not present

## 2015-10-06 DIAGNOSIS — R339 Retention of urine, unspecified: Secondary | ICD-10-CM | POA: Insufficient documentation

## 2015-10-06 DIAGNOSIS — E1310 Other specified diabetes mellitus with ketoacidosis without coma: Secondary | ICD-10-CM | POA: Diagnosis not present

## 2015-10-06 DIAGNOSIS — Z9989 Dependence on other enabling machines and devices: Secondary | ICD-10-CM

## 2015-10-06 DIAGNOSIS — Z794 Long term (current) use of insulin: Secondary | ICD-10-CM | POA: Insufficient documentation

## 2015-10-06 DIAGNOSIS — R197 Diarrhea, unspecified: Secondary | ICD-10-CM | POA: Insufficient documentation

## 2015-10-06 DIAGNOSIS — K219 Gastro-esophageal reflux disease without esophagitis: Secondary | ICD-10-CM | POA: Insufficient documentation

## 2015-10-06 DIAGNOSIS — N179 Acute kidney failure, unspecified: Secondary | ICD-10-CM | POA: Diagnosis not present

## 2015-10-06 DIAGNOSIS — E1110 Type 2 diabetes mellitus with ketoacidosis without coma: Secondary | ICD-10-CM

## 2015-10-06 DIAGNOSIS — R739 Hyperglycemia, unspecified: Secondary | ICD-10-CM | POA: Diagnosis present

## 2015-10-06 DIAGNOSIS — N289 Disorder of kidney and ureter, unspecified: Secondary | ICD-10-CM | POA: Diagnosis not present

## 2015-10-06 DIAGNOSIS — I10 Essential (primary) hypertension: Secondary | ICD-10-CM | POA: Diagnosis not present

## 2015-10-06 DIAGNOSIS — Z8673 Personal history of transient ischemic attack (TIA), and cerebral infarction without residual deficits: Secondary | ICD-10-CM | POA: Diagnosis not present

## 2015-10-06 DIAGNOSIS — E785 Hyperlipidemia, unspecified: Secondary | ICD-10-CM | POA: Diagnosis not present

## 2015-10-06 DIAGNOSIS — G4733 Obstructive sleep apnea (adult) (pediatric): Secondary | ICD-10-CM

## 2015-10-06 DIAGNOSIS — I159 Secondary hypertension, unspecified: Secondary | ICD-10-CM | POA: Diagnosis not present

## 2015-10-06 HISTORY — DX: Acute kidney failure, unspecified: N17.9

## 2015-10-06 HISTORY — DX: Type 2 diabetes mellitus with ketoacidosis without coma: E11.10

## 2015-10-06 LAB — BASIC METABOLIC PANEL
Anion gap: 26 — ABNORMAL HIGH (ref 5–15)
Anion gap: 27 — ABNORMAL HIGH (ref 5–15)
Anion gap: 21 — ABNORMAL HIGH (ref 5–15)
Anion gap: 9 (ref 5–15)
BUN: 42 mg/dL — AB (ref 6–20)
BUN: 44 mg/dL — AB (ref 6–20)
BUN: 45 mg/dL — AB (ref 6–20)
BUN: 40 mg/dL — AB (ref 6–20)
CALCIUM: 9.8 mg/dL (ref 8.9–10.3)
CALCIUM: 9 mg/dL (ref 8.9–10.3)
CALCIUM: 9.1 mg/dL (ref 8.9–10.3)
CHLORIDE: 95 mmol/L — AB (ref 101–111)
CHLORIDE: 94 mmol/L — AB (ref 101–111)
CO2: 11 mmol/L — ABNORMAL LOW (ref 22–32)
CO2: 11 mmol/L — ABNORMAL LOW (ref 22–32)
CO2: 11 mmol/L — AB (ref 22–32)
CO2: 23 mmol/L (ref 22–32)
CREATININE: 2.43 mg/dL — AB (ref 0.61–1.24)
CREATININE: 2.19 mg/dL — AB (ref 0.61–1.24)
CREATININE: 1.73 mg/dL — AB (ref 0.61–1.24)
Calcium: 9.9 mg/dL (ref 8.9–10.3)
Chloride: 105 mmol/L (ref 101–111)
Chloride: 108 mmol/L (ref 101–111)
Creatinine, Ser: 2.23 mg/dL — ABNORMAL HIGH (ref 0.61–1.24)
GFR calc Af Amer: 36 mL/min — ABNORMAL LOW (ref >60)
GFR calc Af Amer: 37 mL/min — ABNORMAL LOW (ref >60)
GFR calc Af Amer: 49 mL/min — ABNORMAL LOW (ref >60)
GFR calc non Af Amer: 31 mL/min — ABNORMAL LOW (ref >60)
GFR, EST AFRICAN AMERICAN: 32 mL/min — AB (ref >60)
GFR, EST NON AFRICAN AMERICAN: 28 mL/min — AB (ref >60)
GFR, EST NON AFRICAN AMERICAN: 32 mL/min — AB (ref >60)
GFR, EST NON AFRICAN AMERICAN: 42 mL/min — AB (ref >60)
GLUCOSE: 576 mg/dL — AB (ref 65–99)
GLUCOSE: 432 mg/dL — AB (ref 65–99)
GLUCOSE: 217 mg/dL — AB (ref 65–99)
Glucose, Bld: 626 mg/dL (ref 65–99)
POTASSIUM: 5.8 mmol/L — AB (ref 3.5–5.1)
Potassium: 6.2 mmol/L — ABNORMAL HIGH (ref 3.5–5.1)
Potassium: 4.4 mmol/L (ref 3.5–5.1)
Potassium: 3.8 mmol/L (ref 3.5–5.1)
SODIUM: 132 mmol/L — AB (ref 135–145)
Sodium: 132 mmol/L — ABNORMAL LOW (ref 135–145)
Sodium: 137 mmol/L (ref 135–145)
Sodium: 140 mmol/L (ref 135–145)

## 2015-10-06 LAB — CBG MONITORING, ED
GLUCOSE-CAPILLARY: 566 mg/dL — AB (ref 65–99)
GLUCOSE-CAPILLARY: 462 mg/dL — AB (ref 65–99)
Glucose-Capillary: 568 mg/dL (ref 65–99)

## 2015-10-06 LAB — URINE MICROSCOPIC-ADD ON
RBC / HPF: NONE SEEN RBC/hpf (ref 0–5)
WBC UA: NONE SEEN WBC/hpf (ref 0–5)

## 2015-10-06 LAB — URINALYSIS, ROUTINE W REFLEX MICROSCOPIC
BILIRUBIN URINE: NEGATIVE
Hgb urine dipstick: NEGATIVE
LEUKOCYTES UA: NEGATIVE
Nitrite: NEGATIVE
PH: 5 (ref 5.0–8.0)
Protein, ur: NEGATIVE mg/dL
Specific Gravity, Urine: 1.027 (ref 1.005–1.030)

## 2015-10-06 LAB — GLUCOSE, CAPILLARY
GLUCOSE-CAPILLARY: 339 mg/dL — AB (ref 65–99)
Glucose-Capillary: 134 mg/dL — ABNORMAL HIGH (ref 65–99)
Glucose-Capillary: 183 mg/dL — ABNORMAL HIGH (ref 65–99)
Glucose-Capillary: 233 mg/dL — ABNORMAL HIGH (ref 65–99)
Glucose-Capillary: 283 mg/dL — ABNORMAL HIGH (ref 65–99)
Glucose-Capillary: 296 mg/dL — ABNORMAL HIGH (ref 65–99)
Glucose-Capillary: 426 mg/dL — ABNORMAL HIGH (ref 65–99)

## 2015-10-06 LAB — CBC
HEMATOCRIT: 41.8 % (ref 39.0–52.0)
Hemoglobin: 13.9 g/dL (ref 13.0–17.0)
MCH: 33.1 pg (ref 26.0–34.0)
MCHC: 33.3 g/dL (ref 30.0–36.0)
MCV: 99.5 fL (ref 78.0–100.0)
Platelets: 254 10*3/uL (ref 150–400)
RBC: 4.2 MIL/uL — ABNORMAL LOW (ref 4.22–5.81)
RDW: 12.4 % (ref 11.5–15.5)
WBC: 20.2 10*3/uL — ABNORMAL HIGH (ref 4.0–10.5)

## 2015-10-06 LAB — I-STAT VENOUS BLOOD GAS, ED
Acid-base deficit: 17 mmol/L — ABNORMAL HIGH (ref 0.0–2.0)
Bicarbonate: 10.8 mEq/L — ABNORMAL LOW (ref 20.0–24.0)
O2 SAT: 94 %
PCO2 VEN: 30.8 mmHg — AB (ref 45.0–50.0)
PO2 VEN: 91 mmHg — AB (ref 31.0–45.0)
TCO2: 12 mmol/L (ref 0–100)
pH, Ven: 7.151 — CL (ref 7.250–7.300)

## 2015-10-06 LAB — MRSA PCR SCREENING: MRSA BY PCR: NEGATIVE

## 2015-10-06 MED ORDER — SODIUM BICARBONATE 8.4 % IV SOLN
100.0000 meq | Freq: Once | INTRAVENOUS | Status: AC
  Administered 2015-10-06: 100 meq via INTRAVENOUS
  Filled 2015-10-06: qty 100
  Filled 2015-10-06: qty 50

## 2015-10-06 MED ORDER — SODIUM CHLORIDE 0.9 % IV SOLN
INTRAVENOUS | Status: DC
  Administered 2015-10-06 – 2015-10-07 (×3): via INTRAVENOUS

## 2015-10-06 MED ORDER — ONDANSETRON HCL 4 MG PO TABS: 4.0000 mg | ORAL_TABLET | Freq: Four times a day (QID) | ORAL | Status: DC | PRN

## 2015-10-06 MED ORDER — SODIUM CHLORIDE 0.9 % IV SOLN
INTRAVENOUS | Status: DC
  Administered 2015-10-06: 5.4 [IU]/h via INTRAVENOUS
  Filled 2015-10-06: qty 2.5

## 2015-10-06 MED ORDER — HEPARIN SODIUM (PORCINE) 5000 UNIT/ML IJ SOLN
5000.0000 [IU] | Freq: Three times a day (TID) | INTRAMUSCULAR | Status: DC
Start: 2015-10-06 — End: 2015-10-08
  Administered 2015-10-06 – 2015-10-08 (×6): 5000 [IU] via SUBCUTANEOUS
  Filled 2015-10-06 (×6): qty 1

## 2015-10-06 MED ORDER — DEXTROSE-NACL 5-0.45 % IV SOLN
INTRAVENOUS | Status: DC
  Administered 2015-10-07: via INTRAVENOUS

## 2015-10-06 MED ORDER — ACETAMINOPHEN 650 MG RE SUPP: 650.0000 mg | Freq: Four times a day (QID) | RECTAL | Status: DC | PRN

## 2015-10-06 MED ORDER — SODIUM CHLORIDE 0.9 % IV BOLUS (SEPSIS)
1000.0000 mL | Freq: Once | INTRAVENOUS | Status: AC
  Administered 2015-10-06: 1000 mL via INTRAVENOUS

## 2015-10-06 MED ORDER — ONDANSETRON HCL 4 MG/2ML IJ SOLN
4.0000 mg | Freq: Once | INTRAMUSCULAR | Status: AC
  Administered 2015-10-06: 4 mg via INTRAVENOUS
  Filled 2015-10-06: qty 2

## 2015-10-06 MED ORDER — ESCITALOPRAM OXALATE 10 MG PO TABS
10.0000 mg | ORAL_TABLET | Freq: Every day | ORAL | Status: DC
  Filled 2015-10-06 (×2): qty 1

## 2015-10-06 MED ORDER — ACETAMINOPHEN 325 MG PO TABS: 650.0000 mg | ORAL_TABLET | Freq: Four times a day (QID) | ORAL | Status: DC | PRN

## 2015-10-06 MED ORDER — SODIUM CHLORIDE 0.9 % IV SOLN
INTRAVENOUS | Status: DC
  Administered 2015-10-06: 8 [IU]/h via INTRAVENOUS
  Administered 2015-10-06: 8.4 [IU]/h via INTRAVENOUS
  Administered 2015-10-06: 11 [IU]/h via INTRAVENOUS
  Administered 2015-10-06: 8.9 [IU]/h via INTRAVENOUS
  Filled 2015-10-06: qty 2.5

## 2015-10-06 MED ORDER — ONDANSETRON HCL 4 MG/2ML IJ SOLN
4.0000 mg | Freq: Four times a day (QID) | INTRAMUSCULAR | Status: DC | PRN
  Administered 2015-10-06 – 2015-10-08 (×5): 4 mg via INTRAVENOUS
  Filled 2015-10-06 (×5): qty 2

## 2015-10-06 MED ORDER — HYDROMORPHONE HCL 1 MG/ML IJ SOLN: 1.0000 mg | INTRAMUSCULAR | Status: DC | PRN

## 2015-10-06 MED ORDER — PANTOPRAZOLE SODIUM 40 MG PO TBEC
40.0000 mg | DELAYED_RELEASE_TABLET | Freq: Every day | ORAL | Status: DC
  Administered 2015-10-07 – 2015-10-08 (×2): 40 mg via ORAL
  Filled 2015-10-06 (×2): qty 1

## 2015-10-06 MED ORDER — SODIUM CHLORIDE 0.9 % IV SOLN: INTRAVENOUS | Status: DC

## 2015-10-06 MED ORDER — PRAVASTATIN SODIUM 40 MG PO TABS
40.0000 mg | ORAL_TABLET | Freq: Every day | ORAL | Status: DC
  Administered 2015-10-07 – 2015-10-08 (×2): 40 mg via ORAL
  Filled 2015-10-06 (×2): qty 1

## 2015-10-06 NOTE — Progress Notes (Signed)
4pm BMET was not drawn for unclear reasons. I have spoken with phlebotomy / lab and it will be collected now, stat.

## 2015-10-06 NOTE — Progress Notes (Signed)
10/06/2015 patient transfer from to Emergency room to 2 central at 1620. He is alert, sleepy by arousable, oriented and normally ambulatory, but he fell couple times at home. Patient have scab on right knee, left knee discolored,right foot third toe have a sore are. Missing the top of great toe and toe next to great toe is missing. Sacrum pink, but blanches. [facility]ad[facility] Venesha Petraitis RN.

## 2015-10-06 NOTE — Care Management Note (Signed)
Case Management Note  Patient Details  Name: Thomas Frye MRN: [id number] Date of Birth: 07/26/1955  Subjective/Objective:                  Patient here with hyperglycemia and vomiting since yesterday, patient appears sleepy on arrival but will open eyes and answer questions with verbal stimuli, complains of weakness with same./ From home with spouse.      Action/Plan: Follow for disposition needs.   Expected Discharge Date:  10/08/15               Expected Discharge Plan:  Home/Self Care  In-House Referral:     Discharge planning Services  CM Consult  Post Acute Care Choice:    Choice offered to:     DME Arranged:    DME Agency:     HH Arranged:    HH Agency:     Status of Service:  In process, will continue to follow  If discussed at Long Length of Stay Meetings, dates discussed:    Additional Comments:  Thomas Frye, Thomas Repetto, RN 10/06/2015, 2:12 PM

## 2015-10-06 NOTE — ED Notes (Signed)
Patient here with hyperglycemia and vomiting since yesterday, patient appears sleepy on arrival but will open eyes and answer questions with verbal stimuli, complains of weakness with same

## 2015-10-06 NOTE — ED Notes (Addendum)
Checked patient cbg it was [id number] notified RN Thomas Frye of blood sugar

## 2015-10-06 NOTE — H&P (Signed)
History and Physical    Thomas Frye QMV:[id number] DOB: 1955-06-01 DOA: 10/06/2015  PCP: Marylou Flesher, MD  Patient coming from:  Home    Chief Complaint:  Malaise, elevated glucose  HPI: Thomas Frye is a 60 y.o. male with medical history significant for DM, depression with suicide attempts, history of anoxic brain injury secondary to severe hypoglycemic episode in 2014. CT scan at the time showed subacute or chronic infarcts right thalamus.  Patient has right foot drop, wears brace  Patient takes insulin twice daily and NovoLog as needed. On Thursday he ran out of Lantus but has been using the NovoLog over the last few days. He is scared to take too much insulin for fear of hypoglycemia given past events . Patient developed nausea and malaise this morning, family brought him to the emergency department he was found to be in DKA.   Patient has chronic diarrhea. He is on pancreatic enzymes, takes dose of Flagyl every 3 days and takes daily antidiarrheals. His PCP is located at [facility].. Patient has been intentionally trying to lose weight through diet and exercise so insulin dose could be reduced  ED Course:  Afebrile, mildly tachycardic 105, vital signs otherwise stable. ABG markedly abnormal with pH 7.15, bicarbonate 10.8,  PCO2 30.8, PO2 91 Anion gap 27 Potassium 6.2, bicarbonate 11, BUN 44, creatinine 2.43,  glucose 626 EKG sinus tachycardia, nonspecific intraventricular conduction the leg, no significant change since last tracing. QTC 501. Vent rate 105 Chronic interstitial markings on chest x-ray WBC 20 Receiving first bolus of fluid now, second one ordered. EDP started Glucomander    Review of Systems: As per HPI, otherwise 10 point review of systems negative.   Past Medical History  Diagnosis Date  . Diabetes mellitus   . Depression   . GERD (gastroesophageal reflux disease)   . H/O hiatal hernia   . Neuromuscular disorder (HCC)     neuropathy  . Sleep apnea       wears cpap at night  . 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.2) 10/05/2012  . Left foot drop 10/05/2012  . Gait disturbance   . Cerebrovascular disease     Right thalamic, left parietal white matter strokes  . Sciatic neuropathy     Left-sided nerve  . Diabetic peripheral neuropathy [facility])     Past Surgical History  Procedure Laterality Date  . Toe amputation  2008  . Eye surgery    . Fracture surgery      Questionable steel screws to right tibia    Social History   Social History  . Marital Status: Married    Spouse Name: N/A  . Number of Children: 4  . Years of Education: 16   Occupational History  . SALES    Social History Main Topics  . Smoking status: Never Smoker   . Smokeless tobacco: Not on file  . Alcohol Use: 0.6 oz/week    1 Glasses of wine per week     Comment: Rare occasions  . Drug Use: No  . Sexual Activity: Yes    Birth Control/ Protection: Surgical   Other Topics Concern  . Not on file   Social History Narrative   Patient is married, lives at home. He has a leg brace for foot drop, uses cane as needed Allergies  Allergen Reactions  . Latex Swelling  . Sulfa Antibiotics Hives    Family History  Problem Relation Age of Onset  . Breast cancer Mother   . Melanoma Mother   .  Diabetes Mother   . Diabetes Brother   . Diabetes Maternal Grandmother     Prior to Admission medications   Medication Sig Start Date End Date Taking? Authorizing Provider  aspirin EC 81 MG EC tablet Take 1 tablet (81 mg total) by mouth daily. 08/29/12   Sorin Luanne Bras, MD  escitalopram (LEXAPRO) 10 MG tablet Take 10 mg by mouth daily.    Historical Provider, MD  insulin aspart (NOVOLOG) 100 UNIT/ML injection CHECK YOUR CBG 3 TIMES A DAY WITH MEALS AND USE A SLIDING SCALE AS FOLLOWS: For CBG 121-150 inject 1unit of insulin For CBG 151-200 inject 2units of insulin For CBG 201-250 inject 3units of insulin  For CBG 251-300 inject 5units of insulin For CBG 301-350 inject 7units  of insulin For CBG 351-400 inject 9units of insulin For CBG >400 call your MD 12/15/12   Lonia Blood, MD  insulin glargine (LANTUS) 100 UNIT/ML injection Inject 0.75 mLs (75 Units total) into the skin at bedtime. 12/15/12   Lonia Blood, MD  loratadine (CLARITIN) 10 MG tablet Take 10 mg by mouth daily.    Historical Provider, MD  meloxicam (MOBIC) 15 MG tablet Take 15 mg by mouth daily.    Historical Provider, MD  omeprazole (PRILOSEC) 40 MG capsule Take 40 mg by mouth 2 (two) times daily.    Historical Provider, MD  pravastatin (PRAVACHOL) 40 MG tablet Take 40 mg by mouth daily.    Historical Provider, MD  Probiotic Product (PROBIOTIC PO) Take 1 tablet by mouth daily.    Historical Provider, MD    Physical Exam: Filed Vitals:   10/06/15 1051 10/06/15 1139 10/06/15 1300  BP: 128/58 134/55 125/56  Pulse: 98 98 105  Temp: 98.3 F (36.8 C) 98 F (36.7 C)   TempSrc: Oral Oral   Resp: 22 23 20   Height: 6\' 4"  (1.93 m)    SpO2: 97% 97% 98%    Constitutional:  NAD, calm, comfortable Filed Vitals:   10/06/15 1051 10/06/15 1139 10/06/15 1300  BP: 128/58 134/55 125/56  Pulse: 98 98 105  Temp: 98.3 F (36.8 C) 98 F (36.7 C)   TempSrc: Oral Oral   Resp: 22 23 20   Height: 6\' 4"  (1.93 m)    SpO2: 97% 97% 98%   Eyes: Pupils pinpoint, sluggish reaction to light. Lids and conjunctivae normal ENMT: Mucous membranes are moist. Posterior pharynx clear of any exudate or lesions..  Neck: normal, supple, no masses Respiratory: clear to auscultation bilaterally, no wheezing, no crackles. Normal respiratory effort. No accessory muscle use.  Cardiovascular: sinus tachycardia. No / rubs / gallops. No extremity edema. 2+ pedal pulses.   Abdomen: no tenderness, no masses palpated. No hepatomegaly. Bowel sounds positive.  Musculoskeletal: no clubbing / cyanosis. No joint deformity upper and lower extremities. Good ROM, no contractures. Normal muscle tone.  Skin: no rashes, lesions, ulcers.    Neurologic: Slow speech. Alert and oriented. Answers questions appropriately.  Psychiatric: Normal judgment and insight. Alert and oriented x 3. Normal mood.   Labs on Admission: I have personally reviewed following labs and imaging studies   Urine analysis:    Component Value Date/Time   COLORURINE YELLOW 12/11/2012 0704   APPEARANCEUR CLEAR 12/11/2012 0704   LABSPEC 1.021 12/11/2012 0704   PHURINE 5.0 12/11/2012 0704   GLUCOSEU >1000* 12/11/2012 0704   HGBUR NEGATIVE 12/11/2012 0704   BILIRUBINUR NEGATIVE 12/11/2012 0704   KETONESUR >80* 12/11/2012 0704   PROTEINUR NEGATIVE 12/11/2012 0704   UROBILINOGEN 0.2  12/11/2012 0704   NITRITE NEGATIVE 12/11/2012 0704   LEUKOCYTESUR NEGATIVE 12/11/2012 0704    Radiological Exams on Admission: Dg Chest Portable 1 View  10/06/2015  CLINICAL DATA:  60 year old male with a history of hyperglycemia, weakness, vomiting EXAM: PORTABLE CHEST 1 VIEW COMPARISON:  12/09/2012 FINDINGS: Cardiomediastinal silhouette unchanged. No evidence of central vascular congestion. No interlobular septal thickening. No confluent airspace disease or pneumothorax.  No pleural effusion. Coarse interstitial markings similar prior. No displaced fracture. IMPRESSION: Chronic lung changes without evidence of superimposed acute cardiopulmonary disease. Signed, Yvone NeuJaime S. Loreta AveWagner, DO [facility] Electronically Signed   By: Gilmer MorJaime  Wagner D.O.   On: 10/06/2015 12:27    EKG: Independently reviewed.   EKG Interpretation  Date/Time:  Tuesday October 06 2015 13:02:56 EDT Ventricular Rate:  105 PR Interval:    QRS Duration: 122 QT Interval:  379 QTC Calculation: 501 R Axis:   42 Text Interpretation:  Sinus tachycardia Probable left atrial enlargement Nonspecific intraventricular conduction delay No significant change since last tracing Confirmed by KNAPP  MD-J, JON ([id number]) on 10/06/2015 1:06:29 PM        Assessment/Plan    DKA. Precipitating event:  Ran out of basal insulin 5 days ago. Using short acting insulin but fearful of taking too much given history of severe hypoglycemia.  -admit to Stepdown -continue with 2 liter IVF bolus ordered in ED then start 125ml / hr  -continue Glucomander -anti-emetics for N/V  ARF.  Creatinine 2.43, it was normal at time of last admission 2014. Should improve with volume repletion -Avoid nephrotoxic medications -IV fluid resuscitation -Monitoring BMETs  Hyperkalemia in setting of volume depletion / ARF / metabolic acidosis. .Should improve with IVF and insulin -Q 4 bmet -monitor on tele, no EKG changes at present -2 amps of bicarb ordered  History of anoxic brain injury secondary to severe hypoglycemia.  OSA.  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  -Resume pancreatic enzymes when tolerating meals -Resume Flagyl every third day (home regimen) when tolerating by mouth -Resume antidiarrheals when tolerating by mouth                                  DVT prophylaxis:   SQ heparin Code Status:   Full code  Family Communication:   Updated wife in room She agrees with plan of treatment .  Disposition Plan:  Discharge home in 2-3 days              Consults called:   None Admission status:  Admit to stepdown    Willette ClusterPaula Mariselda Badalamenti NP [facility] Pager [phone]  If 7PM-7AM, please contact night-coverage [email or web address] Password [email or web address]  10/06/2015, 1:46 PM

## 2015-10-06 NOTE — ED Notes (Signed)
Pt's CBG result: 462, informed Carla-RN.

## 2015-10-06 NOTE — ED Provider Notes (Signed)
CSN: [id number]     Arrival date & time 10/06/15  1039 History   First MD Initiated Contact with Patient 10/06/15 1115     Chief Complaint  Patient presents with  . Hyperglycemia  . Emesis    HPI Pt states yesterday he started to feeling poorly.  He had general malaise.  The sugars were elevated yesterday but he was not able to eat much.  Pt has history of diabetes.  He is concerned that he might be going into DKA.   Today he started having vomiting.  NO fevers.  NO pain but he does not feel good.  He also has had some diarrhea.  Pt has been listless today.  He called his endocrinologist (Dr March Rummage) and was told to come to the ED.   Pt ran out of his lantus on Thursday.    Past Medical History  Diagnosis Date  . Diabetes mellitus   . Depression   . GERD (gastroesophageal reflux disease)   . H/O hiatal hernia   . Neuromuscular disorder (HCC)     neuropathy  . Sleep apnea     wears cpap at night  . 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.2) 10/05/2012  . Left foot drop 10/05/2012  . Gait disturbance   . Cerebrovascular disease     Right thalamic, left parietal white matter strokes  . Sciatic neuropathy     Left-sided nerve  . Diabetic peripheral neuropathy [facility])    Past Surgical History  Procedure Laterality Date  . Toe amputation  2008  . Eye surgery    . Fracture surgery      Questionable steel screws to right tibia   Family History  Problem Relation Age of Onset  . Breast cancer Mother   . Melanoma Mother   . Diabetes Mother   . Diabetes Brother   . Diabetes Maternal Grandmother    Social History  Substance Use Topics  . Smoking status: Never Smoker   . Smokeless tobacco: None  . Alcohol Use: 0.6 oz/week    1 Glasses of wine per week     Comment: Rare occasions    Review of Systems  All other systems reviewed and are negative.     Allergies  Latex and Sulfa antibiotics  Home Medications   Prior to Admission medications   Medication Sig Start Date End Date  Taking? Authorizing Provider  aspirin EC 81 MG EC tablet Take 1 tablet (81 mg total) by mouth daily. 08/29/12   Sorin Luanne Bras, MD  escitalopram (LEXAPRO) 10 MG tablet Take 10 mg by mouth daily.    Historical Provider, MD  insulin aspart (NOVOLOG) 100 UNIT/ML injection CHECK YOUR CBG 3 TIMES A DAY WITH MEALS AND USE A SLIDING SCALE AS FOLLOWS: For CBG 121-150 inject 1unit of insulin For CBG 151-200 inject 2units of insulin For CBG 201-250 inject 3units of insulin  For CBG 251-300 inject 5units of insulin For CBG 301-350 inject 7units of insulin For CBG 351-400 inject 9units of insulin For CBG >400 call your MD 12/15/12   Lonia Blood, MD  insulin glargine (LANTUS) 100 UNIT/ML injection Inject 0.75 mLs (75 Units total) into the skin at bedtime. 12/15/12   Lonia Blood, MD  loratadine (CLARITIN) 10 MG tablet Take 10 mg by mouth daily.    Historical Provider, MD  meloxicam (MOBIC) 15 MG tablet Take 15 mg by mouth daily.    Historical Provider, MD  omeprazole (PRILOSEC) 40 MG capsule Take 40 mg by mouth  2 (two) times daily.    Historical Provider, MD  pravastatin (PRAVACHOL) 40 MG tablet Take 40 mg by mouth daily.    Historical Provider, MD  Probiotic Product (PROBIOTIC PO) Take 1 tablet by mouth daily.    Historical Provider, MD   BP 134/55 mmHg  Pulse 98  Temp(Src) 98 F (36.7 C) (Oral)  Resp 23  Ht 6\' 4"  (1.93 m)  SpO2 97% Physical Exam  Constitutional: He appears well-developed and well-nourished. He appears listless.  HENT:  Head: Normocephalic and atraumatic.  Right Ear: External ear normal.  Left Ear: External ear normal.  Mouth/Throat: No oropharyngeal exudate (mucous membranes are dry).  Eyes: Conjunctivae are normal. Right eye exhibits no discharge. Left eye exhibits no discharge. No scleral icterus.  Neck: Neck supple. No tracheal deviation present.  Cardiovascular: Normal rate, regular rhythm and intact distal pulses.   Pulmonary/Chest: Effort normal and breath sounds  normal. No stridor. No respiratory distress. He has no wheezes. He has no rales.  Abdominal: Soft. Bowel sounds are normal. He exhibits no distension. There is no tenderness. There is no rebound and no guarding.  Musculoskeletal: He exhibits no edema or tenderness.  Neurological: He has normal strength. He appears listless. No cranial nerve deficit (no facial droop, extraocular movements intact, no slurred speech) or sensory deficit. He exhibits normal muscle tone. He displays no seizure activity. Coordination normal.  No focal neurologic deficits, patient is following commands  Skin: Skin is warm and dry. No rash noted. He is not diaphoretic.  Psychiatric: He has a normal mood and affect. He is slowed.  Nursing note and vitals reviewed.   ED Course  .Critical Care Performed by: Linwood DibblesKNAPP, Tamala Manzer Authorized by: Linwood DibblesKNAPP, Akeya Ryther Total critical care time: 40 minutes Critical care was necessary to treat or prevent imminent or life-threatening deterioration of the following conditions: endocrine crisis. Critical care was time spent personally by me on the following activities: development of treatment plan with patient or surrogate, discussions with consultants, evaluation of patient's response to treatment, examination of patient, obtaining history from patient or surrogate, ordering and performing treatments and interventions, ordering and review of laboratory studies, ordering and review of radiographic studies, pulse oximetry and re-evaluation of patient's condition.   (including critical care time) Labs Review Labs Reviewed  BASIC METABOLIC PANEL - Abnormal; Notable for the following:    Sodium 132 (*)    Potassium 5.8 (*)    Chloride 95 (*)    CO2 11 (*)    Glucose, Bld 576 (*)    BUN 42 (*)    Creatinine, Ser 2.23 (*)    GFR calc non Af Amer 31 (*)    GFR calc Af Amer 36 (*)    Anion gap 26 (*)    All other components within normal limits  CBC - Abnormal; Notable for the following:    WBC  20.2 (*)    RBC 4.20 (*)    All other components within normal limits  CBG MONITORING, ED - Abnormal; Notable for the following:    Glucose-Capillary 568 (*)    All other components within normal limits  URINALYSIS, ROUTINE W REFLEX MICROSCOPIC (NOT AT [facility])  BASIC METABOLIC PANEL  I-STAT VENOUS BLOOD GAS, ED    Imaging Review Dg Chest Portable 1 View  10/06/2015  CLINICAL DATA:  60 year old male with a history of hyperglycemia, weakness, vomiting EXAM: PORTABLE CHEST 1 VIEW COMPARISON:  12/09/2012 FINDINGS: Cardiomediastinal silhouette unchanged. No evidence of central vascular congestion. No interlobular septal  thickening. No confluent airspace disease or pneumothorax.  No pleural effusion. Coarse interstitial markings similar prior. No displaced fracture. IMPRESSION: Chronic lung changes without evidence of superimposed acute cardiopulmonary disease. Signed, Yvone Neu. Loreta Ave, DO [facility] Radiology Electronically Signed   By: Gilmer Mor D.O.   On: 10/06/2015 12:27   I have personally reviewed and evaluated these images and lab results as part of my medical decision-making.   EKG Interpretation   Date/Time:  Tuesday October 06 2015 13:02:56 EDT Ventricular Rate:  105 PR Interval:    QRS Duration: 122 QT Interval:  379 QTC Calculation: 501 R Axis:   42 Text Interpretation:  Sinus tachycardia Probable left atrial enlargement  Nonspecific intraventricular conduction delay No significant change since  last tracing Confirmed by Dub Maclellan  MD-J, Jacalynn Buzzell ([id number]) on 10/06/2015 1:06:29 PM      Medications given in the ED Medications  insulin regular (NOVOLIN R,HUMULIN R) 250 Units in sodium chloride 0.9 % 250 mL (1 Units/mL) infusion (not administered)  sodium chloride 0.9 % bolus 1,000 mL (not administered)    And  sodium chloride 0.9 % bolus 1,000 mL (not administered)    And  0.9 %  sodium chloride infusion (not administered)     MDM    Final diagnoses:  Diabetic ketoacidosis without coma associated with type 1 diabetes mellitus (HCC)  Acute renal insufficiency    1200Patient's initial labs are notable for an elevated white blood cell count and a basic metabolic panel suggestive of diabetic ketoacidosis. Patient has acute renal sufficiency associated with hyperglycemia and a low bicarbonate. His potassium is elevated. Plan on IV fluid hydration. I ordered an insulin infusion that should help with the hyperglycemia, DKA in his potassium. We will need to monitor his electrolyte panel closely with serial i-STAT's or BASIC metabolic panels.  1307  Insulin infusion was not started yet.   Repeat BMET is just after fluid infusion.  Blood sugar is increased.  K and renal function is worse.  EKG without significant changes.  I asked for the insulin to be started ASAP.  Will admit to stepdown unit.  Linwood Dibbles, MD 10/07/15 [phone]

## 2015-10-07 ENCOUNTER — Encounter (HOSPITAL_COMMUNITY): Payer: Self-pay | Admitting: General Practice

## 2015-10-07 DIAGNOSIS — G4733 Obstructive sleep apnea (adult) (pediatric): Secondary | ICD-10-CM | POA: Diagnosis not present

## 2015-10-07 DIAGNOSIS — N179 Acute kidney failure, unspecified: Secondary | ICD-10-CM | POA: Diagnosis not present

## 2015-10-07 DIAGNOSIS — N289 Disorder of kidney and ureter, unspecified: Secondary | ICD-10-CM | POA: Diagnosis not present

## 2015-10-07 DIAGNOSIS — I159 Secondary hypertension, unspecified: Secondary | ICD-10-CM

## 2015-10-07 DIAGNOSIS — E1310 Other specified diabetes mellitus with ketoacidosis without coma: Secondary | ICD-10-CM

## 2015-10-07 LAB — BASIC METABOLIC PANEL
ANION GAP: 7 (ref 5–15)
ANION GAP: 6 (ref 5–15)
BUN: 33 mg/dL — ABNORMAL HIGH (ref 6–20)
BUN: 38 mg/dL — ABNORMAL HIGH (ref 6–20)
CALCIUM: 8.9 mg/dL (ref 8.9–10.3)
CHLORIDE: 113 mmol/L — AB (ref 101–111)
CHLORIDE: 112 mmol/L — AB (ref 101–111)
CO2: 22 mmol/L (ref 22–32)
CO2: 24 mmol/L (ref 22–32)
Calcium: 8.8 mg/dL — ABNORMAL LOW (ref 8.9–10.3)
Creatinine, Ser: 1.3 mg/dL — ABNORMAL HIGH (ref 0.61–1.24)
Creatinine, Ser: 1.5 mg/dL — ABNORMAL HIGH (ref 0.61–1.24)
GFR calc non Af Amer: 50 mL/min — ABNORMAL LOW (ref >60)
GFR calc non Af Amer: 59 mL/min — ABNORMAL LOW (ref >60)
GFR, EST AFRICAN AMERICAN: 58 mL/min — AB (ref >60)
GLUCOSE: 172 mg/dL — AB (ref 65–99)
GLUCOSE: 160 mg/dL — AB (ref 65–99)
POTASSIUM: 4 mmol/L (ref 3.5–5.1)
Potassium: 3.9 mmol/L (ref 3.5–5.1)
Sodium: 142 mmol/L (ref 135–145)
Sodium: 142 mmol/L (ref 135–145)

## 2015-10-07 LAB — GLUCOSE, CAPILLARY
GLUCOSE-CAPILLARY: 128 mg/dL — AB (ref 65–99)
GLUCOSE-CAPILLARY: 145 mg/dL — AB (ref 65–99)
GLUCOSE-CAPILLARY: 156 mg/dL — AB (ref 65–99)
GLUCOSE-CAPILLARY: 183 mg/dL — AB (ref 65–99)
GLUCOSE-CAPILLARY: 207 mg/dL — AB (ref 65–99)
Glucose-Capillary: 136 mg/dL — ABNORMAL HIGH (ref 65–99)
Glucose-Capillary: 222 mg/dL — ABNORMAL HIGH (ref 65–99)
Glucose-Capillary: 225 mg/dL — ABNORMAL HIGH (ref 65–99)
Glucose-Capillary: 236 mg/dL — ABNORMAL HIGH (ref 65–99)

## 2015-10-07 LAB — CBC
HCT: 36.1 % — ABNORMAL LOW (ref 39.0–52.0)
HEMOGLOBIN: 12 g/dL — AB (ref 13.0–17.0)
MCH: 31.7 pg (ref 26.0–34.0)
MCHC: 33.2 g/dL (ref 30.0–36.0)
MCV: 95.5 fL (ref 78.0–100.0)
PLATELETS: 230 10*3/uL (ref 150–400)
RBC: 3.78 MIL/uL — AB (ref 4.22–5.81)
RDW: 12.5 % (ref 11.5–15.5)
WBC: 16.1 10*3/uL — ABNORMAL HIGH (ref 4.0–10.5)

## 2015-10-07 LAB — HEMOGLOBIN A1C
HEMOGLOBIN A1C: 8.3 % — AB (ref 4.8–5.6)
Mean Plasma Glucose: 192 mg/dL

## 2015-10-07 MED ORDER — PANCRELIPASE (LIP-PROT-AMYL) 5000 UNITS PO CPEP: ORAL_CAPSULE | ORAL | Status: DC

## 2015-10-07 MED ORDER — METOCLOPRAMIDE HCL 5 MG/ML IJ SOLN
5.0000 mg | Freq: Four times a day (QID) | INTRAMUSCULAR | Status: DC
  Administered 2015-10-07 – 2015-10-08 (×4): 5 mg via INTRAVENOUS
  Filled 2015-10-07 (×4): qty 2

## 2015-10-07 MED ORDER — INSULIN GLARGINE 100 UNIT/ML ~~LOC~~ SOLN
40.0000 [IU] | Freq: Two times a day (BID) | SUBCUTANEOUS | Status: DC
  Administered 2015-10-07 – 2015-10-08 (×3): 40 [IU] via SUBCUTANEOUS
  Filled 2015-10-07 (×4): qty 0.4

## 2015-10-07 MED ORDER — PANCRELIPASE (LIP-PROT-AMYL) 5000 UNITS PO CPEP: 20000.0000 [IU] | ORAL_CAPSULE | ORAL | Status: DC

## 2015-10-07 MED ORDER — INSULIN ASPART 100 UNIT/ML ~~LOC~~ SOLN
0.0000 [IU] | Freq: Three times a day (TID) | SUBCUTANEOUS | Status: DC
  Administered 2015-10-07 (×3): 5 [IU] via SUBCUTANEOUS
  Administered 2015-10-08: 3 [IU] via SUBCUTANEOUS
  Administered 2015-10-08: 5 [IU] via SUBCUTANEOUS

## 2015-10-07 MED ORDER — INSULIN ASPART 100 UNIT/ML ~~LOC~~ SOLN
0.0000 [IU] | Freq: Every day | SUBCUTANEOUS | Status: DC
  Administered 2015-10-07: 2 [IU] via SUBCUTANEOUS

## 2015-10-07 MED ORDER — PANCRELIPASE (LIP-PROT-AMYL) 20000-68000 UNITS PO CPEP
3.0000 | ORAL_CAPSULE | Freq: Three times a day (TID) | ORAL | Status: DC
  Administered 2015-10-07 – 2015-10-08 (×4): 60000 [IU] via ORAL
  Filled 2015-10-07 (×4): qty 3

## 2015-10-07 MED ORDER — DOXAZOSIN MESYLATE 1 MG PO TABS
0.5000 mg | ORAL_TABLET | Freq: Every day | ORAL | Status: DC
  Administered 2015-10-07 – 2015-10-08 (×2): 0.5 mg via ORAL
  Filled 2015-10-07 (×2): qty 0.5

## 2015-10-07 MED ORDER — PANCRELIPASE (LIP-PROT-AMYL) 20000-68000 UNITS PO CPEP
2.0000 | ORAL_CAPSULE | ORAL | Status: DC
  Filled 2015-10-07 (×5): qty 2

## 2015-10-07 NOTE — Evaluation (Signed)
Physical Therapy Evaluation Patient Details Name: Thomas CottonJohnny O Spranger MRN: [id number] DOB: October 24, 1955 Today's Date: 10/07/2015   History of Present Illness  Thomas Frye is a 60 y.o. male with a Past Medical History of diabetes, depression, GERD, neuropathy, sleep apnea, left foot drop, CVA, sciatica who presents with DKA acute kidney injury and hyperkalemia.  Clinical Impression  Patient presents with decreased independence and safety with mobility and will benefit from skilled PT in the acute setting to address issues noted in PT problem list and allow d/c home with family support and follow up HHPT.  Wife and pt relate several falls and working with son on moving to safer home environment.      Follow Up Recommendations Home health PT (for home safety, review HEP)    Equipment Recommendations  None recommended by PT    Recommendations for Other Services       Precautions / Restrictions Precautions Precautions: Fall Required Braces or Orthoses: Other Brace/Splint Other Brace/Splint: R AFO for foot drop      Mobility  Bed Mobility Overal bed mobility: Needs Assistance Bed Mobility: Supine to Sit;Sit to Supine     Supine to sit: Supervision Sit to supine: Supervision   General bed mobility comments: increased time and assist for lines/safety  Transfers Overall transfer level: Needs assistance Equipment used: Straight cane Transfers: Sit to/from Stand Sit to Stand: Min assist         General transfer comment: initiates independently, but posterior LOB in standing with assist for balance  Ambulation/Gait Ambulation/Gait assistance: Min assist Ambulation Distance (Feet): 130 Feet Assistive device: Straight cane;None Gait Pattern/deviations: Step-through pattern;Decreased stride length;Trunk flexed;Wide base of support     General Gait Details: tenuous balance with pt initiating with cane, then carrying it most of the way.   LOB x 2 with min A recovery due to  staggering to L at one point and due to posterior lean when cued to hold wall rail so I could tie his shoe; also needed min A for turning and increased time  Stairs            Wheelchair Mobility    Modified Rankin (Stroke Patients Only)       Balance Overall balance assessment: Needs assistance   Sitting balance-Leahy Scale: Good Sitting balance - Comments: seated for donning shoes without LOB   Standing balance support: During functional activity;No upper extremity supported Standing balance-Leahy Scale: Poor Standing balance comment: stays crouched when doff/donning shorts to use bathroom for lowering COG, using many balance compensatory strategies and trying not to use assistive devices                              Pertinent Vitals/Pain Pain Assessment: No/denies pain    Home Living Family/patient expects to be discharged to:: Private residence Living Arrangements: Spouse/significant other;Children Available Help at Discharge: Family;Available 24 hours/day Type of Home: House Home Access: Stairs to enter Entrance Stairs-Rails: Left Entrance Stairs-Number of Steps: 4 with rails (one step deeper,) 2 closer to car parking area no rails Home Layout: One level Home Equipment: Walker - 2 wheels;Cane - single point;Tub bench;Other (comment) (AFO)      Prior Function Level of Independence: Independent with assistive device(s)         Comments: wife reports they have both fallen on the stairs at home entry due to no rails,  states her son is looking for home with more accessible entry (they  have been living with him since pt out of work and not yet able to get disability)     Hand Dominance        Extremity/Trunk Assessment   Upper Extremity Assessment: RUE deficits/detail;LUE deficits/detail RUE Deficits / Details: slower and more taxing to don shoes evidence of neuropathy, but AROM and strength grossly WFL   RUE Sensation: history of peripheral  neuropathy LUE Deficits / Details: slower and more taxing to don shoes evidence of neuropathy, but AROM and strength grossly WFL   Lower Extremity Assessment: RLE deficits/detail;LLE deficits/detail RLE Deficits / Details: WFL except h/o foot drop LLE Deficits / Details: overall WFL     Communication   Communication: Expressive difficulties (whispers)  Cognition Arousal/Alertness: Awake/alert Behavior During Therapy: Flat affect Overall Cognitive Status: History of cognitive impairments - at baseline Area of Impairment: Safety/judgement;Attention;Problem solving   Current Attention Level: Selective Memory: Decreased short-term memory   Safety/Judgement: Decreased awareness of safety;Decreased awareness of deficits   Problem Solving: Slow processing General Comments: h/o anoxic brain injury due to DKA    General Comments General comments (skin integrity, edema, etc.): Wife reports she wishes he would use the walker more, but he won't and would only sparingly use the cane during this session.  Reports he has undergone both CIR and outpatient rehab.  Denies current needs for follow up PT [facility].  Discussed with pt and wife why water exercise is good for some things, but not for work on balance.  Encouraged him to find HEP from previous rehab visits to practice at home and would return to practice during acute stay.     Exercises        Assessment/Plan    PT Assessment Patient needs continued PT services  PT Diagnosis Abnormality of gait;Generalized weakness   PT Problem List Decreased activity tolerance;Decreased balance;Decreased mobility;Decreased safety awareness;Decreased knowledge of use of DME;Impaired sensation  PT Treatment Interventions DME instruction;Gait training;Stair training;Functional mobility training;Patient/family education;Therapeutic activities;Therapeutic exercise;Modalities   PT Goals (Current goals can be  found in the Care Plan section) Acute Rehab PT Goals Patient Stated Goal: To go home PT Goal Formulation: With patient/family Time For Goal Achievement: 10/10/15 Potential to Achieve Goals: Good    Frequency Min 3X/week   Barriers to discharge        Co-evaluation               End of Session Equipment Utilized During Treatment: Gait belt Activity Tolerance: Patient limited by fatigue Patient left: with call bell/phone within reach;in bed;with family/visitor present      Functional Assessment Tool Used: Clinical Judgement Functional Limitation: Mobility: Walking and moving around Mobility: Walking and Moving Around Current Status (Z6109): At least 20 percent but less than 40 percent impaired, limited or restricted Mobility: Walking and Moving Around Goal Status [phone]): At least 1 percent but less than 20 percent impaired, limited or restricted    Time: 1605-1630 PT Time Calculation (min) (ACUTE ONLY): 25 min   Charges:   PT Evaluation $PT Eval Moderate Complexity: 1 Procedure PT Treatments $Gait Training: 8-22 mins   PT G Codes:   PT G-Codes **NOT FOR INPATIENT CLASS** Functional Assessment Tool Used: Clinical Judgement Functional Limitation: Mobility: Walking and moving around Mobility: Walking and Moving Around Current Status (U9811): At least 20 percent but less than 40 percent impaired, limited or restricted Mobility: Walking and Moving Around Goal Status [phone]): At least 1 percent but less  than 20 percent impaired, limited or restricted    Elray Mcgregor 10/07/2015, 4:57 PM  Sheran Lawless, PT [phone] 10/07/2015

## 2015-10-07 NOTE — Progress Notes (Signed)
PROGRESS NOTE    Thomas Frye  ZOX:[id number] DOB: [birth year] DOA: 10/06/2015 PCP: Marylou FlesherAY,JAMES, MD   Outpatient Specialists:  [facility]   Brief Narrative:  On Thursday he ran out of Lantus but has been using the NovoLog over the last few days. He is scared to take too much insulin for fear of hypoglycemia given past events . Patient developed nausea and malaise this morning, family brought him to the emergency department he was found to be in DKA.     Assessment & Plan:   Principal Problem:   DKA (diabetic ketoacidoses) (HCC) Active Problems:   HTN (hypertension)   Anoxic encephalopathy (HCC)   Hyperlipidemia   GERD (gastroesophageal reflux disease)   ARF (acute renal failure) (HCC)   OSA on CPAP   Acute renal insufficiency   DKA. Precipitating event: Ran out of basal insulin 5 days ago. Using short acting insulin but fearful of taking too much given history of severe hypoglycemia.  -off insulin gtt -resume home regimen -wife to call endocrinologist to see when lastus will arrive (on medication assistance)  ARF.  -in 2016 1.1 -Avoid nephrotoxic medications -IV fluid resuscitation  Hyperkalemia  -resolved  History of anoxic brain injury secondary to severe hypoglycemia.  OSA.  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  -Resume pancreatic enzymes when tolerating meals -Resume Flagyl every third day (home regimen) when tolerating by mouth -Resume antidiarrheals when tolerating by mouth ? Gastroparesis-- ? Outpatient gastric emptying study  Urinary retention -allergic to sulfa so will not give flomax-- start cardura  Report of falls: PT Eval  DVT prophylaxis:  SQ Heparin  Code Status: Full Code   Family  Communication: patient  Disposition Plan:  tx to med surge floor once tolerating diet   Consultants:     Procedures:      Subjective: Says he is tired  Objective: Filed Vitals:   10/07/15 0200 10/07/15 0318 10/07/15 0600 10/07/15 0700  BP: 131/64 141/70 137/54 156/71  Pulse: 86 81 82 83  Temp:  98.9 F (37.2 C)  97.1 F (36.2 C)  TempSrc:  Axillary  Oral  Resp: 17 12 15 4   Height:      Weight:      SpO2: 93%  98% 99%    Intake/Output Summary (Last 24 hours) at 10/07/15 0831 Last data filed at 10/07/15 0600  Gross per 24 hour  Intake 3515.5 ml  Output   1250 ml  Net 2265.5 ml   Filed Weights   10/06/15 1628  Weight: 108.4 kg (238 lb 15.7 oz)    Examination:  General exam: Appears calm and comfortable - voice low Respiratory system: Clear to auscultation. Respiratory effort normal. Cardiovascular system: S1 & S2 heard, RRR. No JVD, murmurs, rubs, gallops or clicks. No pedal edema. Gastrointestinal system: Abdomen is nondistended, soft and nontender. No organomegaly or masses felt. Normal bowel sounds heard. Extremities: Symmetric 5 x 5 power. Skin: No rashes, lesions or ulcers Psychiatry: Judgement and insight appear normal. Mood & affect appropriate.     Data Reviewed: I have personally reviewed following labs and imaging studies  CBC:  Recent Labs Lab 10/06/15 1057 10/07/15 0345  WBC 20.2* 16.1*  HGB 13.9 12.0*  HCT 41.8 36.1*  MCV 99.5 95.5  PLT 254 230   Basic Metabolic Panel:  Recent Labs Lab 10/06/15 1224 10/06/15 1629 10/06/15 2151 10/06/15 2354 10/07/15 0345  NA 132* 137 140 142 142  K 6.2* 4.4 3.8 3.9 4.0  CL 94* 105 108 113* 112*  CO2 11* 11* 23 22 24   GLUCOSE 626* 432* 217* 172* 160*  BUN 44* 45* 40* 38* 33*  CREATININE 2.43* 2.19* 1.73* 1.50* 1.30*  CALCIUM 9.8 9.0 9.1 8.9 8.8*   GFR: Estimated Creatinine Clearance: 83.4 mL/min (by C-G formula based on Cr of 1.3). Liver Function Tests: No results for input(s): AST,  ALT, ALKPHOS, BILITOT, PROT, ALBUMIN in the last 168 hours. No results for input(s): LIPASE, AMYLASE in the last 168 hours. No results for input(s): AMMONIA in the last 168 hours. Coagulation Profile: No results for input(s): INR, PROTIME in the last 168 hours. Cardiac Enzymes: No results for input(s): CKTOTAL, CKMB, CKMBINDEX, TROPONINI in the last 168 hours. BNP (last 3 results) No results for input(s): PROBNP in the last 8760 hours. HbA1C: No results for input(s): HGBA1C in the last 72 hours. CBG:  Recent Labs Lab 10/07/15 0216 10/07/15 0319 10/07/15 0508 10/07/15 0617 10/07/15 0809  GLUCAP 128* 145* 156* 183* 207*   Lipid Profile: No results for input(s): CHOL, HDL, LDLCALC, TRIG, CHOLHDL, LDLDIRECT in the last 72 hours. Thyroid Function Tests: No results for input(s): TSH, T4TOTAL, FREET4, T3FREE, THYROIDAB in the last 72 hours. Anemia Panel: No results for input(s): VITAMINB12, FOLATE, FERRITIN, TIBC, IRON, RETICCTPCT in the last 72 hours. Urine analysis:    Component Value Date/Time   COLORURINE YELLOW 10/06/2015 1434   APPEARANCEUR CLOUDY* 10/06/2015 1434   LABSPEC 1.027 10/06/2015 1434   PHURINE 5.0 10/06/2015 1434   GLUCOSEU >1000* 10/06/2015 1434   HGBUR NEGATIVE 10/06/2015 1434   BILIRUBINUR NEGATIVE 10/06/2015 1434   KETONESUR >80* 10/06/2015 1434   PROTEINUR NEGATIVE 10/06/2015 1434   UROBILINOGEN 0.2 12/11/2012 0704   NITRITE NEGATIVE 10/06/2015 1434   LEUKOCYTESUR NEGATIVE 10/06/2015 1434      Recent Results (from the past 240 hour(s))  MRSA PCR Screening     Status: None   Collection Time: 10/06/15  6:19 PM  Result Value Ref Range Status   MRSA by PCR NEGATIVE NEGATIVE Final    Comment:        The GeneXpert MRSA Assay (FDA approved for NASAL specimens only), is one component of a comprehensive MRSA colonization surveillance program. It is not intended to diagnose MRSA infection nor to guide or monitor treatment for MRSA infections.        Anti-infectives    None       Radiology Studies: Dg Chest Portable 1 View  10/06/2015  CLINICAL DATA:  60 year old male with a history of hyperglycemia, weakness, vomiting EXAM: PORTABLE CHEST 1 VIEW COMPARISON:  12/09/2012 FINDINGS: Cardiomediastinal silhouette unchanged. No evidence of central vascular congestion. No interlobular septal thickening. No confluent airspace disease or pneumothorax.  No pleural effusion. Coarse interstitial markings similar prior. No displaced fracture. IMPRESSION: Chronic lung changes without evidence of superimposed acute cardiopulmonary disease. Signed, Yvone Neu. Loreta Ave, DO [facility] Radiology Electronically Signed   By: Gilmer Mor D.O.   On: 10/06/2015 12:27        Scheduled Meds: . doxazosin  0.5 mg Oral Daily  . escitalopram  10 mg Oral Daily  . heparin  5,000 Units Subcutaneous Q8H  . insulin aspart  0-15 Units Subcutaneous TID WC  . insulin aspart  0-5 Units Subcutaneous QHS  . insulin glargine  40 Units  Subcutaneous BID  . pantoprazole  40 mg Oral Daily  . pravastatin  40 mg Oral Daily   Continuous Infusions: . sodium chloride 100 mL/hr at 10/07/15 0625     LOS: 1 day    Time spent: 35 min    Breeze Angell U Ermal Brzozowski, DO [facility] Pager [phone]  If 7PM-7AM, please contact night-coverage [email or web address] Password [facility] 10/07/2015, 8:31 AM

## 2015-10-07 NOTE — Progress Notes (Signed)
0500: Pt Bladder scanned and showed 999cc of urine. RN tried to encourage pt to use urinal. Pt states unable to do so. Donnamarie PoagK. Kirby, NP notified, orders given for in and out cath.  Pt refused in and out and was able to use urinal producing 650 cc of urine.   [id number]: Pt bladder scanned again showing 756cc of urine.  Pt encouraged to see if he could urinate.  Pt states "not right now, maybe later".  Day Shift RN notified.

## 2015-10-08 DIAGNOSIS — G4733 Obstructive sleep apnea (adult) (pediatric): Secondary | ICD-10-CM

## 2015-10-08 DIAGNOSIS — N289 Disorder of kidney and ureter, unspecified: Secondary | ICD-10-CM | POA: Diagnosis not present

## 2015-10-08 DIAGNOSIS — I159 Secondary hypertension, unspecified: Secondary | ICD-10-CM | POA: Diagnosis not present

## 2015-10-08 DIAGNOSIS — E1310 Other specified diabetes mellitus with ketoacidosis without coma: Secondary | ICD-10-CM | POA: Diagnosis not present

## 2015-10-08 LAB — CBC
HCT: 35.7 % — ABNORMAL LOW (ref 39.0–52.0)
Hemoglobin: 12 g/dL — ABNORMAL LOW (ref 13.0–17.0)
MCH: 32.4 pg (ref 26.0–34.0)
MCHC: 33.6 g/dL (ref 30.0–36.0)
MCV: 96.5 fL (ref 78.0–100.0)
PLATELETS: 187 10*3/uL (ref 150–400)
RBC: 3.7 MIL/uL — ABNORMAL LOW (ref 4.22–5.81)
RDW: 13 % (ref 11.5–15.5)
WBC: 10.6 10*3/uL — ABNORMAL HIGH (ref 4.0–10.5)

## 2015-10-08 LAB — BASIC METABOLIC PANEL
Anion gap: 8 (ref 5–15)
BUN: 17 mg/dL (ref 6–20)
CALCIUM: 8.9 mg/dL (ref 8.9–10.3)
CO2: 24 mmol/L (ref 22–32)
Chloride: 105 mmol/L (ref 101–111)
Creatinine, Ser: 0.96 mg/dL (ref 0.61–1.24)
GFR calc Af Amer: >60 mL/min (ref >60)
GLUCOSE: 192 mg/dL — AB (ref 65–99)
Potassium: 3.9 mmol/L (ref 3.5–5.1)
Sodium: 137 mmol/L (ref 135–145)

## 2015-10-08 LAB — GLUCOSE, CAPILLARY
GLUCOSE-CAPILLARY: 210 mg/dL — AB (ref 65–99)
Glucose-Capillary: 173 mg/dL — ABNORMAL HIGH (ref 65–99)

## 2015-10-08 MED ORDER — DOXAZOSIN MESYLATE 1 MG PO TABS: 0.5000 mg | ORAL_TABLET | Freq: Every day | ORAL | Status: DC

## 2015-10-08 MED ORDER — METOCLOPRAMIDE HCL 5 MG PO TABS: 5.0000 mg | ORAL_TABLET | Freq: Four times a day (QID) | ORAL | Status: DC

## 2015-10-08 NOTE — Discharge Summary (Signed)
Physician Discharge Summary  Thomas CottonJohnny O Belloso UJW:[id number] DOB: 26-Oct-1955 DOA: 10/06/2015  PCP: Marylou FlesherAY,JAMES, MD  Admit date: 10/06/2015 Discharge date: 10/08/2015   Recommendations for Outpatient Follow-Up:   1. PT at home 2. Patient to go to [place] [facility] office and pick up lantus  3. Gastric emptying study 4. Outpatient BPH follow up   Discharge Diagnosis:   Principal Problem:   DKA (diabetic ketoacidoses) (HCC) Active Problems:   HTN (hypertension)   Anoxic encephalopathy (HCC)   Hyperlipidemia   GERD (gastroesophageal reflux disease)   ARF (acute renal failure) (HCC)   OSA on CPAP   Acute renal insufficiency   Discharge disposition:  Home.  Discharge Condition: Improved.  Diet recommendation: Low sodium, heart healthy/Carbohydrate-modified  Wound care: None.   History of Present Illness:   Thomas Frye is a 60 y.o. male with medical history significant for DM, depression with suicide attempts, history of anoxic brain injury secondary to severe hypoglycemic episode in 2014. CT scan at the time showed subacute or chronic infarcts right thalamus. Patient has right foot drop, wears brace  Patient takes insulin twice daily and NovoLog as needed. On Thursday he ran out of Lantus but has been using the NovoLog over the last few days. He is scared to take too much insulin for fear of hypoglycemia given past events . Patient developed nausea and malaise this morning, family brought him to the emergency department he was found to be in DKA.   Patient has chronic diarrhea. He is on pancreatic enzymes, takes dose of Flagyl every 3 days and takes daily antidiarrheals. [facility] Patient has been intentionally trying to lose weight through diet and exercise so insulin dose could be reduced   Hospital Course by Problem:   DKA. Precipitating event: Ran out of basal insulin 5 days ago. Using short acting insulin but fearful of taking too much given  history of severe hypoglycemia.  -off insulin gtt -resume home regimen -to pick up lantus from [facility]  ARF.  -in 2016 1.1 -Avoid nephrotoxic medications -IV fluid resuscitation- resolved function  Hyperkalemia  -resolved  History of anoxic brain injury secondary to severe hypoglycemia.  OSA.  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  -Resume pancreatic enzymes when tolerating meals -Resume Flagyl every third day (home regimen) when tolerating by mouth -Resume antidiarrheals when tolerating by mouth ? Gastroparesis-- ? Outpatient gastric emptying study  Urinary retention -allergic to sulfa so will not give flomax-- start cardura  Report of falls: PT Eval Home health PT    Medical Consultants:    None.   Discharge Exam:   Filed Vitals:   10/08/15 0500 10/08/15 0800  BP: 132/65 166/86  Pulse: 79 90  Temp:  98.3 F (36.8 C)  Resp: 15 16   Filed Vitals:   10/08/15 0100 10/08/15 0343 10/08/15 0500 10/08/15 0800  BP: 150/69 149/67 132/65 166/86  Pulse: 71 86 79 90  Temp:  98 F (36.7 C)  98.3 F (36.8 C)  TempSrc:  Axillary  Oral  Resp: 0 14 15 16   Height:      Weight:  114.7 kg (252 lb 13.9 oz)    SpO2: 97%  97% 98%    Gen:  NAD Cardiovascular:  RRR, No M/R/G Respiratory: Lungs CTAB Gastrointestinal: Abdomen soft,  NT/ND with normal active bowel sounds. Extremities: No C/E/C   The results of significant diagnostics from this hospitalization (including imaging, microbiology, ancillary and laboratory) are listed below for reference.     Procedures and Diagnostic Studies:   Dg Chest Portable 1 View  10/06/2015  CLINICAL DATA:  60 year old male with a history of hyperglycemia, weakness, vomiting EXAM: PORTABLE CHEST 1 VIEW  COMPARISON:  12/09/2012 FINDINGS: Cardiomediastinal silhouette unchanged. No evidence of central vascular congestion. No interlobular septal thickening. No confluent airspace disease or pneumothorax.  No pleural effusion. Coarse interstitial markings similar prior. No displaced fracture. IMPRESSION: Chronic lung changes without evidence of superimposed acute cardiopulmonary disease. Signed, Yvone Neu. Loreta Ave, DO [facility] Radiology Electronically Signed   By: Gilmer Mor D.O.   On: 10/06/2015 12:27     Labs:   Basic Metabolic Panel:  Recent Labs Lab 10/06/15 1629 10/06/15 2151 10/06/15 2354 10/07/15 0345 10/08/15 0244  NA 137 140 142 142 137  K 4.4 3.8 3.9 4.0 3.9  CL 105 108 113* 112* 105  CO2 11* GLUCOSE 432* 217* 172* 160* 192*  BUN 45* 40* 38* 33* 17  CREATININE 2.19* 1.73* 1.50* 1.30* 0.96  CALCIUM 9.0 9.1 8.9 8.8* 8.9   GFR Estimated Creatinine Clearance: 116 mL/min (by C-G formula based on Cr of 0.96). Liver Function Tests: No results for input(s): AST, ALT, ALKPHOS, BILITOT, PROT, ALBUMIN in the last 168 hours. No results for input(s): LIPASE, AMYLASE in the last 168 hours. No results for input(s): AMMONIA in the last 168 hours. Coagulation profile No results for input(s): INR, PROTIME in the last 168 hours.  CBC:  Recent Labs Lab 10/06/15 1057 10/07/15 0345 10/08/15 0244  WBC 20.2* 16.1* 10.6*  HGB 13.9 12.0* 12.0*  HCT 41.8 36.1* 35.7*  MCV 99.5 95.5 96.5  PLT 254 230 187   Cardiac Enzymes: No results for input(s): CKTOTAL, CKMB, CKMBINDEX, TROPONINI in the last 168 hours. BNP: Invalid input(s): POCBNP CBG:  Recent Labs Lab 10/07/15 0809 10/07/15 1218 10/07/15 1532 10/07/15 2110 10/08/15 0817  GLUCAP 207* 222* 225* 236* 173*   D-Dimer No results for input(s): DDIMER in the last 72 hours. Hgb A1c  Recent Labs  10/07/15 0929  HGBA1C 8.3*   Lipid Profile No results for  input(s): CHOL, HDL, LDLCALC, TRIG, CHOLHDL, LDLDIRECT in the last 72 hours. Thyroid function studies No results for input(s): TSH, T4TOTAL, T3FREE, THYROIDAB in the last 72 hours.  Invalid input(s): FREET3 Anemia work up No results for input(s): VITAMINB12, FOLATE, FERRITIN, TIBC, IRON, RETICCTPCT in the last 72 hours. Microbiology Recent Results (from the past 240 hour(s))  MRSA PCR Screening     Status: None   Collection Time: 10/06/15  6:19 PM  Result Value Ref Range Status   MRSA by PCR NEGATIVE NEGATIVE Final    Comment:        The GeneXpert MRSA Assay (FDA approved for NASAL specimens only), is one component of a comprehensive MRSA colonization surveillance program. It is not intended to diagnose MRSA infection nor to guide or monitor treatment for MRSA infections.      Discharge Instructions:   Discharge Instructions    Diet - low sodium heart healthy    Complete by:  As directed      Diet Carb Modified    Complete by:  As directed      Discharge instructions    Complete by:  As directed   Outpatient gastric emptying  study- for possible gastroparesis     Increase activity slowly    Complete by:  As directed             Medication List    TAKE these medications        doxazosin 1 MG tablet  Commonly known as:  CARDURA  Take 0.5 tablets (0.5 mg total) by mouth daily.     insulin aspart 100 UNIT/ML injection  Commonly known as:  novoLOG  CHECK YOUR CBG 3 TIMES A DAY WITH MEALS AND USE A SLIDING SCALE AS FOLLOWS: For CBG 121-150 inject 1unit of insulin For CBG 151-200 inject 2units of insulin For CBG 201-250 inject 3units of insulin  For CBG 251-300 inject 5units of insulin For CBG 301-350 inject 7units of insulin For CBG 351-400 inject 9units of insulin For CBG >400 call your MD     insulin glargine 100 UNIT/ML injection  Commonly known as:  LANTUS  Inject 40 Units into the skin 2 (two) times daily. Patient states he is taking this medication morning and  night     loperamide 2 MG capsule  Commonly known as:  IMODIUM  Take by mouth as needed for diarrhea or loose stools.     meloxicam 15 MG tablet  Commonly known as:  MOBIC  Take 15 mg by mouth daily.     metoCLOPramide 5 MG tablet  Commonly known as:  REGLAN  Take 1 tablet (5 mg total) by mouth 4 (four) times daily.     metroNIDAZOLE 500 MG tablet  Commonly known as:  FLAGYL  Take 500 mg by mouth See admin instructions. Take every three days, last dose was on 10-03-15     omeprazole 40 MG capsule  Commonly known as:  PRILOSEC  Take 40 mg by mouth 2 (two) times daily.     pravastatin 40 MG tablet  Commonly known as:  PRAVACHOL  Take 40 mg by mouth daily.     PROBIOTIC PO  Take 1 tablet by mouth daily.     ZENPEP PO  Take 1 tablet by mouth See admin instructions. Take 3 tablets by mouth with every meal. Then take 2 tablets by mouth with every snacks. Per wife and patient           Follow-up Information    Follow up with DAY,JAMES, MD In 1 week.   Specialty:  Internal Medicine   Why:  gastric emptying study       Follow up with JONES, AUTUMN SHARIE, PA-C.   Specialty:  Internal Medicine   Why:  to pick up insulin   Contact information:   [address] [place] [address] [phone]        Time coordinating discharge: 35 min  Signed:  Sibel Khurana Juanetta Gosling   [facility] 10/08/2015, 12:15 PM

## 2015-10-08 NOTE — Care Management Note (Addendum)
Case Management Note  Patient Details  Name: Gevena CottonJohnny O Keegan MRN: [id number] Date of Birth: 05-09-1955  Subjective/Objective:                  Patient here with hyperglycemia and vomiting since yesterday, patient appears sleepy on arrival but will open eyes and answer questions with verbal stimuli, complains of weakness with same./ From home with spouse.      Action/Plan: Follow for disposition needs.   Expected Discharge Date:  10/08/15               Expected Discharge Plan:  Home/Self Care  In-House Referral:     Discharge planning Services  CM Consult  Post Acute Care Choice:    Choice offered to:     DME Arranged:    DME Agency:     HH Arranged:    HH Agency:     Status of Service:  Completed, signed off  If discussed at [employer] Length of Stay Meetings, dates discussed:    Additional Comments: 10/08/2015  Pt declined HH as recommended - pt is ambulatory in room.  CM informed pt that if he feels that he needs HH once discharge to request order from PCP.  CM recevied call back from Tonya at endocrinologist - office has placed 2 boxes of sample for pt and forms required to be completed for medication assistance will be left at front office with samples.  Per office; forms only need signature from pt and can be mailed from office - samples given today should be sufficient until medication assistance renewal process is completed .  CM informed pt to go directly to endocrinologist office to get samples and sign paperwork - also informed pt to contact office prior to running out of samples to ensure no lapse in insulin coverage occurs.  CM assessed pt - pt states he is independent from home with wife.  Pt informed that he has had trouble obtaining his lantus recently.   PT informed CM that he is already set up with medication assistance via his [facility] - [facility] to admit [facility].   Pt  states he has been unable to get his lantus because he hasn't filled out required medication assistance.  Pt states that he was unaware the forms needed to be submitted until he called Dr office during this admit to inquire about not getting refills on lantus.  CM contacted PA office and requested call back relating to:  Arranging for samples on day of discharge with enough inventory to cover pt until medical forms can be approved, requested office to fax CM required documents so CM can help pt fill out prior to discharge.  Pt verified that he has active [employer] and therefore CM can not provide pt assistance. Cherylann ParrClaxton, Aften Lipsey S, RN 10/08/2015, 1:23 PM

## 2015-10-08 NOTE — Progress Notes (Signed)
Physical Therapy Treatment Patient Details Name: Thomas Frye MRN: [id number] DOB: 01-Mar-1956 Today's Date: 10/08/2015    History of Present Illness Thomas Frye is a 60 y.o. male with a Past Medical History of diabetes, depression, GERD, neuropathy, sleep apnea, left foot drop, CVA, sciatica who presents with DKA acute kidney injury and hyperkalemia.    PT Comments    Patient for d/c soon, but took time to educate on safety with stairs for home entry with cane only and friend to assist.  Patient remains at high fall risk.  Still feel he needs follow up HHPT, but pt reports unable to get set up due to insurance.  Encouraged not to go to the gym, but to work on current balance HEP he received at outpatient therapy in the past.    Follow Up Recommendations  Home health PT     Equipment Recommendations  None recommended by PT    Recommendations for Other Services       Precautions / Restrictions Precautions Precautions: Fall Required Braces or Orthoses: Other Brace/Splint Other Brace/Splint: R AFO for foot drop    Mobility  Bed Mobility               General bed mobility comments: sitting on EOB dressed awaiting D/C  Transfers Overall transfer level: Needs assistance Equipment used: Straight cane   Sit to Stand: Supervision         General transfer comment: up from EOB with cane, assist for safety with balance  Ambulation/Gait Ambulation/Gait assistance: Min guard Ambulation Distance (Feet): 10 Feet Assistive device: Straight cane Gait Pattern/deviations: Step-through pattern;Decreased stride length     General Gait Details: in room only with step taps to edge of trashcan with minguard and cane for support, educated on need for taking his time wtih home entry due to no rails at entry point he prefers as steps are fewer and not as steep.  Friend in room who will assist on steps today for home entry   Stairs            Wheelchair Mobility     Modified Rankin (Stroke Patients Only)       Balance                                    Cognition Arousal/Alertness: Awake/alert   Overall Cognitive Status: History of cognitive impairments - at baseline     Current Attention Level: Selective Memory: Decreased short-term memory   Safety/Judgement: Decreased awareness of safety;Decreased awareness of deficits   Problem Solving: Slow processing      Exercises      General Comments General comments (skin integrity, edema, etc.): Discussed HHPT and pt reports won't happen due to insurance restrictions.  Patient encouraged to initiate his previous HEP for balance.  Encouraged not to go to [facility] for awhile until much more stable and safe.  Friend stopped me in hallway to ask about driving; i encouraged for pt not to drive and he stated would speak to pt's daughter.       Pertinent Vitals/Pain Pain Assessment: No/denies pain    Home Living                      Prior Function            PT Goals (current goals can now be found in the care plan section) Progress towards PT  goals: Progressing toward goals    Frequency  Min 3X/week    PT Plan Current plan remains appropriate    Co-evaluation             End of Session   Activity Tolerance: Patient tolerated treatment well Patient left: in bed;with call bell/phone within reach;with family/visitor present     Time: [id number] PT Time Calculation (min) (ACUTE ONLY): 8 min  Charges:  $Self Care/Home Management: 8-22                    G Codes:      Elray McgregorCynthia Wynn 10/08/2015, 5:10 PM  Sheran Lawlessyndi Wynn, PT [phone] 10/08/2015

## 2020-07-13 DIAGNOSIS — Z89512 Acquired absence of left leg below knee: Secondary | ICD-10-CM

## 2021-01-20 ENCOUNTER — Emergency Department (HOSPITAL_BASED_OUTPATIENT_CLINIC_OR_DEPARTMENT_OTHER): Payer: Medicare Other

## 2021-01-20 ENCOUNTER — Other Ambulatory Visit: Payer: Self-pay

## 2021-01-20 ENCOUNTER — Emergency Department (HOSPITAL_BASED_OUTPATIENT_CLINIC_OR_DEPARTMENT_OTHER)
Admission: EM | Admit: 2021-01-20 | Discharge: 2021-01-20 | Disposition: A | Discharge status: Home / Self Care | Payer: Medicare Other | Attending: Emergency Medicine | Admitting: Emergency Medicine

## 2021-01-20 ENCOUNTER — Encounter (HOSPITAL_BASED_OUTPATIENT_CLINIC_OR_DEPARTMENT_OTHER): Payer: Self-pay

## 2021-01-20 DIAGNOSIS — Z794 Long term (current) use of insulin: Secondary | ICD-10-CM | POA: Diagnosis not present

## 2021-01-20 DIAGNOSIS — R5383 Other fatigue: Secondary | ICD-10-CM | POA: Diagnosis present

## 2021-01-20 DIAGNOSIS — Z20822 Contact with and (suspected) exposure to covid-19: Secondary | ICD-10-CM | POA: Diagnosis not present

## 2021-01-20 DIAGNOSIS — I10 Essential (primary) hypertension: Secondary | ICD-10-CM | POA: Insufficient documentation

## 2021-01-20 DIAGNOSIS — R531 Weakness: Secondary | ICD-10-CM | POA: Diagnosis not present

## 2021-01-20 DIAGNOSIS — Z91040 Latex allergy status: Secondary | ICD-10-CM | POA: Diagnosis not present

## 2021-01-20 DIAGNOSIS — R739 Hyperglycemia, unspecified: Secondary | ICD-10-CM

## 2021-01-20 DIAGNOSIS — E1165 Type 2 diabetes mellitus with hyperglycemia: Secondary | ICD-10-CM | POA: Diagnosis not present

## 2021-01-20 LAB — CBG MONITORING, ED
Glucose-Capillary: 493 mg/dL — ABNORMAL HIGH (ref 70–99)
Glucose-Capillary: 417 mg/dL — ABNORMAL HIGH (ref 70–99)
Glucose-Capillary: 383 mg/dL — ABNORMAL HIGH (ref 70–99)
Glucose-Capillary: 279 mg/dL — ABNORMAL HIGH (ref 70–99)

## 2021-01-20 LAB — I-STAT VENOUS BLOOD GAS, ED
Acid-base deficit: 1 mmol/L (ref 0.0–2.0)
Bicarbonate: 25.4 mmol/L (ref 20.0–28.0)
Calcium, Ion: 1.28 mmol/L (ref 1.15–1.40)
HCT: 37 % — ABNORMAL LOW (ref 39.0–52.0)
Hemoglobin: 12.6 g/dL — ABNORMAL LOW (ref 13.0–17.0)
O2 Saturation: 73 %
Potassium: 4.8 mmol/L (ref 3.5–5.1)
Sodium: 134 mmol/L — ABNORMAL LOW (ref 135–145)
TCO2: 27 mmol/L (ref 22–32)
pCO2, Ven: 49.4 mmHg (ref 44.0–60.0)
pH, Ven: 7.319 (ref 7.250–7.430)
pO2, Ven: 42 mmHg (ref 32.0–45.0)

## 2021-01-20 LAB — CBC
HCT: 37.1 % — ABNORMAL LOW (ref 39.0–52.0)
Hemoglobin: 12.4 g/dL — ABNORMAL LOW (ref 13.0–17.0)
MCH: 32.8 pg (ref 26.0–34.0)
MCHC: 33.4 g/dL (ref 30.0–36.0)
MCV: 98.1 fL (ref 80.0–100.0)
Platelets: 240 10*3/uL (ref 150–400)
RBC: 3.78 MIL/uL — ABNORMAL LOW (ref 4.22–5.81)
RDW: 12.8 % (ref 11.5–15.5)
WBC: 9.7 10*3/uL (ref 4.0–10.5)
nRBC: 0 % (ref 0.0–0.2)

## 2021-01-20 LAB — URINALYSIS, ROUTINE W REFLEX MICROSCOPIC
Bilirubin Urine: NEGATIVE
Glucose, UA: >=500 mg/dL — AB
Hgb urine dipstick: NEGATIVE
Ketones, ur: 80 mg/dL — AB
Leukocytes,Ua: NEGATIVE
Nitrite: NEGATIVE
Protein, ur: NEGATIVE mg/dL
Specific Gravity, Urine: 1.015 (ref 1.005–1.030)
pH: 5.5 (ref 5.0–8.0)

## 2021-01-20 LAB — BASIC METABOLIC PANEL
Anion gap: 15 (ref 5–15)
BUN: 34 mg/dL — ABNORMAL HIGH (ref 8–23)
CO2: 22 mmol/L (ref 22–32)
Calcium: 9.7 mg/dL (ref 8.9–10.3)
Chloride: 96 mmol/L — ABNORMAL LOW (ref 98–111)
Creatinine, Ser: 1.57 mg/dL — ABNORMAL HIGH (ref 0.61–1.24)
GFR, Estimated: 49 mL/min — ABNORMAL LOW (ref >60)
Glucose, Bld: 446 mg/dL — ABNORMAL HIGH (ref 70–99)
Potassium: 4.5 mmol/L (ref 3.5–5.1)
Sodium: 133 mmol/L — ABNORMAL LOW (ref 135–145)

## 2021-01-20 LAB — RESP PANEL BY RT-PCR (FLU A&B, COVID) ARPGX2
Influenza A by PCR: NEGATIVE
Influenza B by PCR: NEGATIVE
SARS Coronavirus 2 by RT PCR: NEGATIVE

## 2021-01-20 LAB — URINALYSIS, MICROSCOPIC (REFLEX)
RBC / HPF: NONE SEEN RBC/hpf (ref 0–5)
WBC, UA: NONE SEEN WBC/hpf (ref 0–5)

## 2021-01-20 MED ORDER — SODIUM CHLORIDE 0.9 % IV BOLUS
1000.0000 mL | Freq: Once | INTRAVENOUS | Status: AC
  Administered 2021-01-20: 1000 mL via INTRAVENOUS

## 2021-01-20 MED ORDER — DEXTROSE 50 % IV SOLN: 0.0000 mL | INTRAVENOUS | Status: DC | PRN

## 2021-01-20 MED ORDER — INSULIN REGULAR(HUMAN) IN NACL 100-0.9 UT/100ML-% IV SOLN
INTRAVENOUS | Status: DC
  Administered 2021-01-20: 13 [IU]/h via INTRAVENOUS
  Filled 2021-01-20: qty 100

## 2021-01-20 MED ORDER — DEXTROSE IN LACTATED RINGERS 5 % IV SOLN: INTRAVENOUS | Status: DC

## 2021-01-20 MED ORDER — LACTATED RINGERS IV SOLN
INTRAVENOUS | Status: DC
Start: 2021-01-20 — End: 2021-01-21

## 2021-01-20 NOTE — ED Triage Notes (Signed)
Pt reports elevated BS since 2am-NAD-to triage in w/c

## 2021-01-20 NOTE — ED Provider Notes (Signed)
[facility] EMERGENCY DEPARTMENT Provider Note   CSN: [id number] Arrival date & time: 01/20/21  1231     History Chief Complaint  Patient presents with   Hyperglycemia    Thomas Frye is a 65 y.o. male.  65 y.o male with a PMH of ARF,DKA, presents to the ED with a chief complaint of elevated blood sugar.  According to patient, he noted his blood sugar to be elevated around the 500 levels this morning, dates that their home nurse also noted the blood sugar to be elevated.  She did provide him with an extra insulin to help with symptoms.  He also endorses overall fatigue along with weakness.  Does report his blood sugar runs along the baseline of the 100-2 100s.  In addition, he also endorses a dry cough, attributes this to allergies however oxygen saturation in the room is at 92%.  Dates that he feels like "overall when sugar is really high ", has had some decrease in oral intake as he reports "I do not want to get DKA ".  He does have a prior history of stroke in the month of September, they felt that this was likely attributed to elevated blood sugars.  He denies any fevers, urinary symptoms, diarrhea, sick contacts or others complaints. No chest pain or shortness of breath.      The history is provided by the patient and the spouse.  Hyperglycemia Associated symptoms: weakness   Associated symptoms: no abdominal pain, no chest pain, no dizziness, no fever, no nausea, no shortness of breath and no vomiting       Past Medical History:  Diagnosis Date   ARF (acute renal failure) (HCC) 09/2015   Cerebrovascular disease    Right thalamic, left parietal white matter strokes   Depression    Diabetes mellitus    Diabetic peripheral neuropathy (HCC)    DKA (diabetic ketoacidoses) 09/2015   Gait disturbance    GERD (gastroesophageal reflux disease)    H/O hiatal hernia    Left foot drop 10/05/2012   Neuromuscular disorder (HCC)    neuropathy   Polyneuropathy in  diabetes(357.2) 10/05/2012   Sciatic neuropathy    Left-sided nerve   Sleep apnea    wears cpap at night    Patient Active Problem List   Diagnosis Date Noted   DKA (diabetic ketoacidoses) 10/06/2015   Hyperlipidemia 10/06/2015   GERD (gastroesophageal reflux disease) 10/06/2015   ARF (acute renal failure) (HCC) 10/06/2015   OSA on CPAP 10/06/2015   Acute renal insufficiency    Nausea & vomiting 12/11/2012   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.2) 10/05/2012   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, limited to breakdown of skin (HCC) 08/19/2012   Acute respiratory failure in setting of profound hypoglycemia 08/18/2012   Severe diabetic hypoglycemia (HCC) 08/18/2012   DM (diabetes mellitus), type 2 with peripheral vascular complications (HCC) 08/18/2012   Acute encephalopathy 08/18/2012   HTN (hypertension) 08/18/2012   Noncompliance 08/18/2012    Past Surgical History:  Procedure Laterality Date   EYE SURGERY     FRACTURE SURGERY     Questionable steel screws to right tibia   TOE AMPUTATION  2008       Family History  Problem Relation Age of Onset   Breast cancer Mother    Melanoma Mother    Diabetes Mother  Diabetes Brother    Diabetes Maternal Grandmother     Social History   Tobacco Use   Smoking status: Never   Smokeless tobacco: Never  Vaping Use   Vaping Use: Never used  Substance Use Topics   Alcohol use: Not Currently    Alcohol/week: 1.0 standard drink    Types: 1 Glasses of wine per week   Drug use: No    Home Medications Prior to Admission medications   Medication Sig Start Date End Date Taking? Authorizing Provider  doxazosin (CARDURA) 1 MG tablet Take 0.5 tablets (0.5 mg total) by mouth daily. 10/08/15   Joseph Art, DO  insulin aspart (NOVOLOG) 100 UNIT/ML injection CHECK YOUR CBG 3 TIMES A DAY  WITH MEALS AND USE A SLIDING SCALE AS FOLLOWS: For CBG 121-150 inject 1unit of insulin For CBG 151-200 inject 2units of insulin For CBG 201-250 inject 3units of insulin  For CBG 251-300 inject 5units of insulin For CBG 301-350 inject 7units of insulin For CBG 351-400 inject 9units of insulin For CBG >400 call your MD 12/15/12   Lonia Blood, MD  insulin glargine (LANTUS) 100 UNIT/ML injection Inject 40 Units into the skin 2 (two) times daily. Patient states he is taking this medication morning and night    [provider]  loperamide (IMODIUM) 2 MG capsule Take by mouth as needed for diarrhea or loose stools.    [provider]  meloxicam (MOBIC) 15 MG tablet Take 15 mg by mouth daily.    [provider]  metoCLOPramide (REGLAN) 5 MG tablet Take 1 tablet (5 mg total) by mouth 4 (four) times daily. 10/08/15   Joseph Art, DO  metroNIDAZOLE (FLAGYL) 500 MG tablet Take 500 mg by mouth See admin instructions. Take every three days, last dose was on 10-03-15    [provider]  omeprazole (PRILOSEC) 40 MG capsule Take 40 mg by mouth 2 (two) times daily.    [provider]  Pancrelipase, Lip-Prot-Amyl, (ZENPEP PO) Take 1 tablet by mouth See admin instructions. Take 3 tablets by mouth with every meal. Then take 2 tablets by mouth with every snacks. Per wife and patient    [provider]  pravastatin (PRAVACHOL) 40 MG tablet Take 40 mg by mouth daily.    [provider]  Probiotic Product (PROBIOTIC PO) Take 1 tablet by mouth daily.    [provider]    Allergies    Latex and Sulfa antibiotics  Review of Systems   Review of Systems  Constitutional:  Negative for chills and fever.  HENT:  Negative for sore throat.   Respiratory:  Negative for shortness of breath.   Cardiovascular:  Negative for chest pain.  Gastrointestinal:  Negative for abdominal pain, nausea and vomiting.  Genitourinary:  Negative for difficulty  urinating.  Neurological:  Positive for weakness. Negative for dizziness and numbness.  All other systems reviewed and are negative.  Physical Exam Updated Vital Signs BP 133/78   Pulse 82   Temp 98.3 F (36.8 C) (Oral)   Resp 14   Ht 6\' 3"  (1.905 m)   Wt (!) 141.1 kg   SpO2 98%   BMI 38.87 kg/m   Physical Exam Vitals and nursing note reviewed.  Constitutional:      Appearance: He is well-developed. He is ill-appearing.  HENT:     Head: Normocephalic and atraumatic.  Eyes:     General: No scleral icterus.    Pupils: Pupils are equal, round,  and reactive to light.  Cardiovascular:     Heart sounds: Normal heart sounds.  Pulmonary:     Effort: Pulmonary effort is normal.     Breath sounds: Normal breath sounds. No wheezing.  Chest:     Chest wall: No tenderness.  Abdominal:     General: Bowel sounds are normal. There is no distension.     Palpations: Abdomen is soft.     Tenderness: There is no abdominal tenderness.  Musculoskeletal:        General: No tenderness or deformity.     Cervical back: Normal range of motion.     Left Lower Extremity: Left leg is amputated above knee.  Feet:     Right foot:     Skin integrity: Ulcer, erythema and dry skin present.     Comments: Well healed ulcer, multiple digit amputations, +1 pitting edema.  Skin:    General: Skin is warm and dry.  Neurological:     Mental Status: He is alert and oriented to person, place, and time.    ED Results / Procedures / Treatments   Labs (all labs ordered are listed, but only abnormal results are displayed) Labs Reviewed  BASIC METABOLIC PANEL - Abnormal; Notable for the following components:      Result Value   Sodium 133 (*)    Chloride 96 (*)    Glucose, Bld 446 (*)    BUN 34 (*)    Creatinine, Ser 1.57 (*)    GFR, Estimated 49 (*)    All other components within normal limits  CBC - Abnormal; Notable for the following components:   RBC 3.78 (*)    Hemoglobin 12.4 (*)    HCT 37.1  (*)    All other components within normal limits  URINALYSIS, ROUTINE W REFLEX MICROSCOPIC - Abnormal; Notable for the following components:   Glucose, UA >=500 (*)    Ketones, ur 80 (*)    All other components within normal limits  URINALYSIS, MICROSCOPIC (REFLEX) - Abnormal; Notable for the following components:   Bacteria, UA RARE (*)    All other components within normal limits  CBG MONITORING, ED - Abnormal; Notable for the following components:   Glucose-Capillary 493 (*)    All other components within normal limits  CBG MONITORING, ED - Abnormal; Notable for the following components:   Glucose-Capillary 417 (*)    All other components within normal limits  I-STAT VENOUS BLOOD GAS, ED - Abnormal; Notable for the following components:   Sodium 134 (*)    HCT 37.0 (*)    Hemoglobin 12.6 (*)    All other components within normal limits  CBG MONITORING, ED - Abnormal; Notable for the following components:   Glucose-Capillary 383 (*)    All other components within normal limits  CBG MONITORING, ED - Abnormal; Notable for the following components:   Glucose-Capillary 279 (*)    All other components within normal limits  RESP PANEL BY RT-PCR (FLU A&B, COVID) ARPGX2  BLOOD GAS, VENOUS  BETA-HYDROXYBUTYRIC ACID    EKG None  Radiology DG Chest Portable 1 View  Result Date: 01/20/2021 CLINICAL DATA:  Fatigue. Elevated blood sugar since 2 a.m. No shortness breath or chest pain. EXAM: PORTABLE CHEST 1 VIEW COMPARISON:  Chest radiograph 10/06/2015 FINDINGS: Of note, patient is mildly rotated on this portable exam. Mildly enlarged cardiac silhouette, which may be exaggerated by portable technique. Subsegmental left basilar atelectasis. Otherwise, no focal consolidation, pleural effusion, or pneumothorax. No acute  osseous abnormality. Linear metallic density projecting at the medial aspect of the left hemidiaphragm is presumed to be external to the patient. IMPRESSION: No acute  cardiopulmonary abnormality. Electronically Signed   By: Sherron Ales M.D.   On: 01/20/2021 17:04    Procedures Procedures   Medications Ordered in ED Medications  insulin regular, human (MYXREDLIN) 100 units/ 100 mL infusion (10.5 Units/hr Intravenous Rate/Dose Change 01/20/21 1921)  lactated ringers infusion ( Intravenous New Bag/Given 01/20/21 1754)  dextrose 5 % in lactated ringers infusion (0 mLs Intravenous Hold 01/20/21 1755)  dextrose 50 % solution 0-50 mL (has no administration in time range)  sodium chloride 0.9 % bolus 1,000 mL (0 mLs Intravenous Stopped 01/20/21 1755)    ED Course  I have reviewed the triage vital signs and the nursing notes.  Pertinent labs & imaging results that were available during my care of the patient were reviewed by me and considered in my medical decision making (see chart for details).  Clinical Course as of 01/20/21 1946  Wed Jan 20, 2021  1943 Glucose-Capillary(!): 279 [JS]    Clinical Course User Index [JS] Claude Manges, [place]   MDM Rules/Calculators/A&P   Patient presents to the ED with a chief complaint of elevated blood glucose around the 500 level since this morning.  Reports feeling overall weak along with fatigue, kind of how he feels when his blood sugar is elevated.  He denies any recent infection, no URI symptoms, no fever, no shortness of breath nor abdominal pain.  Patient does wear an insulin pump that regulates his blood sugar as needed, received additional insulin by nurse tech today on home visit.  On arrival patient looks overall chronically ill-appearing, abdomen is soft nontender to palpation with insulin pump in place.  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  All upper and lower extremities without any facial asymmetry or dysarthria.  Bladder scan ~400, after  receiving a liter bolus he was able to void on his own without the placement of Foley.  Interpretation of his labs revealed a CBG of 417 on arrival, CBC with no leukocytosis, hemoglobin is slightly decreased within his limit.  BMP with slight decrease in his sodium, creatinine level is 1.5, does have an AKI, suspicion likely due to his decreased in oral intake.  Anion gap is within normal limits.  Rapid flu, COVID is negative on today's visit.  UA with some rare bacteria, and some ketones present but no nitrites or leukocytes to suggest infection.  X-ray without any acute findings such as pneumonia, pleural effusion.  Patient was placed on a glucose stabilizer.  This brought his blood sugar down to 279, on recheck patient and wife at the bedside requesting disposition home.  He does report improvement in his symptoms after receiving fluids.  I did discuss with him importance of continuing to hydrate with plenty of water.No gap and lower suspicion for DKA.   There is no signs of infection and his work-up thus far with a clear x-ray, UA without any signs of infection, along with I have a low suspicion of cellulitis of his right leg with ulcers that are well-appearing.  I did discuss continue follow-up and strict return precautions.  They are agreeable of plan and management at this time.  Patient is stable for discharge.   Portions of  this note were generated with Scientist, clinical (histocompatibility and immunogenetics). Dictation errors may occur despite best attempts at proofreading.  Final Clinical Impression(s) / ED Diagnoses Final diagnoses:  Hyperglycemia    Rx / DC Orders ED Discharge Orders     None        Claude Manges, PA-C 01/20/21 1946    Sloan Leiter, DO 01/20/21 2008

## 2021-01-20 NOTE — Discharge Instructions (Addendum)
We discussed the importance of monitoring your blood sugar while at home.  Will need to continue to hydrate with plenty of water, do not hydrate with sodas.  Return to the emergency department if your symptoms do not improve.

## 2021-01-21 LAB — BETA-HYDROXYBUTYRIC ACID: Beta-Hydroxybutyric Acid: 2.78 mmol/L — ABNORMAL HIGH (ref 0.05–0.27)

## 2021-03-30 DIAGNOSIS — F332 Major depressive disorder, recurrent severe without psychotic features: Secondary | ICD-10-CM | POA: Diagnosis present

## 2021-06-10 ENCOUNTER — Emergency Department (HOSPITAL_COMMUNITY): Payer: Medicare Other

## 2021-06-10 ENCOUNTER — Observation Stay (HOSPITAL_COMMUNITY): Payer: Medicare Other

## 2021-06-10 ENCOUNTER — Observation Stay (HOSPITAL_COMMUNITY)
Admission: EM | Admit: 2021-06-10 | Discharge: 2021-06-17 | Disposition: A | Discharge status: Skilled Nursing Facility | Payer: Medicare Other | Attending: Internal Medicine | Admitting: Internal Medicine

## 2021-06-10 ENCOUNTER — Encounter (HOSPITAL_COMMUNITY): Payer: Self-pay | Admitting: Emergency Medicine

## 2021-06-10 ENCOUNTER — Other Ambulatory Visit: Payer: Self-pay

## 2021-06-10 DIAGNOSIS — K8689 Other specified diseases of pancreas: Secondary | ICD-10-CM

## 2021-06-10 DIAGNOSIS — E1151 Type 2 diabetes mellitus with diabetic peripheral angiopathy without gangrene: Secondary | ICD-10-CM | POA: Diagnosis not present

## 2021-06-10 DIAGNOSIS — E1140 Type 2 diabetes mellitus with diabetic neuropathy, unspecified: Secondary | ICD-10-CM | POA: Insufficient documentation

## 2021-06-10 DIAGNOSIS — E1165 Type 2 diabetes mellitus with hyperglycemia: Secondary | ICD-10-CM | POA: Diagnosis not present

## 2021-06-10 DIAGNOSIS — M869 Osteomyelitis, unspecified: Secondary | ICD-10-CM

## 2021-06-10 DIAGNOSIS — E1051 Type 1 diabetes mellitus with diabetic peripheral angiopathy without gangrene: Secondary | ICD-10-CM | POA: Diagnosis not present

## 2021-06-10 DIAGNOSIS — Z79899 Other long term (current) drug therapy: Secondary | ICD-10-CM | POA: Diagnosis not present

## 2021-06-10 DIAGNOSIS — D649 Anemia, unspecified: Secondary | ICD-10-CM | POA: Diagnosis not present

## 2021-06-10 DIAGNOSIS — I10 Essential (primary) hypertension: Secondary | ICD-10-CM | POA: Diagnosis not present

## 2021-06-10 DIAGNOSIS — E11319 Type 2 diabetes mellitus with unspecified diabetic retinopathy without macular edema: Secondary | ICD-10-CM | POA: Insufficient documentation

## 2021-06-10 DIAGNOSIS — L03116 Cellulitis of left lower limb: Secondary | ICD-10-CM | POA: Diagnosis present

## 2021-06-10 DIAGNOSIS — Z91040 Latex allergy status: Secondary | ICD-10-CM | POA: Diagnosis not present

## 2021-06-10 DIAGNOSIS — R0902 Hypoxemia: Secondary | ICD-10-CM | POA: Insufficient documentation

## 2021-06-10 DIAGNOSIS — N179 Acute kidney failure, unspecified: Secondary | ICD-10-CM

## 2021-06-10 DIAGNOSIS — Z794 Long term (current) use of insulin: Secondary | ICD-10-CM | POA: Insufficient documentation

## 2021-06-10 DIAGNOSIS — T8744 Infection of amputation stump, left lower extremity: Principal | ICD-10-CM | POA: Diagnosis present

## 2021-06-10 DIAGNOSIS — Z8673 Personal history of transient ischemic attack (TIA), and cerebral infarction without residual deficits: Secondary | ICD-10-CM | POA: Insufficient documentation

## 2021-06-10 DIAGNOSIS — E871 Hypo-osmolality and hyponatremia: Secondary | ICD-10-CM | POA: Insufficient documentation

## 2021-06-10 DIAGNOSIS — Y835 Amputation of limb(s) as the cause of abnormal reaction of the patient, or of later complication, without mention of misadventure at the time of the procedure: Secondary | ICD-10-CM | POA: Insufficient documentation

## 2021-06-10 DIAGNOSIS — G4733 Obstructive sleep apnea (adult) (pediatric): Secondary | ICD-10-CM

## 2021-06-10 DIAGNOSIS — N289 Disorder of kidney and ureter, unspecified: Secondary | ICD-10-CM

## 2021-06-10 LAB — URINALYSIS, ROUTINE W REFLEX MICROSCOPIC
Bilirubin Urine: NEGATIVE
Glucose, UA: >=500 mg/dL — AB
Hgb urine dipstick: NEGATIVE
Ketones, ur: 5 mg/dL — AB
Leukocytes,Ua: NEGATIVE
Nitrite: NEGATIVE
Protein, ur: NEGATIVE mg/dL
Specific Gravity, Urine: 1.03 (ref 1.005–1.030)
pH: 6 (ref 5.0–8.0)

## 2021-06-10 LAB — CBC WITH DIFFERENTIAL/PLATELET
Abs Immature Granulocytes: 0.07 10*3/uL (ref 0.00–0.07)
Basophils Absolute: 0.1 10*3/uL (ref 0.0–0.1)
Basophils Relative: 1 %
Eosinophils Absolute: 0 10*3/uL (ref 0.0–0.5)
Eosinophils Relative: 0 %
HCT: 37.8 % — ABNORMAL LOW (ref 39.0–52.0)
Hemoglobin: 12.8 g/dL — ABNORMAL LOW (ref 13.0–17.0)
Immature Granulocytes: 1 %
Lymphocytes Relative: 6 %
Lymphs Abs: 0.9 10*3/uL (ref 0.7–4.0)
MCH: 32.7 pg (ref 26.0–34.0)
MCHC: 33.9 g/dL (ref 30.0–36.0)
MCV: 96.7 fL (ref 80.0–100.0)
Monocytes Absolute: 1.3 10*3/uL — ABNORMAL HIGH (ref 0.1–1.0)
Monocytes Relative: 9 %
Neutro Abs: 11.6 10*3/uL — ABNORMAL HIGH (ref 1.7–7.7)
Neutrophils Relative %: 83 %
Platelets: 247 10*3/uL (ref 150–400)
RBC: 3.91 MIL/uL — ABNORMAL LOW (ref 4.22–5.81)
RDW: 12.9 % (ref 11.5–15.5)
WBC: 13.9 10*3/uL — ABNORMAL HIGH (ref 4.0–10.5)
nRBC: 0 % (ref 0.0–0.2)

## 2021-06-10 LAB — PROTIME-INR
INR: 1 (ref 0.8–1.2)
Prothrombin Time: 12.9 seconds (ref 11.4–15.2)

## 2021-06-10 LAB — COMPREHENSIVE METABOLIC PANEL
ALT: 13 U/L (ref 0–44)
AST: 17 U/L (ref 15–41)
Albumin: 3.6 g/dL (ref 3.5–5.0)
Alkaline Phosphatase: 73 U/L (ref 38–126)
Anion gap: 13 (ref 5–15)
BUN: 24 mg/dL — ABNORMAL HIGH (ref 8–23)
CO2: 24 mmol/L (ref 22–32)
Calcium: 9.3 mg/dL (ref 8.9–10.3)
Chloride: 95 mmol/L — ABNORMAL LOW (ref 98–111)
Creatinine, Ser: 1.7 mg/dL — ABNORMAL HIGH (ref 0.61–1.24)
GFR, Estimated: 45 mL/min — ABNORMAL LOW (ref >60)
Glucose, Bld: 317 mg/dL — ABNORMAL HIGH (ref 70–99)
Potassium: 4.6 mmol/L (ref 3.5–5.1)
Sodium: 132 mmol/L — ABNORMAL LOW (ref 135–145)
Total Bilirubin: 0.8 mg/dL (ref 0.3–1.2)
Total Protein: 7.5 g/dL (ref 6.5–8.1)

## 2021-06-10 LAB — GLUCOSE, CAPILLARY
Glucose-Capillary: 173 mg/dL — ABNORMAL HIGH (ref 70–99)
Glucose-Capillary: 52 mg/dL — ABNORMAL LOW (ref 70–99)
Glucose-Capillary: 58 mg/dL — ABNORMAL LOW (ref 70–99)
Glucose-Capillary: 211 mg/dL — ABNORMAL HIGH (ref 70–99)

## 2021-06-10 LAB — HIV ANTIBODY (ROUTINE TESTING W REFLEX): HIV Screen 4th Generation wRfx: NONREACTIVE

## 2021-06-10 LAB — CBG MONITORING, ED: Glucose-Capillary: 308 mg/dL — ABNORMAL HIGH (ref 70–99)

## 2021-06-10 LAB — C-REACTIVE PROTEIN: CRP: 12.9 mg/dL — ABNORMAL HIGH (ref <1.0)

## 2021-06-10 LAB — APTT: aPTT: 31 seconds (ref 24–36)

## 2021-06-10 LAB — LACTIC ACID, PLASMA
Lactic Acid, Venous: 1.4 mmol/L (ref 0.5–1.9)
Lactic Acid, Venous: 1 mmol/L (ref 0.5–1.9)

## 2021-06-10 LAB — SEDIMENTATION RATE: Sed Rate: 40 mm/hr — ABNORMAL HIGH (ref 0–16)

## 2021-06-10 LAB — D-DIMER, QUANTITATIVE: D-Dimer, Quant: 0.92 ug/mL-FEU — ABNORMAL HIGH (ref 0.00–0.50)

## 2021-06-10 MED ORDER — PANCRELIPASE (LIP-PROT-AMYL) 25000-79000 UNITS PO CPEP
1.0000 | ORAL_CAPSULE | ORAL | Status: DC
Start: 2021-06-10 — End: 2021-06-10

## 2021-06-10 MED ORDER — INSULIN ASPART 100 UNIT/ML IJ SOLN
0.0000 [IU] | Freq: Three times a day (TID) | INTRAMUSCULAR | Status: DC
  Administered 2021-06-10 – 2021-06-11 (×2): 5 [IU] via SUBCUTANEOUS
  Administered 2021-06-11: 3 [IU] via SUBCUTANEOUS
  Administered 2021-06-11: 5 [IU] via SUBCUTANEOUS
  Administered 2021-06-12 (×2): 2 [IU] via SUBCUTANEOUS
  Administered 2021-06-12: 3 [IU] via SUBCUTANEOUS
  Administered 2021-06-13: 8 [IU] via SUBCUTANEOUS
  Administered 2021-06-13 – 2021-06-14 (×4): 5 [IU] via SUBCUTANEOUS
  Administered 2021-06-14: 8 [IU] via SUBCUTANEOUS
  Administered 2021-06-15: 11 [IU] via SUBCUTANEOUS
  Administered 2021-06-15: 8 [IU] via SUBCUTANEOUS
  Administered 2021-06-15: 3 [IU] via SUBCUTANEOUS
  Administered 2021-06-16: 11 [IU] via SUBCUTANEOUS
  Administered 2021-06-16: 5 [IU] via SUBCUTANEOUS
  Administered 2021-06-16: 3 [IU] via SUBCUTANEOUS
  Administered 2021-06-17: 15 [IU] via SUBCUTANEOUS
  Administered 2021-06-17: 8 [IU] via SUBCUTANEOUS

## 2021-06-10 MED ORDER — DEXTROSE 50 % IV SOLN
INTRAVENOUS | Status: AC
Start: 2021-06-10 — End: 2021-06-10
  Administered 2021-06-10: 50 mL
  Filled 2021-06-10: qty 50

## 2021-06-10 MED ORDER — INSULIN DETEMIR 100 UNIT/ML ~~LOC~~ SOLN
54.0000 [IU] | Freq: Every day | SUBCUTANEOUS | Status: DC
  Filled 2021-06-10: qty 0.54

## 2021-06-10 MED ORDER — PRAMIPEXOLE DIHYDROCHLORIDE 0.125 MG PO TABS
0.1250 mg | ORAL_TABLET | Freq: Every day | ORAL | Status: DC
  Administered 2021-06-10 – 2021-06-16 (×7): 0.125 mg via ORAL
  Filled 2021-06-10 (×7): qty 1

## 2021-06-10 MED ORDER — SODIUM CHLORIDE 0.9 % IV SOLN
2.0000 g | INTRAVENOUS | Status: DC
  Administered 2021-06-10 – 2021-06-12 (×3): 2 g via INTRAVENOUS
  Filled 2021-06-10 (×3): qty 20

## 2021-06-10 MED ORDER — VITAMIN E 45 MG (100 UNIT) PO CAPS
1000.0000 [IU] | ORAL_CAPSULE | Freq: Every day | ORAL | Status: DC
  Administered 2021-06-11 – 2021-06-17 (×6): 1000 [IU] via ORAL
  Filled 2021-06-10 (×8): qty 10

## 2021-06-10 MED ORDER — CLOPIDOGREL BISULFATE 75 MG PO TABS
75.0000 mg | ORAL_TABLET | Freq: Every day | ORAL | Status: DC
  Administered 2021-06-10 – 2021-06-17 (×8): 75 mg via ORAL
  Filled 2021-06-10 (×8): qty 1

## 2021-06-10 MED ORDER — PANCRELIPASE (LIP-PROT-AMYL) 12000-38000 UNITS PO CPEP
24000.0000 [IU] | ORAL_CAPSULE | Freq: Three times a day (TID) | ORAL | Status: DC | PRN
  Administered 2021-06-14: 24000 [IU] via ORAL
  Filled 2021-06-10: qty 2

## 2021-06-10 MED ORDER — QUETIAPINE FUMARATE 50 MG PO TABS
50.0000 mg | ORAL_TABLET | Freq: Every day | ORAL | Status: DC
  Administered 2021-06-10 – 2021-06-16 (×7): 50 mg via ORAL
  Filled 2021-06-10 (×7): qty 1

## 2021-06-10 MED ORDER — LISINOPRIL 20 MG PO TABS: 20.0000 mg | ORAL_TABLET | Freq: Every day | ORAL | Status: DC

## 2021-06-10 MED ORDER — INSULIN DETEMIR 100 UNIT/ML ~~LOC~~ SOLN
25.0000 [IU] | Freq: Every day | SUBCUTANEOUS | Status: DC
  Administered 2021-06-11 – 2021-06-12 (×3): 25 [IU] via SUBCUTANEOUS
  Filled 2021-06-10 (×4): qty 0.25

## 2021-06-10 MED ORDER — FERROUS SULFATE 325 (65 FE) MG PO TABS
325.0000 mg | ORAL_TABLET | Freq: Every day | ORAL | Status: DC
  Administered 2021-06-11 – 2021-06-17 (×7): 325 mg via ORAL
  Filled 2021-06-10 (×7): qty 1

## 2021-06-10 MED ORDER — INSULIN ASPART 100 UNIT/ML IJ SOLN
0.0000 [IU] | Freq: Every day | INTRAMUSCULAR | Status: DC
  Administered 2021-06-12: 2 [IU] via SUBCUTANEOUS

## 2021-06-10 MED ORDER — INSULIN DETEMIR 100 UNIT/ML ~~LOC~~ SOLN: 25.0000 [IU] | Freq: Every day | SUBCUTANEOUS | Status: DC

## 2021-06-10 MED ORDER — ENOXAPARIN SODIUM 40 MG/0.4ML IJ SOSY
40.0000 mg | PREFILLED_SYRINGE | INTRAMUSCULAR | Status: DC
  Administered 2021-06-10 – 2021-06-17 (×8): 40 mg via SUBCUTANEOUS
  Filled 2021-06-10 (×8): qty 0.4

## 2021-06-10 MED ORDER — BISACODYL 5 MG PO TBEC: 5.0000 mg | DELAYED_RELEASE_TABLET | Freq: Every day | ORAL | Status: DC | PRN

## 2021-06-10 MED ORDER — PANTOPRAZOLE SODIUM 40 MG PO TBEC
40.0000 mg | DELAYED_RELEASE_TABLET | Freq: Every day | ORAL | Status: DC
  Administered 2021-06-10 – 2021-06-17 (×8): 40 mg via ORAL
  Filled 2021-06-10 (×8): qty 1

## 2021-06-10 MED ORDER — CEFAZOLIN SODIUM-DEXTROSE 2-4 GM/100ML-% IV SOLN
2.0000 g | Freq: Once | INTRAVENOUS | Status: AC
  Administered 2021-06-10: 2 g via INTRAVENOUS
  Filled 2021-06-10: qty 100

## 2021-06-10 MED ORDER — INSULIN ASPART 100 UNIT/ML IJ SOLN
8.0000 [IU] | Freq: Three times a day (TID) | INTRAMUSCULAR | Status: DC
  Administered 2021-06-10 – 2021-06-13 (×8): 8 [IU] via SUBCUTANEOUS

## 2021-06-10 MED ORDER — ACETAMINOPHEN 325 MG PO TABS: 650.0000 mg | ORAL_TABLET | Freq: Four times a day (QID) | ORAL | Status: DC | PRN

## 2021-06-10 MED ORDER — METRONIDAZOLE 500 MG PO TABS
500.0000 mg | ORAL_TABLET | Freq: Three times a day (TID) | ORAL | Status: DC
  Administered 2021-06-10 – 2021-06-14 (×12): 500 mg via ORAL
  Filled 2021-06-10 (×13): qty 1

## 2021-06-10 MED ORDER — GADOBUTROL 1 MMOL/ML IV SOLN
10.0000 mL | Freq: Once | INTRAVENOUS | Status: AC | PRN
  Administered 2021-06-10: 10 mL via INTRAVENOUS

## 2021-06-10 MED ORDER — INSULIN ASPART 100 UNIT/ML IJ SOLN: 0.0000 [IU] | Freq: Every day | INTRAMUSCULAR | Status: DC

## 2021-06-10 MED ORDER — VENLAFAXINE HCL ER 150 MG PO CP24
150.0000 mg | ORAL_CAPSULE | Freq: Every day | ORAL | Status: DC
  Administered 2021-06-10 – 2021-06-17 (×8): 150 mg via ORAL
  Filled 2021-06-10 (×8): qty 1

## 2021-06-10 MED ORDER — LACTATED RINGERS IV SOLN
INTRAVENOUS | Status: AC
Start: 2021-06-10 — End: 2021-06-10

## 2021-06-10 MED ORDER — INSULIN ASPART 100 UNIT/ML IJ SOLN: 0.0000 [IU] | Freq: Three times a day (TID) | INTRAMUSCULAR | Status: DC

## 2021-06-10 MED ORDER — PANCRELIPASE (LIP-PROT-AMYL) 36000-114000 UNITS PO CPEP
48000.0000 [IU] | ORAL_CAPSULE | Freq: Three times a day (TID) | ORAL | Status: DC
  Administered 2021-06-10 – 2021-06-17 (×21): 48000 [IU] via ORAL
  Filled 2021-06-10 (×22): qty 1

## 2021-06-10 MED ORDER — IOHEXOL 350 MG/ML SOLN
60.0000 mL | Freq: Once | INTRAVENOUS | Status: AC | PRN
  Administered 2021-06-10: 60 mL via INTRAVENOUS

## 2021-06-10 MED ORDER — AMITRIPTYLINE HCL 25 MG PO TABS
25.0000 mg | ORAL_TABLET | Freq: Every day | ORAL | Status: DC
  Administered 2021-06-10 – 2021-06-16 (×7): 25 mg via ORAL
  Filled 2021-06-10 (×7): qty 1

## 2021-06-10 MED ORDER — ACETAMINOPHEN 650 MG RE SUPP: 650.0000 mg | Freq: Four times a day (QID) | RECTAL | Status: DC | PRN

## 2021-06-10 MED ORDER — SENNOSIDES-DOCUSATE SODIUM 8.6-50 MG PO TABS: 1.0000 | ORAL_TABLET | Freq: Every evening | ORAL | Status: DC | PRN

## 2021-06-10 NOTE — ED Notes (Signed)
Patient transported to MRI 

## 2021-06-10 NOTE — ED Notes (Signed)
CBG 308 

## 2021-06-10 NOTE — ED Provider Notes (Signed)
?[facility] ?Provider Note ? ? ?CSN: [id number] ?Arrival date & time: 06/10/21  [id number] ? ?  ? ?History ? ?Chief Complaint  ?Patient presents with  ? Hyperglycemia  ? ? ?Thomas Frye is a 66 y.o. male. ? ?The history is provided by the patient and a relative.  ?Hyperglycemia ?He has history of hypertension, diabetes, hyperlipidemia, left below the knee amputation and comes in because of some bleeding from the stump of his left leg which started yesterday.  He denies any pain in his leg and denies any trauma.  EMS arrived and noted he was hypoxic with oxygen saturation of 80-85% and he was placed on supplemental oxygen.  CBG was noted to be high at home.  He denies feeling short of breath denies chest pain, heaviness, tightness, pressure.  He denies any cough.  He has never been oxygen dependent in the past.  He is a non-smoker. ?  ?Home Medications ?Prior to Admission medications   ?Medication Sig Start Date End Date Taking? Authorizing Provider  ?doxazosin (CARDURA) 1 MG tablet Take 0.5 tablets (0.5 mg total) by mouth daily. 10/08/15   Geradine Girt, DO  ?insulin aspart (NOVOLOG) 100 UNIT/ML injection CHECK YOUR CBG 3 TIMES A DAY WITH MEALS AND USE A SLIDING SCALE AS FOLLOWS: ?For CBG 121-150 inject 1unit of insulin ?For CBG 151-200 inject 2units of insulin ?For CBG 201-250 inject 3units of insulin  ?For CBG 251-300 inject 5units of insulin ?For CBG 301-350 inject 7units of insulin ?For CBG 351-400 inject 9units of insulin ?For CBG >400 call your MD 12/15/12   Cherene Altes, MD  ?insulin glargine (LANTUS) 100 UNIT/ML injection Inject 40 Units into the skin 2 (two) times daily. Patient states he is taking this medication morning and night    [provider]  ?loperamide (IMODIUM) 2 MG capsule Take by mouth as needed for diarrhea or loose stools.    [provider]  ?meloxicam (MOBIC) 15 MG tablet Take 15 mg by mouth daily.    [provider]   ?metoCLOPramide (REGLAN) 5 MG tablet Take 1 tablet (5 mg total) by mouth 4 (four) times daily. 10/08/15   Geradine Girt, DO  ?metroNIDAZOLE (FLAGYL) 500 MG tablet Take 500 mg by mouth See admin instructions. Take every three days, last dose was on 10-03-15    [provider]  ?omeprazole (PRILOSEC) 40 MG capsule Take 40 mg by mouth 2 (two) times daily.    [provider]  ?Pancrelipase, Lip-Prot-Amyl, (ZENPEP PO) Take 1 tablet by mouth See admin instructions. Take 3 tablets by mouth with every meal. Then take 2 tablets by mouth with every snacks. Per wife and patient    [provider]  ?pravastatin (PRAVACHOL) 40 MG tablet Take 40 mg by mouth daily.    [provider]  ?Probiotic Product (PROBIOTIC PO) Take 1 tablet by mouth daily.    [provider]  ?   ? ?Allergies    ?Latex and Sulfa antibiotics   ? ?Review of Systems   ?Review of Systems  ?All other systems reviewed and are negative. ? ?Physical Exam ?Updated Vital Signs ?BP 125/71   Pulse 95   Temp 98.7 ?F (37.1 ?C)   Resp 12   Ht 6\' 3"  (1.905 m)   Wt 136.1 kg   SpO2 90%   BMI 37.50 kg/m?  ?Physical Exam ?Vitals and nursing note reviewed.  ?66 year old male, resting comfortably and in no acute  distress. Vital signs are normal. Oxygen saturation is 90%, which is borderline. ?Head is normocephalic and atraumatic. PERRLA, EOMI. Oropharynx is clear. ?Neck is nontender and supple without adenopathy or JVD. ?Back is nontender and there is no CVA tenderness. ?Lungs are clear without rales, wheezes, or rhonchi. ?Chest is nontender. ?Heart has regular rate and rhythm without murmur. ?Abdomen is soft, flat, nontender. ?Extremities: Status post left below the knee amputation.  Chronic venous stasis changes present on the right lower leg.  The surgical incision for the stump of the left leg is well-healed, but there is a separate laceration which appears subacute to chronic proximal to that there is surgical incision  and is oriented longitudinally.  There is slight drainage from a but is not obviously bleeding.  The stump is warm compared with the contralateral leg, but sensation is normal.  Mild erythema is noted.Marland Kitchen ?Skin is warm and dry without rash. ?Neurologic: Mental status is normal, cranial nerves are intact, moves all extremities equally. ? ?ED Results / Procedures / Treatments   ?Labs ?(all labs ordered are listed, but only abnormal results are displayed) ?Labs Reviewed  ?COMPREHENSIVE METABOLIC PANEL - Abnormal; Notable for the following components:  ?    Result Value  ? Sodium 132 (*)   ? Chloride 95 (*)   ? Glucose, Bld 317 (*)   ? BUN 24 (*)   ? Creatinine, Ser 1.70 (*)   ? GFR, Estimated 45 (*)   ? All other components within normal limits  ?CBC WITH DIFFERENTIAL/PLATELET - Abnormal; Notable for the following components:  ? WBC 13.9 (*)   ? RBC 3.91 (*)   ? Hemoglobin 12.8 (*)   ? HCT 37.8 (*)   ? Neutro Abs 11.6 (*)   ? Monocytes Absolute 1.3 (*)   ? All other components within normal limits  ?D-DIMER, QUANTITATIVE - Abnormal; Notable for the following components:  ? D-Dimer, Quant 0.92 (*)   ? All other components within normal limits  ?CBG MONITORING, ED - Abnormal; Notable for the following components:  ? Glucose-Capillary 308 (*)   ? All other components within normal limits  ?CULTURE, BLOOD (ROUTINE X 2)  ?CULTURE, BLOOD (ROUTINE X 2)  ?AEROBIC CULTURE W GRAM STAIN (SUPERFICIAL SPECIMEN)  ?LACTIC ACID, PLASMA  ?PROTIME-INR  ?APTT  ?LACTIC ACID, PLASMA  ?URINALYSIS, ROUTINE W REFLEX MICROSCOPIC  ? ? ?EKG ?ED ECG REPORT ? ? Date: 06/10/2021 ? Rate: 98 ? Rhythm: normal sinus rhythm ? QRS Axis: normal ? Intervals: normal ? ST/T Wave abnormalities: normal ? Conduction Disutrbances:none ? Narrative Interpretation: Normal sinus rhythm.  Early transition of precordial leads.  When compared with ECG of 01/20/2021, no significant changes are seen. ? Old EKG Reviewed: unchanged ? ?I have personally reviewed the EKG  tracing and agree with the computerized printout as noted. ? ?Radiology ?DG Chest Port 1 View ? ?Result Date: 06/10/2021 ?CLINICAL DATA:  66 year old male with possible sepsis. EXAM: PORTABLE CHEST 1 VIEW COMPARISON:  Portable chest 01/20/2021. FINDINGS: Portable AP semi upright view at 0542 hours. Linear metallic object projecting over the left mediastinum at the AP window, also present at the left chest last year and probably a cardiac loop recorder or similar device. Low lung volumes, lower than last year. Normal cardiac size and mediastinal contours. Visualized tracheal air column is within normal limits. Allowing for portable technique the lungs are clear. No pneumothorax. No acute osseous abnormality identified. Negative visible bowel gas. IMPRESSION: Low lung volumes.  No acute cardiopulmonary abnormality. Electronically  Signed   By: Genevie Ann M.D.   On: 06/10/2021 06:42   ? ?Procedures ?Procedures  ? ? ?Medications Ordered in ED ?Medications  ?ceFAZolin (ANCEF) IVPB 2g/100 mL premix (2 g Intravenous New Bag/Given 06/10/21 0723)  ? ? ?ED Course/ Medical Decision Making/ A&P ?  ?                        ?Medical Decision Making ?Amount and/or Complexity of Data Reviewed ?Labs: ordered. ?Radiology: ordered. ? ?Risk ?Prescription drug management. ? ? ?Bleeding site from the stump of the left leg appears to be in subacute to chronic laceration.  Erythema and warmth is concerning for soft tissue infection.  Hypoxia is unexplained, will check chest x-ray.  With unexplained hypoxia, will also check D-dimer to rule out pulmonary embolism.  In the meantime, we will also evaluate for possible sepsis and he is placed on the evolving sepsis pathway.  Blood and wound cultures are obtained.  We will check chest x-ray to rule out occult pneumonia and heart failure.  CBG is slightly over 300, no emergent treatment of hyperglycemia is indicated.  Old records are reviewed, and he has had no relevant past visits, no prior issues  with his amputation. ? ?Chest x-ray shows no acute process.  I have independently viewed the image, and agree with radiologist interpretation.  Lactic acid level has come back normal, but WBC is slightly elevated with a

## 2021-06-10 NOTE — ED Notes (Signed)
ED Provider at bedside. 

## 2021-06-10 NOTE — ED Notes (Signed)
Blood cultures collected by Cailtin RN from previous shift  ?

## 2021-06-10 NOTE — ED Provider Notes (Signed)
Patient initially seen by Dr. Preston Fleeting.  Please see his note.  CT scan was pending at the time of shift change, no signs of PE or other acute abnormality noted on CT scan. ? ?Patient does have evidence of cellulitis and wound infection.  At risk for complications considering his age comorbidities diabetes.  I will consult with medical service for admission ? ?Dr Barbaraann Faster , IM service evaluating pt in the ED for admission ?  ?Linwood Dibbles, MD ?06/10/21 1021 ? ?

## 2021-06-10 NOTE — ED Triage Notes (Addendum)
Pt BIB EMS from home with c/o hyperglycemia and a laceration to his left BKA. pt's O2 was 85% on RA, EMS placed pt on 6lpm [place]. Pt 97% on RA. Pt takes plavix ?

## 2021-06-10 NOTE — ED Notes (Signed)
Pt's O2 decreased to 86% on RA. Pt placed on 2lpm [place], O2 increased to 92%. ?

## 2021-06-10 NOTE — Progress Notes (Signed)
Inpatient Diabetes Program Recommendations ? ?AACE/ADA: New Consensus Statement on Inpatient Glycemic Control (2015) ? ?Target Ranges:  Prepandial:   less than 140 mg/dL ?     Peak postprandial:   less than 180 mg/dL (1-2 hours) ?     Critically ill patients:  140 - 180 mg/dL  ? ?Lab Results  ?Component Value Date  ? GLUCAP 308 (H) 06/10/2021  ? HGBA1C 8.3 (H) 10/07/2015  ? ? ?Review of Glycemic Control ? Latest Reference Range & Units 06/10/21 05:16  ?Glucose-Capillary 70 - 99 mg/dL 308 (H)  ? ?Diabetes history: DM type 2 ?Outpatient Diabetes medications: Omnipod insulin pump with Novolin R insulin (uses pump for correction and meal coverage only), Freestyle libre 2 CGM, Tresiba 54 units ?Insulin pump settings: ?No basal rates ?Sensitivity 1 unit of insulin drops glucose 20 points ?Carb coverage 1 unit of insulin for every 4 grams of carbs ?Target glucose 100-120 ?Current orders for Inpatient glycemic control:  ?Levemir 54 units qhs ?Novolog 0-15 units tid + hs ?Novolog 8 units tid meal coverage tid ? ?Spoke with pt and wife at bedside regarding insulin pump settings. Expressed the desire for them to take the pump off and let the MD manage glucose trends with SQ insulin due to the infection driving glucose trends up from baseline. Pt and wife agreeable. Discussed with them to remove insulin pump and take it home.  ? ?Anticipate glucose needs will require an increase in insulin tomorrow. ? ?Thanks, ? ?Tama Headings RN, MSN, BC-ADM ?Inpatient Diabetes Coordinator ?Team Pager [phone] (8a-5p) ? ? ?

## 2021-06-10 NOTE — ED Notes (Signed)
Admit MD at bedside

## 2021-06-10 NOTE — H&P (Addendum)
?Date: 06/10/2021     ?     ?     ?Patient Name:  Thomas Frye MRN: [id number]  ?DOB: 05-Nov-1955 Age / Sex: 66 y.o., male   ?PCP: [name], MD    ?     ?Medical Service: Internal Medicine Teaching Service    ?     ?Attending Physician: Dr. Heber Greenup, Rachel Moulds, DO    ?First Contact: Tamsen Snider, MD Pager: Cherly Hensen [phone]  ?     ?     ?After Hours (After 5p/  First Contact Pager: [phone]  ?weekends / holidays): Second Contact Pager: [phone]  ? ?SUBJECTIVE  ? ?Chief Complaint: left stump wound ? 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 Started 2 days ago and patient reports some rubbing of his prosthesis.  In addition he said he had a gentle fall out of bed this morning landing on the left stump.  He denies any pain after the fall.  His daughter was there and called her brother to come and help.  They called EMS and was found to be hypoxic with oxygen saturation of 80 to 85%.  He does not wear oxygen at home but was previously prescribed a CPAP for OSA.  He has an evaluation for his memory since his recent stroke.   ? ? ?Review of Systems  ?Constitutional:  Negative for chills and fever.  ?HENT:  Negative for congestion and sinus pain.   ?Eyes:  Negative for pain and discharge.  ?Respiratory:  Negative for cough and shortness of breath.   ?Cardiovascular:  Negative for chest pain, orthopnea and leg swelling.  ?Gastrointestinal:  Positive for heartburn. Negative for abdominal pain, nausea and vomiting.  ?Genitourinary:  Negative for frequency and urgency.  ?Musculoskeletal:  Positive for falls. Negative for joint pain.   ?Skin:  Negative for itching and rash.  ?Neurological:  Negative for focal weakness and loss of consciousness.  ?Endo/Heme/Allergies:  Positive for polydipsia.  ?Psychiatric/Behavioral:  Negative for depression. The patient is not nervous/anxious.   ? ?ED Course: ? ?Meds: ?Current Meds  ?Medication Sig  ? acetaminophen (TYLENOL) 325 MG tablet Take 650 mg by mouth every 6 (six) hours as needed for moderate pain.  ? amitriptyline (ELAVIL) 25 MG tablet Take 25 mg by mouth at bedtime.  ? atorvastatin (LIPITOR) 10 MG tablet Take 10 mg by mouth at bedtime.  ? clopidogrel (PLAVIX) 75 MG tablet Take 75 mg by mouth daily.  ? Continuous Blood Gluc Receiver (FREESTYLE LIBRE 14 DAY READER) DEVI by Does not apply route.  ? ferrous sulfate 325 (65 FE) MG tablet Take 325 mg by mouth daily with breakfast.  ? insulin degludec (TRESIBA FLEXTOUCH) 100 UNIT/ML FlexTouch Pen Inject 60 Units into the skin daily.  ? Insulin Disposable Pump (OMNIPOD DASH PODS, GEN 4,) MISC USE 1 POD EVERY 2 DAYS AS NEEDED  ? Insulin Disposable Pump (OMNIPOD DASH PODS, GEN 4,) MISC Inject into the skin.  ? insulin regular (NOVOLIN R) 100 units/mL injection  ?follow insulin pump, Soln, Subcutaneous, As Indicated, 10 mL, 0 Refill(s), Route to Pharmacy Electronically, [facility], [facility], 06/20/20 6:38:00 EDT, Height/Length Dosing, cm, 140.6, 06/20/20 6:38:00 EDT, Weight Dosing, kg  ? lisinopril (ZESTRIL) 20 MG tablet Take 20 mg by mouth daily.  ? loperamide (IMODIUM) 2 MG capsule Take by mouth as needed for diarrhea or loose stools.  ? omeprazole (PRILOSEC) 40 MG capsule Take 40 mg by mouth 2 (two) times daily.  ? Pancrelipase, Lip-Prot-Amyl, 25000-79000 units CPEP Take 1-2 capsules by mouth See admin instructions. 2 capsules  with meals  ?1 capsule with snacks  ? pramipexole (MIRAPEX) 0.125 MG tablet Take 0.125 mg by mouth at bedtime.  ? Probiotic Product (PROBIOTIC PO) Take 1 tablet by mouth daily.  ? QUEtiapine (SEROQUEL) 50 MG tablet  Take 50 mg by mouth at bedtime.  ? venlafaxine XR (EFFEXOR-XR) 150 MG 24 hr capsule Take 150 mg by mouth daily with breakfast.  ? vitamin E 1000 UNIT capsule Take 1,000 Units by mouth in the morning and at bedtime.  ? [DISCONTINUED] Glucagon HCl, rDNA, (GLUCAGEN IJ) Inject as directed.  ? [DISCONTINUED] Insulin Disposable Pump (OMNIPOD DASH PODS, GEN 4,) MISC USE 1 POD EVERY 2 DAYS AS NEEDED  ? ? ?Past Medical History:  ?Diagnosis Date  ? ARF (acute renal failure) ([place]) 09/2015  ? Cerebrovascular disease   ? Right thalamic, left parietal white matter strokes  ? Depression   ? Diabetes mellitus   ? Diabetic peripheral neuropathy ([place])   ? DKA (diabetic ketoacidoses) 09/2015  ? Gait disturbance   ? GERD (gastroesophageal reflux disease)   ? H/O hiatal hernia   ? Left foot drop 10/05/2012  ? Neuromuscular disorder ([place])   ? neuropathy  ? 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.2) 10/05/2012  ? Sciatic neuropathy   ? Left-sided nerve  ? Sleep apnea   ? wears cpap at night  ? ? ?Past Surgical History:  ?Procedure Laterality Date  ? EYE SURGERY    ? FRACTURE SURGERY    ? Questionable steel screws to right tibia  ? TOE AMPUTATION  2008  ? ? ?Social:  ?Lives With: His wife and daughter.His son is bedside with him and mentions he has concerns his father needs a lot of care.  Patient says he performs his own ADLs and IADLs . Son disagrees. He adds his father and mother are possibly going through a separation and he mentions patient is having a hard time accepting this.  Neither him or his sister can provide full-time care. ?Occupation: Patient is disabled ?PCP: [name], MD at [facility].  Reports she is leaving but he is going to stay at the practice. ?Substances: ?Social History  ? ?Tobacco Use  ? Smoking status: Never  ? Smokeless tobacco: Never  ?Vaping Use  ? Vaping Use: Never used  ?Substance Use Topics  ? Alcohol use: Not Currently  ?  Alcohol/week: 1.0 standard drink  ?  Types: 1 Glasses of wine per week  ? Drug use:  No  ? ? ? ?Family History:  ?Family History  ?Problem Relation Age of Onset  ? Breast cancer Mother   ? Melanoma Mother   ? Diabetes Mother   ? Diabetes Brother   ? Diabetes Maternal Grandmother   ? ? ? ?Allergies: ?Allergies as of 06/10/2021 - Review Complete 06/10/2021  ?Allergen Reaction Noted  ? Latex Swelling 10/13/2010  ? Sulfa antibiotics Hives 10/13/2010  ? ? ?Review of Systems: ?A complete ROS was negative except as per HPI.  ? ?OBJECTIVE:  ? ?Physical Exam: ?Blood pressure 132/85, pulse 94, temperature 98.7 ?F (  37.1 ?C), resp. rate 14, height 6\' 3"  (1.905 m), weight 136.1 kg, SpO2 99 %.  ?Constitutional: Chronically ill-appearing, severe obese man ?HENT: normocephalic atraumatic, mucous membranes dry  ?Eyes: conjunctiva non-erythematous, sclera and dietary ?Neck: supple ?Cardiovascular: regular rate and rhythm, no m/r/g ?Pulmonary/Chest: normal work of breathing on room air, lungs clear to auscultation bilaterally, no wheezing, maintain sats 95% on room air ?Abdominal: soft, non-tender, nl bowel sounds ?MSK: normal bulk and tone left stump is erythematous and warm with a 2 cm sinus tract which extends proximal and medial serosanguineous drainage, no active bleeding.  I cannot palpate bone. ?Neurological: alert & oriented x 3, 5/5 strength in bilateral upper and lower extremities ?Skin: warm and dry, normal skin turgor ? ? ?Psych: Normal mood, normal affect ? ?Labs: ?CBC ?   ?Component Value Date/Time  ? WBC 13.9 (H) 06/10/2021 0517  ? RBC 3.91 (L) 06/10/2021 0517  ? HGB 12.8 (L) 06/10/2021 0517  ? HCT 37.8 (L) 06/10/2021 0517  ? PLT 247 06/10/2021 0517  ? MCV 96.7 06/10/2021 0517  ? MCH 32.7 06/10/2021 0517  ? MCHC 33.9 06/10/2021 0517  ? RDW 12.9 06/10/2021 0517  ? LYMPHSABS 0.9 06/10/2021 0517  ? MONOABS 1.3 (H) 06/10/2021 0517  ? EOSABS 0.0 06/10/2021 0517  ? BASOSABS 0.1 06/10/2021 0517  ?  ? ?CMP  ?   ?Component Value Date/Time  ? NA 132 (L) 06/10/2021 0517  ? K 4.6 06/10/2021 0517  ? CL 95 (L)  06/10/2021 0517  ? CO2 24 06/10/2021 0517  ? GLUCOSE 317 (H) 06/10/2021 0517  ? BUN 24 (H) 06/10/2021 0517  ? CREATININE 1.70 (H) 06/10/2021 0517  ? CALCIUM 9.3 06/10/2021 0517  ? PROT 7.5 06/10/2021 0517  ? ALBUM

## 2021-06-10 NOTE — Hospital Course (Addendum)
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 ESR and CRP were elevated but patient was hemodynamically stable without fever or leukocytosis. MRI showed open wound involving the distal aspect of the left lower extremity anteriorly near the amputation site, associated cellulitis and myofasciitis, no discrete rim enhancing abscess is identified; osteomyelitis involving the tibia near the amputation site; no findings for septic arthritis at the knee joint. Wound culture showed pansensitive staph aureus and blood culture showed no growth. He was started on ceftriaxone and metronidazole on admission and vancomycin was added 03/17. Vancomycin was changed to doxycycline 03/18 and metronidazole was continued. Antibiotic regimen was changed further to Keflex 03/21. He was hemodynamically stable without fever or leukocytosis at time of discharge. ? ?DM with complications of retinopathy, neuropathy, and PVD  ?Patient was hyperglycemic on presentation. He was started on Levemir nightly and mealtime insulin with sliding scale coverage with nighttime correction, and this was slowly titrated up over the course of admission. He did have a few hypoglycemic episodes which corrected with food and juice. ?  ?AKI, resolved ?Admission labs showed creatinine of 1.70, increased from baseline of around 1.0-1.10. Home lisinopril was held on admission and restarted with resolution of AKI. ?  ?OSA ?CPAP at night. ?  ?Pancreatic insufficiency ?Continued home pancreatic enzymes with meals.  ?  ?HTN ?Patient was normotensive throughout admission. Home lisinopril was restarted with resolution of AKI and amlodipine was started with appropriate blood pressure response. ? ?MDD ?GAD ?Continued home venlafaxine XR, quetiapine, and amitriptyline. ?  ?GERD ?Managed with Protonix 40 mg daily. ?

## 2021-06-11 DIAGNOSIS — E1069 Type 1 diabetes mellitus with other specified complication: Secondary | ICD-10-CM

## 2021-06-11 DIAGNOSIS — E1040 Type 1 diabetes mellitus with diabetic neuropathy, unspecified: Secondary | ICD-10-CM

## 2021-06-11 DIAGNOSIS — M86162 Other acute osteomyelitis, left tibia and fibula: Secondary | ICD-10-CM | POA: Diagnosis not present

## 2021-06-11 DIAGNOSIS — T8744 Infection of amputation stump, left lower extremity: Secondary | ICD-10-CM | POA: Diagnosis not present

## 2021-06-11 DIAGNOSIS — L03116 Cellulitis of left lower limb: Secondary | ICD-10-CM | POA: Diagnosis not present

## 2021-06-11 DIAGNOSIS — M869 Osteomyelitis, unspecified: Secondary | ICD-10-CM

## 2021-06-11 LAB — CBC WITH DIFFERENTIAL/PLATELET
Abs Immature Granulocytes: 0.03 10*3/uL (ref 0.00–0.07)
Basophils Absolute: 0.1 10*3/uL (ref 0.0–0.1)
Basophils Relative: 1 %
Eosinophils Absolute: 0.1 10*3/uL (ref 0.0–0.5)
Eosinophils Relative: 2 %
HCT: 32.8 % — ABNORMAL LOW (ref 39.0–52.0)
Hemoglobin: 10.6 g/dL — ABNORMAL LOW (ref 13.0–17.0)
Immature Granulocytes: 0 %
Lymphocytes Relative: 20 %
Lymphs Abs: 1.4 10*3/uL (ref 0.7–4.0)
MCH: 31.3 pg (ref 26.0–34.0)
MCHC: 32.3 g/dL (ref 30.0–36.0)
MCV: 96.8 fL (ref 80.0–100.0)
Monocytes Absolute: 0.8 10*3/uL (ref 0.1–1.0)
Monocytes Relative: 12 %
Neutro Abs: 4.5 10*3/uL (ref 1.7–7.7)
Neutrophils Relative %: 65 %
Platelets: 211 10*3/uL (ref 150–400)
RBC: 3.39 MIL/uL — ABNORMAL LOW (ref 4.22–5.81)
RDW: 12.9 % (ref 11.5–15.5)
WBC: 7.1 10*3/uL (ref 4.0–10.5)
nRBC: 0 % (ref 0.0–0.2)

## 2021-06-11 LAB — BASIC METABOLIC PANEL
Anion gap: 8 (ref 5–15)
BUN: 22 mg/dL (ref 8–23)
CO2: 26 mmol/L (ref 22–32)
Calcium: 8.9 mg/dL (ref 8.9–10.3)
Chloride: 99 mmol/L (ref 98–111)
Creatinine, Ser: 1.15 mg/dL (ref 0.61–1.24)
GFR, Estimated: >60 mL/min (ref >60)
Glucose, Bld: 192 mg/dL — ABNORMAL HIGH (ref 70–99)
Potassium: 4.1 mmol/L (ref 3.5–5.1)
Sodium: 133 mmol/L — ABNORMAL LOW (ref 135–145)

## 2021-06-11 LAB — HEMOGLOBIN A1C
Hgb A1c MFr Bld: 8 % — ABNORMAL HIGH (ref 4.8–5.6)
Mean Plasma Glucose: 182.9 mg/dL

## 2021-06-11 LAB — GLUCOSE, CAPILLARY
Glucose-Capillary: 174 mg/dL — ABNORMAL HIGH (ref 70–99)
Glucose-Capillary: 236 mg/dL — ABNORMAL HIGH (ref 70–99)
Glucose-Capillary: 226 mg/dL — ABNORMAL HIGH (ref 70–99)
Glucose-Capillary: 131 mg/dL — ABNORMAL HIGH (ref 70–99)

## 2021-06-11 MED ORDER — DOXYCYCLINE HYCLATE 100 MG PO TABS: 100.0000 mg | ORAL_TABLET | Freq: Two times a day (BID) | ORAL | 0 refills | Status: DC

## 2021-06-11 MED ORDER — VANCOMYCIN HCL 750 MG/150ML IV SOLN: 750.0000 mg | Freq: Two times a day (BID) | INTRAVENOUS | Status: DC

## 2021-06-11 MED ORDER — VANCOMYCIN HCL 1250 MG/250ML IV SOLN
1250.0000 mg | Freq: Two times a day (BID) | INTRAVENOUS | Status: DC
  Administered 2021-06-11 – 2021-06-12 (×3): 1250 mg via INTRAVENOUS
  Filled 2021-06-11 (×5): qty 250

## 2021-06-11 NOTE — Evaluation (Signed)
Physical Therapy Evaluation ?Patient Details ?Name: Thomas Frye ?MRN: [id number] ?DOB: [birth year] ?Today's Date: 06/11/2021 ? ?History of Present Illness ? 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  ?Clinical Impression ? Patient admitted with above and reports previously walking with prosthetic and RW at home and working with HHPT on ambulation with cane.  States he is hopeful to have leg taken care of then possibly go to [facility] rehab prior to return home.  He was able to transfer using lateral scooting to recliner with minguard for safety (chair without drop armrests) and may end up needing short acute inpatient rehab stay prior to home pending further amputation of L LE.  PT will continue to follow acutely.    ?   ? ?Recommendations for follow up therapy are one component of a multi-disciplinary discharge planning process, led by the attending physician.  Recommendations may be updated based on patient status, additional functional criteria and insurance authorization. ? ?Follow Up Recommendations Acute inpatient rehab (3hours/day) ? ?  ?Assistance Recommended at Discharge Frequent or constant Supervision/Assistance  ?Patient can return home with the following ? A little help with walking and/or transfers;Assist for transportation;Assistance with cooking/housework;A lot of help with bathing/dressing/bathroom ? ?  ?Equipment Recommendations None recommended by PT  ?Recommendations for Other Services ?    ?  ?Functional Status Assessment Patient has had a recent decline in their functional status and demonstrates the ability to make  significant improvements in function in a reasonable and predictable amount of time.  ? ?  ?Precautions / Restrictions Precautions ?Precautions: Fall ?Precaution Comments: R BKA  ? ?  ? ?Mobility ? Bed Mobility ?  ?  ?  ?  ?  ?  ?  ?General bed mobility comments: seated EOB throughout ?  ? ?Transfers ?Overall transfer level: Needs assistance ?  ?Transfers: Bed to chair/wheelchair/BSC ?  ?  ?  ?  ?  ? Lateral/Scoot Transfers: Min guard ?General transfer comment: scoot/pivot into recliner (non-drop arm) assist to keep chair from moving and minguard for safety, pt positioned hips and moved under his own power ?  ? ?Ambulation/Gait ?  ?  ?  ?  ?  ?  ?  ?  ? ?Stairs ?  ?  ?  ?  ?  ? ?Wheelchair Mobility ?  ? ?Modified Rankin (Stroke Patients Only) ?  ? ?  ? ?Balance Overall balance assessment: Needs assistance ?Sitting-balance support: Feet supported ?Sitting balance-Leahy Scale: Good ?Sitting balance - Comments: sitting EOB upon PT entry, moves within and some outside BOS without LOB. ?  ?  ?  ?Standing balance comment: NT ?  ?  ?  ?  ?  ?  ?  ?  ?  ?  ?  ?   ? ? ? ?Pertinent Vitals/Pain Pain Assessment ?Pain Assessment: No/denies pain  ? ? ?Home Living Family/patient expects to be discharged to:: Private residence ?Living Arrangements: Spouse/significant other;Children;Other (Comment) (grandchildren 7 &3) ?  ?Type of Home: House ?Home Access: Level entry ?  ?  ?  ?Home Layout: One level ?Home Equipment: Conservation officer, nature (2 wheels);Wheelchair - manual;Cane - single  point;Tub bench;Grab bars - tub/shower ?   ?  ?Prior Function Prior Level of Function : Independent/Modified Independent ?  ?  ?  ?  ?  ?  ?Mobility Comments: reports was getting HHPT for prosthetic training with cane ?ADLs Comments: wheelchair does not fit in bathroom nor in bedroom ?  ? ? ?Hand Dominance  ? Dominant Hand: Left ? ?  ?Extremity/Trunk Assessment  ? Upper Extremity Assessment ?Upper Extremity Assessment: Overall WFL for tasks assessed ?  ? ?Lower  Extremity Assessment ?Lower Extremity Assessment: RLE deficits/detail;LLE deficits/detail ?RLE Deficits / Details: BKA with draining wound on lower anterior portion of residual limb; ROM/strength WFL ?LLE Deficits / Details: amputations of first two toes, noted healing ulcer above ankle on anterior aspect, has neuropathy but reports normal sensaion above ankle; AROM & strength grossly WFL ?LLE Sensation: history of peripheral neuropathy ?  ? ?Cervical / Trunk Assessment ?Cervical / Trunk Assessment: Kyphotic  ?Communication  ? Communication: No difficulties  ?Cognition Arousal/Alertness: Awake/alert ?Behavior During Therapy: [facility] for tasks assessed/performed ?Overall Cognitive Status: Within Functional Limits for tasks assessed ?  ?  ?  ?  ?  ?  ?  ?  ?  ?  ?  ?  ?  ?  ?  ?  ?  ?  ?  ? ?  ?General Comments General comments (skin integrity, edema, etc.): Reports wife and daughter want his leg to be healed before he can come home, states has been treated throught [facility] with limb salvage specialist and has been to [facility] rehab in the past, hopeful to return there at d/c. ? ?  ?Exercises    ? ?Assessment/Plan  ?  ?PT Assessment Patient needs continued PT services  ?PT Problem List Decreased mobility;Decreased knowledge of use of DME;Decreased balance ? ?   ?  ?PT Treatment Interventions Therapeutic activities;Therapeutic exercise;Patient/family education;Balance training;Wheelchair mobility training;Functional mobility training;DME instruction   ? ?PT Goals (Current goals can be found in the Care Plan section)  ?Acute Rehab PT Goals ?Patient Stated Goal: to go to rehab before going home ?PT Goal Formulation: With patient ?Time For Goal Achievement: 06/25/21 ?Potential to Achieve Goals: Good ? ?  ?Frequency Min 3X/week ?  ? ? ?Co-evaluation   ?  ?  ?  ?  ? ? ?  ?AM-PAC PT "6 Clicks" Mobility  ?Outcome Measure Help needed turning from your back to your side while in a flat bed without using bedrails?: None ?Help needed  moving from lying on your back to sitting on the side of a flat bed without using bedrails?: None ?Help needed moving to and from a bed to a chair (including a wheelchair)?: A Little ?Help needed standing up from a chair using your arms (e.g., wheelchair or bedside chair)?: A Little ?Help needed to walk in hospital room?: Total ?Help needed climbing 3-5 steps with a railing? : Total ?6 Click Score: 16 ? ?  ?End of Session Equipment Utilized During Treatment: Gait belt ?Activity Tolerance: Patient tolerated treatment well ?Patient left: in bed;with call bell/phone within reach (handoff to OT) ?  ?PT Visit Diagnosis: Other abnormalities of gait and mobility (R26.89);Difficulty in walking, not elsewhere classified (R26.2) ?  ? ?Time: 1050-1108 ?PT Time Calculation (min) (ACUTE ONLY): 18 min ? ? ?Charges:   PT Evaluation ?$PT Eval Moderate Complexity: 1 Mod ?  ?  ?   ? ? ?Magda Kiel, PT ?Acute Rehabilitation Services ?[id number] ?Office:[phone] ?06/11/2021 ? ? ?Reginia Naas ?06/11/2021, 11:51 AM ? ?

## 2021-06-11 NOTE — Evaluation (Signed)
Occupational Therapy Evaluation ?Patient Details ?Name: Thomas Frye ?MRN: [id number] ?DOB: 1955/10/16 ?Today's Date: 06/11/2021 ? ? ?History of Present Illness Pt is a 66 y/o male presenting on 3/16 with L BKA wound, recent fall out of bed.  MRI with cellulitis, osteomyelitis involving the tibia. 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  ? ?Clinical Impression ?  ?PTA patient reports completing mobility using RW and prosthetic at home with modified independence, completing ADLS and light IADLs.  Admitted for above and presents with problem list below, including impaired balance, decreased activity tolerance and generalized weakness.  He currently requires min assist for lateral scoot transfers, min assist for LB ADLs and mod assist for toielting.  He lives with family, but reports his wc will not fit in his bathroom (can only access with RW and prosthetic).  Pending orthopedic surgery recommendations, may need AIR prior to dc home.  Will follow acutely.   ?   ? ?Recommendations for follow up therapy are one component of a multi-disciplinary discharge planning process, led by the attending physician.  Recommendations may be updated based on patient status, additional functional criteria and insurance authorization.  ? ?Follow Up Recommendations ? Acute inpatient rehab (3hours/day)  ?  ?Assistance Recommended at Discharge Intermittent Supervision/Assistance  ?Patient can return home with the following A little help with walking and/or transfers;A little help with bathing/dressing/bathroom;Assistance with cooking/housework;Help with stairs or ramp for entrance;Assist for transportation ? ?  ?Functional Status Assessment ? Patient has had a recent decline in their functional status and demonstrates the ability to make significant improvements in function in a  reasonable and predictable amount of time.  ?Equipment Recommendations ? BSC/3in1 (drop arm)  ?  ?Recommendations for Other Services Rehab consult ? ? ?  ?Precautions / Restrictions Precautions ?Precautions: Fall ?Precaution Comments: R BKA  ? ?  ? ?Mobility Bed Mobility ?  ?  ?  ?  ?  ?  ?  ?General bed mobility comments: EOB upon entry ?  ? ?Transfers ?  ?  ?  ?  ?  ?  ?  ?  ?  ?  ?  ? ?  ?Balance Overall balance assessment: Needs assistance ?Sitting-balance support: No upper extremity supported, Feet supported ?Sitting balance-Leahy Scale: Good ?  ?  ?  ?  ?  ?  ?  ?  ?  ?  ?  ?  ?  ?  ?  ?  ?   ? ?ADL either performed or assessed with clinical judgement  ? ?ADL Overall ADL's : Needs assistance/impaired ?  ?  ?Grooming: Set up;Sitting ?  ?  ?  ?  ?  ?Upper Body Dressing : Set up;Sitting ?  ?Lower Body Dressing: Minimal assistance;Sitting/lateral leans ?  ?Toilet Transfer: Minimal assistance ?Toilet Transfer Details (indicate cue type and reason): lateral scoot simulated to recliner ?Toileting- Clothing Manipulation and Hygiene: Moderate assistance;Sitting/lateral lean ?  ?  ?  ?Functional mobility during ADLs: Minimal assistance ?General ADL Comments: lateral scoot into recliner, good technique but cueing and min assist to boost back into chair as when scooting  came too far foward  ? ? ? ?Vision Baseline Vision/History: 5 Retinopathy ?Vision Assessment?: No apparent visual deficits  ?   ?Perception   ?  ?Praxis   ?  ? ?Pertinent Vitals/Pain Pain Assessment ?Pain Assessment: No/denies pain  ? ? ? ?Hand Dominance Left ?  ?  Extremity/Trunk Assessment Upper Extremity Assessment ?Upper Extremity Assessment: Overall WFL for tasks assessed ?  ?Lower Extremity Assessment ?Lower Extremity Assessment: Defer to PT evaluation ?RLE Deficits / Details: BKA with draining wound on lower anterior portion of residual limb; ROM/strength WFL ?LLE Deficits / Details: amputations of first two toes, noted healing ulcer above ankle on  anterior aspect, has neuropathy but reports normal sensaion above ankle; AROM & strength grossly WFL ?LLE Sensation: history of peripheral neuropathy ?  ?Cervical / Trunk Assessment ?Cervical / Trunk Assessment: Kyphotic ?  ?Communication Communication ?Communication: No difficulties ?  ?Cognition Arousal/Alertness: Awake/alert ?Behavior During Therapy: [facility] for tasks assessed/performed ?Overall Cognitive Status: No family/caregiver present to determine baseline cognitive functioning ?Area of Impairment: Problem solving ?  ?  ?  ?  ?  ?  ?  ?  ?  ?  ?  ?  ?  ?  ?Problem Solving: Slow processing ?General Comments: anticipate baseline ?  ?  ?General Comments  Reports wife and daughter want his leg to be healed before he can come home, states has been treated throught [facility] with limb salvage specialist and has been to [facility] rehab in the past, hopeful to return there at d/c. ? ?  ?Exercises   ?  ?Shoulder Instructions    ? ? ?Home Living Family/patient expects to be discharged to:: Private residence ?Living Arrangements: Spouse/significant other;Children;Other (Comment) (grandchildren 7 &3) ?Available Help at Discharge: Family ?Type of Home: House ?Home Access: Level entry ?  ?  ?Home Layout: One level ?  ?  ?Bathroom Shower/Tub: Tub/shower unit ?  ?Bathroom Toilet: Standard ?Bathroom Accessibility: Yes ?How Accessible: Accessible via walker (not accessible via WC) ?Home Equipment: Agricultural consultantolling Walker (2 wheels);Wheelchair - manual;Cane - single point;Tub bench;Grab bars - tub/shower ?  ?  ?  ? ?  ?Prior Functioning/Environment Prior Level of Function : Independent/Modified Independent ?  ?  ?  ?  ?  ?  ?Mobility Comments: reports was getting HHPT for prosthetic training with cane ?ADLs Comments: was independent with ADLs, light iADLS PTA ?  ? ?  ?  ?OT Problem List: Decreased strength;Decreased activity tolerance;Impaired balance (sitting and/or standing);Decreased knowledge of use of DME or AE;Decreased knowledge of  precautions;Obesity ?  ?   ?OT Treatment/Interventions: Self-care/ADL training;DME and/or AE instruction;Therapeutic activities;Therapeutic exercise;Patient/family education;Balance training  ?  ?OT Goals(Current goals can be found in the care plan section) Acute Rehab OT Goals ?Patient Stated Goal: to get rehab before going home ?OT Goal Formulation: With patient ?Time For Goal Achievement: 06/25/21 ?Potential to Achieve Goals: Good  ?OT Frequency: Min 2X/week ?  ? ?Co-evaluation   ?  ?  ?  ?  ? ?  ?AM-PAC OT "6 Clicks" Daily Activity     ?Outcome Measure Help from another person eating meals?: A Little ?Help from another person taking care of personal grooming?: A Little ?Help from another person toileting, which includes using toliet, bedpan, or urinal?: A Lot ?Help from another person bathing (including washing, rinsing, drying)?: A Little ?Help from another person to put on and taking off regular upper body clothing?: A Little ?Help from another person to put on and taking off regular lower body clothing?: A Little ?6 Click Score: 17 ?  ?End of Session Nurse Communication: Mobility status;Precautions ? ?Activity Tolerance: Patient tolerated treatment well ?Patient left: in chair;with call bell/phone within reach ? ?OT Visit Diagnosis: Other abnormalities of gait and mobility (R26.89)  ?              ?  Time: 6010-9323 ?OT Time Calculation (min): 22 min ?Charges:  OT General Charges ?$OT Visit: 1 Visit ?OT Evaluation ?$OT Eval Moderate Complexity: 1 Mod ? ?Barry Brunner, OT ?Acute Rehabilitation Services ?Pager [phone] ?Office [phone] ? ? ?Chancy Milroy ?06/11/2021, 12:16 PM ?

## 2021-06-11 NOTE — Progress Notes (Signed)
Inpatient Rehab Admissions Coordinator:  ? ?Per therapy recommendations, patient was screened for CIR candidacy by Megan Salon, MS, CCC-SLP ?Marland Kitchen At this time, Pt. Remains observation status and does not appear to demonstrate medical necessity to justify in hospital rehabilitation/CIR. will not pursue a rehab consult for this Pt.   Note that Pt. Prefers [facility] if he does go go an AIR. Recommend other rehab venues to be pursued.  Please contact me with any questions. ? ?Megan Salon, MS, CCC-SLP ?Rehab Admissions Coordinator  ?[phone] (celll) ?[phone] (office) ? ? ? ?

## 2021-06-11 NOTE — Progress Notes (Deleted)
? ?Name: Thomas Frye ?MRN: [id number] ?DOB: 1955-06-18 66 y.o. ?PCP: [name], MD ? ?Date of Admission: 06/10/2021  5:04 AM ?Date of Discharge:  06/11/2021 ?Attending Physician: Dr. Angelia Mould ? ?DISCHARGE DIAGNOSIS:  ?Primary Problem: Cellulitis of left lower extremity without foot  ? ?Hospital Problems: ?Principal Problem: ?  Cellulitis of left lower extremity without foot ?Active Problems: ?  AKI (acute kidney injury) ([place]) ?  OSA (obstructive sleep apnea) ?  DM (diabetes mellitus), type 1 with peripheral vascular complications ([place]) ?  Pancreatic insufficiency ?  Infection of left below knee amputation ([place]) ?  ? ?DISCHARGE MEDICATIONS:  ? ?Allergies as of 06/11/2021   ? ?   Reactions  ? Latex Swelling  ? Sulfa Antibiotics Hives  ? ?  ? ?  ?Medication List  ?  ? ?TAKE these medications   ? ?acetaminophen 325 MG tablet ?Commonly known as: TYLENOL ?Take 650 mg by mouth every 6 (six) hours as needed for moderate pain. ?  ?amitriptyline 25 MG tablet ?Commonly known as: ELAVIL ?Take 25 mg by mouth at bedtime. ?  ?atorvastatin 10 MG tablet ?Commonly known as: LIPITOR ?Take 10 mg by mouth at bedtime. ?  ?clopidogrel 75 MG tablet ?Commonly known as: PLAVIX ?Take 75 mg by mouth daily. ?  ?doxazosin 1 MG tablet ?Commonly known as: CARDURA ?Take 0.5 tablets (0.5 mg total) by mouth daily. ?  ?doxycycline 100 MG tablet ?Commonly known as: VIBRA-TABS ?Take 1 tablet (100 mg total) by mouth 2 (two) times daily for 14 days. ?  ?ferrous sulfate 325 (65 FE) MG tablet ?Take 325 mg by mouth daily with breakfast. ?  ?FreeStyle Libre 14 Day Reader Kerrin Mo ?by Does not apply route. ?  ?insulin aspart 100 UNIT/ML injection ?Commonly known as: novoLOG ?CHECK YOUR CBG 3 TIMES A DAY WITH MEALS AND USE A SLIDING SCALE AS FOLLOWS: ?For CBG 121-150 inject 1unit of insulin ?For CBG 151-200 inject 2units of insulin ?For CBG 201-250 inject 3units of insulin  ?For CBG 251-300 inject 5units of insulin ?For CBG 301-350 inject 7units of  insulin ?For CBG 351-400 inject 9units of insulin ?For CBG >400 call your MD ?  ?insulin regular 100 units/mL injection ?Commonly known as: NOVOLIN R ?follow insulin pump, Soln, Subcutaneous, As Indicated, 10 mL, 0 Refill(s), Route to Pharmacy Electronically, Krist[facility] Oppen[facility], [place], 06/20/20 6:38:00 EDT, Height/Length Dosing, cm, 140.6, 06/20/20 6:38:00 EDT, Weight Dosing, kg ?  ?lisinopril 20 MG tablet ?Commonly known as: ZESTRIL ?Take 20 mg by mouth daily. ?  ?loperamide 2 MG capsule ?Commonly known as: IMODIUM ?Take by mouth as needed for diarrhea or loose stools. ?  ?metoCLOPramide 5 MG tablet ?Commonly known as: Reglan ?Take 1 tablet (5 mg total) by mouth 4 (four) times daily. ?  ?omeprazole 40 MG capsule ?Commonly known as: PRILOSEC ?Take 40 mg by mouth 2 (two) times daily. ?  ?Omnipod DASH Pods (Gen 4) Misc ?USE 1 POD EVERY 2 DAYS AS NEEDED ?  ?Omnipod DASH Pods (Gen 4) Misc ?Inject into the skin. ?  ?Pancrelipase (Lip-Prot-Amyl) 25000-79000 units Cpep ?Take 1-2 capsules by mouth See admin instructions. 2 capsules  with meals  ?1 capsule with snacks ?  ?pramipexole 0.125 MG tablet ?Commonly known as: MIRAPEX ?Take 0.125 mg by mouth at bedtime. ?  ?PROBIOTIC PO ?Take 1 tablet by mouth daily. ?  ?QUEtiapine 50 MG tablet ?Commonly known as: SEROQUEL ?Take 50 mg by mouth at bedtime. ?  ?Tyler Aas FlexTouch 100 UNIT/ML FlexTouch Pen ?Generic drug:  insulin degludec ?Inject 60 Units into the skin daily. ?  ?venlafaxine XR 150 MG 24 hr capsule ?Commonly known as: EFFEXOR-XR ?Take 150 mg by mouth daily with breakfast. ?  ?vitamin E 1000 UNIT capsule ?Take 1,000 Units by mouth in the morning and at bedtime. ?  ? ?  ? ?  ?  ? ? ?  ?Durable Medical Equipment  ?(From admission, onward)  ?  ? ? ?  ? ?  Start     Ordered  ? 06/11/21 1438  For home use only DME 3 n 1  Once       ? 06/11/21 1437  ? ?  ?  ? ?  ? ? ?  ?Discharge Care Instructions  ?(From admission, onward)  ?  ? ? ?  ? ?  Start     Ordered  ?  06/11/21 0000  Change dressing (specify)       ?Comments: Dressing change: Change dressing daily  ? 06/11/21 1629  ? ?  ?  ? ?  ? ? ?DISPOSITION AND FOLLOW-UP:  ?Mr.FREDIS MALKIEWICZ was discharged from [facility] in Stable condition. At the hospital follow up visit please address: ? ?#Moderate nonpurulent cellulitis of left stump with wound sinus ?#L tibia osteomyelitis  ?Ensure patient is taking doxycycline as prescribed. Recheck CBC and monitor for signs of systemic infection. Follow-up blood culture, wound cultures. ?  ?#AKI ?Recheck BMP and monitor for continued improvement of AKI. ? ?Follow-up Recommendations: ?Consults: Orthopedic surgery ?Labs: Basic Metabolic Profile and CBC ?Studies: None ?Medications:  ? ?START: ?Doxycycline 100 mg twice daily for two weeks ?No other medication changes were made. ? ?Follow-up Appointments: ? Follow-up Information   ? ? [name], MD Follow up on 06/16/2021.   ?Specialty: Vascular Surgery ?Why: At 2:30 PM ?Contact information: ?[address] ?[address] ?[place] [address] ?[phone] ? ? ?  ?  ? ? [name], MD. Schedule an appointment as soon as possible for a visit in 1 week(s).   ?Specialty: Geriatric Medicine ?Contact information: ?[address] ?[place] [address] ?[phone] ? ? ?  ?  ? ?  ?  ? ?  ? ? ?HOSPITAL COURSE:  ?Patient Summary: ?#Moderate nonpurulent cellulitis of left stump with wound sinus ?#L tibia osteomyelitis  ?Patient presented with draining wound over L BKA stump. ESR and CRP were elevated but patient was hemodynamically stable without fever or leukocytosis. MRI showed open wound involving the distal aspect of the left lower extremity anteriorly near the amputation site, associated cellulitis and myofasciitis, no discrete rim enhancing abscess is identified; osteomyelitis involving the tibia near the amputation site; no findings for septic arthritis at the knee joint. Wound  culture showing few staph aureus and blood culture shows NGTD. He was started on ceftriaxone and metronidazole on admission and vancomycin was added 03/17. He was hemodynamically stable without fever at time of discharge. ? ?#Type 1 diabetes mellitus with retinopathy, neuropathy, and PVD  ?Patient was hyperglycemic on presentation. He was started on Levemir 54 units nightly and 8 units with meals as well as SSI and nightly insulin correction; he did have a few hypoglycemic episodes which corrected with food and juice. ?  ?#AKI ?Admission labs showed creatinine of 1.70, increased from baseline of around 1.0-1.10. Overnight this improved to 1.15. Home lisinopril was held. ?  ?#OSA ?CPAP at night. ?  ?#Pancreatic insufficiency ?Continued home pancreatic enzymes with meals.  ?  ?#HTN ?Patient was normotensive  throughout admission. Home lisinopril was held.  ?  ?#MDD ?#GAD ?Continued home venlafaxine XR, quetiapine, and amitriptyline. ?  ?#GERD ?Managed with Protonix 40 mg daily.  ? ?DISCHARGE INSTRUCTIONS:  ?Mr. Alroy Dust,  ? ?It was a pleasure taking care of you at [facility] were admitted for an infection of your amputation site and treated for osteomyelitis. We are discharging you home now that you are doing better. We have spoken with Dr. Terrial Rhodes and he is aware of your hospitalization and discharge today.  Please follow the following instructions.  ?1) Follow up with Dr. Terrial Rhodes on Wednesday, March 22nd at 2:30 pm ?2) Start taking Doxycycline 100 mg twice daily for two weeks ?3) Call Dr. Harriet Masson office if you start having fevers, chills or worsening pain. If you are unable to reach him, go to your nearest ER.  ? ?Take care,  ? ?Dr. Linwood Dibbles, MD, MPH  ? ?SUBJECTIVE:  ?Patient evaluated at bedside on day of discharge. He seems well and has no complaints. He is understandably worried about needing further amputation. ? ?Discharge Vitals:   ?BP 132/79 (BP Location: Left Arm)   Pulse 90    Temp 97.7 ?F (36.5 ?C) (Oral)   Resp 18   Ht $R'6\' 3"'BK$  (1.905 m)   Wt 134.8 kg   SpO2 94%   BMI 37.14 kg/m?  ? ?OBJECTIVE:  ?Constitutional:Pleasant gentleman resting in bed in no acute distress. ?Cardio:Regular rate a

## 2021-06-11 NOTE — Discharge Summary (Deleted)
? ?Name: Thomas Frye ?MRN: [id number] ?DOB: 20-May-1955 66 y.o. ?PCP: [name], MD ? ?Date of Admission: 06/10/2021  5:04 AM ?Date of Discharge:  06/11/2021 ?Attending Physician: Dr. Angelia Mould ? ?DISCHARGE DIAGNOSIS:  ?Primary Problem: Cellulitis of left lower extremity without foot  ? ?Hospital Problems: ?Principal Problem: ?  Cellulitis and osteomyelitis of left lower extremity at BKA site ?Active Problems: ?  AKI (acute kidney injury) ([place]) ?  OSA (obstructive sleep apnea) ?  DM (diabetes mellitus) with peripheral vascular complications ([place]) ?  Pancreatic insufficiency ?  Infection of left below knee amputation ([place]) ?  ? ?DISCHARGE MEDICATIONS:  ? ?Allergies as of 06/11/2021   ? ?   Reactions  ? Latex Swelling  ? Sulfa Antibiotics Hives  ? ?  ? ?  ?Medication List  ?  ? ?TAKE these medications   ? ?acetaminophen 325 MG tablet ?Commonly known as: TYLENOL ?Take 650 mg by mouth every 6 (six) hours as needed for moderate pain. ?  ?amitriptyline 25 MG tablet ?Commonly known as: ELAVIL ?Take 25 mg by mouth at bedtime. ?  ?atorvastatin 10 MG tablet ?Commonly known as: LIPITOR ?Take 10 mg by mouth at bedtime. ?  ?clopidogrel 75 MG tablet ?Commonly known as: PLAVIX ?Take 75 mg by mouth daily. ?  ?doxazosin 1 MG tablet ?Commonly known as: CARDURA ?Take 0.5 tablets (0.5 mg total) by mouth daily. ?  ?doxycycline 100 MG tablet ?Commonly known as: VIBRA-TABS ?Take 1 tablet (100 mg total) by mouth 2 (two) times daily for 14 days. ?  ?ferrous sulfate 325 (65 FE) MG tablet ?Take 325 mg by mouth daily with breakfast. ?  ?FreeStyle Libre 14 Day Reader Kerrin Mo ?by Does not apply route. ?  ?insulin aspart 100 UNIT/ML injection ?Commonly known as: novoLOG ?CHECK YOUR CBG 3 TIMES A DAY WITH MEALS AND USE A SLIDING SCALE AS FOLLOWS: ?For CBG 121-150 inject 1unit of insulin ?For CBG 151-200 inject 2units of insulin ?For CBG 201-250 inject 3units of insulin  ?For CBG 251-300 inject 5units of insulin ?For CBG 301-350 inject  7units of insulin ?For CBG 351-400 inject 9units of insulin ?For CBG >400 call your MD ?  ?insulin regular 100 units/mL injection ?Commonly known as: NOVOLIN R ?follow insulin pump, Soln, Subcutaneous, As Indicated, 10 mL, 0 Refill(s), Route to Pharmacy Electronically, [facility], [place], 06/20/20 6:38:00 EDT, Height/Length Dosing, cm, 140.6, 06/20/20 6:38:00 EDT, Weight Dosing, kg ?  ?lisinopril 20 MG tablet ?Commonly known as: ZESTRIL ?Take 20 mg by mouth daily. ?  ?loperamide 2 MG capsule ?Commonly known as: IMODIUM ?Take by mouth as needed for diarrhea or loose stools. ?  ?metoCLOPramide 5 MG tablet ?Commonly known as: Reglan ?Take 1 tablet (5 mg total) by mouth 4 (four) times daily. ?  ?omeprazole 40 MG capsule ?Commonly known as: PRILOSEC ?Take 40 mg by mouth 2 (two) times daily. ?  ?Omnipod DASH Pods (Gen 4) Misc ?USE 1 POD EVERY 2 DAYS AS NEEDED ?  ?Omnipod DASH Pods (Gen 4) Misc ?Inject into the skin. ?  ?Pancrelipase (Lip-Prot-Amyl) 25000-79000 units Cpep ?Take 1-2 capsules by mouth See admin instructions. 2 capsules  with meals  ?1 capsule with snacks ?  ?pramipexole 0.125 MG tablet ?Commonly known as: MIRAPEX ?Take 0.125 mg by mouth at bedtime. ?  ?PROBIOTIC PO ?Take 1 tablet by mouth daily. ?  ?QUEtiapine 50 MG tablet ?Commonly known as: SEROQUEL ?Take 50 mg by mouth at bedtime. ?  ?Tyler Aas FlexTouch 100 UNIT/ML FlexTouch Pen ?Generic  drug: insulin degludec ?Inject 60 Units into the skin daily. ?  ?venlafaxine XR 150 MG 24 hr capsule ?Commonly known as: EFFEXOR-XR ?Take 150 mg by mouth daily with breakfast. ?  ?vitamin E 1000 UNIT capsule ?Take 1,000 Units by mouth in the morning and at bedtime. ?  ? ?  ? ?  ?  ? ? ?  ?Durable Medical Equipment  ?(From admission, onward)  ?  ? ? ?  ? ?  Start     Ordered  ? 06/11/21 1438  For home use only DME 3 n 1  Once       ? 06/11/21 1437  ? ?  ?  ? ?  ? ? ?  ?Discharge Care Instructions  ?(From admission, onward)  ?  ? ? ?  ? ?  Start     Ordered   ? 06/11/21 0000  Change dressing (specify)       ?Comments: Dressing change: Change dressing daily  ? 06/11/21 1629  ? ?  ?  ? ?  ? ? ?DISPOSITION AND FOLLOW-UP:  ?Mr.COCHISE DINNEEN was discharged from [facility] in Stable condition. At the hospital follow up visit please address: ? ?#Moderate nonpurulent cellulitis of left stump with wound sinus ?#L tibia osteomyelitis  ?Ensure patient is taking doxycycline as prescribed. Recheck CBC and monitor for signs of systemic infection. Follow-up blood culture, wound cultures. ?  ?#AKI ?Recheck BMP and monitor for continued improvement of AKI. ? ?Follow-up Recommendations: ?Consults: Orthopedic surgery ?Labs: Basic Metabolic Profile and CBC ?Studies: None ?Medications:  ? ?START: ?Doxycycline 100 mg twice daily for two weeks ?No other medication changes were made. ? ?Follow-up Appointments: ? Follow-up Information   ? ? [name], MD Follow up on 06/16/2021.   ?Specialty: Vascular Surgery ?Why: At 2:30 PM ?Contact information: ?[address] ?[address] ?[place] [address] ?[phone] ? ? ?  ?  ? ? [name], MD. Schedule an appointment as soon as possible for a visit in 1 week(s).   ?Specialty: Geriatric Medicine ?Contact information: ?[address] ?[place] [address] ?[phone] ? ? ?  ?  ? ?  ?  ? ?  ? ? ?HOSPITAL COURSE:  ?Patient Summary: ?#Moderate nonpurulent cellulitis of left stump with wound sinus ?#L tibia osteomyelitis  ?Patient presented with draining wound over L BKA stump. ESR and CRP were elevated but patient was hemodynamically stable without fever or leukocytosis. MRI showed open wound involving the distal aspect of the left lower extremity anteriorly near the amputation site, associated cellulitis and myofasciitis, no discrete rim enhancing abscess is identified; osteomyelitis involving the tibia near the amputation site; no findings for septic arthritis at the knee joint.  Wound culture showing few staph aureus and blood culture shows NGTD. He was started on ceftriaxone and metronidazole on admission and vancomycin was added 03/17. He was hemodynamically stable without fever at time of discharge. ? ?#Type 1 diabetes mellitus with retinopathy, neuropathy, and PVD  ?Patient was hyperglycemic on presentation. He was started on Levemir 54 units nightly and 8 units with meals as well as SSI and nightly insulin correction; he did have a few hypoglycemic episodes which corrected with food and juice. ?  ?#AKI ?Admission labs showed creatinine of 1.70, increased from baseline of around 1.0-1.10. Overnight this improved to 1.15. Home lisinopril was held. ?  ?#OSA ?CPAP at night. ?  ?#Pancreatic insufficiency ?Continued home pancreatic enzymes with meals.  ?  ?#HTN ?Patient was  normotensive throughout admission. Home lisinopril was held.  ?  ?#MDD ?#GAD ?Continued home venlafaxine XR, quetiapine, and amitriptyline. ?  ?#GERD ?Managed with Protonix 40 mg daily.  ? ?DISCHARGE INSTRUCTIONS:  ? ?Discharge Instructions   ? ? Change dressing (specify)   Complete by: As directed ?  ? Dressing change: Change dressing daily  ? Diet - low sodium heart healthy   Complete by: As directed ?  ? Increase activity slowly   Complete by: As directed ?  ? ?  ?Mr. Alroy Dust,  ? ?It was a pleasure taking care of you at [facility] were admitted for an infection of your amputation site and treated for osteomyelitis. We are discharging you home now that you are doing better. We have spoken with Dr. Terrial Rhodes and he is aware of your hospitalization and discharge today.  Please follow the following instructions.  ?1) Follow up with Dr. Terrial Rhodes on Wednesday, March 22nd at 2:30 pm ?2) Start taking Doxycycline 100 mg twice daily for two weeks ?3) Call Dr. Harriet Masson office if you start having fevers, chills or worsening pain. If you are unable to reach him, go to your nearest ER.  ? ?Take care,  ? ?Dr. Linwood Dibbles, MD, MPH  ? ?SUBJECTIVE:  ?Patient evaluated at bedside on day of discharge. He seems well and has no complaints. He is understandably worried about needing further amputation. ? ?Discharge Vitals:

## 2021-06-11 NOTE — Progress Notes (Signed)
Pharmacy Antibiotic Note ? ?Thomas Frye is a 66 y.o. male admitted on 06/10/2021 with osteomyelitis.  Pharmacy has been consulted for Vanc dosing. ? ?Vancomycin 1250 mg IV Q 12 hrs. Goal AUC 400-550. ?Expected AUC: 532 ?SCr used: 1.15 ? ?Plan: ?Start Vancomycin 1250 mg IV q12hr ?Monitor renal function, clinical status, C&S and vanc levels at [facility]. ? ?Height: 6\' 3"  (190.5 cm) ?Weight: 134.8 kg (297 lb 2.9 oz) ?IBW/kg (Calculated) : 84.5 ? ?Temp (24hrs), Avg:97.9 ?F (36.6 ?C), Min:97.5 ?F (36.4 ?C), Max:98.2 ?F (36.8 ?C) ? ?Recent Labs  ?Lab 06/10/21 ?[id number] 06/10/21 ?[id number] 06/10/21 ?[id number] 06/11/21 ?0135  ?WBC 13.9*  --   --  7.1  ?CREATININE 1.70*  --   --  1.15  ?LATICACIDVEN  --  1.4 1.0  --   ?  ?Estimated Creatinine Clearance: 97.3 mL/min (by C-G formula based on SCr of 1.15 mg/dL).   ? ?Allergies  ?Allergen Reactions  ? Latex Swelling  ? Sulfa Antibiotics Hives  ? ? ?Antimicrobials this admission: ?Rocephin 3/16 >>  ?Flagyl  3/16 >>  ?Vanc 3/17 >> ? ?Microbiology results: ?3/16 BCx: ngtd ?3/16 Wound Cx: staph aureus, susc pending  ? ?Thank you for allowing pharmacy to be a part of this patient?s care. ? ?Alanda Slim, PharmD, FCCM ?Clinical Pharmacist ?Please see AMION for all Pharmacists' Contact Phone Numbers ?06/11/2021, 8:37 AM  ? ? ?

## 2021-06-11 NOTE — Progress Notes (Signed)
? ?Subjective:  ? ?Hospital day:2 ? ?Overnight event: No acute events overnight. ? ?In the morning, patient evaluated at bedside with spouse on the phone. He seems well and has no complaints. He is understandably worried about needing further amputation. ? ?Objective: ? ?Vital signs in last 24 hours: ?Vitals:  ? 06/10/21 2053 06/11/21 0440 06/11/21 0823 06/11/21 1701  ?BP: 125/65 104/60 132/79 132/75  ?Pulse: 85 83 90 100  ?Resp: 17 17 18 17   ?Temp: 98.2 ?F (36.8 ?C) (!) 97.5 ?F (36.4 ?C) 97.7 ?F (36.5 ?C) 98.5 ?F (36.9 ?C)  ?TempSrc: Oral Oral Oral Oral  ?SpO2: 96% 92% 94% 94%  ?Weight:  134.8 kg    ?Height:      ? ? ?Filed Weights  ? 06/10/21 0510 06/11/21 0440  ?Weight: 136.1 kg 134.8 kg  ? ? ? ?Intake/Output Summary (Last 24 hours) at 06/11/2021 2009 ?Last data filed at 06/11/2021 1300 ?Gross per 24 hour  ?Intake 1080 ml  ?Output 300 ml  ?Net 780 ml  ? ?Net IO Since Admission: 1,523.61 mL [06/11/21 2009] ? ?Recent Labs  ?  06/11/21 ?0819 06/11/21 ?1251 06/11/21 ?1658  ?GLUCAP 174* 236* 226*  ?  ? ?Pertinent Labs: ?CBC Latest Ref Rng & Units 06/11/2021 06/10/2021 01/20/2021  ?WBC 4.0 - 10.5 K/uL 7.1 13.9(H) -  ?Hemoglobin 13.0 - 17.0 g/dL 10.6(L) 12.8(L) 12.6(L)  ?Hematocrit 39.0 - 52.0 % 32.8(L) 37.8(L) 37.0(L)  ?Platelets 150 - 400 K/uL 211 247 -  ? ? ?CMP Latest Ref Rng & Units 06/11/2021 06/10/2021 01/20/2021  ?Glucose 70 - 99 mg/dL 01/22/2021) 149(F) -  ?BUN 8 - 23 mg/dL 22 026(V) -  ?Creatinine 0.61 - 1.24 mg/dL 78(H 8.85) -  ?Sodium 135 - 145 mmol/L 133(L) 132(L) 134(L)  ?Potassium 3.5 - 5.1 mmol/L 4.1 4.6 4.8  ?Chloride 98 - 111 mmol/L 99 95(L) -  ?CO2 22 - 32 mmol/L 26 24 -  ?Calcium 8.9 - 10.3 mg/dL 8.9 9.3 -  ?Total Protein 6.5 - 8.1 g/dL - 7.5 -  ?Total Bilirubin 0.3 - 1.2 mg/dL - 0.8 -  ?Alkaline Phos 38 - 126 U/L - 73 -  ?AST 15 - 41 U/L - 17 -  ?ALT 0 - 44 U/L - 13 -  ? ? ?Imaging: ?No results found. ? ?Physical Exam ? ?Constitutional:Pleasant gentleman resting in bed in no acute distress. ?Cardio:Regular  rate and rhythm. No murmurs, rubs, gallops. ?Pulm:Clear to auscultation bilaterally. Normal work of breathing on room air. ?Abdomen:Soft, nontender, nondistended. ?MSK:L BKA with erythema and warmth at the stump and 1 cm wound draining nonpurulent clear fluid. No significant pain on palpitation.  ?Skin:Warm and dry. ?Neuro:Alert and oriented x3. No focal deficit noted. ?Psych: Normal mood and affect. ? ?Assessment/Plan: ?Thomas Frye is a 66 y.o. male with hx of diabetes mellitus (mixed endocrine notes, Type 1 versus 2-I suspect type 2), hypertension, OSA, previous history of left BKA and 2022 that presented for evaluation of a wound and drainage out of his left BKA surgical site. ? ?Principal Problem: ?  Cellulitis of left lower extremity without foot ?Active Problems: ?  AKI (acute kidney injury) ([facility]) ?  OSA (obstructive sleep apnea) ?  DM (diabetes mellitus), type 1 with peripheral vascular complications ([facility]) ?  Pancreatic insufficiency ?  Infection of left below knee amputation ([facility]) ?  Osteomyelitis of left tibia [facility]) ? ?#Moderate nonpurulent cellulitis of left stump with wound sinus ?#L tibia osteomyelitis  ?#Type 1 diabetes mellitus with retinopathy, neuropathy, and PVD  ?#AKI,  resolved ?#Pancreatic insufficiency ?#OSA ?#MDD ?#GAD ?#HTN ?#GERD ?Patient initially discharged due to medical stability.  He has been hemodynamically stable to his short hospitalization.  Tentative plan was discharged home on p.o. antibiotics with plan to follow-up with his surgeon for reevaluation for possible surgery.  Patient's surgeon, Dr. Wardell Heath was informed of plan for discharge and was agreeable with plans to follow-up with patient next week in the clinic CIR. PT/OT recommended CIR for patient under the impression that patient was going to receive surgery. Patient not deemed a CIR candidate due to being in observation status. Patient was discharged with plan to send home with 3 in 1 [facility]. MD was informed by RN after  discharge that patient and spouse was surprised by discharge home and wanted to speak with MD. At bedside, spouse informed me that she does not feel safe taking patient home. Spouse has LVAD and is not in any shape to be able to get patient out of the car to her house. She does not have any help at home and patient's wheelchair is unable to navigate through certain parts of the house.  Patient unable to ambulate with his prosthetics due to stump infection. Spouse prefer for patient to go to rehab or another facility until his infection is cleared in order for him to ambulate again. Spouse was informed that we will need a recommendation by PT in order to discharge patient to SNF.  I informed spouse that we will keep patient overnight in order to figure out a better discharge plan with assistance from PT/OT and social work. ?--Discontinue discharge order ?--Reconsult PT/OT for reassessment tomorrow ?--Consult transition to care for assistance in disposition tomorrow ?--Scheduled outpatient appointment with Dr. Wardell Heath on 3/22 ? ?Diet: CM ?IVF: Lovenox ?VTE: None ?CODE: Full ? ?Prior to Admission Living Arrangement: Home ?Anticipated Discharge Location: Pending ?Barriers to Discharge: Pending PT reevaluation ?Dispo: Anticipated discharge in approximately 1-2 day(s).  ? ?Signed: ?Steffanie Rainwater, MD ?06/11/2021, 8:09 PM  ?Pager: [phone] ?Internal Medicine Teaching Service ?After 5pm on weekdays and 1pm on weekends: On Call pager: [phone] ? ?

## 2021-06-11 NOTE — Discharge Instructions (Addendum)
Thomas Frye,  ? ?It was a pleasure taking care of you at [facility]. You were admitted for an infection of your amputation site and treated for osteomyelitis. We are discharging you home now that you are doing better. We have spoken with Dr. Wardell Heath and he is aware of your hospitalization and discharge today.  Please follow the following instructions.  ?1) Follow up with Dr. Wardell Heath on Wednesday, March 22nd at 2:30 pm ?2) Start taking Doxycycline 100 mg twice daily for two weeks ?3) Call Dr. Geoffry Paradise office if you start having fevers, chills or worsening pain. If you are unable to reach him, go to your nearest ER.  ?4) Do not wear your prosthetics until you follow up with Dr. Wardell Heath next week ? ?Take care,  ? ?Dr. Sharrell Ku, MD, MPH  ?

## 2021-06-12 DIAGNOSIS — L03116 Cellulitis of left lower limb: Secondary | ICD-10-CM | POA: Diagnosis not present

## 2021-06-12 DIAGNOSIS — E1051 Type 1 diabetes mellitus with diabetic peripheral angiopathy without gangrene: Secondary | ICD-10-CM | POA: Diagnosis not present

## 2021-06-12 LAB — CBC
HCT: 33.3 % — ABNORMAL LOW (ref 39.0–52.0)
Hemoglobin: 11 g/dL — ABNORMAL LOW (ref 13.0–17.0)
MCH: 31.5 pg (ref 26.0–34.0)
MCHC: 33 g/dL (ref 30.0–36.0)
MCV: 95.4 fL (ref 80.0–100.0)
Platelets: 240 10*3/uL (ref 150–400)
RBC: 3.49 MIL/uL — ABNORMAL LOW (ref 4.22–5.81)
RDW: 12.8 % (ref 11.5–15.5)
WBC: 7.7 10*3/uL (ref 4.0–10.5)
nRBC: 0 % (ref 0.0–0.2)

## 2021-06-12 LAB — GLUCOSE, CAPILLARY
Glucose-Capillary: 124 mg/dL — ABNORMAL HIGH (ref 70–99)
Glucose-Capillary: 130 mg/dL — ABNORMAL HIGH (ref 70–99)
Glucose-Capillary: 199 mg/dL — ABNORMAL HIGH (ref 70–99)
Glucose-Capillary: 204 mg/dL — ABNORMAL HIGH (ref 70–99)

## 2021-06-12 LAB — AEROBIC CULTURE W GRAM STAIN (SUPERFICIAL SPECIMEN)

## 2021-06-12 MED ORDER — DOXYCYCLINE HYCLATE 100 MG PO TABS
100.0000 mg | ORAL_TABLET | Freq: Two times a day (BID) | ORAL | Status: DC
  Administered 2021-06-12 – 2021-06-14 (×5): 100 mg via ORAL
  Filled 2021-06-12 (×5): qty 1

## 2021-06-12 MED ORDER — LISINOPRIL 20 MG PO TABS
20.0000 mg | ORAL_TABLET | Freq: Every day | ORAL | Status: DC
  Administered 2021-06-12 – 2021-06-17 (×6): 20 mg via ORAL
  Filled 2021-06-12 (×6): qty 1

## 2021-06-12 NOTE — Progress Notes (Signed)
? ?Subjective:  ? ?Hospital day: 2 ? ?Overnight event: No overnight events. Patient is discharge orders were canceled yesterday due to not having assistance at home. ? ?This morning, patient was evaluated at the bedside sitting comfortably in bed eating breakfast. He had no acute complaints.  He denied any pain around his leg stump. Patient still does not feel safe about going home. ? ?Objective: ? ?Vital signs in last 24 hours: ?Vitals:  ? 06/12/21 0445 06/12/21 0500 06/12/21 0746 06/12/21 1124  ?BP: (!) 162/82  (!) 156/87 (!) 151/77  ?Pulse: 83  83 85  ?Resp: 19  18 18   ?Temp: 98.6 ?F (37 ?C)  98 ?F (36.7 ?C) 98.3 ?F (36.8 ?C)  ?TempSrc: Oral     ?SpO2: 96%  94% 97%  ?Weight:  134.3 kg    ?Height:      ? ? ?Filed Weights  ? 06/10/21 0510 06/11/21 0440 06/12/21 0500  ?Weight: 136.1 kg 134.8 kg 134.3 kg  ? ? ? ?Intake/Output Summary (Last 24 hours) at 06/12/2021 1318 ?Last data filed at 06/12/2021 1152 ?Gross per 24 hour  ?Intake 740 ml  ?Output 2450 ml  ?Net -1710 ml  ? ?Net IO Since Admission: -186.39 mL [06/12/21 1318] ? ?Recent Labs  ?  06/11/21 ?2017 06/12/21 ?0747 06/12/21 ?1122  ?GLUCAP 131* 124* 130*  ?  ? ?Pertinent Labs: ?CBC Latest Ref Rng & Units 06/12/2021 06/11/2021 06/10/2021  ?WBC 4.0 - 10.5 K/uL 7.7 7.1 13.9(H)  ?Hemoglobin 13.0 - 17.0 g/dL 11.0(L) 10.6(L) 12.8(L)  ?Hematocrit 39.0 - 52.0 % 33.3(L) 32.8(L) 37.8(L)  ?Platelets 150 - 400 K/uL 240 211 247  ? ? ?CMP Latest Ref Rng & Units 06/11/2021 06/10/2021 01/20/2021  ?Glucose 70 - 99 mg/dL 192(H) 317(H) -  ?BUN 8 - 23 mg/dL 22 24(H) -  ?Creatinine 0.61 - 1.24 mg/dL 1.15 1.70(H) -  ?Sodium 135 - 145 mmol/L 133(L) 132(L) 134(L)  ?Potassium 3.5 - 5.1 mmol/L 4.1 4.6 4.8  ?Chloride 98 - 111 mmol/L 99 95(L) -  ?CO2 22 - 32 mmol/L 26 24 -  ?Calcium 8.9 - 10.3 mg/dL 8.9 9.3 -  ?Total Protein 6.5 - 8.1 g/dL - 7.5 -  ?Total Bilirubin 0.3 - 1.2 mg/dL - 0.8 -  ?Alkaline Phos 38 - 126 U/L - 73 -  ?AST 15 - 41 U/L - 17 -  ?ALT 0 - 44 U/L - 13 -  ? ? ?Imaging: ?No  results found. ? ?Physical Exam ? ?General: Pleasant, well-appearing elderly man sitting in bed. No acute distress. ?CV: RRR. No m/r/g. No LE edema ?Pulmonary: Lungs CTAB. Normal effort. No wheezing or rales. ?Abdominal: Soft, nontender, nondistended. Normal bowel sounds. ?Extremities: Left BKA stump with 2 cm sinus tract with improved erythema and without any drainage.  No tenderness to palpation around the sinus tract. ?Skin: Warm and dry. No obvious rash or lesions. ?Neuro: A&Ox3.  Moves all extremities.  Normal sensation to gross touch. ?Psych: Normal mood and affect ? ? ?Assessment/Plan: ?Thomas Frye is a 66 y.o. 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 ? ?Principal Problem: ?  Cellulitis of left lower extremity without foot ?Active  Problems: ?  AKI (acute kidney injury) ([place]) ?  OSA (obstructive sleep apnea) ?  DM (diabetes mellitus), type 1 with peripheral vascular complications ([place]) ?  Pancreatic insufficiency ?  Infection of left below knee amputation ([place]) ?  Osteomyelitis of left tibia [facility]) ? ?#Osteomyelitis of left BKA stump  ?Patient has been on IV Rocephin and metronidazole since admission. IV vancomycin added after cultures grew Staphylococcus aureus. Patient remains afebrile with resolution of leukocytosis. Case has been discussed with patient's surgeon, Dr. Terrial Rhodes, who plans to see patient in the outpatient for reevaluation for further amputation versus continuation of antibiotics. Patient remains hemodynamically stable. We will transition to oral antibiotics after discharge.  PT/OT recommending SNF placement ?--Discontinue IV Vanc  and ceftriaxone ?--Start doxycycline 100 mg twice daily ?--Continue metronidazole 500 mg every 12 hours ?--As needed Tylenol for pain ?--GOC consulted to help with SNF placement ?--Scheduled to follow-up with Dr. Terrial Rhodes, vascular surgery on 3/22 at 2:30 PM ?  ?#DM1 with hyperglycemia, with complications of retinopathy, neuropathy and PVD  ?Patient likely type 2 diabetes but unclear type I versus type II. Patient on insulin pump at home.  Diabetic coordinator has reviewed patient's insulin pump and made recommendations. Patient has been on Levemir 54 units nightly with CBGs ranging from 124-236 during hospitalization.  ?--Continue Levemir 25 units nightly ?-Continue NovoLog 8 units with meals ,  ?--Continue SSI-moderate with meals and nightly correction ?  ?#AKI, resolved. ?Creatinine of 1.70 on admission.  This was thought to be prerenal in the setting of recent infection.  Creatinine improved to 1.15 with fluid resuscitation. ?-- Daily BMP ?--Encourage p.o. hydration ?  ?#OSA ?Continue CPAP at night.  ?--He will need to follow-up outpatient for home CPAP.  ?  ?#Pancreatic insufficiency ?- Continue pancreatic enzymes with meals ?  ?#HTN ?SBP has been in the 130s to 160s.  AKI has resolved.  We will resume patient's lisinopril. ?-- Resume lisinopril 20 mg daily ?  ?#MDD ?#GAD ?- Continue home medications venlafaxine XR, quetiapine, and amitriptyline. ?  ?#GERD ?- On omeprazole 40 mg twice daily at home. ?- Protonix 40 mg daily ?  ? ?Diet: Carb modified ?IVF: None ?VTE: Lovenox ?CODE: Full ? ?Prior to Admission Living Arrangement: Home ?Anticipated Discharge Location: SNF ?Barriers to Discharge: Pending SNF placement ?Dispo: Anticipated discharge in approximately 1-2 day(s).  ? ?Signed: ?Lacinda Axon, MD ?06/12/2021, 1:18 PM  ?Pager: [phone] ?Internal Medicine Teaching Service ?After 5pm on weekdays and 1pm on weekends: On Call pager: [phone] ? ?

## 2021-06-12 NOTE — Discharge Summary (Incomplete)
? ?Name: Thomas Frye ?MRN: [id number] ?DOB: 1955/12/25 66 y.o. ?PCP: [name], MD ? ?Date of Admission: 06/10/2021  5:04 AM ?Date of Discharge:  06/11/2021 ?Attending Physician: Dr. Angelia Mould ? ?DISCHARGE DIAGNOSIS:  ?Primary Problem: Cellulitis of left lower extremity without foot  ? ?Hospital Problems: ?Principal Problem: ?  Cellulitis and osteomyelitis of left lower extremity at BKA site ?Active Problems: ?  AKI (acute kidney injury) ([place]) ?  OSA (obstructive sleep apnea) ?  DM (diabetes mellitus) with peripheral vascular complications ([place]) ?  Pancreatic insufficiency ?  Infection of left below knee amputation ([place]) ?  ? ?DISCHARGE MEDICATIONS:  ? ?Allergies as of 06/12/2021   ? ?   Reactions  ? Latex Swelling  ? Sulfa Antibiotics Hives  ? ?  ? ?  ?Medication List  ?  ? ?TAKE these medications   ? ?acetaminophen 325 MG tablet ?Commonly known as: TYLENOL ?Take 650 mg by mouth every 6 (six) hours as needed for moderate pain. ?  ?amitriptyline 25 MG tablet ?Commonly known as: ELAVIL ?Take 25 mg by mouth at bedtime. ?  ?atorvastatin 10 MG tablet ?Commonly known as: LIPITOR ?Take 10 mg by mouth at bedtime. ?  ?clopidogrel 75 MG tablet ?Commonly known as: PLAVIX ?Take 75 mg by mouth daily. ?  ?doxazosin 1 MG tablet ?Commonly known as: CARDURA ?Take 0.5 tablets (0.5 mg total) by mouth daily. ?  ?doxycycline 100 MG tablet ?Commonly known as: VIBRA-TABS ?Take 1 tablet (100 mg total) by mouth 2 (two) times daily for 14 days. ?  ?ferrous sulfate 325 (65 FE) MG tablet ?Take 325 mg by mouth daily with breakfast. ?  ?FreeStyle Libre 14 Day Reader Kerrin Mo ?by Does not apply route. ?  ?insulin aspart 100 UNIT/ML injection ?Commonly known as: novoLOG ?CHECK YOUR CBG 3 TIMES A DAY WITH MEALS AND USE A SLIDING SCALE AS FOLLOWS: ?For CBG 121-150 inject 1unit of insulin ?For CBG 151-200 inject 2units of insulin ?For CBG 201-250 inject 3units of insulin  ?For CBG 251-300 inject 5units of insulin ?For CBG 301-350 inject  7units of insulin ?For CBG 351-400 inject 9units of insulin ?For CBG >400 call your MD ?  ?insulin regular 100 units/mL injection ?Commonly known as: NOVOLIN R ?follow insulin pump, Soln, Subcutaneous, As Indicated, 10 mL, 0 Refill(s), Route to Pharmacy Electronically, [facility], [place], 06/20/20 6:38:00 EDT, Height/Length Dosing, cm, 140.6, 06/20/20 6:38:00 EDT, Weight Dosing, kg ?  ?lisinopril 20 MG tablet ?Commonly known as: ZESTRIL ?Take 20 mg by mouth daily. ?  ?loperamide 2 MG capsule ?Commonly known as: IMODIUM ?Take by mouth as needed for diarrhea or loose stools. ?  ?metoCLOPramide 5 MG tablet ?Commonly known as: Reglan ?Take 1 tablet (5 mg total) by mouth 4 (four) times daily. ?  ?omeprazole 40 MG capsule ?Commonly known as: PRILOSEC ?Take 40 mg by mouth 2 (two) times daily. ?  ?Omnipod DASH Pods (Gen 4) Misc ?USE 1 POD EVERY 2 DAYS AS NEEDED ?  ?Omnipod DASH Pods (Gen 4) Misc ?Inject into the skin. ?  ?Pancrelipase (Lip-Prot-Amyl) 25000-79000 units Cpep ?Take 1-2 capsules by mouth See admin instructions. 2 capsules  with meals  ?1 capsule with snacks ?  ?pramipexole 0.125 MG tablet ?Commonly known as: MIRAPEX ?Take 0.125 mg by mouth at bedtime. ?  ?PROBIOTIC PO ?Take 1 tablet by mouth daily. ?  ?QUEtiapine 50 MG tablet ?Commonly known as: SEROQUEL ?Take 50 mg by mouth at bedtime. ?  ?Tyler Aas FlexTouch 100 UNIT/ML FlexTouch Pen ?Generic  drug: insulin degludec ?Inject 60 Units into the skin daily. ?  ?venlafaxine XR 150 MG 24 hr capsule ?Commonly known as: EFFEXOR-XR ?Take 150 mg by mouth daily with breakfast. ?  ?vitamin E 1000 UNIT capsule ?Take 1,000 Units by mouth in the morning and at bedtime. ?  ? ?  ? ?  ?  ? ? ?  ?Durable Medical Equipment  ?(From admission, onward)  ?  ? ? ?  ? ?  Start     Ordered  ? 06/11/21 1438  For home use only DME 3 n 1  Once       ? 06/11/21 1437  ? ?  ?  ? ?  ? ? ?  ?Discharge Care Instructions  ?(From admission, onward)  ?  ? ? ?  ? ?  Start     Ordered   ? 06/11/21 0000  Change dressing (specify)       ?Comments: Dressing change: Change dressing daily  ? 06/11/21 1629  ? ?  ?  ? ?  ? ? ?DISPOSITION AND FOLLOW-UP:  ?Thomas Frye was discharged from [facility] in Stable condition. At the hospital follow up visit please address: ? ?#Moderate nonpurulent cellulitis of left stump with wound sinus ?#L tibia osteomyelitis  ?Ensure patient is taking doxycycline as prescribed. Recheck CBC and monitor for signs of systemic infection. Follow-up blood culture, wound cultures. ?  ?#AKI ?Recheck BMP and monitor for continued improvement of AKI. ? ?Follow-up Recommendations: ?Consults: Orthopedic surgery ?Labs: Basic Metabolic Profile and CBC ?Studies: None ?Medications:  ? ?START: ?Doxycycline 100 mg twice daily for two weeks ?No other medication changes were made. ? ?Follow-up Appointments: ? Follow-up Information   ? ? [name], MD Follow up on 06/16/2021.   ?Specialty: Vascular Surgery ?Why: At 2:30 PM ?Contact information: ?[address] ?[address] ?[place] [address] ?[phone] ? ? ?  ?  ? ? [name], MD. Schedule an appointment as soon as possible for a visit in 1 week(s).   ?Specialty: Geriatric Medicine ?Contact information: ?[address] ?[place] [address] ?[phone] ? ? ?  ?  ? ?  ?  ? ?  ? ? ?HOSPITAL COURSE:  ?Patient Summary: ?#Moderate nonpurulent cellulitis of left stump with wound sinus ?#L tibia osteomyelitis  ?Patient presented with draining wound over L BKA stump. ESR and CRP were elevated but patient was hemodynamically stable without fever or leukocytosis. MRI showed open wound involving the distal aspect of the left lower extremity anteriorly near the amputation site, associated cellulitis and myofasciitis, no discrete rim enhancing abscess is identified; osteomyelitis involving the tibia near the amputation site; no findings for septic arthritis at the knee joint.  Wound culture showing few staph aureus and blood culture shows NGTD. He was started on ceftriaxone and metronidazole on admission and vancomycin was added 03/17. He was hemodynamically stable without fever at time of discharge. ? ?#Type 1 diabetes mellitus with retinopathy, neuropathy, and PVD  ?Patient was hyperglycemic on presentation. He was started on Levemir 54 units nightly and 8 units with meals as well as SSI and nightly insulin correction; he did have a few hypoglycemic episodes which corrected with food and juice. ?  ?#AKI ?Admission labs showed creatinine of 1.70, increased from baseline of around 1.0-1.10. Overnight this improved to 1.15. Home lisinopril was held. ?  ?#OSA ?CPAP at night. ?  ?#Pancreatic insufficiency ?Continued home pancreatic enzymes with meals.  ?  ?#HTN ?Patient was  normotensive throughout admission. Home lisinopril was held.  ?  ?#MDD ?#GAD ?Continued home venlafaxine XR, quetiapine, and amitriptyline. ?  ?#GERD ?Managed with Protonix 40 mg daily.  ? ?DISCHARGE INSTRUCTIONS:  ? ?Discharge Instructions   ? ? Change dressing (specify)   Complete by: As directed ?  ? Dressing change: Change dressing daily  ? Diet - low sodium heart healthy   Complete by: As directed ?  ? Increase activity slowly   Complete by: As directed ?  ? ?  ?Thomas Frye,  ? ?It was a pleasure taking care of you at [facility] were admitted for an infection of your amputation site and treated for osteomyelitis. We are discharging you home now that you are doing better. We have spoken with Dr. Terrial Rhodes and he is aware of your hospitalization and discharge today.  Please follow the following instructions.  ?1) Follow up with Dr. Terrial Rhodes on Wednesday, March 22nd at 2:30 pm ?2) Start taking Doxycycline 100 mg twice daily for two weeks ?3) Call Dr. Harriet Masson office if you start having fevers, chills or worsening pain. If you are unable to reach him, go to your nearest ER.  ? ?Take care,  ? ?Dr. Linwood Dibbles, MD, MPH  ? ?SUBJECTIVE:  ?Patient evaluated at bedside on day of discharge. He seems well and has no complaints. He is understandably worried about needing further amputation. ? ?Discharge Vitals:

## 2021-06-12 NOTE — Progress Notes (Signed)
Physical Therapy Treatment ?Patient Details ?Name: Thomas Frye ?MRN: [id number] ?DOB: 1956/01/22 ?Today's Date: 06/12/2021 ? ? ?History of Present Illness Pt is a 66 y/o male presenting on 3/16 with L BKA wound, recent fall out of bed.  MRI with cellulitis, osteomyelitis involving the tibia. 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 ? ?  ?PT Comments  ? ? PTA returned to room and pt in supine after NT assisted him off bed pan and gave him a bath, pt agreeable for further mobility progression. Pt able to perform bed mobility with up to min guard and needs mod to maxA for sit<>stand from elevated bed height to RW and for attempted pre-gait activity at bedside. Pt quick to fatigue and unable to progress gait with RW or safely stand pivot with +1 maxA so pt instead sat and performed lateral seated scoot from elevated bed height to drop arm recliner with minA and transfer pad assist. Pt needing cues for sequencing/safety throughout. Pt continues to benefit from PT services to progress toward functional mobility goals, continue to recommend SNF. Pt reports moderate fatigue (5/10 RPE) at end of session.   ?Recommendations for follow up therapy are one component of a multi-disciplinary discharge planning process, led by the attending physician.  Recommendations may be updated based on patient status, additional functional criteria and insurance authorization. ? ?Follow Up Recommendations ? Skilled nursing-short term rehab (<3 hours/day) ?  ?  ?Assistance Recommended at Discharge Frequent or constant Supervision/Assistance  ?Patient can return home with the following A lot of help with walking and/or transfers;A lot of help with bathing/dressing/bathroom;Assistance with cooking/housework;Assist for transportation;Help with stairs or ramp for entrance ?  ?Equipment  Recommendations ? Other (comment) (drop arm BSC, slide board)  ?  ?Recommendations for Other Services   ? ? ?  ?Precautions / Restrictions Precautions ?Precautions: Fall ?Precaution Comments: L BKA with cellulitis and wound to distal end of residual limb ?Restrictions ?Weight Bearing Restrictions: No ?Other Position/Activity Restrictions: No orders in, assume protective NWB to L residual limb due to wound, not safe to use prosthetic at this time  ?  ? ?Mobility ? Bed Mobility ?Overal bed mobility: Needs Assistance ?Bed Mobility: Rolling, Sidelying to Sit ?Rolling: Supervision ?Sidelying to sit: Min guard, HOB elevated ?  ?  ?  ?General bed mobility comments: HOB elevated per home setup, pt able to perform with increased time and no rail ?  ? ?Transfers ?Overall transfer level: Needs assistance ?Equipment used: Rolling walker (2 wheels) ?Transfers: Sit to/from Stand, Bed to chair/wheelchair/BSC ?Sit to Stand: From elevated surface, Mod assist ?  ?  ?  ?  ? Lateral/Scoot Transfers: Min assist, From elevated surface ?General transfer comment: Pt attempted to stand from lowest bed height to RW but unable to achieve upright with maxA; attempted from bed height elevated 1 foot higher and able to stand with momentum and modA to RW; pt unable to safely stand pivot to chair at bedside due to fatigue after sit>stand so instead performed seated lateral scoot from bed>drop arm chair. Pt reports he needs bed height elevated in order to get in to drop arm recliner. MinA for safety with bed pad and mod cues for safe technique and hand placement. ?  ? ?Ambulation/Gait ?  ?  ?  ?  ?  ?  ?Pre-gait activities: pt able to minimally raise R heel and toes for standing  heel/toe raise and single mini squat. Pt able to perform 1 backward shuffled step with RLE and RW support but unable to hop or safely pivot to chair while standing ?  ? ? ?Stairs ?  ?  ?  ?  ?  ? ? ?Wheelchair Mobility ?  ? ?Modified Rankin (Stroke Patients Only) ?  ? ? ?   ?Balance Overall balance assessment: Needs assistance ?Sitting-balance support: No upper extremity supported, Feet supported ?Sitting balance-Leahy Scale: Good ?  ?  ?Standing balance support: Bilateral upper extremity supported, Reliant on assistive device for balance ?Standing balance-Leahy Scale: Poor ?Standing balance comment: min to modA for static standing at RW (prosthetic NOT donned due to LLE wound), mod to maxA for dynamic standing tasks ?  ?  ?  ?  ?  ?  ?  ?  ?  ?  ?  ?  ? ?  ?Cognition Arousal/Alertness: Awake/alert ?Behavior During Therapy: [facility] for tasks assessed/performed ?Overall Cognitive Status: Within Functional Limits for tasks assessed ?Area of Impairment: Problem solving ?  ?  ?  ?  ?  ?  ?  ?  ?  ?  ?  ?  ?  ?  ?Problem Solving: Slow processing ?General Comments: anticipate baseline, pt cooperative and appreciative of staff assist ?  ?  ? ?  ?Exercises Other Exercises ?Other Exercises: standing RLE: heel/toe raises x3 reps ?Other Exercises: seated RLE AROM: LAQ x10 reps ?Other Exercises: STS x1 ? ?  ?General Comments General comments (skin integrity, edema, etc.): VSS on RA, no acute s/sx distress ?  ?  ? ?Pertinent Vitals/Pain Pain Assessment ?Pain Assessment: No/denies pain  ? ? ?Home Living   ?  ?  ?  ?  ?  ?  ?  ?  ?  ?   ?  ?Prior Function    ?  ?  ?   ? ?PT Goals (current goals can now be found in the care plan section) Acute Rehab PT Goals ?Patient Stated Goal: to get stronger before I go home so I can walk to my bathroom since my WC won't fit in bathroom or bedroom ?PT Goal Formulation: With patient ?Time For Goal Achievement: 06/25/21 ?Progress towards PT goals: Progressing toward goals ? ?  ?Frequency ? ? ? Min 3X/week ? ? ? ?  ?PT Plan Current plan remains appropriate  ? ? ?Co-evaluation   ?  ?  ?  ?  ? ?  ?AM-PAC PT "6 Clicks" Mobility   ?Outcome Measure ? Help needed turning from your back to your side while in a flat bed without using bedrails?: A Little ?Help needed moving from  lying on your back to sitting on the side of a flat bed without using bedrails?: A Little ?Help needed moving to and from a bed to a chair (including a wheelchair)?: A Little ?Help needed standing up from a chair using your arms (e.g., wheelchair or bedside chair)?: A Lot ?Help needed to walk in hospital room?: Total ?Help needed climbing 3-5 steps with a railing? : Total ?6 Click Score: 13 ? ?  ?End of Session Equipment Utilized During Treatment: Gait belt ?Activity Tolerance: Patient tolerated treatment well ?Patient left: with call bell/phone within reach;with family/visitor present;Other (comment);in chair (chair alarm not in room, NT notified, pad under him in chair but he needs the green box to comm with unit secretary) ?Nurse Communication: Mobility status;Other (comment);Precautions (bed broken, I tagged it, unit secretary notified (hydraulics and headboard broken)) ?PT  Visit Diagnosis: Other abnormalities of gait and mobility (R26.89);Difficulty in walking, not elsewhere classified (R26.2) ?  ? ? ?Time: [id number] ?PT Time Calculation (min) (ACUTE ONLY): 30 min ? ?Charges:  $Therapeutic Exercise: 8-22 mins ?$Therapeutic Activity: 8-22 mins          ?          ? ?Opal Dinning P., PTA ?Acute Rehabilitation Services ?Pager: [phone] ?Office: [phone]  ? ? ?Analaya Hoey M Gloris Shiroma ?06/12/2021, 12:32 PM ? ?

## 2021-06-12 NOTE — [place] FL2 (Signed)
?[place] MEDICAID FL2 LEVEL OF CARE SCREENING TOOL  ?  ? ?IDENTIFICATION  ?Patient Name: ?Thomas Frye Birthdate: 23-Nov-1955 Sex: male Admission Date (Current Location): ?06/10/2021  ?[place] Number: ? [place] ?  Facility and Address:  ?[facility], [address], [place], [place] [address] ?     Provider Number: ?[id number]  ?Attending Physician Name and Address:  ?Gust Rung, DO ? Relative Name and Phone Number:  ?Sharron,Christy Spouse [phone] ?   ?Current Level of Care: ?Hospital Recommended Level of Care: ?Skilled Nursing Facility Prior Approval Number: ?  ? ?Date Approved/Denied: ?  PASRR Number: ?[id number] A ? ?Discharge Plan: ?SNF ?  ? ?Current Diagnoses: ?Patient Active Problem List  ? Diagnosis Date Noted  ? Osteomyelitis of left tibia [facility])   ? Cellulitis of left lower extremity without foot 06/10/2021  ? DM (diabetes mellitus), type 1 with peripheral vascular complications ([facility]) 06/10/2021  ? Pancreatic insufficiency 06/10/2021  ? 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 DKA (diabetic ketoacidoses) 10/06/2015  ? Hyperlipidemia 10/06/2015  ? GERD (gastroesophageal reflux disease) 10/06/2015  ? AKI (acute kidney injury) ([facility]) 10/06/2015  ? OSA (obstructive sleep apnea) 10/06/2015  ? Acute renal insufficiency   ? Nausea & vomiting 12/11/2012  ? 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.2) 10/05/2012  ? 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 Anoxic encephalopathy ([facility]) 08/30/2012  ? ? 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 Acute respiratory failure in setting of profound hypoglycemia 08/18/2012  ? Severe diabetic hypoglycemia ([facility]) 08/18/2012  ? DM (diabetes mellitus), type 2 with peripheral vascular complications ([facility]) 08/18/2012  ? Acute encephalopathy 08/18/2012  ? HTN (hypertension) 08/18/2012  ?  Noncompliance 08/18/2012  ? ? ?Orientation RESPIRATION BLADDER Height & Weight   ?  ?Self, Time, Situation, Place ? Normal Continent Weight: 296 lb 1.2 oz (134.3 kg) ?Height:  6\' 3"  (190.5 cm)  ?BEHAVIORAL SYMPTOMS/MOOD NEUROLOGICAL BOWEL NUTRITION STATUS  ?    Continent Diet (see discharge summary)  ?AMBULATORY STATUS COMMUNICATION OF NEEDS Skin   ?Total Care Verbally Other (Comment) (wound to stump) ?  ?  ?  ?    ?     ?     ? ? ?Personal Care Assistance Level of Assistance  ?Bathing, Feeding, Dressing Bathing Assistance: Limited assistance ?Feeding assistance: Limited assistance ?Dressing Assistance: Limited assistance ?   ? ?Functional Limitations Info  ?Sight, Hearing, Speech Sight Info: Adequate ?Hearing Info: Adequate ?Speech Info: Adequate  ? ? ?SPECIAL CARE FACTORS FREQUENCY  ?PT (By licensed PT), OT (By licensed OT)   ?  ?PT Frequency: 5x week ?OT Frequency: 5x week ?  ?  ?  ?   ? ? ?Contractures Contractures Info: Not present  ? ? ?Additional Factors Info  ?Code Status, Allergies, Insulin Sliding Scale Code Status Info: full ?Allergies Info: Latex, Sulfa Antibiotics ?  ?Insulin Sliding Scale Info: novolog.  see discharge summary ?  ?   ? ?Current Medications (06/12/2021):  This is the current hospital active medication list ?Current Facility-Administered Medications  ?Medication Dose Route Frequency Provider Last Rate Last Admin  ? acetaminophen (TYLENOL) tablet 650 mg  650 mg Oral Q6H PRN 06/14/2021, MD      ? Or  ? acetaminophen (TYLENOL) suppository 650 mg  650 mg Rectal Q6H PRN Albertha Ghee, MD      ? amitriptyline (ELAVIL) tablet 25  mg  25 mg Oral QHS Albertha Ghee, MD   25 mg at 06/11/21 2107  ? bisacodyl (DULCOLAX) EC tablet 5 mg  5 mg Oral Daily PRN Albertha Ghee, MD      ? clopidogrel (PLAVIX) tablet 75 mg  75 mg Oral Daily Albertha Ghee, MD   75 mg at 06/12/21 0831  ? doxycycline (VIBRA-TABS) tablet 100 mg  100 mg Oral Q12H Steffanie Rainwater, MD      ? enoxaparin (LOVENOX) injection 40 mg  40 mg  Subcutaneous Q24H Albertha Ghee, MD   40 mg at 06/12/21 1211  ? ferrous sulfate tablet 325 mg  325 mg Oral Q breakfast Albertha Ghee, MD   325 mg at 06/12/21 0831  ? insulin aspart (novoLOG) injection 0-15 Units  0-15 Units Subcutaneous TID WC Albertha Ghee, MD   2 Units at 06/12/21 1213  ? insulin aspart (novoLOG) injection 0-5 Units  0-5 Units Subcutaneous QHS Albertha Ghee, MD      ? insulin aspart (novoLOG) injection 8 Units  8 Units Subcutaneous TID WC Albertha Ghee, MD   8 Units at 06/12/21 1214  ? insulin detemir (LEVEMIR) injection 25 Units  25 Units Subcutaneous QHS Rehman, Areeg N, DO   25 Units at 06/11/21 2107  ? lipase/protease/amylase (CREON) capsule 24,000 Units  24,000 Units Oral TID PRN Doristine Counter, RPH      ? lipase/protease/amylase (CREON) capsule 48,000 Units  48,000 Units Oral TID AC Doristine Counter, RPH   48,000 Units at 06/12/21 1210  ? lisinopril (ZESTRIL) tablet 20 mg  20 mg Oral Daily Steffanie Rainwater, MD      ? metroNIDAZOLE (FLAGYL) tablet 500 mg  500 mg Oral Q8H Albertha Ghee, MD   500 mg at 06/12/21 1334  ? pantoprazole (PROTONIX) EC tablet 40 mg  40 mg Oral Daily Albertha Ghee, MD   40 mg at 06/12/21 0831  ? pramipexole (MIRAPEX) tablet 0.125 mg  0.125 mg Oral QHS Albertha Ghee, MD   0.125 mg at 06/11/21 2107  ? QUEtiapine (SEROQUEL) tablet 50 mg  50 mg Oral QHS Albertha Ghee, MD   50 mg at 06/11/21 2107  ? senna-docusate (Senokot-S) tablet 1 tablet  1 tablet Oral QHS PRN Albertha Ghee, MD      ? venlafaxine XR (EFFEXOR-XR) 24 hr capsule 150 mg  150 mg Oral Q breakfast Albertha Ghee, MD   150 mg at 06/12/21 0831  ? vitamin E capsule 1,000 Units  1,000 Units Oral Daily Albertha Ghee, MD   1,000 Units at 06/12/21 0831  ? ? ? ?Discharge Medications: ?Please see discharge summary for a list of discharge medications. ? ?Relevant Imaging Results: ? ?Relevant Lab Results: ? ? ?Additional Information ?SSN: [id number]. Pt is vaccinated for covid with one or two boosters. ? ?Lorri Frederick,  LCSW ? ? ? ? ?

## 2021-06-12 NOTE — Progress Notes (Addendum)
Physical Therapy Treatment ?Patient Details ?Name: Thomas Frye ?MRN: [id number] ?DOB: 1955/05/22 ?Today's Date: 06/12/2021 ? ? ?History of Present Illness Pt is a 66 y/o male presenting on 3/16 with L BKA wound, recent fall out of bed.  MRI with cellulitis, osteomyelitis involving the tibia. 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 ? ?  ?PT Comments  ? ? Pt received in supine, agreeable to therapy session and with good participation, limited due to need for BM. Pt reports he is unable to hold bowels long enough for transfer OOB to [facility], so instead rolled to L/R for insertion of bed pan with up to min guard assist for proper placement. Pt performed LLE AROM prior to getting on bed pan. Plan to return post-bath for further mobility progression, per chart review pt unable to get high intensity post-acute rehab placement and will need short term low intensity post-acute rehab to prepare prior to DC home. Pt is unable to fit his wheelchair in his bedroom or bathroom and therefore unsafe to DC home as spouse has LVAD and cannot safely assist him to lift up onto [facility]. Pt continues to benefit from PT services to progress toward functional mobility goals.    ?Recommendations for follow up therapy are one component of a multi-disciplinary discharge planning process, led by the attending physician.  Recommendations may be updated based on patient status, additional functional criteria and insurance authorization. ? ?Follow Up Recommendations ? Skilled nursing-short term rehab (<3 hours/day) ?  ?  ?Assistance Recommended at Discharge Frequent or constant Supervision/Assistance  ?Patient can return home with the following A lot of help with walking and/or transfers;A lot of help with bathing/dressing/bathroom;Assistance with cooking/housework;Assist for transportation;Help with  stairs or ramp for entrance ?  ?Equipment Recommendations ? Other (comment) (drop arm BSC, slide board)  ?  ?Recommendations for Other Services   ? ? ?  ?Precautions / Restrictions Precautions ?Precautions: Fall ?Precaution Comments: L BKA with cellulitis and wound to distal end of residual limb ?Restrictions ?Weight Bearing Restrictions: No ?Other Position/Activity Restrictions: protective NWB to L residual limb due to wound, not safe to use prosthetic at this time  ?  ? ?Mobility ? Bed Mobility ?Overal bed mobility: Needs Assistance ?Bed Mobility: Rolling ?Rolling: Supervision, Min guard ?  ?  ?  ?  ?General bed mobility comments: supervision to roll to L, pt needs min guard to roll to R for placement of bed pan (to roll further onto hip) ?  ? ?  ? ?  ?   ?Cognition Arousal/Alertness: Awake/alert ?Behavior During Therapy: [facility] for tasks assessed/performed ?Overall Cognitive Status: Within Functional Limits for tasks assessed ?Area of Impairment: Problem solving ?  ?  ?  ?  ?  ?  ?  ?  ?  ?  ?  ?  ?  ?  ?Problem Solving: Slow processing ?General Comments: anticipate baseline, pt cooperative and appreciative of staff assist ?  ?  ? ?  ?Exercises Other Exercises ?Other Exercises: LLE AROM: hip flexion, hip abduction, knee flex/ext x5 reps ea ? ?  ?General Comments General comments (skin integrity, edema, etc.): no dizziness reported, VSS on RA per chart review, no acute s/sx distress ?  ?  ? ?Pertinent Vitals/Pain Pain Assessment ?Pain Assessment: No/denies pain  ? ? ?Home Living   ?Prior Function   ? ?PT Goals (current goals can now be found in  the care plan section) Acute Rehab PT Goals ?Patient Stated Goal: to get stronger before I go home so I can walk to my bathroom since my WC won't fit in bathroom or bedroom ?PT Goal Formulation: With patient ?Time For Goal Achievement: 06/25/21 ?Progress towards PT goals: Progressing toward goals ? ?  ?Frequency ? ? ? Min 3X/week ? ? ? ?  ?PT Plan Discharge plan needs to be  updated;Equipment recommendations need to be updated  ? ? ?   ?AM-PAC PT "6 Clicks" Mobility   ?Outcome Measure ? Help needed turning from your back to your side while in a flat bed without using bedrails?: A Little ?Help needed moving from lying on your back to sitting on the side of a flat bed without using bedrails?: A Little ?Help needed moving to and from a bed to a chair (including a wheelchair)?: A Little ?Help needed standing up from a chair using your arms (e.g., wheelchair or bedside chair)?: A Lot ?Help needed to walk in hospital room?: Total ?Help needed climbing 3-5 steps with a railing? : Total ?6 Click Score: 13 ? ?  ?End of Session   ?Activity Tolerance: Patient tolerated treatment well;Other (comment) (bowel urgency limiting session time, plan to return for second session) ?Patient left: in bed;with call bell/phone within reach;with family/visitor present;Other (comment) (pt on bed pan) ?Nurse Communication: Mobility status;Other (comment) (pt on bed pan he was able to demo back use of call bell when done. he also needs a bath.) ?PT Visit Diagnosis: Other abnormalities of gait and mobility (R26.89);Difficulty in walking, not elsewhere classified (R26.2) ?  ? ? ?Time: 0388-8280 ?PT Time Calculation (min) (ACUTE ONLY): 8 min ? ?Charges:  $Therapeutic Activity: 8-22 mins          ?          ? ?Thomas Notz P., Thomas Frye ?Acute Rehabilitation Services ?Pager: [phone] ?Office: [phone]  ? ? ?Amos Gaber M Rosalina Dingwall ?06/12/2021, 12:14 PM ? ?

## 2021-06-12 NOTE — TOC Initial Note (Signed)
Transition of Care (TOC) - Initial/Assessment Note  ? ? ?Patient Details  ?Name: Thomas Frye ?MRN: [id number] ?Date of Birth: [birth year] ? ?Transition of Care [facility]) CM/SW Contact:    ?Joanne Chars, LCSW ?Phone Number: ?06/12/2021, 2:15 PM ? ?Clinical Narrative:     CSW met with pt and [name] regarding DC plan, permission given to speak with wife present.  Discussed difference between CIR and SNF.  Pt and wife live in small house and were worried that pt was going to be sent home.  They  are agreeable to SNF but would  like to be in the [place] area if possible, due to pt have medical appts in [place].  [place] and [place] are also areas they would consider, but hoping for [place].  Choice document given, permission given to send out referral in hub.  Pt is vaccinated for covid with one or two boosters. ? ?Referral sent out in hub for SNF.        ? ? ?Expected Discharge Plan: [place] ?Barriers to Discharge: SNF Pending bed offer ? ? ?Patient Goals and CMS Choice ?Patient states their goals for this hospitalization and ongoing recovery are:: walking, get back to normal ?CMS Medicare.gov Compare Post Acute Care list provided to:: Patient ?Choice offered to / list presented to : Patient ? ?Expected Discharge Plan and Services ?Expected Discharge Plan: [place] ?In-house Referral: Clinical Social Work ?  ?Post Acute Care Choice: [place] ?Living arrangements for the past 2 months: [place] ?Expected Discharge Date: 06/11/21               ?  ?  ?  ?  ?  ?  ?  ?  ?  ?  ? ?Prior Living Arrangements/Services ?Living arrangements for the past 2 months: [place] ?Lives with:: Spouse ?Patient language and need for interpreter reviewed:: Yes ?Do you feel safe going back to the place where you live?: Yes      ?Need for Family Participation in Patient Care: No (Comment) ?Care giver support system in place?: Yes (comment) ?Current  home services: Other (comment) (none) ?Criminal Activity/Legal Involvement Pertinent to Current Situation/Hospitalization: No - Comment as needed ? ?Activities of Daily Living ?  ?  ? ?Permission Sought/Granted ?Permission sought to share information with : Family Supports ?Permission granted to share information with : Yes, Verbal Permission Granted ? Share Information with NAME: [name] ? Permission granted to share info w AGENCY: SNF ?   ?   ? ?Emotional Assessment ?Appearance:: Appears stated age ?Attitude/Demeanor/Rapport: Engaged ?Affect (typically observed): Appropriate, Pleasant ?Orientation: : Oriented to Self, Oriented to Place, Oriented to  Time, Oriented to Situation ?Alcohol / Substance Use: Not Applicable ?Psych Involvement: No (comment) ? ?Admission diagnosis:  Hyponatremia [E87.1] ?Renal insufficiency [N28.9] ?Hypoxia [R09.02] ?Normochromic normocytic anemia [D64.9] ?Cellulitis of left lower leg [L03.116] ?Cellulitis of left lower extremity without foot [L03.116] ?Patient Active Problem List  ? Diagnosis Date Noted  ? Osteomyelitis of left tibia [facility])   ? Cellulitis of left lower extremity without foot 06/10/2021  ? DM (diabetes mellitus), type 1 with peripheral vascular complications ([place]) 64/15/8309  ? Pancreatic insufficiency 06/10/2021  ? Infection of left below knee amputation ([place]) 06/10/2021  ? DKA (diabetic ketoacidoses) 10/06/2015  ? Hyperlipidemia 10/06/2015  ? GERD (gastroesophageal reflux disease) 10/06/2015  ? AKI (acute kidney injury) ([place]) 10/06/2015  ? OSA (obstructive sleep apnea) 10/06/2015  ? Acute renal insufficiency   ?  Nausea & vomiting 12/11/2012  ? 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.2) 10/05/2012  ? 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 Anoxic encephalopathy ([place]) 08/30/2012  ? ? 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 Acute respiratory failure in setting of  profound hypoglycemia 08/18/2012  ? Severe diabetic hypoglycemia ([place]) 08/18/2012  ? DM (diabetes mellitus), type 2 with peripheral vascular complications ([place]) 33/38/3291  ? Acute encephalopathy 08/18/2012  ? HTN (hypertension) 08/18/2012  ? Noncompliance 08/18/2012  ? ?PCP:  [name], MD ?Pharmacy:   ?[facility] [id number] ?[address] ?[place] [address] ?Phone: [phone] Fax: [phone] ? ? ? ? ?Social Determinants of Health (SDOH) Interventions ?  ? ?Readmission Risk Interventions ?No flowsheet data found. ? ? ?

## 2021-06-13 LAB — GLUCOSE, CAPILLARY
Glucose-Capillary: 244 mg/dL — ABNORMAL HIGH (ref 70–99)
Glucose-Capillary: 249 mg/dL — ABNORMAL HIGH (ref 70–99)
Glucose-Capillary: 254 mg/dL — ABNORMAL HIGH (ref 70–99)
Glucose-Capillary: 237 mg/dL — ABNORMAL HIGH (ref 70–99)
Glucose-Capillary: 110 mg/dL — ABNORMAL HIGH (ref 70–99)

## 2021-06-13 MED ORDER — INSULIN DETEMIR 100 UNIT/ML ~~LOC~~ SOLN
30.0000 [IU] | Freq: Every day | SUBCUTANEOUS | Status: DC
  Administered 2021-06-13: 30 [IU] via SUBCUTANEOUS
  Filled 2021-06-13 (×2): qty 0.3

## 2021-06-13 MED ORDER — INSULIN ASPART 100 UNIT/ML IJ SOLN
10.0000 [IU] | Freq: Three times a day (TID) | INTRAMUSCULAR | Status: DC
  Administered 2021-06-13 – 2021-06-15 (×6): 10 [IU] via SUBCUTANEOUS

## 2021-06-13 MED ORDER — AMLODIPINE BESYLATE 10 MG PO TABS
10.0000 mg | ORAL_TABLET | Freq: Every day | ORAL | Status: DC
  Administered 2021-06-13 – 2021-06-17 (×5): 10 mg via ORAL
  Filled 2021-06-13 (×5): qty 1

## 2021-06-13 NOTE — Progress Notes (Signed)
? ?Subjective:  ? ?HD#2 ?Patient Summary: Thomas Frye is a 66 y.o. 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  ? ?Overnight event: No overnight events reported.  ? ?Interim History: Patient evaluated at bedside this morning. He is sitting at edge of bed. No acute concerns at this time.  ? ?Objective: ? ?Vital signs in last 24 hours: ?Vitals:  ? 06/12/21 1656 06/12/21 2148 06/13/21 0500 06/13/21 0530  ?BP: (!) 157/84 (!) 150/78  (!) 158/79  ?Pulse: 88 89  91  ?Resp: 18 19  18   ?Temp: 98.2 ?F (36.8 ?C) 98.5 ?F (36.9 ?C)  97.9 ?F (36.6 ?C)  ?TempSrc: Oral Oral    ?SpO2: 94% 94%  92%  ?Weight:   (!) 136.4 kg   ?Height:      ? ? ?Filed Weights  ? 06/11/21 0440 06/12/21 0500 06/13/21 0500  ?Weight: 134.8 kg 134.3 kg (!) 136.4 kg  ? ? ? ?Intake/Output Summary (Last 24 hours) at 06/13/2021 0713 ?Last data filed at 06/12/2021 1656 ?Gross per 24 hour  ?Intake 240 ml  ?Output 2050 ml  ?Net -1810 ml  ? ?Net IO Since Admission: -1,086.39 mL [06/13/21 0713] ? ?Recent Labs  ?  06/12/21 ?1649 06/12/21 ?2145 06/13/21 ?[id number]  ?GLUCAP 199* 204* 244*  ?  ? ?Pertinent Labs: ?CBC Latest Ref Rng & Units 06/12/2021 06/11/2021 06/10/2021  ?WBC 4.0 - 10.5 K/uL 7.7 7.1 13.9(H)  ?Hemoglobin 13.0 - 17.0 g/dL 11.0(L) 10.6(L) 12.8(L)  ?Hematocrit 39.0 - 52.0 % 33.3(L) 32.8(L) 37.8(L)  ?Platelets 150 - 400 K/uL 240 211 247  ? ? ?CMP Latest Ref Rng & Units 06/11/2021 06/10/2021 01/20/2021  ?Glucose 70 - 99 mg/dL 192(H) 317(H) -  ?BUN 8 - 23 mg/dL 22 24(H) -  ?Creatinine 0.61 - 1.24 mg/dL 1.15 1.70(H) -  ?Sodium 135 - 145 mmol/L 133(L) 132(L) 134(L)  ?Potassium 3.5 - 5.1 mmol/L 4.1 4.6 4.8  ?Chloride 98 -  111 mmol/L 99 95(L) -  ?CO2 22 - 32 mmol/L 26 24 -  ?Calcium 8.9 - 10.3 mg/dL 8.9 9.3 -  ?Total Protein 6.5 - 8.1 g/dL - 7.5 -  ?Total Bilirubin 0.3 - 1.2 mg/dL - 0.8 -  ?Alkaline Phos 38 - 126 U/L - 73 -  ?AST 15 - 41 U/L - 17 -  ?ALT 0 - 44 U/L - 13 -  ? ? ?Imaging: ?No results found. ? ?Physical Exam ?General: Pleasant, well-appearing elderly man sitting on edge of bed. No acute distress. ?CV: RRR. No m/r/g. No LE edema ?Pulmonary: Lungs CTAB. Normal effort. No wheezing or rales. ?Extremities: Left BKA stump with 2 cm sinus tract without any drainage. ?Skin: Warm and dry. No obvious rash or lesions. ?Neuro: A&Ox3.  Moves all extremities.  Normal sensation to gross touch. ?Psych: Normal mood and affect ? ? ?Assessment/Plan: ?Thomas Frye is a 66 y.o. male with PMH of  severe obesity, GERD, HTN, OSA not on CPAP , major depressive disorder, GAD, pancreatic insufficiency, DM 1 with complications of retinopathy, neuropathy and PVD on Omni Pump , first and second toe amputations of right foot, left foot and then left BKA in 2022 with prosthesis, history of  CVA x2 last year (lacunar stroke) admitted for left stump osteomyelitis on doxycycline pending SNF placement.  ? ?Principal Problem: ?  Cellulitis of left lower extremity without foot ?Active Problems: ?  AKI (acute kidney injury) ([place]) ?  OSA (obstructive sleep apnea) ?  DM (diabetes mellitus), type 1 with peripheral vascular complications ([place]) ?  Pancreatic insufficiency ?  Infection of left below knee amputation ([place]) ?  Osteomyelitis of left tibia [facility]) ? ?#Osteomyelitis of left BKA stump  ?Patient is s/p treatment with IV vancomycin and Rocephin for Staph aureus wound infection and has been transitioned to oral antibiotics. He has remained afebrile and leukocytosis has resolved. Patient's surgeon, Dr Terrial Rhodes, plans to reevaluate patient in the outpatient setting for further amputation vs continuation of antibiotics. He is hemodynamically stable and  currently awaiting SNF placement.    ?--Continue doxycycline 100 mg bid and metronidazole 500 mg q12h ?--As needed Tylenol for pain ?--TOC consulted to help with SNF placement ?--Scheduled to follow-up with Dr. Terrial Rhodes, vascular surgery on 3/22 at 2:30 PM ?  ?#DM1 with hyperglycemia, with complications of retinopathy, neuropathy and PVD  ?Patient likely type 2 diabetes but unclear type I versus type II. Patient on insulin pump at home.  Diabetic coordinator has reviewed patient's insulin pump and made recommendations. Patient has been on Levemir 25 units nightly with CBGs ranging from 124-236 during hospitalization.  ?--Increase Levemir to 30 units nightly ?- Increase NovoLog to 10 units with meals ,  ?--Continue SSI-moderate with meals and nightly correction ?  ?#AKI, resolved. ?Creatinine of 1.70 on admission.  This was thought to be prerenal in the setting of recent infection.  Creatinine improved to 1.15 with fluid resuscitation. ?-- Daily BMP ?--Encourage p.o. hydration ?  ?#OSA ?Continue CPAP at night.  ?--He will need to follow-up outpatient for home CPAP.  ?  ?#Pancreatic insufficiency ?- Continue pancreatic enzymes with meals ?  ?#HTN ?Currently on home lisinopril 20mg  daily. SBP remains elevated 150-170s.  ?- Continue lisinopril 20mg  daily ?- Start amlodipine 10mg  daily  ?  ?#MDD ?#GAD ?- Continue home medications venlafaxine XR, quetiapine, and amitriptyline. ?  ?#GERD ?On omeprazole 40 mg twice daily at home. ?- Continue Protonix 40 mg daily ?  ? ?Diet: Carb modified ?IVF: None ?VTE: Lovenox ?CODE: Full ? ?Prior to Admission Living Arrangement: Home ?Anticipated Discharge Location: SNF ?Barriers to Discharge: Pending SNF placement ?Dispo: Anticipated discharge in approximately 1-2 day(s).  ? ?Signed: ?Harvie Heck, MD ?06/13/2021, 7:13 AM  ?Pager: [phone] ?Internal Medicine Teaching Service ?After 5pm on weekdays and 1pm on weekends: On Call pager: [phone] ? ?

## 2021-06-13 NOTE — Progress Notes (Signed)
Patient does not want to wear CPAP at this time. He says he will place on himself when ready. RT made sure CPAP was near him and plugged up. He will call if he needs anything from RT. ?

## 2021-06-13 NOTE — Progress Notes (Signed)
CPAP at bedside within reach of patient. Pt stated he will place himself on when ready without assistance. RT instructed pt to have RT called if needed. RT will monitor as needed. ?

## 2021-06-13 NOTE — Plan of Care (Signed)

## 2021-06-14 DIAGNOSIS — E1051 Type 1 diabetes mellitus with diabetic peripheral angiopathy without gangrene: Secondary | ICD-10-CM | POA: Diagnosis not present

## 2021-06-14 DIAGNOSIS — L03116 Cellulitis of left lower limb: Secondary | ICD-10-CM | POA: Diagnosis not present

## 2021-06-14 LAB — CBC
HCT: 36.4 % — ABNORMAL LOW (ref 39.0–52.0)
Hemoglobin: 12.4 g/dL — ABNORMAL LOW (ref 13.0–17.0)
MCH: 32.5 pg (ref 26.0–34.0)
MCHC: 34.1 g/dL (ref 30.0–36.0)
MCV: 95.3 fL (ref 80.0–100.0)
Platelets: 272 10*3/uL (ref 150–400)
RBC: 3.82 MIL/uL — ABNORMAL LOW (ref 4.22–5.81)
RDW: 12.6 % (ref 11.5–15.5)
WBC: 8.1 10*3/uL (ref 4.0–10.5)
nRBC: 0 % (ref 0.0–0.2)

## 2021-06-14 LAB — BASIC METABOLIC PANEL
Anion gap: 10 (ref 5–15)
BUN: 17 mg/dL (ref 8–23)
CO2: 28 mmol/L (ref 22–32)
Calcium: 9.7 mg/dL (ref 8.9–10.3)
Chloride: 96 mmol/L — ABNORMAL LOW (ref 98–111)
Creatinine, Ser: 1.14 mg/dL (ref 0.61–1.24)
GFR, Estimated: >60 mL/min (ref >60)
Glucose, Bld: 222 mg/dL — ABNORMAL HIGH (ref 70–99)
Potassium: 5 mmol/L (ref 3.5–5.1)
Sodium: 134 mmol/L — ABNORMAL LOW (ref 135–145)

## 2021-06-14 LAB — GLUCOSE, CAPILLARY
Glucose-Capillary: 206 mg/dL — ABNORMAL HIGH (ref 70–99)
Glucose-Capillary: 238 mg/dL — ABNORMAL HIGH (ref 70–99)
Glucose-Capillary: 278 mg/dL — ABNORMAL HIGH (ref 70–99)
Glucose-Capillary: 103 mg/dL — ABNORMAL HIGH (ref 70–99)

## 2021-06-14 MED ORDER — INSULIN DETEMIR 100 UNIT/ML ~~LOC~~ SOLN
35.0000 [IU] | Freq: Every day | SUBCUTANEOUS | Status: DC
  Administered 2021-06-14: 35 [IU] via SUBCUTANEOUS
  Filled 2021-06-14 (×2): qty 0.35

## 2021-06-14 MED ORDER — CEPHALEXIN 500 MG PO CAPS
500.0000 mg | ORAL_CAPSULE | Freq: Four times a day (QID) | ORAL | Status: DC
  Administered 2021-06-14 – 2021-06-17 (×13): 500 mg via ORAL
  Filled 2021-06-14 (×13): qty 1

## 2021-06-14 NOTE — Progress Notes (Signed)
? ?HD#0 ?SUBJECTIVE:  ?Patient Summary: Thomas Frye is a 66 y.o. 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  ? ?Overnight Events: None ? ?Interim History: Patient reports feeling overall well today but that earlier in the morning he felt that his stump was hot; at that time he did not notice fever or chills. Stump drainage persists but is decreased at this time. Pain is still there but overall not awful.  ? ?OBJECTIVE:  ?Vital Signs: ?Vitals:  ? 06/13/21 1517 06/13/21 2029 06/13/21 2042 06/14/21 0440  ?BP: (!) 146/71 126/73  (!) 156/89  ?Pulse: 89 93 89 80  ?Resp: 17 18 16 17   ?Temp: 98.1 ?F (36.7 ?C) 98.3 ?F (36.8 ?C)  97.9 ?F (36.6 ?C)  ?TempSrc:  Oral  Oral  ?SpO2: 91% 97% 96% 96%  ?Weight:    (!) 136.2 kg  ?Height:      ? ?Supplemental O2: Room Air ?SpO2: 96 % ?O2 Flow Rate (L/min): 3 L/min ? ?Filed Weights  ? 06/12/21 0500 06/13/21 0500 06/14/21 0440  ?Weight: 134.3 kg (!) 136.4 kg (!) 136.2 kg  ? ? ?Intake/Output Summary (Last 24 hours) at 06/14/2021 0703 ?Last data filed at 06/13/2021 2200 ?Gross per 24 hour  ?Intake 240 ml  ?Output 1800 ml  ?Net -1560 ml  ? ?Net IO Since Admission: -2,646.39 mL [06/14/21 0703] ? ?Physical Exam: ?Constitutional:Pleasant gentleman resting comfortably in bed. No acute distress noted. ?Cardio:Regular rate and rhythm. No murmurs, rubs, gallops. ?Pulm:Clear to auscultation bilaterally. Normal work of breathing on room air. ?[id number] for RLE edema. LLE with minimally-draining wound, mildly increased warmth compared to RLE, but without increased edema or erythema. ?Skin:Mildly diaphoretic. ?Neuro:Alert and oriented x3. No focal  deficit noted. ?Psych:Normal mood and affect. ? ?Patient Lines/Drains/Airways Status   ? ? Active Line/Drains/Airways   ? ? Name Placement date Placement time Site Days  ? Peripheral IV 06/10/21 20 G Right Hand 06/10/21  0558  Hand  4  ? 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 08/18/12  1404  -- 3222  ? Wound / Incision (Open or Dehisced) 06/10/21 Diabetic ulcer Leg Left apx 0.5cm diameter, circular, small serorsang drainage 06/10/21  0819  Leg  4  ? ?  ?  ? ?  ? ? ? ?ASSESSMENT/PLAN:  ?Assessment: ?Principal Problem: ?  Cellulitis of left lower extremity without foot ?Active Problems: ?  AKI (acute kidney injury) ([place]) ?  OSA (obstructive sleep apnea) ?  DM (diabetes mellitus), type 1 with peripheral vascular complications ([place]) ?  Pancreatic insufficiency ?  Infection of left below knee amputation ([place]) ?  Osteomyelitis of left tibia [facility]) ? ? ?Plan: ?Thomas Frye is a 66 y.o. 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  ? ?#Osteomyelitis of  left BKA stump  ?Patient has been managed to this point with metronidazole 500 mg TID and doxycycline 100 mg BID. He remains afebrile without leukocytosis. He is awaiting SNF placement at this time. Follow-up with Dr. Terrial Rhodes with vascular surgery is scheduled for 03/22 at 2:30 PM. ?-Stop doxycycline and metronidazole ?-Start Keflex 500 mg q6h for 10 days ?-Tylenol PRN ?  ?#DM1 with hyperglycemia, with complications of retinopathy, neuropathy and PVD  ?CBG have been quite varied but mostly >230 over the last day. Levemir increased to 30 and  NovoLog to 10 units 03/19 and morning CBG much more appropriate, however still largely exceeding 200. ?-Increase Levemir to 35 units nightly ?-Continue NovoLog to 10 units with meals ?-Continue SSI-moderate with meals and nightly correction ?  ?#AKI, resolved. ?-Daily BMP ?-Encourage PO hydration ?  ?#OSA ?Continue CPAP at night.  ?-He will need to follow-up outpatient for home CPAP.  ?  ?#Pancreatic insufficiency ?-Continue pancreatic enzymes with meals ?  ?#HTN ?Amlodipine 10 mg started 03/19, SBP have trended more towards appropriate over last 24 hours. ?-Continue lisinopril 20mg  daily ?-Continue amlodipine 10mg  daily  ?  ?#MDD ?#GAD ?-Continue home medications venlafaxine XR, quetiapine, and amitriptyline. ?  ?#GERD ?On omeprazole 40 mg twice daily at home. ?-Continue Protonix 40 mg daily ? ?Best Practice: ?Diet: Diabetic diet ?IVF: None ?VTE: enoxaparin (LOVENOX) injection 40 mg Start: 06/10/21 1200 ?Code: Full ?AB: Keflex 500 mg q6h ?Therapy Recs: SNF ?DISPO: Anticipated discharge in 1-2 days to [facility] pending Insurance for SNF coverage. ? ?Signature: ?Farrel Gordon, D.O.  ?Internal Medicine Resident, PGY-1 ?[facility]  ?Pager: [phone] ?7:03 AM, 06/14/2021  ? ?Please contact the on call pager after 5 pm and on weekends at [phone]. ? ?

## 2021-06-14 NOTE — Progress Notes (Signed)
Inpatient Diabetes Program Recommendations ? ?AACE/ADA: New Consensus Statement on Inpatient Glycemic Control (2015) ? ?Target Ranges:  Prepandial:   less than 140 mg/dL ?     Peak postprandial:   less than 180 mg/dL (1-2 hours) ?     Critically ill patients:  140 - 180 mg/dL  ? ?Lab Results  ?Component Value Date  ? GLUCAP 206 (H) 06/14/2021  ? HGBA1C 8.0 (H) 06/11/2021  ? ? ?Review of Glycemic Control ? Latest Reference Range & Units 06/13/21 06:28 06/13/21 07:53 06/13/21 11:43 06/13/21 16:52 06/13/21 20:38 06/14/21 07:52  ?Glucose-Capillary 70 - 99 mg/dL 856 (H) 314 (H) 970 (H) 237 (H) 110 (H) 206 (H)  ? ?Diabetes history: DM type 2 ?Outpatient Diabetes medications: Omnipod insulin pump with Novolin R insulin (uses pump for correction and meal coverage only), Freestyle libre 2 CGM, Tresiba 54 units ?Insulin pump settings: ?No basal rates ?Sensitivity 1 unit of insulin drops glucose 20 points ?Carb coverage 1 unit of insulin for every 4 grams of carbs ?Target glucose 100-120 ?Current orders for Inpatient glycemic control:  ?Levemir 30 units qhs ?Novolog 0-15 units tid + hs ?Novolog 10 units tid meal coverage tid ? ?-   Increase Levemir to 35 units.  ? ?Thanks, ? ?Christena Deem RN, MSN, BC-ADM ?Inpatient Diabetes Coordinator ?Team Pager [phone] (8a-5p) ? ? ?

## 2021-06-14 NOTE — Progress Notes (Signed)
Patient has CPAP set up at bedside and places himself on without assistance. RT will monitor as needed. ?

## 2021-06-14 NOTE — TOC Progression Note (Signed)
Transition of Care (TOC) - Progression Note  ? ? ?Patient Details  ?Name: Thomas Frye ?MRN: [id number] ?Date of Birth: December 25, 1955 ? ?Transition of Care (TOC) CM/SW Contact  ?Emeterio Reeve, LCSW ?Phone Number: ?06/14/2021, 4:47 PM ? ?Clinical Narrative:    ? ?Pt has bed offers. CSW faxed further out for SNF's.  ? ?Expected Discharge Plan: [place] ?Barriers to Discharge: SNF Pending bed offer ? ?Expected Discharge Plan and Services ?Expected Discharge Plan: [place] ?In-house Referral: Clinical Social Work ?  ?Post Acute Care Choice: [place] ?Living arrangements for the past 2 months: [place] ?Expected Discharge Date: 06/11/21               ?  ?  ?  ?  ?  ?  ?   ?  ?  ? ? ?Social Determinants of Health (SDOH) Interventions ?  ? ?Readmission Risk Interventions ?No flowsheet data found. ? ?

## 2021-06-15 LAB — CULTURE, BLOOD (ROUTINE X 2)
Culture: NO GROWTH
Special Requests: ADEQUATE

## 2021-06-15 LAB — BASIC METABOLIC PANEL
Anion gap: 8 (ref 5–15)
BUN: 22 mg/dL (ref 8–23)
CO2: 27 mmol/L (ref 22–32)
Calcium: 9.1 mg/dL (ref 8.9–10.3)
Chloride: 97 mmol/L — ABNORMAL LOW (ref 98–111)
Creatinine, Ser: 1.04 mg/dL (ref 0.61–1.24)
GFR, Estimated: >60 mL/min (ref >60)
Glucose, Bld: 293 mg/dL — ABNORMAL HIGH (ref 70–99)
Potassium: 4.4 mmol/L (ref 3.5–5.1)
Sodium: 132 mmol/L — ABNORMAL LOW (ref 135–145)

## 2021-06-15 LAB — GLUCOSE, CAPILLARY
Glucose-Capillary: 324 mg/dL — ABNORMAL HIGH (ref 70–99)
Glucose-Capillary: 256 mg/dL — ABNORMAL HIGH (ref 70–99)
Glucose-Capillary: 170 mg/dL — ABNORMAL HIGH (ref 70–99)
Glucose-Capillary: 151 mg/dL — ABNORMAL HIGH (ref 70–99)

## 2021-06-15 LAB — CBC
HCT: 33.6 % — ABNORMAL LOW (ref 39.0–52.0)
Hemoglobin: 11.4 g/dL — ABNORMAL LOW (ref 13.0–17.0)
MCH: 32.1 pg (ref 26.0–34.0)
MCHC: 33.9 g/dL (ref 30.0–36.0)
MCV: 94.6 fL (ref 80.0–100.0)
Platelets: 246 10*3/uL (ref 150–400)
RBC: 3.55 MIL/uL — ABNORMAL LOW (ref 4.22–5.81)
RDW: 12.6 % (ref 11.5–15.5)
WBC: 6.9 10*3/uL (ref 4.0–10.5)
nRBC: 0 % (ref 0.0–0.2)

## 2021-06-15 MED ORDER — INSULIN GLARGINE-YFGN 100 UNIT/ML ~~LOC~~ SOLN
40.0000 [IU] | Freq: Every day | SUBCUTANEOUS | Status: DC
  Administered 2021-06-15: 40 [IU] via SUBCUTANEOUS
  Filled 2021-06-15 (×3): qty 0.4

## 2021-06-15 MED ORDER — INSULIN ASPART 100 UNIT/ML IJ SOLN
12.0000 [IU] | Freq: Three times a day (TID) | INTRAMUSCULAR | Status: DC
Start: 2021-06-15 — End: 2021-06-17
  Administered 2021-06-15 – 2021-06-17 (×8): 12 [IU] via SUBCUTANEOUS

## 2021-06-15 MED ORDER — INSULIN DETEMIR 100 UNIT/ML ~~LOC~~ SOLN
40.0000 [IU] | Freq: Every day | SUBCUTANEOUS | Status: DC
  Filled 2021-06-15: qty 0.4

## 2021-06-15 NOTE — Progress Notes (Signed)
Pt refused to wear CPAP tonight. ?

## 2021-06-15 NOTE — TOC Progression Note (Signed)
Transition of Care (TOC) - Progression Note  ? ? ?Patient Details  ?Name: Thomas Frye ?MRN: [id number] ?Date of Birth: [birth year] ? ?Transition of Care (TOC) CM/SW Contact  ?Emeterio Reeve, LCSW ?Phone Number: ?06/15/2021, 4:56 PM ? ?Clinical Narrative:    ? ?CSW met with pt and reviewed bed offers. Pt chose [facility] for SNF. CSW confirmed that [facility] can accept pt at discharge. [place] will start insurance auth.  ? ?Expected Discharge Plan: [place] ?Barriers to Discharge: SNF Pending bed offer ? ?Expected Discharge Plan and Services ?Expected Discharge Plan: [place] ?In-house Referral: Clinical Social Work ?  ?Post Acute Care Choice: [place] ?Living arrangements for the past 2 months: [place] ?Expected Discharge Date: 06/11/21               ?  ?  ?  ?  ?  ?  ?  ?  ?  ?  ? ? ?Social Determinants of Health (SDOH) Interventions ?  ? ?Readmission Risk Interventions ?No flowsheet data found. ? ?Emeterio Reeve, LCSW ?Clinical Social Worker ? ?

## 2021-06-15 NOTE — Plan of Care (Incomplete)
?  Problem: Health Behavior/Discharge Planning: ?Goal: Ability to manage health-related needs will improve ?Outcome: Progressing ?  ?Problem: Activity: ?Goal: Risk for activity intolerance will decrease ?Outcome: Progressing ?  ?Problem: Elimination: ?Goal: Will not experience complications related to bowel motility ?Outcome: Progressing ?  ?Problem: Clinical Measurements: ?Goal: Ability to maintain clinical measurements within normal limits will improve ?Outcome: Progressing ?  ?

## 2021-06-15 NOTE — Progress Notes (Signed)
CPAP at bedside. Patient stated he will place on himself when he is ready. RT will monitor as needed. ?

## 2021-06-15 NOTE — Progress Notes (Signed)
Physical Therapy Treatment ?Patient Details ?Name: Thomas Frye ?MRN: [id number] ?DOB: 1955/09/24 ?Today's Date: 06/15/2021 ? ? ?History of Present Illness Pt is a 66 y/o male presenting on 3/16 with L BKA wound, recent fall out of bed.  MRI with cellulitis, osteomyelitis involving the tibia. 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 ? ?  ?PT Comments  ? ? Pt received in supine, agreeable to therapy session and with excellent participation and good tolerance for reciprocal transfer training and supine/seated/standing LE exercises. Pt performs sit<>stand transfers with up to modA from elevated bed height<>RW and up to minA for seated lateral scooting to simulate slide board transfers. Pt able to tolerate standing longer today than in previous session, up to 3 minutes at a time while performing LLE exercises. Pt seen shortly after working with OT but still with good activity tolerance and eager to progress strength/endurance. Pt continues to benefit from PT services to progress toward functional mobility goals.    ?Recommendations for follow up therapy are one component of a multi-disciplinary discharge planning process, led by the attending physician.  Recommendations may be updated based on patient status, additional functional criteria and insurance authorization. ? ?Follow Up Recommendations ? Skilled nursing-short term rehab (<3 hours/day) ?  ?  ?Assistance Recommended at Discharge Frequent or constant Supervision/Assistance  ?Patient can return home with the following A lot of help with walking and/or transfers;A lot of help with bathing/dressing/bathroom;Assistance with cooking/housework;Assist for transportation;Help with stairs or ramp for entrance ?  ?Equipment Recommendations ? Other (comment) (drop arm BSC, slide board)  ?  ?Recommendations for Other  Services   ? ? ?  ?Precautions / Restrictions Precautions ?Precautions: Fall ?Precaution Comments: L BKA with cellulitis and wound to distal end of residual limb ?Restrictions ?Weight Bearing Restrictions: No ?Other Position/Activity Restrictions: No orders in, assume protective NWB to L residual limb due to wound, not safe to use prosthetic at this time  ?  ? ?Mobility ? Bed Mobility ?Overal bed mobility: Needs Assistance ?Bed Mobility: Supine to Sit ?  ?  ?Supine to sit: Supervision ?  ?  ?General bed mobility comments: HOB elevated ?  ? ?Transfers ?Overall transfer level: Needs assistance ?Equipment used: Rolling walker (2 wheels) ?Transfers: Sit to/from Stand ?Sit to Stand: Mod assist, From elevated surface ?  ?  ?  ?  ? Lateral/Scoot Transfers: Min assist ?General transfer comment: minA for scooting toward L side x5 reps, STS x 4 trials from elevated bed height then slightly lower elevated bed with consistent modA and pt needs momentum and assist for upright and anterior weight shift and to stabilize RW; able to stand 2-3 minutes at most. ?  ? ?Ambulation/Gait ?Ambulation/Gait assistance: Mod assist ?  ?Assistive device: Rolling walker (2 wheels) ?  ?  ?  ?Pre-gait activities: hopping toward his L x3 reps, backward x2 reps at bedside; recently transfer from chair back to bed so defer OOB transfer; unsafe to assess further due to pt instability needing modA +1 will need chair follow for further gait progression safely ?  ? ? ?Stairs ?  ?  ?  ?  ?  ? ? ?Wheelchair Mobility ?  ? ?Modified Rankin (Stroke Patients Only) ?  ? ? ?  ?Balance Overall balance assessment: Needs assistance ?Sitting-balance support: No upper extremity supported, Feet supported ?Sitting balance-Leahy Scale: Good ?Sitting balance - Comments: able to reach down  to doff shoe, slightly impulsive Supervision for safety ?  ?Standing balance support: Bilateral upper extremity supported, Reliant on assistive device for balance ?Standing  balance-Leahy Scale: Poor ?Standing balance comment: consistent modA due to L and posterior lean, able to stand and perform a few therex, improved standing tolerance from previous Saturday. ?  ?  ?  ?  ?  ?  ?  ?  ?  ?  ?  ?  ? ?  ?Cognition Arousal/Alertness: Awake/alert ?Behavior During [facility]: [facility] for tasks assessed/performed ?Overall Cognitive Status: Within Functional Limits for tasks assessed ?  ?  ?  ?  ?  ?  ?  ?  ?  ?  ?  ?  ?  ?  ?  ?Problem Solving: Slow processing ?General Comments: slow processing, cues needed for safe UE placement frequently; pt at times unable to indicate when he is fatigued until he is in process of sitting ?  ?  ? ?  ?Exercises Other Exercises ?Other Exercises: standing LLE AROM: hip abduction, hip flexion, hip extension x10 reps ea direction (at RW and min/modA for balance) ?Other Exercises: seated BLE AROM: LAQ x10 reps ?Other Exercises: STS x3 reciprocal ?Other Exercises: reviewed supine/sidelying LE therex he can perform ?Other Exercises: supine single leg bridges x7 reps (emphasis on core activation and breathing) ? ?  ?General Comments General comments (skin integrity, edema, etc.): VSS on RA ?  ?  ? ?Pertinent Vitals/Pain Pain Assessment ?Pain Assessment: No/denies pain  ? ? ?Home Living   ?  ?  ?  ?  ?  ?  ?  ?  ?  ?   ?  ?Prior Function    ?  ?  ?   ? ?PT Goals (current goals can now be found in the care plan section) Acute Rehab PT Goals ?Patient Stated Goal: to get stronger so I can golf again with my sons and father ?PT Goal Formulation: With patient ?Time For Goal Achievement: 06/25/21 ?Progress towards PT goals: Progressing toward goals ? ?  ?Frequency ? ? ? Min 3X/week ? ? ? ?  ?PT Plan Current plan remains appropriate  ? ? ?Co-evaluation   ?  ?  ?  ?  ? ?  ?AM-PAC PT "6 Clicks" Mobility   ?Outcome Measure ? Help needed turning from your back to your side while in a flat bed without using bedrails?: A Little ?Help needed moving from lying on your back to sitting on  the side of a flat bed without using bedrails?: A Little ?Help needed moving to and from a bed to a chair (including a wheelchair)?: A Lot ?Help needed standing up from a chair using your arms (e.g., wheelchair or bedside chair)?: A Lot ?Help needed to walk in hospital room?: A Lot ?Help needed climbing 3-5 steps with a railing? : Total ?6 Click Score: 13 ? ?  ?End of Session Equipment Utilized During Treatment: Gait belt ?Activity Tolerance: Patient tolerated treatment well ?Patient left: in bed;with call bell/phone within reach;with bed alarm set;Other (comment) ?Nurse Communication: Mobility status;Other (comment) (pt asking about contacting vascular MD office about his appt in [place] tmrw) ?PT Visit Diagnosis: Other abnormalities of gait and mobility (R26.89);Difficulty in walking, not elsewhere classified (R26.2) ?  ? ? ?Time: [id number] ?PT Time Calculation (min) (ACUTE ONLY): 23 min ? ?Charges:  $Therapeutic Exercise: 8-22 mins ?$Therapeutic Activity: 8-22 mins          ?          ? ?  Jaella Weinert P., PTA ?Acute Rehabilitation Services ?Pager: [phone] ?Office: [phone]  ? ? ?Dorathy Kinsman Nikiah Goin ?06/15/2021, 3:36 PM ? ?

## 2021-06-15 NOTE — Progress Notes (Addendum)
? ?HD#0 ?SUBJECTIVE:  ?Patient Summary: Thomas Frye is a 66 y.o. 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  ? ?Overnight Events: Patient refused CPAP overnight. ? ?Interim History: Patient states that he is doing okay this morning, just sitting and waiting on further updates regarding where he is going next. He says that his leg doesn't feel hot anymore and he isn't having any pain. No longer having drainage from the wound. ? ?OBJECTIVE:  ?Vital Signs: ?Vitals:  ? 06/14/21 1932 06/14/21 2020 06/15/21 0448 06/15/21 0730  ?BP:  (!) 145/80 131/72 (!) 172/87  ?Pulse: 85 87 82 84  ?Resp: 16 17 17 18   ?Temp:  98 ?F (36.7 ?C) 98.2 ?F (36.8 ?C) 97.9 ?F (36.6 ?C)  ?TempSrc:  Oral Oral   ?SpO2: 99% 95% 95% 95%  ?Weight:   134.7 kg   ?Height:      ? ?Supplemental O2: Room Air ?SpO2: 95 % ?O2 Flow Rate (L/min): 3 L/min ? ?Filed Weights  ? 06/13/21 0500 06/14/21 0440 06/15/21 0448  ?Weight: (!) 136.4 kg (!) 136.2 kg 134.7 kg  ? ? ? ?Intake/Output Summary (Last 24 hours) at 06/15/2021 0735 ?Last data filed at 06/15/2021 0600 ?Gross per 24 hour  ?Intake 730 ml  ?Output 2100 ml  ?Net -1370 ml  ? ?Net IO Since Admission: -4,016.39 mL [06/15/21 0735] ? ?Physical Exam: ?Constitutional:Sitting in bed, no acute distress noted. ?Cardio:Regular rate and rhythm. No murmurs, rubs, or gallops. ?Pulm:Normal work of breathing on room air. ?Abdomen:Soft, nontender, nondistended. ?MSK:L BKA site without increased warmth, erythema, or edema. No longer draining at wound site.  ?Skin:Warm and dry.  ?Neuro:Alert and oriented x3. No focal deficit noted. ?Psych:Normal mood and affect. ? ?Patient  Lines/Drains/Airways Status   ? ? Active Line/Drains/Airways   ? ? Name Placement date Placement time Site Days  ? Peripheral IV 06/10/21 20 G Right Hand 06/10/21  0558  Hand  5  ? 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 08/18/12  1404  -- 3223  ? Wound / Incision (Open or Dehisced) 06/10/21 Diabetic ulcer Leg Left apx 0.5cm diameter, circular, small serorsang drainage 06/10/21  0819  Leg  5  ? ?  ?  ? ?  ? ? ? ?ASSESSMENT/PLAN:  ?Assessment: ?Principal Problem: ?  Cellulitis of left lower extremity without foot ?Active Problems: ?  AKI (acute kidney injury) ([place]) ?  OSA (obstructive sleep apnea) ?  DM (diabetes mellitus), type 1 with peripheral vascular complications ([place]) ?  Pancreatic insufficiency ?  Infection of left below knee amputation ([place]) ?  Osteomyelitis of left tibia [facility]) ? ? ?Plan: ?Thomas Frye is a 66 y.o. 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  ?  ?#Osteomyelitis of left BKA stump  ?  Patient was transitioned to Keflex 500 mg q6h 03/21. Clinically the area surrounding his wound appears improved with decreased erythema, edema, warmth, and no longer draining. He is afebrile without leukocytosis, hemodynamically stable. Currently scheduled for follow-up with Dr. Terrial Rhodes with vascular surgery tomorrow, 03/22 at 2:30 PM, however if he is still hospitalized this will need to be rescheduled.  ?-Continue Keflex 500 mg q6h for 9 days ?-Tylenol PRN pain ?  ?#DM with hyperglycemia, with complications of retinopathy, neuropathy and PVD  ?CBG continue to be quite elevated but  improving slowly. Levemir was increased to 35 units at bedtime on 03/21. On chart review, it appears that he received 48 units total with meals, with 18 of those over base dose of 10 units with meals. ?-Change regimen to semglee 40 units daily at bedtime, novoLOG 12 units three times daily with meals, plus SSI ?  ?#AKI, resolved. ?-Daily BMP ?-Encourage PO hydration ?  ?#OSA ?Continue CPAP at night.  ?-He will need to follow-up outpatient for home CPAP.  ?  ?#Pancreatic insufficiency ?-Continue pancreatic enzymes with meals ?  ?#HTN ?Amlodipine 10 mg started 03/19, BP appropriate. ?-Continue lisinopril 20mg  daily ?-Continue amlodipine 10mg  daily  ?  ?#MDD ?#GAD ?-Continue home medications venlafaxine XR, quetiapine, and amitriptyline. ?  ?#GERD ?On omeprazole 40 mg twice daily at home. ?-Continue Protonix 40 mg daily ? ?Best Practice: ?Diet: Diabetic diet ?IVF: None ?VTE: enoxaparin (LOVENOX) injection 40 mg Start: 06/10/21 1200 ?Code: Full ?AB: Keflex 500 mg q6h ?Therapy Recs: SNF ?DISPO: Anticipated discharge in 1-2 days to [place] pending Insurance for SNF coverage. ? ?Signature: ?Farrel Gordon, D.O.  ?Internal Medicine Resident, PGY-1 ?[facility]  ?Pager: [phone] ?7:35 AM, 06/15/2021  ? ?Please contact the on call pager after 5 pm and on weekends at [phone].  ?

## 2021-06-15 NOTE — Progress Notes (Signed)
Occupational Therapy Treatment ?Patient Details ?Name: Thomas Frye ?MRN: [id number] ?DOB: 01-Jun-1955 ?Today's Date: 06/15/2021 ? ? ?History of present illness Pt is a 66 y/o male presenting on 3/16 with L BKA wound, recent fall out of bed.  MRI with cellulitis, osteomyelitis involving the tibia. 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 ?  ?OT comments ? Pt progressing towards goals this session, requiring mod A for simulated toilet transfer (stand pivot). Attempted x3, successful on 3rd attempt with increased cuing for hand placement. Pt with L lateral lean in standing. Pt able to doff shoe supervision once in bed, pt anxious/preoccupied during session. Concerned with being able to make it to a dr.'s appt he has tomorrow. Pt presenting with impairments listed below, will follow acutely. Noted CIR not pursuing this pt, updating d/c recommendations to SNF.  ? ?Recommendations for follow up therapy are one component of a multi-disciplinary discharge planning process, led by the attending physician.  Recommendations may be updated based on patient status, additional functional criteria and insurance authorization. ?   ?Follow Up Recommendations ? Skilled nursing-short term rehab (<3 hours/day)  ?  ?Assistance Recommended at Discharge Intermittent Supervision/Assistance  ?Patient can return home with the following ? A little help with walking and/or transfers;A little help with bathing/dressing/bathroom;Assistance with cooking/housework;Help with stairs or ramp for entrance;Assist for transportation ?  ?Equipment Recommendations ? BSC/3in1 (drop arm)  ?  ?Recommendations for Other Services   ? ?  ?Precautions / Restrictions Precautions ?Precautions: Fall ?Precaution Comments: L BKA with cellulitis and wound to distal end of residual limb ?Restrictions ?Weight Bearing  Restrictions: No ?Other Position/Activity Restrictions: No orders in, assume protective NWB to L residual limb due to wound, not safe to use prosthetic at this time  ? ? ?  ? ?Mobility Bed Mobility ?Overal bed mobility: Needs Assistance ?Bed Mobility: Sit to Supine ?  ?  ?  ?Sit to supine: Supervision ?  ?General bed mobility comments: HOB elevated ?  ? ?Transfers ?Overall transfer level: Needs assistance ?Equipment used: Rolling walker (2 wheels) ?Transfers: Sit to/from Stand, Bed to chair/wheelchair/BSC ?Sit to Stand: Mod assist ?  ?  ?  ?  ? Lateral/Scoot Transfers: Mod assist ?General transfer comment: attempted x3, successful with standing on 3rd attempt, pt unsteady with increased cuing needed to reach for RW ?  ?  ?Balance Overall balance assessment: Needs assistance ?Sitting-balance support: No upper extremity supported, Feet supported ?Sitting balance-Leahy Scale: Good ?Sitting balance - Comments: able to reach down to doff shoe ?  ?Standing balance support: Bilateral upper extremity supported, Reliant on assistive device for balance ?Standing balance-Leahy Scale: Zero ?Standing balance comment: needing increased cuing for hand placement, pt with L lateral lean, sitting quickly backward onto chair x2 ?  ?  ?  ?  ?  ?  ?  ?  ?  ?  ?  ?   ? ?ADL either performed or assessed with clinical judgement  ? ?ADL Overall ADL's : Needs assistance/impaired ?  ?  ?  ?  ?  ?  ?  ?  ?  ?  ?Lower Body Dressing: Supervision/safety;Sitting/lateral leans ?Lower Body Dressing Details (indicate cue type and reason): doffs shoe ?Toilet Transfer: Moderate assistance;Minimal assistance;Rolling walker (2 wheels);Stand-pivot;Cueing for safety;Cueing for sequencing ?Toilet Transfer Details (indicate cue type and reason): stand pivot from recliner > bed ?  ?  ?  ?  ?  Functional mobility during ADLs: Minimal assistance;Moderate assistance ?  ?  ? ?Extremity/Trunk Assessment Upper Extremity Assessment ?Upper Extremity Assessment: Overall  WFL for tasks assessed ?  ?Lower Extremity Assessment ?Lower Extremity Assessment: Defer to PT evaluation ?  ?  ?  ? ?Vision   ?Vision Assessment?: No apparent visual deficits ?  ?Perception Perception ?Perception: Not tested ?  ?Praxis Praxis ?Praxis: Not tested ?  ? ?Cognition Arousal/Alertness: Awake/alert ?Behavior During Therapy: [facility] for tasks assessed/performed ?Overall Cognitive Status: Within Functional Limits for tasks assessed ?  ?  ?  ?  ?  ?  ?  ?  ?  ?  ?  ?  ?  ?  ?  ?Problem Solving: Slow processing ?General Comments: preoccupied/ anxious regarding ability to get to follow up appointment later this week ?  ?  ?   ?Exercises   ? ?  ?Shoulder Instructions   ? ? ?  ?General Comments VSS on RA  ? ? ?Pertinent Vitals/ Pain       Pain Assessment ?Pain Assessment: No/denies pain ? ?Home Living   ?  ?  ?  ?  ?  ?  ?  ?  ?  ?  ?  ?  ?  ?  ?  ?  ?  ?  ? ?  ?Prior Functioning/Environment    ?  ?  ?  ?   ? ?Frequency ? Min 2X/week  ? ? ? ? ?  ?Progress Toward Goals ? ?OT Goals(current goals can now be found in the care plan section) ? Progress towards OT goals: Progressing toward goals ? ?Acute Rehab OT Goals ?Patient Stated Goal: to get to his dr. appt tomorrow ?OT Goal Formulation: With patient ?Time For Goal Achievement: 06/25/21 ?Potential to Achieve Goals: Good ?ADL Goals ?Pt Will Perform Grooming: with set-up;sitting ?Pt Will Perform Lower Body Dressing: with supervision;sitting/lateral leans;sit to/from stand ?Pt Will Transfer to Toilet: with supervision;bedside commode;squat pivot transfer ?Pt Will Perform Toileting - Clothing Manipulation and hygiene: with supervision;sit to/from stand;sitting/lateral leans ?Pt/caregiver will Perform Home Exercise Program: Increased strength;Both right and left upper extremity;With written HEP provided;With theraband  ?Plan Frequency remains appropriate;Discharge plan needs to be updated   ? ?Co-evaluation ? ? ?   ?  ?  ?  ?  ? ?  ?AM-PAC OT "6 Clicks" Daily Activity      ?Outcome Measure ? ? Help from another person eating meals?: A Little ?Help from another person taking care of personal grooming?: A Little ?Help from another person toileting, which includes using toliet, bedpan, or urinal?: A Lot ?Help from another person bathing (including washing, rinsing, drying)?: A Lot ?Help from another person to put on and taking off regular upper body clothing?: A Little ?Help from another person to put on and taking off regular lower body clothing?: A Lot ?6 Click Score: 15 ? ?  ?End of Session Equipment Utilized During Treatment: Rolling walker (2 wheels);Gait belt ? ?OT Visit Diagnosis: Other abnormalities of gait and mobility (R26.89) ?  ?Activity Tolerance Patient tolerated treatment well ?  ?Patient Left in bed;with call bell/phone within reach;with bed alarm set ?  ?Nurse Communication Mobility status;Precautions ?  ? ?   ? ?Time: 1336-1401 ?OT Time Calculation (min): 25 min ? ?Charges: OT General Charges ?$OT Visit: 1 Visit ?OT Treatments ?$Self Care/Home Management : 8-22 mins ?$Therapeutic Activity: 8-22 mins ? ?Alfonzo Beers, OTD, OTR/L ?Acute Rehab ?[phone] ? ? ?Mayer Masker ?06/15/2021, 2:49 PM ?

## 2021-06-15 NOTE — Progress Notes (Signed)
Inpatient Diabetes Program Recommendations ? ?AACE/ADA: New Consensus Statement on Inpatient Glycemic Control (2015) ? ?Target Ranges:  Prepandial:   less than 140 mg/dL ?     Peak postprandial:   less than 180 mg/dL (1-2 hours) ?     Critically ill patients:  140 - 180 mg/dL  ? ?Lab Results  ?Component Value Date  ? GLUCAP 324 (H) 06/15/2021  ? HGBA1C 8.0 (H) 06/11/2021  ? ? ?Review of Glycemic Control ? Latest Reference Range & Units 06/14/21 07:52 06/14/21 12:17 06/14/21 16:12 06/14/21 20:32 06/15/21 07:27  ?Glucose-Capillary 70 - 99 mg/dL 564 (H) 332 (H) 951 (H) 103 (H) 324 (H)  ?(H): Data is abnormally high ? ?Diabetes history: DM type 2 ?Outpatient Diabetes medications: Omnipod insulin pump with Novolin R insulin (uses pump for correction and meal coverage only), Freestyle libre 2 CGM, Tresiba 54 units ?Insulin pump settings: ?No basal rates ?Sensitivity 1 unit of insulin drops glucose 20 points ?Carb coverage 1 unit of insulin for every 4 grams of carbs ?Target glucose 100-120 ?Current orders for Inpatient glycemic control:  ?Levemir 30 units qhs ?Novolog 0-15 units tid + hs ?Novolog 10 units tid meal coverage tid ? ?Current Diabetes Medications: Levemir 40 units QHS, (increased from 35 tonight), Novolog 0-15 units TID and 0-5 QHS ? ?Inpatient Diabetes Program Recommendations:   ? ?Please consider changing basal insulin to [facility] as its effective duration is 24 hrs.  Recommend increasing to basal to 45 units QHS.  Levemir has an effective duration of 6-24 hrs.   He uses [place] at home which has an effective duration of <42 hrs.   ? ?Will continue to follow while inpatient. ? ?Thank you, ?Dulce Sellar, MSN, RN ?Diabetes Coordinator ?Inpatient Diabetes Program ?[phone] (team pager from 8a-5p) ? ? ? ? ?

## 2021-06-15 NOTE — Progress Notes (Signed)
Mobility Specialist Progress Note: ? ? 06/15/21 1204  ?Mobility  ?Activity Transferred from bed to chair;Ambulated with assistance in room;Stood at bedside  ?Level of Assistance Moderate assist, patient does 50-74%  ?Assistive Device Front wheel walker  ?Distance Ambulated (ft) 2 ft  ?Activity Response Tolerated well  ?$Mobility charge 1 Mobility  ? ?Pt received in bed willing to participate in mobility. No complaints of pain. Pt able to stand MinA from elevated bed but could not tolerate standing so sat back down. Second stand required modA to stand and to step to recliner. Left in chair with call bell in reach and all needs met.  ? ?Thomas Frye ?Mobility Specialist ?Primary Phone [phone] ? ?

## 2021-06-16 DIAGNOSIS — L03116 Cellulitis of left lower limb: Secondary | ICD-10-CM | POA: Diagnosis not present

## 2021-06-16 DIAGNOSIS — E1169 Type 2 diabetes mellitus with other specified complication: Secondary | ICD-10-CM

## 2021-06-16 DIAGNOSIS — Z794 Long term (current) use of insulin: Secondary | ICD-10-CM

## 2021-06-16 DIAGNOSIS — M86462 Chronic osteomyelitis with draining sinus, left tibia and fibula: Secondary | ICD-10-CM | POA: Diagnosis not present

## 2021-06-16 DIAGNOSIS — E1165 Type 2 diabetes mellitus with hyperglycemia: Secondary | ICD-10-CM

## 2021-06-16 LAB — GLUCOSE, CAPILLARY
Glucose-Capillary: 331 mg/dL — ABNORMAL HIGH (ref 70–99)
Glucose-Capillary: 224 mg/dL — ABNORMAL HIGH (ref 70–99)
Glucose-Capillary: 198 mg/dL — ABNORMAL HIGH (ref 70–99)
Glucose-Capillary: 66 mg/dL — ABNORMAL LOW (ref 70–99)
Glucose-Capillary: 101 mg/dL — ABNORMAL HIGH (ref 70–99)

## 2021-06-16 LAB — CBC
HCT: 34.8 % — ABNORMAL LOW (ref 39.0–52.0)
Hemoglobin: 11.3 g/dL — ABNORMAL LOW (ref 13.0–17.0)
MCH: 31.1 pg (ref 26.0–34.0)
MCHC: 32.5 g/dL (ref 30.0–36.0)
MCV: 95.9 fL (ref 80.0–100.0)
Platelets: 270 10*3/uL (ref 150–400)
RBC: 3.63 MIL/uL — ABNORMAL LOW (ref 4.22–5.81)
RDW: 12.6 % (ref 11.5–15.5)
WBC: 7.2 10*3/uL (ref 4.0–10.5)
nRBC: 0 % (ref 0.0–0.2)

## 2021-06-16 LAB — BASIC METABOLIC PANEL
Anion gap: 8 (ref 5–15)
BUN: 22 mg/dL (ref 8–23)
CO2: 28 mmol/L (ref 22–32)
Calcium: 9.2 mg/dL (ref 8.9–10.3)
Chloride: 96 mmol/L — ABNORMAL LOW (ref 98–111)
Creatinine, Ser: 1.21 mg/dL (ref 0.61–1.24)
GFR, Estimated: >60 mL/min (ref >60)
Glucose, Bld: 187 mg/dL — ABNORMAL HIGH (ref 70–99)
Potassium: 4.3 mmol/L (ref 3.5–5.1)
Sodium: 132 mmol/L — ABNORMAL LOW (ref 135–145)

## 2021-06-16 NOTE — Discharge Summary (Signed)
? ?Name: Thomas Frye ?MRN: [id number] ?DOB: 1955/10/20 66 y.o. ?PCP: [name], MD ? ?Date of Admission: 06/10/2021  5:04 AM ?Date of Discharge:  06/17/2021 ?Attending Physician: Dr. Angelia Mould ? ?DISCHARGE DIAGNOSIS:  ?Primary Problem: Cellulitis of left lower extremity without foot  ? ?Hospital Problems: ?Principal Problem: ?  Cellulitis of left lower extremity without foot ?Active Problems: ?  AKI (acute kidney injury) ([place]) ?  OSA (obstructive sleep apnea) ?  Type 2 diabetes mellitus with hyperglycemia (HCC) ?  Pancreatic insufficiency ?  Infection of left below knee amputation ([place]) ?  Osteomyelitis of left tibia [facility]) ?  ? ?DISCHARGE MEDICATIONS:  ? ?Allergies as of 06/17/2021   ? ?   Reactions  ? Latex Swelling  ? Sulfa Antibiotics Hives  ? ?  ? ?  ?Medication List  ?  ? ?TAKE these medications   ? ?acetaminophen 325 MG tablet ?Commonly known as: TYLENOL ?Take 650 mg by mouth every 6 (six) hours as needed for moderate pain. ?  ?amitriptyline 25 MG tablet ?Commonly known as: ELAVIL ?Take 25 mg by mouth at bedtime. ?  ?amLODipine 10 MG tablet ?Commonly known as: NORVASC ?Take 1 tablet (10 mg total) by mouth daily. ?  ?atorvastatin 10 MG tablet ?Commonly known as: LIPITOR ?Take 10 mg by mouth at bedtime. ?  ?cephALEXin 500 MG capsule ?Commonly known as: KEFLEX ?Take 1 capsule (500 mg total) by mouth every 6 (six) hours. ?  ?clopidogrel 75 MG tablet ?Commonly known as: PLAVIX ?Take 75 mg by mouth daily. ?  ?doxazosin 1 MG tablet ?Commonly known as: CARDURA ?Take 0.5 tablets (0.5 mg total) by mouth daily. ?  ?ferrous sulfate 325 (65 FE) MG tablet ?Take 325 mg by mouth daily with breakfast. ?  ?FreeStyle Libre 14 Day Reader Kerrin Mo ?by Does not apply route. ?  ?insulin aspart 100 UNIT/ML injection ?Commonly known as: novoLOG ?CHECK YOUR CBG 3 TIMES A DAY WITH MEALS AND USE A SLIDING SCALE AS FOLLOWS: ?For CBG 121-150 inject 1unit of insulin ?For CBG 151-200 inject 2units of insulin ?For CBG 201-250 inject  3units of insulin  ?For CBG 251-300 inject 5units of insulin ?For CBG 301-350 inject 7units of insulin ?For CBG 351-400 inject 9units of insulin ?For CBG >400 call your MD ?  ?insulin regular 100 units/mL injection ?Commonly known as: NOVOLIN R ?follow insulin pump, Soln, Subcutaneous, As Indicated, 10 mL, 0 Refill(s), Route to Pharmacy Electronically, [facility], [place], 06/20/20 6:38:00 EDT, Height/Length Dosing, cm, 140.6, 06/20/20 6:38:00 EDT, Weight Dosing, kg ?  ?lisinopril 20 MG tablet ?Commonly known as: ZESTRIL ?Take 20 mg by mouth daily. ?  ?loperamide 2 MG capsule ?Commonly known as: IMODIUM ?Take by mouth as needed for diarrhea or loose stools. ?  ?metoCLOPramide 5 MG tablet ?Commonly known as: Reglan ?Take 1 tablet (5 mg total) by mouth 4 (four) times daily. ?  ?omeprazole 40 MG capsule ?Commonly known as: PRILOSEC ?Take 40 mg by mouth 2 (two) times daily. ?  ?Omnipod DASH Pods (Gen 4) Misc ?USE 1 POD EVERY 2 DAYS AS NEEDED ?  ?Omnipod DASH Pods (Gen 4) Misc ?Inject into the skin. ?  ?Pancrelipase (Lip-Prot-Amyl) 25000-79000 units Cpep ?Take 1-2 capsules by mouth See admin instructions. 2 capsules  with meals  ?1 capsule with snacks ?  ?pramipexole 0.125 MG tablet ?Commonly known as: MIRAPEX ?Take 0.125 mg by mouth at bedtime. ?  ?PROBIOTIC PO ?Take 1 tablet by mouth daily. ?  ?QUEtiapine 50 MG tablet ?Commonly  known as: SEROQUEL ?Take 50 mg by mouth at bedtime. ?  ?Tyler Aas FlexTouch 100 UNIT/ML FlexTouch Pen ?Generic drug: insulin degludec ?Inject 60 Units into the skin daily. ?  ?venlafaxine XR 150 MG 24 hr capsule ?Commonly known as: EFFEXOR-XR ?Take 150 mg by mouth daily with breakfast. ?  ?vitamin E 1000 UNIT capsule ?Take 1,000 Units by mouth in the morning and at bedtime. ?  ? ?  ? ?  ?  ? ? ?  ?Durable Medical Equipment  ?(From admission, onward)  ?  ? ? ?  ? ?  Start     Ordered  ? 06/11/21 1438  For home use only DME 3 n 1  Once       ? 06/11/21 1437  ? ?  ?  ? ?  ? ? ?   ?Discharge Care Instructions  ?(From admission, onward)  ?  ? ? ?  ? ?  Start     Ordered  ? 06/17/21 0000  Change dressing (specify)       ?Comments: Dressing change: 1 times per day.  ? 06/17/21 1411  ? 06/11/21 0000  Change dressing (specify)       ?Comments: Dressing change: Change dressing daily  ? 06/11/21 1629  ? ?  ?  ? ?  ? ? ?DISPOSITION AND FOLLOW-UP:  ?Thomas Frye was discharged from [facility] in Stable condition. At the hospital follow up visit please address: ? ?#Moderate nonpurulent cellulitis of left stump with wound sinus ?#L tibia osteomyelitis  ?Monitor for signs of worsened infection. Follow-up with plan from follow-up visit with Dr. Terrial Rhodes and completes full course of antibiotic therapy. Recheck CBC. ?  ?#OSA ?Consider outpatient referral for CPAP if needed. ?  ?#HTN ?Monitor blood pressure with regimen of lisinopril plus new amlodipine. ? ?Follow-up Recommendations: ?Consults: Orthopedic surgery ?Labs: Basic Metabolic Profile and CBC ?Studies: None ?Medications:  ? ?START these medications: ?Keflex 500 mg every 6 hours for 6 more days with remaining two doses for today as outpatient  ?Amlodipine 10 mg once daily ?No other medication changes made. ? ?Follow-up Appointments: ? Follow-up Information   ? ? [name], MD Follow up on 06/16/2021.   ?Specialty: Vascular Surgery ?Why: At 2:30 PM ?Contact information: ?[address] ?[address] ?[place] [address] ?[phone] ? ? ?  ?  ? ? [name], MD. Schedule an appointment as soon as possible for a visit in 1 week(s).   ?Specialty: Geriatric Medicine ?Contact information: ?[address] ?[place] [address] ?[phone] ? ? ?  ?  ? ?  ?  ? ?  ? ? ?HOSPITAL COURSE:  ?Patient Summary: ?Chronic osteomyelitis of L BKA stump with surrounding cellulitis ?Patient presented with draining wound over L BKA stump. ESR and CRP were elevated but patient was hemodynamically  stable without fever or leukocytosis. MRI showed open wound involving the distal aspect of the left lower extremity anteriorly near the amputation site, associated cellulitis and myofasciitis, no discrete rim enhancing abscess is identified; osteomyelitis involving the tibia near the amputation site; no findings for septic arthritis at the knee joint. Wound culture showed pansensitive staph aureus and blood culture showed no growth. He was started on ceftriaxone and metronidazole on admission and vancomycin was added 03/17. Vancomycin was changed to doxycycline 03/18 and metronidazole was continued. Antibiotic regimen was changed further to Keflex 03/21. He was hemodynamically stable without fever or leukocytosis at time of discharge. ? ?DM with complications  of retinopathy, neuropathy, and PVD  ?Patient was hyperglycemic on presentation. He was started on Levemir nightly and mealtime insulin with sliding scale coverage with nighttime correction, and this was slowly titrated up over the course of admission. He did have a few hypoglycemic episodes which corrected with food and juice. ?  ?AKI, resolved ?Admission labs showed creatinine of 1.70, increased from baseline of around 1.0-1.10. Home lisinopril was held on admission and restarted with resolution of AKI. ?  ?OSA ?CPAP at night. ?  ?Pancreatic insufficiency ?Continued home pancreatic enzymes with meals.  ?  ?HTN ?Patient was normotensive throughout admission. Home lisinopril was restarted with resolution of AKI and amlodipine was started with appropriate blood pressure response. ? ?MDD ?GAD ?Continued home venlafaxine XR, quetiapine, and amitriptyline. ?  ?GERD ?Managed with Protonix 40 mg daily.  ? ?DISCHARGE INSTRUCTIONS:  ? ?Discharge Instructions   ? ? Call MD for:  redness, tenderness, or signs of infection (pain, swelling, redness, odor or green/yellow discharge around incision site)   Complete by: As directed ?  ? Call MD for:  severe uncontrolled pain    Complete by: As directed ?  ? Call MD for:  temperature >100.4   Complete by: As directed ?  ? Change dressing (specify)   Complete by: As directed ?  ? Dressing change: Change dressing daily  ? Change dressi

## 2021-06-16 NOTE — Progress Notes (Signed)
Physical Therapy Treatment ?Patient Details ?Name: Thomas Frye ?MRN: [id number] ?DOB: Sep 06, 1955 ?Today's Date: 06/16/2021 ? ? ?History of Present Illness Pt is a 66 y/o male presenting on 3/16 with L BKA wound, recent fall out of bed.  MRI with cellulitis, osteomyelitis involving the tibia. 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 ? ?  ?PT Comments  ? ? Pt expresses desire to work on standing balance intervention today, tolerated well with intermittent rest breaks and min-mod PT truncal support. Pt performed repeated sit<>stand transfers, scoot transfer, and standing exercises to focus on balance and maintaining LLE ROM. Follow up recommendation remains appropriate at this time, will continue to follow acutely.  ?   ?Recommendations for follow up therapy are one component of a multi-disciplinary discharge planning process, led by the attending physician.  Recommendations may be updated based on patient status, additional functional criteria and insurance authorization. ? ?Follow Up Recommendations ? Skilled nursing-short term rehab (<3 hours/day) ?  ?  ?Assistance Recommended at Discharge Frequent or constant Supervision/Assistance  ?Patient can return home with the following A lot of help with walking and/or transfers;A lot of help with bathing/dressing/bathroom;Assistance with cooking/housework;Assist for transportation;Help with stairs or ramp for entrance ?  ?Equipment Recommendations ? Other (comment) (drop arm BSC, slide board - next venue)  ?  ?Recommendations for Other Services   ? ? ?  ?Precautions / Restrictions Precautions ?Precautions: Fall ?Precaution Comments: L BKA with cellulitis and wound to distal end of residual limb ?Restrictions ?Weight Bearing Restrictions: No ?Other Position/Activity Restrictions: No orders in, assume protective NWB to L  residual limb due to wound, not safe to use prosthetic at this time  ?  ? ?Mobility ? Bed Mobility ?Overal bed mobility: Needs Assistance ?Bed Mobility: Supine to Sit, Sit to Supine ?  ?  ?Supine to sit: Min guard ?Sit to supine: Min guard ?  ?General bed mobility comments: for safety, requires HOB elevated and bedrails. ?  ? ?Transfers ?Overall transfer level: Needs assistance ?Equipment used: Rolling walker (2 wheels) ?Transfers: Sit to/from Stand, Bed to chair/wheelchair/BSC ?Sit to Stand: Mod assist, +2 safety/equipment ?Stand pivot transfers: Mod assist ?  ?  ?  ? Lateral/Scoot Transfers: Min assist ?General transfer comment: mod assist for power up from low surface, rise, steadying. Cues for hand placement when rising/sitting. STS x3, from EOB and chair x2. Stand pivot with mod A for steadying, guiding RW, and steadying pt. scoot pivot to R via drop arm recliner back to bed, min assist for set up and trunk support during pivot. ?  ? ?Ambulation/Gait ?  ?  ?  ?  ?  ?  ?  ?General Gait Details: nt ? ? ?Stairs ?  ?  ?  ?  ?  ? ? ?Wheelchair Mobility ?  ? ?Modified Rankin (Stroke Patients Only) ?  ? ? ?  ?Balance Overall balance assessment: Needs assistance ?Sitting-balance support: No upper extremity supported, Feet supported ?Sitting balance-Leahy Scale: Good ?  ?  ?Standing balance support: Bilateral upper extremity supported, Reliant on assistive device for balance ?Standing balance-Leahy Scale: Poor ?Standing balance comment: reliant on external support especially in dynamic standing, preference for L and posterior lean ?  ?  ?  ?  ?  ?  ?  ?  ?  ?  ?  ?  ? ?  ?Cognition Arousal/Alertness: Awake/alert ?Behavior During Therapy: [facility] for  tasks assessed/performed ?Overall Cognitive Status: Within Functional Limits for tasks assessed ?  ?  ?  ?  ?  ?  ?  ?  ?  ?  ?  ?  ?  ?  ?  ?Problem Solving: Slow processing ?General Comments: slow processing, cues for form and safety during transfer-level mobility ?  ?  ? ?   ?Exercises Other Exercises ?Other Exercises: LLE: Standing hip abd x5, standing hip flexion x5, standing hip ext x5. RLE, standing: toe raises 2x5, heel raises 2x5, AP leaning x5 ? ?  ?General Comments   ?  ?  ? ?Pertinent Vitals/Pain Pain Assessment ?Pain Assessment: Faces ?Faces Pain Scale: Hurts a little bit ?Pain Location: L residual limb ?Pain Descriptors / Indicators: Discomfort ?Pain Intervention(s): Limited activity within patient's tolerance, Monitored during session, Repositioned  ? ? ?Home Living   ?  ?  ?  ?  ?  ?  ?  ?  ?  ?   ?  ?Prior Function    ?  ?  ?   ? ?PT Goals (current goals can now be found in the care plan section) Acute Rehab PT Goals ?Patient Stated Goal: to get stronger so I can golf again with my sons and father ?PT Goal Formulation: With patient ?Time For Goal Achievement: 06/25/21 ?Potential to Achieve Goals: Good ?Progress towards PT goals: Progressing toward goals ? ?  ?Frequency ? ? ? Min 3X/week ? ? ? ?  ?PT Plan Current plan remains appropriate  ? ? ?Co-evaluation   ?  ?  ?  ?  ? ?  ?AM-PAC PT "6 Clicks" Mobility   ?Outcome Measure ? Help needed turning from your back to your side while in a flat bed without using bedrails?: A Little ?Help needed moving from lying on your back to sitting on the side of a flat bed without using bedrails?: A Little ?Help needed moving to and from a bed to a chair (including a wheelchair)?: A Lot ?Help needed standing up from a chair using your arms (e.g., wheelchair or bedside chair)?: A Lot ?Help needed to walk in hospital room?: A Lot ?Help needed climbing 3-5 steps with a railing? : Total ?6 Click Score: 13 ? ?  ?End of Session Equipment Utilized During Treatment: Gait belt ?Activity Tolerance: Patient tolerated treatment well ?Patient left: in bed;with call bell/phone within reach;with bed alarm set ?Nurse Communication: Mobility status (pt asking about contacting vascular MD office about his appt in [place] tmrw) ?PT Visit Diagnosis: Other  abnormalities of gait and mobility (R26.89);Difficulty in walking, not elsewhere classified (R26.2) ?  ? ? ?Time: 1006-1036 (x5 mins not included in chargeable time, MDs rounding) ?PT Time Calculation (min) (ACUTE ONLY): 30 min ? ?Charges:  $Therapeutic Activity: 8-22 mins ?$Neuromuscular Re-education: 8-22 mins          ?          ? ?Marye Round, PT DPT ?Acute Rehabilitation Services ?Pager [phone]  ?Office [phone] ? ? ? ?Thomas Frye ?06/16/2021, 10:52 AM ? ?

## 2021-06-16 NOTE — TOC Progression Note (Signed)
Transition of Care (TOC) - Progression Note  ? ? ?Patient Details  ?Name: Thomas Frye ?MRN: [id number] ?Date of Birth: Aug 31, 1955 ? ?Transition of Care (TOC) CM/SW Contact  ?Jimmy Picket, LCSW ?Phone Number: ?06/16/2021, 11:21 AM ? ?Clinical Narrative:    ? ?Pts insurance Berkley Harvey was started this morning by [facility].  ? ?Expected Discharge Plan: Skilled Nursing Facility ?Barriers to Discharge: SNF Pending bed offer ? ?Expected Discharge Plan and Services ?Expected Discharge Plan: Skilled Nursing Facility ?In-house Referral: Clinical Social Work ?  ?Post Acute Care Choice: Skilled Nursing Facility ?Living arrangements for the past 2 months: Single Family Home ?Expected Discharge Date: 06/11/21               ?  ?  ?  ?  ?  ?  ?  ?  ?  ?  ? ? ?Social Determinants of Health (SDOH) Interventions ?  ? ?Readmission Risk Interventions ?   ? View : No data to display.  ?  ?  ?  ? ?Jimmy Picket, LCSW ?Clinical Social Worker ? ? ?

## 2021-06-16 NOTE — Progress Notes (Addendum)
? ?HD#0 ?SUBJECTIVE:  ?Patient Summary: Thomas Frye is a 66 y.o. 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  ? ?Overnight Events: None ? ?Interim History: Patient continues to feel okay this morning and has not had recurrence of fever or worsening pain, drainage, or warmth to the L BKA stump. He is eager to go to SNF to build back up to baseline so he can return to normal life with his wife, four children, and eleven grandchildren. He explains that his wife was able to contact Dr. Harriet Masson office to reschedule his visit for next week. ? ?OBJECTIVE:  ?Vital Signs: ?Vitals:  ? 06/15/21 2007 06/15/21 2327 06/16/21 0500 06/16/21 0546  ?BP:  127/68  123/68  ?Pulse: 88 78  79  ?Resp: 16 16  17   ?Temp:  (!) 97.3 ?F (36.3 ?C)  98.1 ?F (36.7 ?C)  ?TempSrc:  Oral  Oral  ?SpO2: 94% 95%  91%  ?Weight:   134 kg   ?Height:      ? ?Supplemental O2: Room Air ?SpO2: 91 % ?O2 Flow Rate (L/min): 3 L/min ? ?Filed Weights  ? 06/14/21 0440 06/15/21 0448 06/16/21 0500  ?Weight: (!) 136.2 kg 134.7 kg 134 kg  ? ? ? ?Intake/Output Summary (Last 24 hours) at 06/16/2021 0748 ?Last data filed at 06/16/2021 0548 ?Gross per 24 hour  ?Intake 180 ml  ?Output 2600 ml  ?Net -2420 ml  ? ?Net IO Since Admission: -6,436.39 mL [06/16/21 0748] ? ?Physical Exam: ?Constitutional:Patient is up in bedside recliner, working with physical therapy. No acute distress. ?Cardio:Regular rate and rhythm. No murmurs, rubs, gallops. ?Pulm:Normal work of breathing on room air.  ?Abdomen:Soft, nontender, nondistended. ?MSK: LLE with BKA that is not increased in warmth, erythema, or edema. Wound dressing from yesterday  does have some dried yellow-tinged and serosanguinous drainage. No active drainage from area of concern. ?Skin:Warm and dry. ?Neuro:Alert and oriented x3. No focal deficit noted.  ?Psych:Normal mood and affect. ? ?Patient Lines/Drains/Airways Status   ? ? Active Line/Drains/Airways   ? ? Name Placement date Placement time Site Days  ? Peripheral IV 06/10/21 20 G Right Hand 06/10/21  0558  Hand  6  ? 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 08/18/12  1404  -- 3224  ? Wound / Incision (Open or Dehisced) 06/10/21 Diabetic ulcer Leg Left apx 0.5cm diameter, circular, small serorsang drainage 06/10/21  0819  Leg  6  ? ?  ?  ? ?  ? ? ? ?ASSESSMENT/PLAN:  ?Assessment: ?Principal Problem: ?  Cellulitis of left lower extremity without foot ?Active Problems: ?  AKI (acute kidney injury) ([place]) ?  OSA (obstructive sleep apnea) ?  Type 2 diabetes mellitus with hyperglycemia (HCC) ?  Pancreatic insufficiency ?  Infection of left below knee amputation ([place]) ?  Osteomyelitis of left tibia [facility]) ? ? ?Plan: ?#Osteomyelitis of left BKA stump with surrounding cellulitis  ?Patient was transitioned to Keflex 500 mg q6h 03/21. Overall the wound is improving each day with improvements in erythema, edema, warmth, and pain; in addition, drainage from the wound site has decreased significantly and is not  actively draining on exam. He remains afebrile without leukocytosis, hemodynamically stable.  ?-Continue Keflex 500 mg q6h for at least 8 more days (further antibiotics or surgery likely needed for osteomyelitis) ?-Tylenol PRN pain ?  ?#DM with hyperglycemia, with complications of retinopathy, neuropathy and PVD  ?CBG have been <200 over the last 24 hours. Regimen was adjusted to semglee 40 units at bedtime, novoLOG 12 units three times daily with meals with SSI, and nighttime correction. ?-Continue semglee 40 units daily at bedtime, novoLOG 12 units three times daily with meals, plus  SSI ?  ?#AKI, resolved. ?-Daily BMP ?-Encourage PO hydration ?  ?#OSA ?Continue CPAP at night.  ?-He will need to follow-up outpatient for home CPAP.  ?  ?#Pancreatic insufficiency ?-Continue pancreatic enzymes with meals ?  ?#HTN ?Amlodipine 10 mg started 03/19, BP appropriate. ?-Continue lisinopril 20mg  daily ?-Continue amlodipine 10mg  daily  ?  ?#MDD ?#GAD ?-Continue home medications venlafaxine XR, quetiapine, and amitriptyline. ?  ?#GERD ?On omeprazole 40 mg twice daily at home. ?-Continue Protonix 40 mg daily ? ?Best Practice: ?Diet: Diabetic diet ?IVF: None ?VTE: enoxaparin (LOVENOX) injection 40 mg Start: 06/10/21 1200 ?Code: Full ?AB: Keflex 500 mg q6h ?Therapy Recs: SNF ?DISPO: Anticipated discharge  today or tomorrow  to Skilled nursing facility pending Insurance for SNF coverage. ? ?Signature: ?Farrel Gordon, D.O.  ?Internal Medicine Resident, PGY-1 ?[facility]  ?Pager: [phone] ?7:48 AM, 06/16/2021  ? ?Please contact the on call pager after 5 pm and on weekends at [phone]. ? ?

## 2021-06-16 NOTE — Progress Notes (Signed)
Patient refused cpap tonight. Patient stated he is drinking orange juice due to low sugar and doesn't want to wear tonight because he is trying to raise his sugar. ?

## 2021-06-17 ENCOUNTER — Other Ambulatory Visit (HOSPITAL_COMMUNITY): Payer: Self-pay

## 2021-06-17 DIAGNOSIS — T8744 Infection of amputation stump, left lower extremity: Secondary | ICD-10-CM | POA: Diagnosis not present

## 2021-06-17 DIAGNOSIS — L03116 Cellulitis of left lower limb: Secondary | ICD-10-CM | POA: Diagnosis not present

## 2021-06-17 DIAGNOSIS — M86462 Chronic osteomyelitis with draining sinus, left tibia and fibula: Secondary | ICD-10-CM | POA: Diagnosis not present

## 2021-06-17 LAB — CBC
HCT: 33.7 % — ABNORMAL LOW (ref 39.0–52.0)
Hemoglobin: 11.3 g/dL — ABNORMAL LOW (ref 13.0–17.0)
MCH: 31.9 pg (ref 26.0–34.0)
MCHC: 33.5 g/dL (ref 30.0–36.0)
MCV: 95.2 fL (ref 80.0–100.0)
Platelets: 284 10*3/uL (ref 150–400)
RBC: 3.54 MIL/uL — ABNORMAL LOW (ref 4.22–5.81)
RDW: 12.8 % (ref 11.5–15.5)
WBC: 8.3 10*3/uL (ref 4.0–10.5)
nRBC: 0 % (ref 0.0–0.2)

## 2021-06-17 LAB — GLUCOSE, CAPILLARY
Glucose-Capillary: 353 mg/dL — ABNORMAL HIGH (ref 70–99)
Glucose-Capillary: 400 mg/dL — ABNORMAL HIGH (ref 70–99)
Glucose-Capillary: 257 mg/dL — ABNORMAL HIGH (ref 70–99)
Glucose-Capillary: 267 mg/dL — ABNORMAL HIGH (ref 70–99)

## 2021-06-17 MED ORDER — CEPHALEXIN 500 MG PO CAPS
500.0000 mg | ORAL_CAPSULE | Freq: Four times a day (QID) | ORAL | 0 refills | Status: DC
  Filled 2021-06-17: qty 26, 7d supply, fill #0

## 2021-06-17 MED ORDER — INSULIN ASPART 100 UNIT/ML IJ SOLN
0.0000 [IU] | INTRAMUSCULAR | Status: DC
  Administered 2021-06-17: 11 [IU] via SUBCUTANEOUS

## 2021-06-17 MED ORDER — AMLODIPINE BESYLATE 10 MG PO TABS
10.0000 mg | ORAL_TABLET | Freq: Every day | ORAL | 2 refills | Status: DC
  Filled 2021-06-17: qty 30, 30d supply, fill #0

## 2021-06-17 NOTE — Progress Notes (Signed)
Patient refused Semglee dose due to CBG of 66.Patient ate crackers with peanut butter rechecked CBG went up to 101. Patient still did not want insulin.  ?

## 2021-06-17 NOTE — Progress Notes (Deleted)
? ? ?SUBJECTIVE:  ?Patient Summary: Thomas Frye is a 66 y.o. 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  ? ?Overnight Events: Hypoglycemic event to 66; semglee held at that time. ? ?Interim History: Patient is overall unchanged from previous days and denies pain, fever, chills. He is still eagerly waiting for insurance approval so that he can go to a rehab facility.  ? ?OBJECTIVE:  ?Vital Signs: ?Vitals:  ? 06/17/21 0500 06/17/21 0508 06/17/21 0722 06/17/21 0857  ?BP:  (!) 149/86 129/78 140/66  ?Pulse:  81 88 91  ?Resp:  18 18 18   ?Temp:  98.1 ?F (36.7 ?C) 97.8 ?F (36.6 ?C) (!) 97.5 ?F (36.4 ?C)  ?TempSrc:  Oral Oral Oral  ?SpO2:  96% 96% 93%  ?Weight: 134 kg     ?Height:      ? ?Supplemental O2: Room Air ?SpO2: 93 % ?O2 Flow Rate (L/min): 3 L/min ? ?Filed Weights  ? 06/15/21 0448 06/16/21 0500 06/17/21 0500  ?Weight: 134.7 kg 134 kg 134 kg  ? ? ? ?Intake/Output Summary (Last 24 hours) at 06/17/2021 1252 ?Last data filed at 06/17/2021 0901 ?Gross per 24 hour  ?Intake 600 ml  ?Output 1050 ml  ?Net -450 ml  ? ?Net IO Since Admission: -6,846.39 mL [06/17/21 1252] ? ?Physical Exam: ?Constitutional:No acute distress. ?Cardio:Regular rate and rhythm. No murmurs, rubs, gallops. ?Pulm:Normal work of breathing on room air. ?Abdomen:Soft, nontender, nondistended. ?MSK:LLE with BKA and sinus lesion at distal aspect of stump that is not erythematous or edematous and no increased warmth. Minimal clear drainage noted at opening of sinus on stump. ?Skin:Warm and dry. ?Neuro:Alert and oriented x3. No focal deficit noted. ?Psych:Normal mood and affect. ? ?Patient Lines/Drains/Airways  Status   ? ? Active Line/Drains/Airways   ? ? Name Placement date Placement time Site Days  ? Peripheral IV 06/10/21 20 G Right Hand 06/10/21  0558  Hand  7  ? 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 08/18/12  1404  -- 3225  ? Wound / Incision (Open or Dehisced) 06/10/21 Diabetic ulcer Leg Left apx 0.5cm diameter, circular, small serorsang drainage 06/10/21  0819  Leg  7  ? ?  ?  ? ?  ? ? ? ?ASSESSMENT/PLAN:  ?Assessment: ?Principal Problem: ?  Cellulitis of left lower extremity without foot ?Active Problems: ?  AKI (acute kidney injury) ([place]) ?  OSA (obstructive sleep apnea) ?  Type 2 diabetes mellitus with hyperglycemia ([facility]) ?  Pancreatic insufficiency ?  Infection of left below knee amputation ([place]) ?  Osteomyelitis of left tibia [facility]) ? ? ?Plan: ?#Osteomyelitis of left BKA stump with surrounding cellulitis  ?Patient was transitioned to Keflex 500 mg q6h 03/21. Overall the wound is improving each day and is no longer erythematous or edematous. Drainage is minimal on exam and just within wound sinus itself, appearing clear. He remains afebrile without leukocytosis, hemodynamically stable.  ?-Continue Keflex 500 mg q6h for at least 7 more days (further antibiotics or surgery likely needed for osteomyelitis) ?-Tylenol PRN pain ?  ?#DM with hyperglycemia, with complications of retinopathy, neuropathy and PVD  ?Overall CBG  have been appropriate however he did have a reading overnight of 66. At that time [facility] and unfortunately this morning he had large spikes to 353>400. In the event of future hypoglycemic events semglee does not need to be held. ?-Continue semglee 40 units daily at bedtime, novoLOG 12 units three times daily with meals, plus SSI ?  ?#AKI, resolved. ?-Daily BMP ?-Encourage PO hydration ?  ?#OSA ?Continue CPAP at night.  ?-He will need to follow-up outpatient for home CPAP.  ?  ?#Pancreatic insufficiency ?-Continue pancreatic enzymes with  meals ?  ?#HTN ?Amlodipine 10 mg started 03/19, BP remains appropriate. ?-Continue lisinopril 20mg  daily ?-Continue amlodipine 10mg  daily  ?  ?#MDD ?#GAD ?-Continue home medications venlafaxine XR, quetiapine, and amitriptyline. ?  ?#GERD ?On omeprazole 40 mg twice daily at home. ?-Continue Protonix 40 mg daily ? ?Best Practice: ?Diet: Diabetic diet ?IVF: None ?VTE: enoxaparin (LOVENOX) injection 40 mg Start: 06/10/21 1200 ?Code: Full ?AB: Keflex 500 mg q6h ?Therapy Recs: SNF ?DISPO: Anticipated discharge  today or tomorrow  to Skilled nursing facility pending Insurance for SNF coverage. ? ?Signature: ?Farrel Gordon, D.O.  ?Internal Medicine Resident, PGY-1 ?[facility]  ?Pager: [phone] ?12:52 PM, 06/17/2021  ? ?Please contact the on call pager after 5 pm and on weekends at [phone].  ?

## 2021-06-17 NOTE — Progress Notes (Signed)
Left leg bandage changed before discharge. Extra dressings placed in bag for discharge. ?

## 2021-06-17 NOTE — Progress Notes (Signed)
Discharge instructions given to patient. Patient verbalized understanding of all teaching. Pt currently in room waiting for PTAR pick up. ?

## 2021-06-17 NOTE — TOC Progression Note (Signed)
Transition of Care (TOC) - Progression Note  ? ? ?Patient Details  ?Name: Thomas Frye ?MRN: [id number] ?Date of Birth: Jul 26, 1955 ? ?Transition of Care (TOC) CM/SW Contact  ?Emeterio Reeve, LCSW ?Phone Number: ?06/17/2021, 1:15 PM ? ?Clinical Narrative:    ? ?Pts insurance Josem Kaufmann is still pending.  ? ?Expected Discharge Plan: [place] ?Barriers to Discharge: SNF Pending bed offer ? ?Expected Discharge Plan and Services ?Expected Discharge Plan: [place] ?In-house Referral: Clinical Social Work ?  ?Post Acute Care Choice: [place] ?Living arrangements for the past 2 months: [place] ?Expected Discharge Date: 06/11/21               ?  ?  ?  ?  ?  ?  ?  ?  ?  ?  ? ? ?Social Determinants of Health (SDOH) Interventions ?  ? ?Readmission Risk Interventions ?   ? View : No data to display.  ?  ?  ?  ? ?Emeterio Reeve, LCSW ?Clinical Social Worker ? ?

## 2021-06-17 NOTE — Progress Notes (Addendum)
Inpatient Diabetes Program Recommendations ? ?AACE/ADA: New Consensus Statement on Inpatient Glycemic Control (2015) ? ?Target Ranges:  Prepandial:   less than 140 mg/dL ?     Peak postprandial:   less than 180 mg/dL (1-2 hours) ?     Critically ill patients:  140 - 180 mg/dL  ? ?Lab Results  ?Component Value Date  ? GLUCAP 257 (H) 06/17/2021  ? HGBA1C 8.0 (H) 06/11/2021  ? ? ?Review of Glycemic Control ? Latest Reference Range & Units 06/16/21 12:05 06/16/21 16:55 06/16/21 21:41 06/16/21 23:15 06/17/21 08:04 06/17/21 09:00 06/17/21 12:31  ?Glucose-Capillary 70 - 99 mg/dL 672 (H) 094 (H) 66 (L) 101 (H) 353 (H) 400 (H) 257 (H)  ? ?Diabetes history: DM 2 ?Outpatient Diabetes medications: Omnipod insulin pump with Novolin R insulin (uses pump for correction and meal coverage only), Freestyle libre 2 CGM, Tresiba 54 units ?Insulin pump settings: ?No basal rates ?Sensitivity 1 unit of insulin drops glucose 20 points ?Carb coverage 1 unit of insulin for every 4 grams of carbs ?Target glucose 100-120 ?Current orders for Inpatient glycemic control:  ?Novolog moderate tid with meals and HS ?Novolog 12 units tid with meals ?Semglee 40 units q HS ? ?Inpatient Diabetes Program Recommendations:   ? ?Note Semglee held last night.  May consider adding Semglee 40 units now instead of waiting until tonight due to history of Type 1 DM?  ? ?Thanks,  ?Beryl Meager, RN, BC-ADM ?Inpatient Diabetes Coordinator ?Pager [phone]  (8a-5p) ? ? ?

## 2021-06-17 NOTE — Progress Notes (Signed)
Discharge meds delivered to patients room ?

## 2021-06-17 NOTE — Progress Notes (Signed)
Report called to [place]. Spoke with nurse Senaida Ores that will be taking care of the patient there when he arrives. Nurse verbalized understanding of all teaching and had no further questions. PTAR called. Patient currently waiting for pick up. ?

## 2021-06-17 NOTE — Progress Notes (Signed)
Occupational Therapy Treatment ?Patient Details ?Name: Thomas Frye ?MRN: [id number] ?DOB: [birth year] ?Today's Date: 06/17/2021 ? ? ?History of present illness 66 y/o male presenting on 3/16 with L BKA wound, recent fall out of bed.  MRI with cellulitis, osteomyelitis involving the tibia. 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 ?  ?OT comments ? Pt demonstrated ability to perform bed mobility without physical assist, don his R shoe and sock in sitting at EOB, stand from elevated bed with RW and 2 person assist and transfer to chair. Pt with decreased awareness of safety and deficits, attempting to sit before aligned with chair. Pt continues to be appropriate for SNF level rehab.   ? ?Recommendations for follow up therapy are one component of a multi-disciplinary discharge planning process, led by the attending physician.  Recommendations may be updated based on patient status, additional functional criteria and insurance authorization. ?   ?Follow Up Recommendations ? Skilled nursing-short term rehab (<3 hours/day)  ?  ?Assistance Recommended at Discharge Frequent or constant Supervision/Assistance  ?Patient can return home with the following ? Two people to help with walking and/or transfers;A little help with bathing/dressing/bathroom;Direct supervision/assist for medications management;Assist for transportation;Help with stairs or ramp for entrance;Assistance with cooking/housework ?  ?Equipment Recommendations ? Other (comment) (defer to next venue)  ?  ?Recommendations for Other Services   ? ?  ?Precautions / Restrictions Precautions ?Precautions: Fall ?Precaution Comments: L BKA with cellulitis and wound to distal end of residual limb ?Restrictions ?Weight Bearing Restrictions: Yes ?LLE Weight Bearing: Non weight bearing ?Other Position/Activity  Restrictions: NWB on MD note with no prosthesis permitted  ? ? ?  ? ?Mobility Bed Mobility ?Overal bed mobility: Needs Assistance ?Bed Mobility: Supine to Sit ?  ?  ?Supine to sit: Min guard ?  ?  ?General bed mobility comments: HOB up ?  ? ?Transfers ?Overall transfer level: Needs assistance ?Equipment used: Rolling walker (2 wheels) ?Transfers: Sit to/from Stand, Bed to chair/wheelchair/BSC ?Sit to Stand: Min assist, +2 physical assistance, +2 safety/equipment ?  ?  ?Step pivot transfers: +2 physical assistance, Min assist ?  ?  ?General transfer comment: elevated bed, use of momentum, pt impulsively sits prior to being aligned with sitting surface ?  ?  ?Balance Overall balance assessment: Needs assistance ?Sitting-balance support: No upper extremity supported ?Sitting balance-Leahy Scale: Good ?Sitting balance - Comments: min guard as pt leans over quite a bit to change sock and put on shoe ?  ?Standing balance support: Bilateral upper extremity supported, During functional activity ?Standing balance-Leahy Scale: Poor ?Standing balance comment: +2 min assist, decreased safety awareness ?  ?  ?  ?  ?  ?  ?  ?  ?  ?  ?  ?   ? ?ADL either performed or assessed with clinical judgement  ? ?ADL Overall ADL's : Needs assistance/impaired ?  ?  ?  ?  ?  ?  ?  ?  ?  ?  ?Lower Body Dressing: Min guard;Sitting/lateral leans ?Lower Body Dressing Details (indicate cue type and reason): donned sock and shoe ?  ?  ?  ?  ?  ?  ?  ?  ?  ? ?Extremity/Trunk Assessment   ?  ?  ?  ?  ?  ? ?Vision   ?  ?  ?Perception   ?  ?Praxis   ?  ? ?Cognition Arousal/Alertness:  Awake/alert ?Behavior During Therapy: [facility] for tasks assessed/performed ?Overall Cognitive Status: No family/caregiver present to determine baseline cognitive functioning ?Area of Impairment: Safety/judgement ?  ?  ?  ?  ?  ?  ?  ?  ?  ?  ?  ?  ?Safety/Judgement: Decreased awareness of safety ?  ?  ?  ?  ?  ?   ?Exercises   ? ?  ?Shoulder Instructions   ? ? ?  ? ?Pertinent  Vitals/ Pain       Pain Assessment ?Pain Assessment: Faces ?Faces Pain Scale: Hurts a little bit ?Pain Location: L residual limb ?Pain Descriptors / Indicators: Guarding ?Pain Intervention(s): Repositioned ? ?Home Living   ?  ?  ?  ?  ?  ?  ?  ?  ?  ?  ?  ?  ?  ?  ?  ?  ?  ?  ? ?  ?Prior Functioning/Environment    ?  ?  ?  ?   ? ?Frequency ? Min 2X/week  ? ? ? ? ?  ?Progress Toward Goals ? ?OT Goals(current goals can now be found in the care plan section) ? Progress towards OT goals: Progressing toward goals ? ?Acute Rehab OT Goals ?OT Goal Formulation: With patient ?Time For Goal Achievement: 06/25/21 ?Potential to Achieve Goals: Good  ?Plan Frequency remains appropriate;Discharge plan needs to be updated   ? ?Co-evaluation ? ? ? PT/OT/SLP Co-Evaluation/Treatment: Yes ?Reason for Co-Treatment: For patient/therapist safety ?  ?  ?  ? ?  ?AM-PAC OT "6 Clicks" Daily Activity     ?Outcome Measure ? ? Help from another person eating meals?: None ?Help from another person taking care of personal grooming?: A Little ?Help from another person toileting, which includes using toliet, bedpan, or urinal?: A Lot ?Help from another person bathing (including washing, rinsing, drying)?: A Little ?Help from another person to put on and taking off regular upper body clothing?: A Little ?Help from another person to put on and taking off regular lower body clothing?: A Lot ?6 Click Score: 17 ? ?  ?End of Session Equipment Utilized During Treatment: Gait belt;Rolling walker (2 wheels) ? ?OT Visit Diagnosis: Other abnormalities of gait and mobility (R26.89) ?  ?Activity Tolerance Patient tolerated treatment well ?  ?Patient Left in chair;with call bell/phone within reach;with chair alarm set ?  ?Nurse Communication   ?  ? ?   ? ?Time: 0867-6195 ?OT Time Calculation (min): 24 min ? ?Charges: OT General Charges ?$OT Visit: 1 Visit ?OT Treatments ?$Self Care/Home Management : 8-22 mins ? ?Martie Round, OTR/L ?Acute Rehabilitation  Services ?Pager: [phone] ?Office: [phone]  ?Evern Bio ?06/17/2021, 2:36 PM ?

## 2021-06-17 NOTE — TOC Transition Note (Signed)
Transition of Care (TOC) - CM/SW Discharge Note ? ? ?Patient Details  ?Name: Thomas Frye ?MRN: [id number] ?Date of Birth: [birth year] ? ?Transition of Care (TOC) CM/SW Contact:  ?Jimmy Picket, LCSW ?Phone Number: ?06/17/2021, 2:39 PM ? ? ?Clinical Narrative:    ?  ?Per MD patient ready for DC to [facility]. RN, patient, patient's family, and facility notified of DC. Discharge Summary and FL2 sent to facility. DC packet on chart. \Ambulance transport requested for patient.  ?  ?RN to call report to [phone]. ? ?CSW will sign off for now as social work intervention is no longer needed. Please consult [place] again if new needs arise. ? ? ?Final next level of care: Skilled Nursing Facility ?Barriers to Discharge: SNF Pending bed offer ? ? ?Patient Goals and CMS Choice ?Patient states their goals for this hospitalization and ongoing recovery are:: walking, get back to normal ?CMS Medicare.gov Compare Post Acute Care list provided to:: Patient ?Choice offered to / list presented to : Patient ? ?Discharge Placement ?  ?           ?Patient chooses bed at: Other - please specify in the comment section below: [facility]) ?Patient to be transferred to facility by: [facility] ?Name of family member notified: Pt will call wife ?Patient and family notified of of transfer: 06/17/21 ? ?Discharge Plan and Services ?In-house Referral: Clinical Social Work ?  ?Post Acute Care Choice: Skilled Nursing Facility          ?  ?  ?  ?  ?  ?  ?  ?  ?  ?  ? ?Social Determinants of Health (SDOH) Interventions ?  ? ? ?Readmission Risk Interventions ?   ? View : No data to display.  ?  ?  ?  ? ? ?Jimmy Picket, LCSW ?Clinical Social Worker ? ? ? ?

## 2021-06-17 NOTE — Progress Notes (Signed)
Physical Therapy Treatment ?Patient Details ?Name: Thomas Frye ?MRN: [id number] ?DOB: Dec 30, 1955 ?Today's Date: 06/17/2021 ? ? ?History of Present Illness 66 y/o male presenting on 3/16 with L BKA wound, recent fall out of bed.  MRI with cellulitis, osteomyelitis involving the tibia. 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 ? ?  ?PT Comments  ? ? Pt was seen with OT to stand and to work on safety with all balance skills including transition on walker to the chair.  His effort is good but tends to be a bit unaware of situations where he might fall.  Pt was admitted for issues from a fall off the bed and required sitting balance guarding while handling his shoe and sock.  Also tends to be a bit over confident about getting to another surface.  Will recommend SNF care for the extent of his deficits, for wound management and for recovery of safety and independence for gait and transfers given the length of time that LLE may take to heal and allow prosthesis again.     ?Recommendations for follow up therapy are one component of a multi-disciplinary discharge planning process, led by the attending physician.  Recommendations may be updated based on patient status, additional functional criteria and insurance authorization. ? ?Follow Up Recommendations ? Skilled nursing-short term rehab (<3 hours/day) ?  ?  ?Assistance Recommended at Discharge Frequent or constant Supervision/Assistance  ?Patient can return home with the following A lot of help with walking and/or transfers;A lot of help with bathing/dressing/bathroom;Assistance with cooking/housework;Assist for transportation;Help with stairs or ramp for entrance ?  ?Equipment Recommendations ? Other (comment)  ?  ?Recommendations for Other Services   ? ? ?  ?Precautions / Restrictions Precautions ?Precautions:  Fall ?Precaution Comments: L BKA with cellulitis and wound to distal end of residual limb ?Restrictions ?Weight Bearing Restrictions: Yes ?LLE Weight Bearing: Non weight bearing ?Other Position/Activity Restrictions: NWB on MD note with no prosthesis permitted  ?  ? ?Mobility ? Bed Mobility ?Overal bed mobility: Needs Assistance ?Bed Mobility: Supine to Sit ?Rolling: Supervision ?Sidelying to sit: Min guard ?  ?  ?  ?General bed mobility comments: pt is able to get up with very minimal help but does use bedrail ?  ? ?Transfers ?Overall transfer level: Needs assistance ?Equipment used: Rolling walker (2 wheels) ?Transfers: Sit to/from Stand, Bed to chair/wheelchair/BSC ?Sit to Stand: Min assist, +2 physical assistance, +2 safety/equipment ?  ?Step pivot transfers: Min assist ?  ?  ?  ?  ?  ? ?Ambulation/Gait ?  ?  ?  ?  ?  ?  ?  ?General Gait Details: steps in transfers only ? ? ?Stairs ?  ?  ?  ?  ?  ? ? ?Wheelchair Mobility ?  ? ?Modified Rankin (Stroke Patients Only) ?  ? ? ?  ?Balance Overall balance assessment: Needs assistance ?Sitting-balance support: Single extremity supported ?Sitting balance-Leahy Scale: Good ?Sitting balance - Comments: min guard as pt leans over quite a bit to change sock and put on shoe ?  ?Standing balance support: Bilateral upper extremity supported, During functional activity ?Standing balance-Leahy Scale: Poor ?Standing balance comment: pt is a bit dismissive of help with therapists ?  ?  ?  ?  ?  ?  ?  ?  ?  ?  ?  ?  ? ?  ?Cognition Arousal/Alertness: Awake/alert ?Behavior During Therapy:  Impulsive, WFL for tasks assessed/performed ?Overall Cognitive Status: No family/caregiver present to determine baseline cognitive functioning ?Area of Impairment: Safety/judgement ?  ?  ?  ?  ?  ?  ?  ?  ?  ?  ?  ?  ?Safety/Judgement: Decreased awareness of safety ?  ?  ?General Comments: did talk with pt about the LLE dangling and he is aware that PT does not want to encourage LE edema there ?  ?   ? ?  ?Exercises   ? ?  ?General Comments General comments (skin integrity, edema, etc.): pt is up to side of bed with minimal concern for leaning forward on RLE which is in ER and not fully supporting him, dismissed PT trying to help with standing balance.  Impulsive ?  ?  ? ?Pertinent Vitals/Pain Pain Assessment ?Pain Assessment: Faces ?Faces Pain Scale: Hurts a little bit ?Pain Location: L residual limb ?Pain Descriptors / Indicators: Guarding ?Pain Intervention(s): Monitored during session, Repositioned  ? ? ?Home Living   ?  ?  ?  ?  ?  ?  ?  ?  ?  ?   ?  ?Prior Function    ?  ?  ?   ? ?PT Goals (current goals can now be found in the care plan section) Acute Rehab PT Goals ?Patient Stated Goal: to get stronger so I can golf again with my sons and father ?Progress towards PT goals: Progressing toward goals ? ?  ?Frequency ? ? ? Min 3X/week ? ? ? ?  ?PT Plan Current plan remains appropriate  ? ? ?Co-evaluation   ?  ?  ?  ?  ? ?  ?AM-PAC PT "6 Clicks" Mobility   ?Outcome Measure ? Help needed turning from your back to your side while in a flat bed without using bedrails?: A Little ?Help needed moving from lying on your back to sitting on the side of a flat bed without using bedrails?: A Little ?Help needed moving to and from a bed to a chair (including a wheelchair)?: A Little ?Help needed standing up from a chair using your arms (e.g., wheelchair or bedside chair)?: A Lot ?Help needed to walk in hospital room?: A Lot ?Help needed climbing 3-5 steps with a railing? : Total ?6 Click Score: 14 ? ?  ?End of Session Equipment Utilized During Treatment: Gait belt ?Activity Tolerance: Other (comment) (low awareness of safety) ?Patient left: in chair;with call bell/phone within reach;with chair alarm set ?Nurse Communication: Mobility status;Other (comment);Precautions (pt is not concerned about having LLE dangling instead of elevated) ?PT Visit Diagnosis: Other abnormalities of gait and mobility (R26.89);Difficulty in  walking, not elsewhere classified (R26.2) ?  ? ? ?Time: 5277-8242 ?PT Time Calculation (min) (ACUTE ONLY): 24 min ? ?Charges:  $Therapeutic Activity: 8-22 mins  ?Ivar Drape ?06/17/2021, 1:41 PM ? ?Samul Dada, PT PhD ?Acute Rehab Dept. Number: [facility] [phone] and [facility] [phone] ? ? ?

## 2021-08-09 ENCOUNTER — Encounter (HOSPITAL_COMMUNITY): Payer: Self-pay | Admitting: Emergency Medicine

## 2021-08-09 ENCOUNTER — Observation Stay (HOSPITAL_COMMUNITY): Payer: Medicare Other

## 2021-08-09 ENCOUNTER — Emergency Department (HOSPITAL_COMMUNITY): Payer: Medicare Other

## 2021-08-09 ENCOUNTER — Other Ambulatory Visit: Payer: Self-pay

## 2021-08-09 ENCOUNTER — Inpatient Hospital Stay (HOSPITAL_COMMUNITY)
Admission: EM | Admit: 2021-08-09 | Discharge: 2021-08-13 | DRG: 065 | Disposition: A | Discharge status: Skilled Nursing Facility | Payer: Medicare Other | Source: Skilled Nursing Facility | Attending: Internal Medicine | Admitting: Internal Medicine

## 2021-08-09 DIAGNOSIS — I159 Secondary hypertension, unspecified: Secondary | ICD-10-CM | POA: Diagnosis not present

## 2021-08-09 DIAGNOSIS — Z91040 Latex allergy status: Secondary | ICD-10-CM

## 2021-08-09 DIAGNOSIS — E669 Obesity, unspecified: Secondary | ICD-10-CM | POA: Diagnosis present

## 2021-08-09 DIAGNOSIS — Z794 Long term (current) use of insulin: Secondary | ICD-10-CM

## 2021-08-09 DIAGNOSIS — E1151 Type 2 diabetes mellitus with diabetic peripheral angiopathy without gangrene: Secondary | ICD-10-CM | POA: Diagnosis not present

## 2021-08-09 DIAGNOSIS — I110 Hypertensive heart disease with heart failure: Secondary | ICD-10-CM | POA: Diagnosis present

## 2021-08-09 DIAGNOSIS — I5032 Chronic diastolic (congestive) heart failure: Secondary | ICD-10-CM | POA: Diagnosis present

## 2021-08-09 DIAGNOSIS — K8689 Other specified diseases of pancreas: Secondary | ICD-10-CM | POA: Diagnosis present

## 2021-08-09 DIAGNOSIS — Z808 Family history of malignant neoplasm of other organs or systems: Secondary | ICD-10-CM

## 2021-08-09 DIAGNOSIS — Z803 Family history of malignant neoplasm of breast: Secondary | ICD-10-CM

## 2021-08-09 DIAGNOSIS — Z881 Allergy status to other antibiotic agents status: Secondary | ICD-10-CM

## 2021-08-09 DIAGNOSIS — E1042 Type 1 diabetes mellitus with diabetic polyneuropathy: Secondary | ICD-10-CM | POA: Diagnosis present

## 2021-08-09 DIAGNOSIS — R2990 Unspecified symptoms and signs involving the nervous system: Secondary | ICD-10-CM | POA: Diagnosis present

## 2021-08-09 DIAGNOSIS — R29810 Facial weakness: Secondary | ICD-10-CM | POA: Diagnosis present

## 2021-08-09 DIAGNOSIS — R4701 Aphasia: Secondary | ICD-10-CM | POA: Diagnosis present

## 2021-08-09 DIAGNOSIS — T8744 Infection of amputation stump, left lower extremity: Secondary | ICD-10-CM | POA: Diagnosis present

## 2021-08-09 DIAGNOSIS — E1065 Type 1 diabetes mellitus with hyperglycemia: Secondary | ICD-10-CM | POA: Diagnosis present

## 2021-08-09 DIAGNOSIS — D631 Anemia in chronic kidney disease: Secondary | ICD-10-CM | POA: Diagnosis present

## 2021-08-09 DIAGNOSIS — I10 Essential (primary) hypertension: Secondary | ICD-10-CM | POA: Diagnosis present

## 2021-08-09 DIAGNOSIS — E11649 Type 2 diabetes mellitus with hypoglycemia without coma: Secondary | ICD-10-CM | POA: Diagnosis present

## 2021-08-09 DIAGNOSIS — Z8673 Personal history of transient ischemic attack (TIA), and cerebral infarction without residual deficits: Secondary | ICD-10-CM

## 2021-08-09 DIAGNOSIS — G2581 Restless legs syndrome: Secondary | ICD-10-CM | POA: Diagnosis present

## 2021-08-09 DIAGNOSIS — Z6837 Body mass index (BMI) 37.0-37.9, adult: Secondary | ICD-10-CM

## 2021-08-09 DIAGNOSIS — Z7902 Long term (current) use of antithrombotics/antiplatelets: Secondary | ICD-10-CM

## 2021-08-09 DIAGNOSIS — E785 Hyperlipidemia, unspecified: Secondary | ICD-10-CM | POA: Diagnosis present

## 2021-08-09 DIAGNOSIS — E162 Hypoglycemia, unspecified: Secondary | ICD-10-CM

## 2021-08-09 DIAGNOSIS — Z79899 Other long term (current) drug therapy: Secondary | ICD-10-CM

## 2021-08-09 DIAGNOSIS — R4781 Slurred speech: Principal | ICD-10-CM

## 2021-08-09 DIAGNOSIS — G4733 Obstructive sleep apnea (adult) (pediatric): Secondary | ICD-10-CM | POA: Diagnosis present

## 2021-08-09 DIAGNOSIS — R29818 Other symptoms and signs involving the nervous system: Secondary | ICD-10-CM | POA: Diagnosis present

## 2021-08-09 DIAGNOSIS — Z833 Family history of diabetes mellitus: Secondary | ICD-10-CM

## 2021-08-09 DIAGNOSIS — I639 Cerebral infarction, unspecified: Secondary | ICD-10-CM | POA: Diagnosis present

## 2021-08-09 DIAGNOSIS — I6340 Cerebral infarction due to embolism of unspecified cerebral artery: Secondary | ICD-10-CM | POA: Diagnosis not present

## 2021-08-09 DIAGNOSIS — Y835 Amputation of limb(s) as the cause of abnormal reaction of the patient, or of later complication, without mention of misadventure at the time of the procedure: Secondary | ICD-10-CM | POA: Diagnosis present

## 2021-08-09 DIAGNOSIS — E1165 Type 2 diabetes mellitus with hyperglycemia: Secondary | ICD-10-CM | POA: Diagnosis present

## 2021-08-09 DIAGNOSIS — E10649 Type 1 diabetes mellitus with hypoglycemia without coma: Secondary | ICD-10-CM | POA: Diagnosis present

## 2021-08-09 DIAGNOSIS — K219 Gastro-esophageal reflux disease without esophagitis: Secondary | ICD-10-CM | POA: Diagnosis not present

## 2021-08-09 DIAGNOSIS — R471 Dysarthria and anarthria: Secondary | ICD-10-CM | POA: Diagnosis present

## 2021-08-09 DIAGNOSIS — F32A Depression, unspecified: Secondary | ICD-10-CM | POA: Diagnosis present

## 2021-08-09 DIAGNOSIS — R29702 NIHSS score 2: Secondary | ICD-10-CM | POA: Diagnosis present

## 2021-08-09 DIAGNOSIS — E1051 Type 1 diabetes mellitus with diabetic peripheral angiopathy without gangrene: Secondary | ICD-10-CM | POA: Diagnosis present

## 2021-08-09 LAB — DIFFERENTIAL
Abs Immature Granulocytes: 0.02 10*3/uL (ref 0.00–0.07)
Basophils Absolute: 0.1 10*3/uL (ref 0.0–0.1)
Basophils Relative: 1 %
Eosinophils Absolute: 0.2 10*3/uL (ref 0.0–0.5)
Eosinophils Relative: 3 %
Immature Granulocytes: 0 %
Lymphocytes Relative: 20 %
Lymphs Abs: 1.4 10*3/uL (ref 0.7–4.0)
Monocytes Absolute: 0.7 10*3/uL (ref 0.1–1.0)
Monocytes Relative: 10 %
Neutro Abs: 4.5 10*3/uL (ref 1.7–7.7)
Neutrophils Relative %: 66 %

## 2021-08-09 LAB — COMPREHENSIVE METABOLIC PANEL
ALT: 12 U/L (ref 0–44)
AST: 21 U/L (ref 15–41)
Albumin: 3.9 g/dL (ref 3.5–5.0)
Alkaline Phosphatase: 75 U/L (ref 38–126)
Anion gap: 9 (ref 5–15)
BUN: 16 mg/dL (ref 8–23)
CO2: 27 mmol/L (ref 22–32)
Calcium: 9.4 mg/dL (ref 8.9–10.3)
Chloride: 101 mmol/L (ref 98–111)
Creatinine, Ser: 1.15 mg/dL (ref 0.61–1.24)
GFR, Estimated: >60 mL/min (ref >60)
Glucose, Bld: 85 mg/dL (ref 70–99)
Potassium: 4.1 mmol/L (ref 3.5–5.1)
Sodium: 137 mmol/L (ref 135–145)
Total Bilirubin: 0.9 mg/dL (ref 0.3–1.2)
Total Protein: 7.6 g/dL (ref 6.5–8.1)

## 2021-08-09 LAB — CBC
HCT: 36.3 % — ABNORMAL LOW (ref 39.0–52.0)
Hemoglobin: 12.1 g/dL — ABNORMAL LOW (ref 13.0–17.0)
MCH: 32.8 pg (ref 26.0–34.0)
MCHC: 33.3 g/dL (ref 30.0–36.0)
MCV: 98.4 fL (ref 80.0–100.0)
Platelets: 230 10*3/uL (ref 150–400)
RBC: 3.69 MIL/uL — ABNORMAL LOW (ref 4.22–5.81)
RDW: 13.2 % (ref 11.5–15.5)
WBC: 7 10*3/uL (ref 4.0–10.5)
nRBC: 0 % (ref 0.0–0.2)

## 2021-08-09 LAB — PROTIME-INR
INR: 0.9 (ref 0.8–1.2)
Prothrombin Time: 12.5 seconds (ref 11.4–15.2)

## 2021-08-09 LAB — MAGNESIUM: Magnesium: 2.3 mg/dL (ref 1.7–2.4)

## 2021-08-09 LAB — CBG MONITORING, ED
Glucose-Capillary: 111 mg/dL — ABNORMAL HIGH (ref 70–99)
Glucose-Capillary: 62 mg/dL — ABNORMAL LOW (ref 70–99)
Glucose-Capillary: 81 mg/dL (ref 70–99)

## 2021-08-09 LAB — PHOSPHORUS: Phosphorus: 3.1 mg/dL (ref 2.5–4.6)

## 2021-08-09 LAB — APTT: aPTT: 29 seconds (ref 24–36)

## 2021-08-09 LAB — TSH: TSH: 0.614 u[IU]/mL (ref 0.350–4.500)

## 2021-08-09 MED ORDER — IOHEXOL 350 MG/ML SOLN
75.0000 mL | Freq: Once | INTRAVENOUS | Status: AC | PRN
  Administered 2021-08-09: 75 mL via INTRAVENOUS

## 2021-08-09 NOTE — H&P (Signed)
? ? ?ELLIAS Frye [id number] DOB: Jul 26, 1955 DOA: 08/09/2021 ? ? ?  ?PCP: [name], MD   ?Outpatient Specialists:   ?CARDS: at [place] ?[address] Dr. Posey Pronto ?  ?Patient arrived to ER on 08/09/21 at 1750 ?Referred by Attending Drenda Freeze, MD ? ? ?Patient coming from:   ?  ?From facility  [facility] ? ?Chief Complaint:   ?Chief Complaint  ?Patient presents with  ? Aphasia  ? ? ?HPI: ?Thomas Frye is a 66 y.o. 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  ? ?Presented with  slurred speech  ?Stroke like symptonms for 3 days has prior hx of CVA ? From SNF ?Has hx of left stumpt infection on vanc throught picc ?Left facial droop ? slurred speech and some symptoms of word finding difficulty that have been going off and on since Saturday morning with slurred speech being the prominent feature that has not since resolved with concern for small vessel etiology stroke.  ? Denies any CP no SOB ? Had to hold his vanc recently due to high levels ? ?  ?Regarding pertinent Chronic problems:   ? ? Hyperlipidemia -  on statins liptior ?Lipid Panel  ?   ?Component Value Date/Time  ? CHOL 152 08/25/2012 0500  ? TRIG 168 (H) 08/25/2012 0500  ? HDL 45 08/25/2012 0500  ? CHOLHDL 3.4 08/25/2012 0500  ? VLDL 34 08/25/2012 0500  ? [place]man 73 08/25/2012 0500  ? ? ? HTN on NOrvasc, lisinopril ? ? chronic CHF diastolic - last echo [id number] ?Grade 1 daistolic CHF  ?  ? ?  DM 2 -  ?Lab Results  ?Component Value Date  ? HGBA1C 8.0 (H) 06/11/2021  ? on insulin,   ? ? obesity-   ?BMI Readings from Last 1 Encounters:  ?08/09/21 37.00 kg/m?  ? ?   ? ?  Suspected OSA -not on CPAP ? ?  Hx of CVA -  with/out residual deficits on  Plavix ?  ?  ? ? Chronic anemia - baseline hg Hemoglobin & Hematocrit  ?Recent Labs  ?  06/16/21 ?0144 06/17/21 ?0118 08/09/21 ?1800  ?HGB 11.3* 11.3* 12.1*  ?  ?While in ER: ?  ?Neurolgy consulted  ?Seen  at [facility] ?Plan for MRI and CVA admit ?Transfer to [facility] ?  ?Ordered ? ?CTa HEAD No emergent large vessel occlusion or hemodynamically significant ?stenosis of the head or neck. ?2. 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 ? ?CXR -  PICC line is not visualized.  Please correlate clinically. ?  No other acute cardiopulmonary process. ?   ?Following Medications were ordered in ER: ?Medications  ?iohexol (OMNIPAQUE) 350 MG/ML injection 75 mL (75 mLs Intravenous Contrast Given 08/09/21 1925)  ?  ?_______________________________________________________ ?ER Provider Called:   Neurology   Dr. Birder Robson ?They Recommend admit to medicine   ? Dr. Rory Percy have seen in ER ?  ?ED Triage Vitals  ?Enc Vitals Group  ?   BP 08/09/21 1801 (!) 156/50  ?   Pulse Rate 08/09/21 1801 84  ?   Resp 08/09/21 1801 16  ?   Temp 08/09/21 1801 98.1 ?F (36.7 ?C)  ?   Temp Source 08/09/21 1801 Oral  ?   SpO2 08/09/21 1757 97 %  ?   Weight 08/09/21 1802 296 lb (134.3 kg)  ?   Height 08/09/21 1802 6\' 3"  (1.905  m)  ?   Head Circumference --   ?   Peak Flow --   ?   Pain Score 08/09/21 1801 0  ?   Pain Loc --   ?   Pain Edu? --   ?   Excl. in [place]? --   ?[id number]    ? _________________________________________ ?Significant initial  Findings: ?Abnormal Labs Reviewed  ?CBC - Abnormal; Notable for the following components:  ?    Result Value  ? RBC 3.69 (*)   ? Hemoglobin 12.1 (*)   ? HCT 36.3 (*)   ? All other components within normal limits  ?CBG MONITORING, ED - Abnormal; Notable for the following components:  ? Glucose-Capillary 111 (*)   ? All other components within normal limits  ?CBG MONITORING, ED - Abnormal; Notable for the following components:  ? Glucose-Capillary 62 (*)   ? All other components within normal limits  ? ?  ?ECG: Ordered ?Personally reviewed by me showing: ?HR : 76 ?Rhythm:Sinus rhythm ?Abnormal R-wave progression, early transition ?QTC 438 ?  ? ?The recent clinical data is shown below. ?Vitals:  ? 08/09/21  1802 08/09/21 1830 08/09/21 1845 08/09/21 1900  ?BP:  (!) 153/87 (!) 171/96 (!) 152/77  ?Pulse:  78 83 81  ?Resp:  14 (!) 9 17  ?Temp:      ?TempSrc:      ?SpO2:  97% 97% 93%  ?Weight: 134.3 kg     ?Height: 6\' 3"  (1.905 m)     ?  ?WBC ? ?   ?Component Value Date/Time  ? WBC 7.0 08/09/2021 1800  ? LYMPHSABS 1.4 08/09/2021 1800  ? MONOABS 0.7 08/09/2021 1800  ? EOSABS 0.2 08/09/2021 1800  ? BASOSABS 0.1 08/09/2021 1800  ?  ? ? UA ordered ?   ? ?Results for orders placed or performed during the hospital encounter of 06/10/21  ?Blood Culture (routine x 2)     Status: None  ? Collection Time: 06/10/21  5:42 AM  ? Specimen: BLOOD RIGHT HAND  ?Result Value Ref Range Status  ? Specimen Description BLOOD RIGHT HAND  Final  ? Special Requests   Final  ?  BOTTLES DRAWN AEROBIC AND ANAEROBIC Blood Culture adequate volume  ? Culture   Final  ?  NO GROWTH 5 DAYS ?Performed at [facility], [place] [address], [place], [place] [address] ?  ? Report Status 06/15/2021 FINAL  Final  ?Aerobic Culture w Gram Stain (superficial specimen)     Status: None  ? Collection Time: 06/10/21  7:59 AM  ? Specimen: Wound  ?Result Value Ref Range Status  ? Specimen Description WOUND  Final  ? Special Requests NONE  Final  ? Gram Stain   Final  ?  MODERATE WBC PRESENT, PREDOMINANTLY PMN ?RARE GRAM POSITIVE COCCI IN PAIRS ?Performed at [facility], [place] [address] [place],  [address] ?  ? Culture FEW STAPHYLOCOCCUS AUREUS  Final  ? Report Status 06/12/2021 FINAL  Final  ? Organism ID, Bacteria STAPHYLOCOCCUS AUREUS  Final  ?    Susceptibility  ? Staphylococcus aureus - MIC*  ?  CIPROFLOXACIN <=0.5 SENSITIVE Sensitive   ?  ERYTHROMYCIN <=0.25 SENSITIVE Sensitive   ?  GENTAMICIN <=0.5 SENSITIVE Sensitive   ?  OXACILLIN <=0.25 SENSITIVE Sensitive   ?  TETRACYCLINE <=1 SENSITIVE Sensitive   ?  VANCOMYCIN 1 SENSITIVE Sensitive   ?  TRIMETH/SULFA <=10 SENSITIVE Sensitive   ?  CLINDAMYCIN <=0.25 SENSITIVE Sensitive   ?  RIFAMPIN <=0.5  SENSITIVE Sensitive   ?  Inducible Clindamycin NEGATIVE Sensitive   ?  * FEW STAPHYLOCOCCUS AUREUS  ? ? ? ?_______________________________________________ ?Hospitalist was called for admission for   Slurred speech ?  ? ?Hypoglycemia ?  ? ? ? ?The following Work up has been ordered so far: ? ?Orders Placed This Encounter  ?Procedures  ? CT HEAD WO CONTRAST  ? DG Chest Port 1 View  ? CT ANGIO HEAD NECK W WO CM  ? CBC  ? Differential  ? Comprehensive metabolic panel  ? Protime-INR  ? APTT  ? Diet NPO time specified  ? Cardiac monitoring  ? Swallow screen  ? NIH Stroke Scale  ? Modified Stroke Scale (mNIHSS) Document mNIHSS assessment every 2 hours for a total of 12 hours  ? Saline Lock IV, Maintain IV access  ? If O2 sat  ? Consult to hospitalist  ? Pulse oximetry, continuous  ? I-stat chem 8, ED  ? CBG monitoring, ED  ? CBG monitoring, ED  ? ED EKG  ?  ? ?OTHER Significant initial  Findings: ? ?labs showing: ? ?  ?Recent Labs  ?Lab 08/09/21 ?1800  ?NA 137  ?K 4.1  ?CO2 27  ?GLUCOSE 85  ?BUN 16  ?CREATININE 1.15  ?CALCIUM 9.4  ? ? ?Cr    stable,    ?Lab Results  ?Component Value Date  ? CREATININE 1.15 08/09/2021  ? CREATININE 1.21 06/16/2021  ? CREATININE 1.04 06/15/2021  ? ? ?Recent Labs  ?Lab 08/09/21 ?1800  ?AST 21  ?ALT 12  ?ALKPHOS 75  ?BILITOT 0.9  ?PROT 7.6  ?ALBUMIN 3.9  ? ?Lab Results  ?Component Value Date  ? CALCIUM 9.4 08/09/2021  ? PHOS 4.2 12/11/2012  ?  ?Plt: ?Lab Results  ?Component Value Date  ? PLT 230 08/09/2021  ? ?   ?COVID-19 Labs ? ?No results for input(s): DDIMER, FERRITIN, LDH, CRP in the last 72 hours. ? ?Lab Results  ?Component Value Date  ? [place] NEGATIVE 01/20/2021  ?  ?   ?Recent Labs  ?Lab 08/09/21 ?1800  ?WBC 7.0  ?NEUTROABS 4.5  ?HGB 12.1*  ?HCT 36.3*  ?MCV 98.4  ?PLT 230  ? ? ?HG/HCT  stable,    ?   ?Component Value Date/Time  ? HGB 12.1 (L) 08/09/2021 1800  ? HCT 36.3 (L) 08/09/2021 1800  ? MCV 98.4 08/09/2021 1800  ? ?  ?Cardiac Panel (last 3 results) ?No results for input(s):  CKTOTAL, CKMB, TROPONINI, RELINDX in the last 72 hours. ? .car ?BNP (last 3 results) ?No results for input(s): BNP in the last 8760 hours. ?   ?DM  labs:  ?HbA1C: ?Recent Labs  ?  06/11/21 ?0130  ?HGBA1C 8

## 2021-08-09 NOTE — Consult Note (Signed)
Neurology Consultation ? ?Reason for Consult: Slurred speech ?Referring Physician: Dr. Shirlyn Goltz ? ?CC: Slurred speech ? ?History is obtained from: Patient, chart ? ?HPI: Thomas Frye is a 66 y.o. male past medical history of right thalamic, left parietal white matter strokes with no residual deficits, diabetes with complications such as neuropathy and prior history of DKA, sleep apnea, recent left BKA stump infection getting vancomycin through PICC line-presenting to the emergency room for evaluation of speech deficits. ?Reports going to bed normally on Friday but waking up on Saturday morning 08/07/2021 with slurred speech and sometimes a difficulty finding words.  Symptoms were not persistent but have not gone away completely with slurred speech remaining persistently is for today, which had him come to the emergency room for evaluation. ?Noncontrasted CT was unremarkable ?[facility] on-call neurologist called for advice over the phone-not in the window for IV or IA interventions and admission for stroke work-up recommended. ?CT angiography performed at [facility] while awaiting transfer to [facility] reviewed-no emergent LVO or hemodynamically significant stenosis in the head or neck. ? ? ?LKW: Sometime the night of 08/06/2021 ?tpa given?: no, outside the window ?Premorbid modified Rankin scale (mRS): 2- due to diabetes related complications and became ? ?ROS: Full ROS was performed and is negative except as noted in the HPI.  ? ?Past Medical History:  ?Diagnosis Date  ? ARF (acute renal failure) ([place]) 09/2015  ? Cerebrovascular disease   ? Right thalamic, left parietal white matter strokes  ? Depression   ? Diabetes mellitus   ? Diabetic peripheral neuropathy ([place])   ? DKA (diabetic ketoacidoses) 09/2015  ? Gait disturbance   ? GERD (gastroesophageal reflux disease)   ? H/O hiatal hernia   ? Left foot drop 10/05/2012  ? Neuromuscular disorder ([place])   ? neuropathy  ? Polyneuropathy in  diabetes(357.2) 10/05/2012  ? Sciatic neuropathy   ? Left-sided nerve  ? Sleep apnea   ? wears cpap at night  ? ? ? ?  ? ?Family History  ?Problem Relation Age of Onset  ? Breast cancer Mother   ? Melanoma Mother   ? Diabetes Mother   ? Diabetes Brother   ? Diabetes Maternal Grandmother   ? ? ? ?Social History:  ? reports that he has never smoked. He has never used smokeless tobacco. He reports that he does not currently use alcohol after a past usage of about 1.0 standard drink per week. He reports that he does not use drugs. ? ?Medications ?No current facility-administered medications for this encounter. ? ?Current Outpatient Medications:  ?  acetaminophen (TYLENOL) 325 MG tablet, Take 650 mg by mouth every 6 (six) hours as needed for moderate pain., Disp: , Rfl:  ?  amitriptyline (ELAVIL) 25 MG tablet, Take 25 mg by mouth at bedtime., Disp: , Rfl:  ?  amLODipine (NORVASC) 10 MG tablet, Take 1 tablet (10 mg total) by mouth daily., Disp: 30 tablet, Rfl: 2 ?  atorvastatin (LIPITOR) 10 MG tablet, Take 10 mg by mouth at bedtime., Disp: , Rfl:  ?  cephALEXin (KEFLEX) 500 MG capsule, Take 1 capsule (500 mg total) by mouth every 6 (six) hours., Disp: 26 capsule, Rfl: 0 ?  clopidogrel (PLAVIX) 75 MG tablet, Take 75 mg by mouth daily., Disp: , Rfl:  ?  Continuous Blood Gluc Receiver (FREESTYLE LIBRE 14 DAY READER) DEVI, by Does not apply route., Disp: , Rfl:  ?  doxazosin (CARDURA) 1 MG tablet, Take 0.5 tablets (0.5 mg  total) by mouth daily. (Patient not taking: Reported on 06/10/2021), Disp: 30 tablet, Rfl: 0 ?  ferrous sulfate 325 (65 FE) MG tablet, Take 325 mg by mouth daily with breakfast., Disp: , Rfl:  ?  insulin aspart (NOVOLOG) 100 UNIT/ML injection, CHECK YOUR CBG 3 TIMES A DAY WITH MEALS AND USE A SLIDING SCALE AS FOLLOWS: For CBG 121-150 inject 1unit of insulin For CBG 151-200 inject 2units of insulin For CBG 201-250 inject 3units of insulin  For CBG 251-300 inject 5units of insulin For CBG 301-350 inject 7units  of insulin For CBG 351-400 inject 9units of insulin For CBG >400 call your MD (Patient not taking: Reported on 06/10/2021), Disp: 1 vial, Rfl: 12 ?  insulin degludec (TRESIBA FLEXTOUCH) 100 UNIT/ML FlexTouch Pen, Inject 60 Units into the skin daily., Disp: , Rfl:  ?  Insulin Disposable Pump (OMNIPOD DASH PODS, GEN 4,) MISC, USE 1 POD EVERY 2 DAYS AS NEEDED, Disp: , Rfl:  ?  Insulin Disposable Pump (OMNIPOD DASH PODS, GEN 4,) MISC, Inject into the skin., Disp: , Rfl:  ?  insulin regular (NOVOLIN R) 100 units/mL injection,  follow insulin pump, Soln, Subcutaneous, As Indicated, 10 mL, 0 Refill(s), Route to Pharmacy Electronically, [facility], [place], 06/20/20 6:38:00 EDT, Height/Length Dosing, cm, 140.6, 06/20/20 6:38:00 EDT, Weight Dosing, kg, Disp: , Rfl:  ?  lisinopril (ZESTRIL) 20 MG tablet, Take 20 mg by mouth daily., Disp: , Rfl:  ?  loperamide (IMODIUM) 2 MG capsule, Take by mouth as needed for diarrhea or loose stools., Disp: , Rfl:  ?  metoCLOPramide (REGLAN) 5 MG tablet, Take 1 tablet (5 mg total) by mouth 4 (four) times daily. (Patient not taking: Reported on 06/10/2021), Disp: 120 tablet, Rfl: 0 ?  omeprazole (PRILOSEC) 40 MG capsule, Take 40 mg by mouth 2 (two) times daily., Disp: , Rfl:  ?  Pancrelipase, Lip-Prot-Amyl, 25000-79000 units CPEP, Take 1-2 capsules by mouth See admin instructions. 2 capsules  with meals  1 capsule with snacks, Disp: , Rfl:  ?  pramipexole (MIRAPEX) 0.125 MG tablet, Take 0.125 mg by mouth at bedtime., Disp: , Rfl:  ?  Probiotic Product (PROBIOTIC PO), Take 1 tablet by mouth daily., Disp: , Rfl:  ?  QUEtiapine (SEROQUEL) 50 MG tablet, Take 50 mg by mouth at bedtime., Disp: , Rfl:  ?  venlafaxine XR (EFFEXOR-XR) 150 MG 24 hr capsule, Take 150 mg by mouth daily with breakfast., Disp: , Rfl:  ?  vitamin E 1000 UNIT capsule, Take 1,000 Units by mouth in the morning and at bedtime., Disp: , Rfl:  ? ? ?Exam: ?Current vital signs: ?BP 133/83   Pulse 83   Temp 98.1 ?F  (36.7 ?C) (Oral)   Resp (!) 21   Ht 6\' 3"  (1.905 m)   Wt 134.3 kg   SpO2 95%   BMI 37.00 kg/m?  ?Vital signs in last 24 hours: ?Temp:  [98.1 ?F (36.7 ?C)] 98.1 ?F (36.7 ?C) (05/15 1801) ?Pulse Rate:  [78-84] 83 (05/15 2030) ?Resp:  [9-21] 21 (05/15 2030) ?BP: (133-171)/(50-96) 133/83 (05/15 2030) ?SpO2:  [93 %-97 %] 95 % (05/15 2030) ?Weight:  [134.3 kg] 134.3 kg (05/15 1802) ? ?GENERAL: Awake, alert in NAD ?HEENT: - Normocephalic and atraumatic, dry mm, no LN++, no Thyromegally ?LUNGS - Clear to auscultation bilaterally with no wheezes ?CV - S1S2 RRR, no m/r/g, equal pulses bilaterally. ?ABDOMEN - Soft, nontender, nondistended with normoactive BS ?Ext: Left BKA with stump and bandage ? ?NEURO:  ?Mental  Status: AA&Ox3  ?Language: speech is stuttering and dysarthric.  Naming, repetition, fluency, and comprehension intact. ?Cranial Nerves: PERRL EOMI, visual fields full, subtle flattening of the left nasolabial fold, facial sensation intact, hearing intact, tongue/uvula/soft palate midline, normal sternocleidomastoid and trapezius muscle strength. No evidence of tongue atrophy or fibrillations ?Motor: 5/5 without drift in bilateral upper extremities, right lower extremity and left hip ?Tone: is normal and bulk is normal ?Sensation- Intact to light touch bilaterally ?Coordination: FTN intact bilaterally ?Gait- deferred ? ?NIHSS-2 ? ? ?Labs ?I have reviewed labs in epic and the results pertinent to this consultation are: ? ?CBC ?   ?Component Value Date/Time  ? WBC 7.0 08/09/2021 1800  ? RBC 3.69 (L) 08/09/2021 1800  ? HGB 12.1 (L) 08/09/2021 1800  ? HCT 36.3 (L) 08/09/2021 1800  ? PLT 230 08/09/2021 1800  ? MCV 98.4 08/09/2021 1800  ? MCH 32.8 08/09/2021 1800  ? MCHC 33.3 08/09/2021 1800  ? RDW 13.2 08/09/2021 1800  ? LYMPHSABS 1.4 08/09/2021 1800  ? MONOABS 0.7 08/09/2021 1800  ? EOSABS 0.2 08/09/2021 1800  ? BASOSABS 0.1 08/09/2021 1800  ? ? ?CMP  ?   ?Component Value Date/Time  ? NA 137 08/09/2021 1800  ? K 4.1  08/09/2021 1800  ? CL 101 08/09/2021 1800  ? CO2 27 08/09/2021 1800  ? GLUCOSE 85 08/09/2021 1800  ? BUN 16 08/09/2021 1800  ? CREATININE 1.15 08/09/2021 1800  ? CALCIUM 9.4 08/09/2021 1800  ? PROT 7.6 08/09/2021 1

## 2021-08-09 NOTE — Assessment & Plan Note (Signed)
Given hypoglycemia will hold Insulin ?

## 2021-08-09 NOTE — Subjective & Objective (Signed)
Stroke like symptonms for 3 days has prior hx of CVA ? From SNF ?Has hx of left stumpt infection on vanc throught picc ?Left facial droop ?

## 2021-08-09 NOTE — Assessment & Plan Note (Addendum)
cbg q2 ?

## 2021-08-09 NOTE — ED Triage Notes (Signed)
Pt arrived via EMS from [facility]. Pt woke up Saturday 08/07/21 at 7am with slurred speech. Pt has had previous strokes with no defects. Pt reports dizziness on Friday before this happened. 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 Pt has hx of diabetes and is on an insulin pump. Pt has a picc line from his facility in his right bicep ?

## 2021-08-09 NOTE — ED Notes (Signed)
Light blue top recollected and sent to the lab. ?

## 2021-08-09 NOTE — Assessment & Plan Note (Signed)
Continue lipitor  ?

## 2021-08-09 NOTE — ED Provider Notes (Signed)
?[facility]-EMERGENCY DEPT ?Provider Note ? ? ?CSN: [id number] ?Arrival date & time: 08/09/21  1750 ? ?  ? ?History ? ?Chief Complaint  ?Patient presents with  ? Aphasia  ? ? ?Thomas Frye is a 66 y.o. male hx of stroke on plavix, hypertension, recent left stump infection vancomycin through PICC line here presenting with aphasia.  He states that he is in the nursing home currently. He states that for the last 3 days he has been having trouble speaking and left-sided facial droop.  He states that he did not come in right away because he was hoping that his symptoms will go away.  However they did not go away so he came here for further evaluation.  Patient had a stroke last year and was evaluated in [facility]. ? ?The history is provided by the patient.  ? ?  ? ?Home Medications ?Prior to Admission medications   ?Medication Sig Start Date End Date Taking? Authorizing Provider  ?acetaminophen (TYLENOL) 325 MG tablet Take 650 mg by mouth every 6 (six) hours as needed for moderate pain.    [provider]  ?amitriptyline (ELAVIL) 25 MG tablet Take 25 mg by mouth at bedtime.    [provider]  ?amLODipine (NORVASC) 10 MG tablet Take 1 tablet (10 mg total) by mouth daily. 06/17/21   Champ Mungoean, Emily, DO  ?atorvastatin (LIPITOR) 10 MG tablet Take 10 mg by mouth at bedtime.    [provider]  ?cephALEXin (KEFLEX) 500 MG capsule Take 1 capsule (500 mg total) by mouth every 6 (six) hours. 06/17/21   Champ Mungoean, Emily, DO  ?clopidogrel (PLAVIX) 75 MG tablet Take 75 mg by mouth daily.    [provider]  ?Continuous Blood Gluc Receiver (FREESTYLE LIBRE 14 DAY READER) DEVI by Does not apply route.    [provider]  ?doxazosin (CARDURA) 1 MG tablet Take 0.5 tablets (0.5 mg total) by mouth daily. ?Patient not taking: Reported on 06/10/2021 10/08/15   Joseph ArtVann, Jessica U, DO  ?ferrous sulfate 325 (65 FE) MG tablet Take 325 mg by mouth daily with breakfast.    [provider]  ?insulin aspart (NOVOLOG) 100 UNIT/ML injection CHECK YOUR CBG 3 TIMES A DAY WITH MEALS AND USE A SLIDING SCALE AS FOLLOWS: ?For CBG 121-150 inject 1unit of insulin ?For CBG 151-200 inject 2units of insulin ?For CBG 201-250 inject 3units of insulin  ?For CBG 251-300 inject 5units of insulin ?For CBG 301-350 inject 7units of insulin ?For CBG 351-400 inject 9units of insulin ?For CBG >400 call your MD ?Patient not taking: Reported on 06/10/2021 12/15/12   Lonia BloodMcClung, Jeffrey T, MD  ?insulin degludec (TRESIBA FLEXTOUCH) 100 UNIT/ML FlexTouch Pen Inject 60 Units into the skin daily.    [provider]  ?Insulin Disposable Pump (OMNIPOD DASH PODS, GEN 4,) MISC USE 1 POD EVERY 2 DAYS AS NEEDED 01/13/20   [provider]  ?Insulin Disposable Pump (OMNIPOD DASH PODS, GEN 4,) MISC Inject into the skin. 06/23/20   [provider]  ?insulin regular (NOVOLIN R) 100 units/mL injection  ?follow insulin pump, Soln, Subcutaneous, As Indicated, 10 mL, 0 Refill(s), [facility] [place] [place], 06/20/20 6:38:00 EDT, Height/Length Dosing, cm, 140.6, 06/20/20 6:38:00 EDT, Weight Dosing, kg 06/26/20   [provider]  ?lisinopril (ZESTRIL) 20 MG tablet Take 20 mg by mouth daily.    [provider]  ?loperamide (IMODIUM) 2 MG capsule Take by mouth as needed for diarrhea or  loose stools.    [provider]  ?metoCLOPramide (REGLAN) 5 MG tablet Take 1 tablet (5 mg total) by mouth 4 (four) times daily. ?Patient not taking: Reported on 06/10/2021 10/08/15   Joseph Art, DO  ?omeprazole (PRILOSEC) 40 MG capsule Take 40 mg by mouth 2 (two) times daily.    [provider]  ?Pancrelipase, Lip-Prot-Amyl, 25000-79000 units CPEP Take 1-2 capsules by mouth See admin instructions. 2 capsules  with meals  ?1 capsule with snacks    [provider]  ?pramipexole (MIRAPEX) 0.125 MG tablet Take 0.125 mg by mouth at bedtime.    [provider]  ?Probiotic Product (PROBIOTIC PO) Take 1 tablet by mouth daily.    [provider]  ?QUEtiapine (SEROQUEL) 50 MG tablet Take 50 mg by mouth at bedtime. 06/09/21   [provider]  ?venlafaxine XR (EFFEXOR-XR) 150 MG 24 hr capsule Take 150 mg by mouth daily with breakfast.    [provider]  ?vitamin E 1000 UNIT capsule Take 1,000 Units by mouth in the morning and at bedtime.    [provider]  ?   ? ?Allergies    ?Latex and Sulfa antibiotics   ? ?Review of Systems   ?Review of Systems  ?Neurological:  Positive for speech difficulty.  ?All other systems reviewed and are negative. ? ?Physical Exam ?Updated Vital Signs ?BP (!) 152/77   Pulse 81   Temp 98.1 ?F (36.7 ?C) (Oral)   Resp 17   Ht 6\' 3"  (1.905 m)   Wt 134.3 kg   SpO2 93%   BMI 37.00 kg/m?  ?Physical Exam ?Vitals and nursing note reviewed.  ?HENT:  ?   Head: Normocephalic.  ?   Nose: Nose normal.  ?   Mouth/Throat:  ?   Mouth: Mucous membranes are dry.  ?Eyes:  ?   Extraocular Movements: Extraocular movements intact.  ?   Pupils: Pupils are equal, round, and reactive to light.  ?Cardiovascular:  ?   Rate and Rhythm: Normal rate and regular rhythm.  ?   Heart sounds: Normal heart sounds.  ?Pulmonary:  ?   Effort: Pulmonary effort is normal.  ?   Breath sounds: Normal breath sounds.  ?Abdominal:  ?   General: Abdomen is flat.  ?   Palpations: Abdomen is soft.  ?Musculoskeletal:     ?   General: Normal range of motion.  ?   Cervical back: Normal range of motion.  ?   Comments: Patient has left BKA and there is some erythema at the stump site but no purulent drainage  ?Skin: ?   General: Skin is warm.  ?   Capillary Refill: Capillary refill takes less than 2 seconds.  ?Neurological:  ?   Mental Status: He is alert.  ?   Comments: Slurred speech, left facial droop.  Patient has normal strength bilateral arms.  ? ? ?ED Results / Procedures / Treatments   ?Labs ?(all labs ordered are listed, but only  abnormal results are displayed) ?Labs Reviewed  ?CBC - Abnormal; Notable for the following components:  ?    Result Value  ? RBC 3.69 (*)   ? Hemoglobin 12.1 (*)   ? HCT 36.3 (*)   ? All other components within normal limits  ?CBG MONITORING, ED - Abnormal; Notable for the following components:  ? Glucose-Capillary 111 (*)   ? All other components within normal limits  ?CBG MONITORING, ED - Abnormal; Notable for the following components:  ?  Glucose-Capillary 62 (*)   ? All other components within normal limits  ?DIFFERENTIAL  ?COMPREHENSIVE METABOLIC PANEL  ?PROTIME-INR  ?APTT  ?I-STAT CHEM 8, ED  ? ? ?EKG ?None ? ?Radiology ?CT ANGIO HEAD NECK W WO CM ? ?Result Date: 08/09/2021 ?CLINICAL DATA:  TIA.  Slurred speech. EXAM: CT ANGIOGRAPHY HEAD AND NECK TECHNIQUE: Multidetector CT imaging of the head and neck was performed using the standard protocol during bolus administration of intravenous contrast. Multiplanar CT image reconstructions and MIPs were obtained to evaluate the vascular anatomy. Carotid stenosis measurements (when applicable) are obtained utilizing NASCET criteria, using the distal internal carotid diameter as the denominator. RADIATION DOSE REDUCTION: This exam was performed according to the departmental dose-optimization program which includes automated exposure control, adjustment of the mA and/or kV according to patient size and/or use of iterative reconstruction technique. CONTRAST:  71mL OMNIPAQUE IOHEXOL 350 MG/ML SOLN COMPARISON:  None Available. FINDINGS: CT HEAD FINDINGS Brain: There is no mass, hemorrhage or extra-axial collection. There is generalized atrophy without lobar predilection. There are old bilateral frontal infarcts. There is hypoattenuation of the periventricular white matter, most commonly indicating chronic ischemic microangiopathy. Skull: The visualized skull base, calvarium and extracranial soft tissues are normal. Sinuses/Orbits: No fluid levels or advanced mucosal  thickening of the visualized paranasal sinuses. No mastoid or middle ear effusion. The orbits are normal. CTA NECK FINDINGS SKELETON: There is no bony spinal canal stenosis. No lytic or blastic lesion. OTHER NECK: Normal pharynx, la

## 2021-08-09 NOTE — Assessment & Plan Note (Signed)
Continue PPI ?

## 2021-08-09 NOTE — Assessment & Plan Note (Addendum)
Per neurology ok to restart home meds ?

## 2021-08-09 NOTE — Assessment & Plan Note (Addendum)
On vancomycin per pharmacy will need a level prior to restart ?

## 2021-08-09 NOTE — Assessment & Plan Note (Addendum)
Hold long lasting  insulin given hypoglycemia ?Very sensitive SSI as needed  ?If improves once start eating may need to restart lowed dose long lasting ?Diabetes consult ?

## 2021-08-10 ENCOUNTER — Observation Stay (HOSPITAL_COMMUNITY): Payer: Medicare Other

## 2021-08-10 DIAGNOSIS — I5032 Chronic diastolic (congestive) heart failure: Secondary | ICD-10-CM | POA: Diagnosis present

## 2021-08-10 DIAGNOSIS — E1051 Type 1 diabetes mellitus with diabetic peripheral angiopathy without gangrene: Secondary | ICD-10-CM | POA: Diagnosis present

## 2021-08-10 DIAGNOSIS — R471 Dysarthria and anarthria: Secondary | ICD-10-CM | POA: Diagnosis present

## 2021-08-10 DIAGNOSIS — Z91040 Latex allergy status: Secondary | ICD-10-CM | POA: Diagnosis not present

## 2021-08-10 DIAGNOSIS — I6340 Cerebral infarction due to embolism of unspecified cerebral artery: Secondary | ICD-10-CM | POA: Diagnosis present

## 2021-08-10 DIAGNOSIS — Y835 Amputation of limb(s) as the cause of abnormal reaction of the patient, or of later complication, without mention of misadventure at the time of the procedure: Secondary | ICD-10-CM | POA: Diagnosis present

## 2021-08-10 DIAGNOSIS — R4781 Slurred speech: Secondary | ICD-10-CM | POA: Diagnosis present

## 2021-08-10 DIAGNOSIS — F32A Depression, unspecified: Secondary | ICD-10-CM | POA: Diagnosis present

## 2021-08-10 DIAGNOSIS — R29818 Other symptoms and signs involving the nervous system: Secondary | ICD-10-CM | POA: Diagnosis not present

## 2021-08-10 DIAGNOSIS — K8689 Other specified diseases of pancreas: Secondary | ICD-10-CM | POA: Diagnosis present

## 2021-08-10 DIAGNOSIS — G2581 Restless legs syndrome: Secondary | ICD-10-CM | POA: Diagnosis present

## 2021-08-10 DIAGNOSIS — E7800 Pure hypercholesterolemia, unspecified: Secondary | ICD-10-CM

## 2021-08-10 DIAGNOSIS — E669 Obesity, unspecified: Secondary | ICD-10-CM | POA: Diagnosis present

## 2021-08-10 DIAGNOSIS — K219 Gastro-esophageal reflux disease without esophagitis: Secondary | ICD-10-CM | POA: Diagnosis present

## 2021-08-10 DIAGNOSIS — E1151 Type 2 diabetes mellitus with diabetic peripheral angiopathy without gangrene: Secondary | ICD-10-CM | POA: Diagnosis not present

## 2021-08-10 DIAGNOSIS — I10 Essential (primary) hypertension: Secondary | ICD-10-CM

## 2021-08-10 DIAGNOSIS — I110 Hypertensive heart disease with heart failure: Secondary | ICD-10-CM | POA: Diagnosis present

## 2021-08-10 DIAGNOSIS — Z79899 Other long term (current) drug therapy: Secondary | ICD-10-CM | POA: Diagnosis not present

## 2021-08-10 DIAGNOSIS — Z881 Allergy status to other antibiotic agents status: Secondary | ICD-10-CM | POA: Diagnosis not present

## 2021-08-10 DIAGNOSIS — I639 Cerebral infarction, unspecified: Secondary | ICD-10-CM | POA: Diagnosis present

## 2021-08-10 DIAGNOSIS — E1065 Type 1 diabetes mellitus with hyperglycemia: Secondary | ICD-10-CM | POA: Diagnosis present

## 2021-08-10 DIAGNOSIS — T8744 Infection of amputation stump, left lower extremity: Secondary | ICD-10-CM

## 2021-08-10 DIAGNOSIS — R4701 Aphasia: Secondary | ICD-10-CM | POA: Diagnosis present

## 2021-08-10 DIAGNOSIS — R29702 NIHSS score 2: Secondary | ICD-10-CM | POA: Diagnosis present

## 2021-08-10 DIAGNOSIS — Z8673 Personal history of transient ischemic attack (TIA), and cerebral infarction without residual deficits: Secondary | ICD-10-CM

## 2021-08-10 DIAGNOSIS — I6389 Other cerebral infarction: Secondary | ICD-10-CM | POA: Diagnosis not present

## 2021-08-10 DIAGNOSIS — Z6837 Body mass index (BMI) 37.0-37.9, adult: Secondary | ICD-10-CM | POA: Diagnosis not present

## 2021-08-10 DIAGNOSIS — E785 Hyperlipidemia, unspecified: Secondary | ICD-10-CM | POA: Diagnosis present

## 2021-08-10 DIAGNOSIS — E10649 Type 1 diabetes mellitus with hypoglycemia without coma: Secondary | ICD-10-CM | POA: Diagnosis present

## 2021-08-10 DIAGNOSIS — E1042 Type 1 diabetes mellitus with diabetic polyneuropathy: Secondary | ICD-10-CM | POA: Diagnosis present

## 2021-08-10 DIAGNOSIS — R2990 Unspecified symptoms and signs involving the nervous system: Secondary | ICD-10-CM | POA: Diagnosis present

## 2021-08-10 DIAGNOSIS — D631 Anemia in chronic kidney disease: Secondary | ICD-10-CM | POA: Diagnosis present

## 2021-08-10 DIAGNOSIS — R29810 Facial weakness: Secondary | ICD-10-CM | POA: Diagnosis present

## 2021-08-10 LAB — ECHOCARDIOGRAM COMPLETE
AR max vel: 2.84 cm2
AV Peak grad: 7.5 mmHg
Ao pk vel: 1.37 m/s
Area-P 1/2: 6.12 cm2
Height: 75 in
S' Lateral: 2.8 cm
Weight: 4736 oz

## 2021-08-10 LAB — COMPREHENSIVE METABOLIC PANEL
ALT: 10 U/L (ref 0–44)
AST: 14 U/L — ABNORMAL LOW (ref 15–41)
Albumin: 3.6 g/dL (ref 3.5–5.0)
Alkaline Phosphatase: 70 U/L (ref 38–126)
Anion gap: 9 (ref 5–15)
BUN: 14 mg/dL (ref 8–23)
CO2: 27 mmol/L (ref 22–32)
Calcium: 9.3 mg/dL (ref 8.9–10.3)
Chloride: 102 mmol/L (ref 98–111)
Creatinine, Ser: 0.87 mg/dL (ref 0.61–1.24)
GFR, Estimated: >60 mL/min (ref >60)
Glucose, Bld: 165 mg/dL — ABNORMAL HIGH (ref 70–99)
Potassium: 3.7 mmol/L (ref 3.5–5.1)
Sodium: 138 mmol/L (ref 135–145)
Total Bilirubin: 0.5 mg/dL (ref 0.3–1.2)
Total Protein: 6.7 g/dL (ref 6.5–8.1)

## 2021-08-10 LAB — LIPID PANEL
Cholesterol: 129 mg/dL (ref 0–200)
HDL: 49 mg/dL (ref >40)
LDL Cholesterol: 65 mg/dL (ref 0–99)
Total CHOL/HDL Ratio: 2.6 RATIO
Triglycerides: 77 mg/dL (ref <150)
VLDL: 15 mg/dL (ref 0–40)

## 2021-08-10 LAB — GLUCOSE, CAPILLARY
Glucose-Capillary: 182 mg/dL — ABNORMAL HIGH (ref 70–99)
Glucose-Capillary: 204 mg/dL — ABNORMAL HIGH (ref 70–99)
Glucose-Capillary: 207 mg/dL — ABNORMAL HIGH (ref 70–99)
Glucose-Capillary: 222 mg/dL — ABNORMAL HIGH (ref 70–99)

## 2021-08-10 LAB — RAPID URINE DRUG SCREEN, HOSP PERFORMED
Amphetamines: NOT DETECTED
Barbiturates: NOT DETECTED
Benzodiazepines: NOT DETECTED
Cocaine: NOT DETECTED
Opiates: NOT DETECTED
Tetrahydrocannabinol: NOT DETECTED

## 2021-08-10 LAB — URINALYSIS, COMPLETE (UACMP) WITH MICROSCOPIC
Bacteria, UA: NONE SEEN
Bilirubin Urine: NEGATIVE
Glucose, UA: 50 mg/dL — AB
Hgb urine dipstick: NEGATIVE
Ketones, ur: NEGATIVE mg/dL
Nitrite: NEGATIVE
Protein, ur: NEGATIVE mg/dL
Specific Gravity, Urine: 1.019 (ref 1.005–1.030)
pH: 6 (ref 5.0–8.0)

## 2021-08-10 LAB — CBG MONITORING, ED
Glucose-Capillary: 112 mg/dL — ABNORMAL HIGH (ref 70–99)
Glucose-Capillary: 156 mg/dL — ABNORMAL HIGH (ref 70–99)
Glucose-Capillary: 176 mg/dL — ABNORMAL HIGH (ref 70–99)
Glucose-Capillary: 206 mg/dL — ABNORMAL HIGH (ref 70–99)

## 2021-08-10 LAB — CBC
HCT: 36.7 % — ABNORMAL LOW (ref 39.0–52.0)
Hemoglobin: 11.9 g/dL — ABNORMAL LOW (ref 13.0–17.0)
MCH: 32.1 pg (ref 26.0–34.0)
MCHC: 32.4 g/dL (ref 30.0–36.0)
MCV: 98.9 fL (ref 80.0–100.0)
Platelets: 219 10*3/uL (ref 150–400)
RBC: 3.71 MIL/uL — ABNORMAL LOW (ref 4.22–5.81)
RDW: 13.1 % (ref 11.5–15.5)
WBC: 7.7 10*3/uL (ref 4.0–10.5)
nRBC: 0 % (ref 0.0–0.2)

## 2021-08-10 LAB — PREALBUMIN: Prealbumin: 15.4 mg/dL — ABNORMAL LOW (ref 18–38)

## 2021-08-10 MED ORDER — INSULIN ASPART 100 UNIT/ML IJ SOLN
0.0000 [IU] | INTRAMUSCULAR | Status: DC
  Administered 2021-08-10: 1 [IU] via SUBCUTANEOUS
  Administered 2021-08-10 (×3): 2 [IU] via SUBCUTANEOUS
  Administered 2021-08-10: 1 [IU] via SUBCUTANEOUS
  Administered 2021-08-11 (×2): 2 [IU] via SUBCUTANEOUS
  Administered 2021-08-11: 5 [IU] via SUBCUTANEOUS
  Administered 2021-08-11 (×2): 1 [IU] via SUBCUTANEOUS
  Administered 2021-08-12: 5 [IU] via SUBCUTANEOUS
  Administered 2021-08-12: 4 [IU] via SUBCUTANEOUS
  Administered 2021-08-12 (×2): 3 [IU] via SUBCUTANEOUS
  Administered 2021-08-12: 4 [IU] via SUBCUTANEOUS
  Administered 2021-08-12: 5 [IU] via SUBCUTANEOUS
  Administered 2021-08-12 – 2021-08-13 (×3): 3 [IU] via SUBCUTANEOUS
  Administered 2021-08-13: 2 [IU] via SUBCUTANEOUS
  Filled 2021-08-10: qty 0.06

## 2021-08-10 MED ORDER — FERROUS SULFATE 325 (65 FE) MG PO TABS
325.0000 mg | ORAL_TABLET | Freq: Every day | ORAL | Status: DC
  Administered 2021-08-10 – 2021-08-13 (×3): 325 mg via ORAL
  Filled 2021-08-10 (×3): qty 1

## 2021-08-10 MED ORDER — QUETIAPINE FUMARATE 50 MG PO TABS
50.0000 mg | ORAL_TABLET | Freq: Every day | ORAL | Status: DC
  Administered 2021-08-10 – 2021-08-12 (×3): 50 mg via ORAL
  Filled 2021-08-10 (×4): qty 1

## 2021-08-10 MED ORDER — ACETAMINOPHEN 650 MG RE SUPP: 650.0000 mg | RECTAL | Status: DC | PRN

## 2021-08-10 MED ORDER — CLOPIDOGREL BISULFATE 75 MG PO TABS
75.0000 mg | ORAL_TABLET | Freq: Every day | ORAL | Status: DC
  Administered 2021-08-10: 75 mg via ORAL
  Filled 2021-08-10: qty 1

## 2021-08-10 MED ORDER — PANCRELIPASE (LIP-PROT-AMYL) 12000-38000 UNITS PO CPEP: 12000.0000 [IU] | ORAL_CAPSULE | Freq: Every day | ORAL | Status: DC | PRN

## 2021-08-10 MED ORDER — TICAGRELOR 90 MG PO TABS
90.0000 mg | ORAL_TABLET | Freq: Two times a day (BID) | ORAL | Status: DC
  Administered 2021-08-11 – 2021-08-13 (×5): 90 mg via ORAL
  Filled 2021-08-10 (×6): qty 1

## 2021-08-10 MED ORDER — ATORVASTATIN CALCIUM 40 MG PO TABS
40.0000 mg | ORAL_TABLET | Freq: Every day | ORAL | Status: DC
Start: 2021-08-11 — End: 2021-08-13
  Administered 2021-08-11 – 2021-08-12 (×2): 40 mg via ORAL
  Filled 2021-08-10 (×2): qty 1

## 2021-08-10 MED ORDER — VANCOMYCIN HCL IN DEXTROSE 1-5 GM/200ML-% IV SOLN
1000.0000 mg | Freq: Once | INTRAVENOUS | Status: AC
  Administered 2021-08-10: 1000 mg via INTRAVENOUS
  Filled 2021-08-10: qty 200

## 2021-08-10 MED ORDER — LISINOPRIL 20 MG PO TABS
20.0000 mg | ORAL_TABLET | Freq: Every day | ORAL | Status: DC
  Administered 2021-08-10 – 2021-08-13 (×4): 20 mg via ORAL
  Filled 2021-08-10 (×2): qty 2
  Filled 2021-08-10 (×3): qty 1

## 2021-08-10 MED ORDER — PRAMIPEXOLE DIHYDROCHLORIDE 0.125 MG PO TABS
0.1250 mg | ORAL_TABLET | Freq: Every day | ORAL | Status: DC
  Administered 2021-08-10 – 2021-08-12 (×3): 0.125 mg via ORAL
  Filled 2021-08-10 (×5): qty 1

## 2021-08-10 MED ORDER — ASPIRIN 81 MG PO TBEC
81.0000 mg | DELAYED_RELEASE_TABLET | Freq: Every day | ORAL | Status: DC
  Administered 2021-08-10 – 2021-08-13 (×4): 81 mg via ORAL
  Filled 2021-08-10 (×4): qty 1

## 2021-08-10 MED ORDER — ACETAMINOPHEN 160 MG/5ML PO SOLN: 650.0000 mg | ORAL | Status: DC | PRN

## 2021-08-10 MED ORDER — PANCRELIPASE (LIP-PROT-AMYL) 12000-38000 UNITS PO CPEP
12000.0000 [IU] | ORAL_CAPSULE | Freq: Three times a day (TID) | ORAL | Status: DC
  Administered 2021-08-11: 12000 [IU] via ORAL
  Filled 2021-08-10 (×2): qty 1

## 2021-08-10 MED ORDER — PANTOPRAZOLE SODIUM 40 MG PO TBEC
40.0000 mg | DELAYED_RELEASE_TABLET | Freq: Every day | ORAL | Status: DC
  Administered 2021-08-10 – 2021-08-13 (×4): 40 mg via ORAL
  Filled 2021-08-10 (×4): qty 1

## 2021-08-10 MED ORDER — VANCOMYCIN HCL IN DEXTROSE 1-5 GM/200ML-% IV SOLN
1000.0000 mg | Freq: Two times a day (BID) | INTRAVENOUS | Status: AC
  Administered 2021-08-10 – 2021-08-12 (×6): 1000 mg via INTRAVENOUS
  Filled 2021-08-10 (×6): qty 200

## 2021-08-10 MED ORDER — PANCRELIPASE (LIP-PROT-AMYL) 4200-14200 UNITS PO CPEP: 2.0000 | ORAL_CAPSULE | Freq: Three times a day (TID) | ORAL | Status: DC

## 2021-08-10 MED ORDER — ATORVASTATIN CALCIUM 40 MG PO TABS
80.0000 mg | ORAL_TABLET | Freq: Every day | ORAL | Status: DC
  Administered 2021-08-10: 80 mg via ORAL
  Filled 2021-08-10 (×2): qty 2

## 2021-08-10 MED ORDER — SODIUM CHLORIDE 0.9 % IV SOLN: INTRAVENOUS | Status: DC

## 2021-08-10 MED ORDER — STROKE: EARLY STAGES OF RECOVERY BOOK
Freq: Once | Status: DC
  Filled 2021-08-10: qty 1

## 2021-08-10 MED ORDER — VENLAFAXINE HCL ER 150 MG PO CP24
150.0000 mg | ORAL_CAPSULE | Freq: Every day | ORAL | Status: DC
  Administered 2021-08-10 – 2021-08-13 (×3): 150 mg via ORAL
  Filled 2021-08-10: qty 1
  Filled 2021-08-10: qty 2
  Filled 2021-08-10: qty 1

## 2021-08-10 MED ORDER — ACETAMINOPHEN 325 MG PO TABS: 650.0000 mg | ORAL_TABLET | ORAL | Status: DC | PRN

## 2021-08-10 NOTE — Progress Notes (Signed)
Bilateral lower extremity venous duplex has been completed. ?Preliminary results can be found in CV Proc through chart review.  ? ?08/10/21 1:24 PM ?Olen Cordial RVT   ?

## 2021-08-10 NOTE — Evaluation (Signed)
Occupational Therapy Evaluation ?Patient Details ?Name: Thomas Frye ?MRN: [id number] ?DOB: 06-27-1955 ?Today's Date: 08/10/2021 ? ? ?History of Present Illness Thomas Frye is a 66 y.o. male with medical history significant of prior CVA , HTN  HLD, DM2, sp Left bKA with residual limb  infection ,sp Loop monitor presents to Ed 08/09/21 from SNF with slurred speech and some word finding difficulties. MRI results:1. Small acute infarct of the right precentral gyrus. No hemorrhage  or mass effect.  2. Multiple old infarcts and findings of chronic ischemic  microangiopathy.  ? ?Clinical Impression ?  ?Patient is a 66 year old male who was admitted for above.patient had been at SNF for short term rehab per patient report prior to presenting to the hospital.  Patient was noted to have some difficulty with describing medical timeline for prosthesis progress during session.patient was noted to have BP of 152/83 mmhg long sitting in bed.  Patient was noted to have decreased functional activity tolerance,decreased endurance,decreased safety awareness,and  increased difficulty with word finding impacting participation in ADLs. Patient would continue to benefit from skilled OT services at this time while admitted and after d/c to address noted deficits in order to improve overall safety and independence in ADLs.  ? ?   ? ?Recommendations for follow up therapy are one component of a multi-disciplinary discharge planning process, led by the attending physician.  Recommendations may be updated based on patient status, additional functional criteria and insurance authorization.  ? ?Follow Up Recommendations ? Skilled nursing-short term rehab (<3 hours/day)  ?  ?Assistance Recommended at Discharge Frequent or constant Supervision/Assistance  ?Patient can return home with the following A lot of help with walking and/or transfers;A lot of help with bathing/dressing/bathroom;Direct supervision/assist for medications  management;Direct supervision/assist for financial management;Assist for transportation;Help with stairs or ramp for entrance ? ?  ?Functional Status Assessment ? Patient has had a recent decline in their functional status and demonstrates the ability to make significant improvements in function in a reasonable and predictable amount of time.  ?Equipment Recommendations ? Other (comment) (defer to next venue)  ?  ?Recommendations for Other Services   ? ? ?  ?Precautions / Restrictions Precautions ?Precautions: Fall ?Precaution Comments: L BKA, monitor vitals,  ? ?  ? ?Mobility Bed Mobility ?Overal bed mobility: Needs Assistance ?Bed Mobility: Supine to Sit ?  ?  ?Supine to sit: Min assist ?  ?  ?General bed mobility comments: with increasec time in stretcher with use of bilateral bed rails. ?  ? ?Transfers ?  ?  ?  ?  ?  ?  ?  ?  ?  ?  ?  ? ?  ?Balance Overall balance assessment: Mild deficits observed, not formally tested ?  ?  ?  ?  ?  ?  ?  ?  ?  ?  ?  ?  ?  ?  ?  ?  ?  ?  ?   ? ?ADL either performed or assessed with clinical judgement  ? ?ADL Overall ADL's : Needs assistance/impaired ?Eating/Feeding: NPO ?  ?Grooming: Min guard;Sitting ?Grooming Details (indicate cue type and reason): long sitting in bed ?Upper Body Bathing: Min guard;Sitting ?  ?Lower Body Bathing: Maximal assistance;Bed level ?  ?Upper Body Dressing : Min guard;Sitting ?  ?Lower Body Dressing: Bed level;Maximal assistance ?  ?Toilet Transfer: +2 for physical assistance;+2 for safety/equipment ?Toilet Transfer Details (indicate cue type and reason): patient able to long sit in bed during session. L  BKA ?Toileting- Clothing Manipulation and Hygiene: Maximal assistance;Bed level ?  ?  ?  ?  ?   ? ? ? ?Vision Patient Visual Report: No change from baseline ?Vision Assessment?: No apparent visual deficits  ?   ?Perception   ?  ?Praxis   ?  ? ?Pertinent Vitals/Pain Pain Assessment ?Pain Assessment: No/denies pain  ? ? ? ?Hand Dominance Left ?   ?Extremity/Trunk Assessment Upper Extremity Assessment ?Upper Extremity Assessment: Overall WFL for tasks assessed ?  ?Lower Extremity Assessment ?Lower Extremity Assessment: Defer to PT evaluation ?  ?Cervical / Trunk Assessment ?Cervical / Trunk Assessment: Kyphotic ?  ?Communication Communication ?Communication: Expressive difficulties (noted speech slurred,) ?  ?Cognition Arousal/Alertness: Awake/alert ?Behavior During Therapy: Flat affect ?Overall Cognitive Status: Within Functional Limits for tasks assessed ?  ?  ?  ?  ?  ?  ?  ?  ?  ?  ?  ?  ?  ?  ?  ?  ?General Comments: patient reported feeling tired this AM. patient was able to follow commands. patient was oriented to self and hospital. patient was within 1 day of day of week and date of today. month and year were accurate. ?  ?  ?General Comments    ? ?  ?Exercises   ?  ?Shoulder Instructions    ? ? ?Home Living Family/patient expects to be discharged to:: Skilled nursing facility ?  ?  ?  ?  ?  ?  ?  ?  ?  ?  ?  ?  ?  ?  ?  ?  ?  ?  ? ?  ?Prior Functioning/Environment Prior Level of Function : Independent/Modified Independent ?  ?  ?  ?  ?  ?  ?Mobility Comments: patient reported he was able to transfer himself at wheelchair level from bed to bathroom and wheelchair. patient reporting waiting to work on prostesis prior to being able to transition home. ?ADLs Comments: was independent with ADLs at wheelchair level at SNF ?  ? ?  ?  ?OT Problem List: Impaired balance (sitting and/or standing);Decreased knowledge of precautions;Decreased safety awareness;Decreased knowledge of use of DME or AE;Cardiopulmonary status limiting activity ?  ?   ?OT Treatment/Interventions: Self-care/ADL training;Therapeutic exercise;Neuromuscular education;Energy conservation;DME and/or AE instruction;Therapeutic activities;Patient/family education;Balance training  ?  ?OT Goals(Current goals can be found in the care plan section) Acute Rehab OT Goals ?Patient Stated Goal: to  get back to working with prostesis ?OT Goal Formulation: With patient ?Time For Goal Achievement: 08/24/21 ?Potential to Achieve Goals: Good  ?OT Frequency: Min 2X/week ?  ? ?Co-evaluation PT/OT/SLP Co-Evaluation/Treatment: Yes ?Reason for Co-Treatment: For patient/therapist safety ?PT goals addressed during session: Mobility/safety with mobility ?OT goals addressed during session: ADL's and self-care ?  ? ?  ?AM-PAC OT "6 Clicks" Daily Activity     ?Outcome Measure Help from another person eating meals?: Total (NPO) ?Help from another person taking care of personal grooming?: A Little ?Help from another person toileting, which includes using toliet, bedpan, or urinal?: Total ?Help from another person bathing (including washing, rinsing, drying)?: A Lot ?Help from another person to put on and taking off regular upper body clothing?: A Little ?Help from another person to put on and taking off regular lower body clothing?: A Lot ?6 Click Score: 12 ?  ?End of Session   ? ?Activity Tolerance: Patient tolerated treatment well ?Patient left: in bed;with call bell/phone within reach ? ?OT Visit Diagnosis: Other abnormalities of gait and mobility (R26.89);Muscle  weakness (generalized) (M62.81)  ?              ?Time: [id number] ?OT Time Calculation (min): 17 min ?Charges:  OT General Charges ?$OT Visit: 1 Visit ?OT Evaluation ?$OT Eval Moderate Complexity: 1 Mod ? ?Rahcel Shutes OTR/L, MS ?Acute Rehabilitation Department ?Office# [phone] ?Pager# [phone] ? ? ?[place] ?08/10/2021, 10:11 AM ?

## 2021-08-10 NOTE — Progress Notes (Signed)
STROKE TEAM PROGRESS NOTE  ? ?SUBJECTIVE (INTERVAL HISTORY) ?No family is at the bedside.  Overall his condition is stable. Pt still has dysarthria and right facial droop, likely acute on chronic. He had stroke in 07/2020 and TIA in 11/2020 and treated in [place]. Had TEE and loop recorder done in [facility]. Will need to interrogate the loop in am.  ? ? ?OBJECTIVE ?Temp:  [97.8 ?F (36.6 ?C)-98.2 ?F (36.8 ?C)] 97.8 ?F (36.6 ?C) (05/16 1814) ?Pulse Rate:  [75-107] 90 (05/16 1814) ?Cardiac Rhythm: Normal sinus rhythm (05/16 1239) ?Resp:  [10-24] 16 (05/16 1814) ?BP: (133-197)/(82-106) 171/97 (05/16 1814) ?SpO2:  [89 %-99 %] 94 % (05/16 1814) ? ?Recent Labs  ?Lab 08/10/21 ?0233 08/10/21 ?0413 08/10/21 ?6301 08/10/21 ?1241 08/10/21 ?1655  ?GLUCAP 156* 176* 206* 222* 204*  ? ?Recent Labs  ?Lab 08/09/21 ?1800 08/09/21 ?2146 08/10/21 ?0500  ?NA 137  --  138  ?K 4.1  --  3.7  ?CL 101  --  102  ?CO2 27  --  27  ?GLUCOSE 85  --  165*  ?BUN 16  --  14  ?CREATININE 1.15  --  0.87  ?CALCIUM 9.4  --  9.3  ?MG  --  2.3  --   ?PHOS  --  3.1  --   ? ?Recent Labs  ?Lab 08/09/21 ?1800 08/10/21 ?0500  ?AST 21 14*  ?ALT 12 10  ?ALKPHOS 75 70  ?BILITOT 0.9 0.5  ?PROT 7.6 6.7  ?ALBUMIN 3.9 3.6  ? ?Recent Labs  ?Lab 08/09/21 ?1800 08/10/21 ?0500  ?WBC 7.0 7.7  ?NEUTROABS 4.5  --   ?HGB 12.1* 11.9*  ?HCT 36.3* 36.7*  ?MCV 98.4 98.9  ?PLT 230 219  ? ?No results for input(s): CKTOTAL, CKMB, CKMBINDEX, TROPONINI in the last 168 hours. ?Recent Labs  ?  08/09/21 ?1920  ?LABPROT 12.5  ?INR 0.9  ? ?Recent Labs  ?  08/10/21 ?0505  ?COLORURINE STRAW*  ?LABSPEC 1.019  ?PHURINE 6.0  ?GLUCOSEU 50*  ?HGBUR NEGATIVE  ?BILIRUBINUR NEGATIVE  ?KETONESUR NEGATIVE  ?PROTEINUR NEGATIVE  ?NITRITE NEGATIVE  ?LEUKOCYTESUR TRACE*  ?  ?   ?Component Value Date/Time  ? CHOL 129 08/10/2021 0500  ? TRIG 77 08/10/2021 0500  ? HDL 49 08/10/2021 0500  ? CHOLHDL 2.6 08/10/2021 0500  ? VLDL 15 08/10/2021 0500  ? LDLCALC 65 08/10/2021 0500  ? ?Lab Results  ?Component Value Date  ?  HGBA1C 8.0 (H) 06/11/2021  ? ?   ?Component Value Date/Time  ? LABOPIA NONE DETECTED 08/10/2021 0505  ? COCAINSCRNUR NONE DETECTED 08/10/2021 0505  ? LABBENZ NONE DETECTED 08/10/2021 0505  ? AMPHETMU NONE DETECTED 08/10/2021 0505  ? THCU NONE DETECTED 08/10/2021 0505  ? LABBARB NONE DETECTED 08/10/2021 0505  ?  ?No results for input(s): ETH in the last 168 hours. ? ?I have personally reviewed the radiological images below and agree with the radiology interpretations. ? ?CT ANGIO HEAD NECK W WO CM ? ?Result Date: 08/09/2021 ?CLINICAL DATA:  TIA.  Slurred speech. EXAM: CT ANGIOGRAPHY HEAD AND NECK TECHNIQUE: Multidetector CT imaging of the head and neck was performed using the standard protocol during bolus administration of intravenous contrast. Multiplanar CT image reconstructions and MIPs were obtained to evaluate the vascular anatomy. Carotid stenosis measurements (when applicable) are obtained utilizing NASCET criteria, using the distal internal carotid diameter as the denominator. RADIATION DOSE REDUCTION: This exam was performed according to the departmental dose-optimization program which includes automated exposure control, adjustment of the mA and/or kV  according to patient size and/or use of iterative reconstruction technique. CONTRAST:  75mL OMNIPAQUE IOHEXOL 350 MG/ML SOLN COMPARISON:  None Available. FINDINGS: CT HEAD FINDINGS Brain: There is no mass, hemorrhage or extra-axial collection. There is generalized atrophy without lobar predilection. There are old bilateral frontal infarcts. There is hypoattenuation of the periventricular white matter, most commonly indicating chronic ischemic microangiopathy. Skull: The visualized skull base, calvarium and extracranial soft tissues are normal. Sinuses/Orbits: No fluid levels or advanced mucosal thickening of the visualized paranasal sinuses. No mastoid or middle ear effusion. The orbits are normal. CTA NECK FINDINGS SKELETON: There is no bony spinal canal  stenosis. No lytic or blastic lesion. OTHER NECK: Normal pharynx, larynx and major salivary glands. No cervical lymphadenopathy. Unremarkable thyroid gland. UPPER CHEST: No pneumothorax or pleural effusion. No nodules or masses. AORTIC ARCH: There is calcific atherosclerosis of the aortic arch. There is no aneurysm, dissection or hemodynamically significant stenosis of the visualized portion of the aorta. Conventional 3 vessel aortic branching pattern. The visualized proximal subclavian arteries are widely patent. RIGHT CAROTID SYSTEM: Normal without aneurysm, dissection or stenosis. LEFT CAROTID SYSTEM: No dissection, occlusion or aneurysm. Mild atherosclerotic calcification at the carotid bifurcation without hemodynamically significant stenosis. VERTEBRAL ARTERIES: Left dominant configuration. Both origins are clearly patent. There is no dissection, occlusion or flow-limiting stenosis to the skull base (V1-V3 segments). CTA HEAD FINDINGS POSTERIOR CIRCULATION: --Vertebral arteries: Normal V4 segments. --Inferior cerebellar arteries: Normal. --Basilar artery: Normal. --Superior cerebellar arteries: Normal. --Posterior cerebral arteries (PCA): Normal. ANTERIOR CIRCULATION: --Intracranial internal carotid arteries: Atherosclerotic calcification of the internal carotid arteries at the skull base without hemodynamically significant stenosis. --Anterior cerebral arteries (ACA): Normal. Both A1 segments are present. Patent anterior communicating artery (a-comm). --Middle cerebral arteries (MCA): Normal. VENOUS SINUSES: As permitted by contrast timing, patent. ANATOMIC VARIANTS: None Review of the MIP images confirms the above findings. IMPRESSION: 1. No emergent large vessel occlusion or hemodynamically significant stenosis of the head or neck. 2. Old bilateral frontal infarcts and chronic ischemic microangiopathy. Aortic Atherosclerosis (ICD10-I70.0). Electronically Signed   By: Deatra RobinsonKevin  Herman M.D.   On: 08/09/2021 19:49   ? ?CT HEAD WO CONTRAST ? ?Result Date: 08/09/2021 ?CLINICAL DATA:  Altered mental status. EXAM: CT HEAD WITHOUT CONTRAST TECHNIQUE: Contiguous axial images were obtained from the base of the skull through the vertex without intravenous contrast. RADIATION DOSE REDUCTION: This exam was performed according to the departmental dose-optimization program which includes automated exposure control, adjustment of the mA and/or kV according to patient size and/or use of iterative reconstruction technique. COMPARISON:  Jul 31, 2020.  Aug 01, 2020. FINDINGS: Brain: Mild chronic ischemic white matter disease is noted old left parietal infarction is noted. No mass effect or midline shift is noted. Ventricular size is within normal limits. There is no evidence of mass lesion, hemorrhage or acute infarction. Vascular: No hyperdense vessel or unexpected calcification. Skull: Normal. Negative for fracture or focal lesion. Sinuses/Orbits: No acute finding. Other: None. IMPRESSION: No acute intracranial abnormality seen. Electronically Signed   By: Lupita RaiderJames  Green Jr M.D.   On: 08/09/2021 18:43  ? ?MR BRAIN WO CONTRAST ? ?Result Date: 08/10/2021 ?CLINICAL DATA:  Slurred speech EXAM: MRI HEAD WITHOUT CONTRAST TECHNIQUE: Multiplanar, multiecho pulse sequences of the brain and surrounding structures were obtained without intravenous contrast. COMPARISON:  None Available. FINDINGS: Brain: Small acute infarct of the right precentral gyrus. No acute or chronic hemorrhage. There is confluent hyperintense T2-weighted signal within the white matter. Generalized cerebral volume loss. There are multiple old  infarcts, including within the left frontal and parietal lobes, right thalamus and within both cerebellar hemispheres. Vascular: Major flow voids are preserved. Skull and upper cervical spine: Normal calvarium and skull base. Visualized upper cervical spine and soft tissues are normal. Sinuses/Orbits:No paranasal sinus fluid levels or advanced  mucosal thickening. No mastoid or middle ear effusion. Normal orbits. IMPRESSION: 1. Small acute infarct of the right precentral gyrus. No hemorrhage or mass effect. 2. Multiple old infarcts and findings of

## 2021-08-10 NOTE — Plan of Care (Signed)
  Problem: Safety: Goal: Ability to remain free from injury will improve Outcome: Progressing   

## 2021-08-10 NOTE — Assessment & Plan Note (Signed)
-   will admit based on TIA/CVA protocol,  ?      Monitor on Tele ?      MRA/MRI await   ?      CTA neg ?      Echo to evaluate for possible embolic source,  ?      obtain cardiac enzymes,  ECG,   Lipid panel, TSH.  ?      Order PT/OT evaluation.  ?      passed swallow eval   ?      Will make sure patient is on antiplatelet ASA 81  Plavix agent and statin   ?      ?      Neurology consulted Have seen pt in ER  ? ?

## 2021-08-10 NOTE — Progress Notes (Signed)
?Progress Note ? ? ?Patient: Thomas Frye [id number] DOB: 03-06-1956 DOA: 08/09/2021     0 ?DOS: the patient was seen and examined on 08/10/2021 ?  ?Brief hospital course: ?HPI on admission:  ?Thomas Frye is a 66 y.o. male with medical history significant of prior CVA on Plavix, HTN  HLD, DM2, sp Left bKA with stump infection on vancomycin sp loop monitor.    ?Presented with  slurred speech and left facial droop x 3 days.   ?Hx of prior CVA. ?Coming from SNF where he's been on IV vancomycin for Left BKA stump infection.   ? ?Admitted to hospital for stroke evaluations. ?Awaiting transfer to [facility]. ? ? ? ?Assessment and Plan: ? ?Slurred speech, left facial droop -concerning for stroke.  Prior history of stroke.  MRI brain showed a small acute infarct of the right precentral gyrus with no hemorrhage or mass effect, multiple old infarcts and findings of chronic ischemic microangiopathy. ?-- Neurology following ?--Transfer to [facility] for stroke team ?--Follow-up pending studies: Echo ?--Follow-up PT OT and speech evaluations ?--Continue aspirin Plavix, high intensity statin ?--Neurochecks ?--No need for permissive hypertension given 3-day history of symptoms prior to admission ? ?Left BKA stump infection -continue IV vancomycin per pharmacy ? ?Recurrent episodes of hypoglycemia -frequent CBGs to monitor closely.  Hypoglycemia protocol ? ?Type 2 diabetes with vascular complications -insulin held on admission due to hypoglycemia.   ?Very sensitive sliding scale NovoLog ?Carb modified diet ? ?Hypertension  -continue home lisinopril ? ?Hyperlipidemia -continue Lipitor ? ?GERD -continue PPI ? ?Restless leg syndrome -Mirapex ? ?Depression -Afaxin, Seroquel ? ?Pancreatic insufficiency -Creon ? ?  ? ?Subjective: Patient seen in the ED holding for bed this morning.  Speech is still slurred and slight left facial droop still present.  He denies any word finding difficulties or upper or lower extremity weakness  numbness or tingling.  No other acute complaints or acute events reported. ? ?Physical Exam: ?Vitals:  ? 08/10/21 1030 08/10/21 1100 08/10/21 1130 08/10/21 1231  ?BP: (!) 188/99 (!) 197/97 (!) 197/97 (!) 164/82  ?Pulse: 88 89 89 94  ?Resp: 16 13 16 14   ?Temp:   97.9 ?F (36.6 ?C) 98.2 ?F (36.8 ?C)  ?TempSrc:   Oral Oral  ?SpO2: 94% 93% 93% 94%  ?Weight:      ?Height:      ? ?General exam: awake, alert, no acute distress ?HEENT: atraumatic, clear conjunctiva, anicteric sclera, moist mucus membranes, hearing grossly normal  ?Respiratory system: CTAB, no wheezes, rales or rhonchi, normal respiratory effort. ?Cardiovascular system: normal S1/S2, RRR, trace right lower extremity edema.   ?Gastrointestinal system: soft, NT, ND, no HSM felt, +bowel sounds. ?Central nervous system: Mild slurred speech, mild left facial droop, A&O x4.  Grip strength symmetric 5/5.  No other focal neurologic deficits ?Extremities: Left BKA stump with stocking in place, trace right lower extremity edema, moves all extremities, normal tone ?Skin: dry, intact, normal temperature ?Psychiatry: normal mood, congruent affect, judgement and insight appear normal ? ? ? ?Data Reviewed: ? ?Notable labs: Glucose 165, AST 14, prealbumin 15.4.  Lipid panel normal.  Hemoglobin 11.9 ? ?Echocardiogram --- ? 1. Left ventricular ejection fraction, by estimation, is 60 to 65%. The left ventricle has normal function. The left ventricle has no regional wall motion abnormalities. There is mild concentric left ventricular hypertrophy. Left ventricular diastolic parameters are consistent with Grade I diastolic dysfunction (impaired  ?relaxation).  ? 2. Right ventricular systolic function is normal. The right ventricular size  is normal.  ? 3. Left atrial size was mildly dilated.  ? 4. The mitral valve is normal in structure. Trivial mitral valve  ?regurgitation. No evidence of mitral stenosis.  ? 5. The aortic valve is tricuspid. Aortic valve regurgitation is not   ?visualized. No aortic stenosis is present.  ? 6. The inferior vena cava is normal in size with greater than 50%  ?respiratory variability, suggesting right atrial pressure of 3 mmHg.  ? ? ?Family Communication: None at bedside wish to call ? ?Disposition: ?Status is: Inpatient ?Remains inpatient appropriate because: Stroke evaluation ongoing ? Planned Discharge Destination: Skilled nursing facility ? ? ? ?Time spent: 35 minutes ? ?Author: ?Ezekiel Slocumb, DO ?08/10/2021 5:37 PM ? ?For on call review [email or web address].  ?

## 2021-08-10 NOTE — Progress Notes (Signed)
Patient admitted to 1618 from ED in NAD. Was hypertensive in ED, SBP now down to 160's from 190's. A/Ox4. On RA. Patient oriented to unit and call bell. Fall precautions in place. Family aware of transfer.  ? ?

## 2021-08-10 NOTE — Evaluation (Signed)
Physical Therapy Evaluation ?Patient Details ?Name: Thomas Frye ?MRN: [id number] ?DOB: 1955-12-20 ?Today's Date: 08/10/2021 ? ?History of Present Illness ? Thomas Frye is a 66 y.o. male with medical history significant of prior CVA , HTN  HLD, DM2, sp Left bKA with residual limb  infection ,sp Loop monitor presents to Ed 08/09/21 from SNF with slurred speech and some word finding difficulties. MRI results:1. Small acute infarct of the right precentral gyrus. No hemorrhage  or mass effect.  2. Multiple old infarcts and findings of chronic ischemic  microangiopathy.  ?Clinical Impression ? The patient reports feeling very tired. Patient  has been in SNF rehab x ~ 1 month. Patient  reports mod I transfers at facility. Unable  to assess transfers from gurney. Patient will benefit from return to SNF to continue rehab.  Pt admitted with above diagnosis.  Pt currently with functional limitations due to the deficits listed below (see PT Problem List). Pt will benefit from skilled PT to increase their independence and safety with mobility to allow discharge to the venue listed below.   ?BP 189/103 in supine, 152/83 when sitting upright in long sitting posture. Does not report dizziness. ? ?   ? ?Recommendations for follow up therapy are one component of a multi-disciplinary discharge planning process, led by the attending physician.  Recommendations may be updated based on patient status, additional functional criteria and insurance authorization. ? ?Follow Up Recommendations Skilled nursing-short term rehab (<3 hours/day) ? ?  ?Assistance Recommended at Discharge Intermittent Supervision/Assistance  ?Patient can return home with the following ? A little help with walking and/or transfers;A little help with bathing/dressing/bathroom;Help with stairs or ramp for entrance;Assistance with cooking/housework;Assist for transportation ? ?  ?Equipment Recommendations None recommended by PT  ?Recommendations for Other  Services ?    ?  ?Functional Status Assessment Patient has had a recent decline in their functional status and/or demonstrates limited ability to make significant improvements in function in a reasonable and predictable amount of time  ? ?  ?Precautions / Restrictions Precautions ?Precautions: Fall ?Precaution Comments: L BKA, monitor vitals,  ? ?  ? ?Mobility ? Bed Mobility ?  ?  ?  ?  ?  ?  ?  ?General bed mobility comments: with increased time in stretcher with use of bilateral bed rails to sit straight up into long sitting, did not swing around to sit onto  edge of stretcher. ?  ? ?Transfers ?  ?  ?  ?  ?  ?  ?  ?  ?  ?General transfer comment: NT ?  ? ?Ambulation/Gait ?  ?  ?  ?  ?  ?  ?  ?  ? ?Stairs ?  ?  ?  ?  ?  ? ?Wheelchair Mobility ?  ? ?Modified Rankin (Stroke Patients Only) ?  ? ?  ? ?Balance Overall balance assessment: Needs assistance ?Sitting-balance support: No upper extremity supported, Feet unsupported ?  ?Sitting balance - Comments: sits at mid line in  long sitting. ?  ?  ?  ?Standing balance comment: NT ?  ?  ?  ?  ?  ?  ?  ?  ?  ?  ?  ?   ? ? ? ?Pertinent Vitals/Pain Pain Assessment ?Pain Assessment: No/denies pain  ? ? ?Home Living Family/patient expects to be discharged to:: Skilled nursing facility ?  ?  ?  ?  ?  ?  ?  ?  ?  ?   ?  ?  Prior Function Prior Level of Function : Needs assist ?  ?  ?  ?  ?  ?  ?Mobility Comments: patient reported he was able to transfer himself at wheelchair level from bed to [facility] at facility.Unable to wear prosthesis due to non healed incision residual limb ?ADLs Comments: was independent with ADLs at wheelchair level at SNF ?  ? ? ?Hand Dominance  ? Dominant Hand: Left ? ?  ?Extremity/Trunk Assessment  ? Upper Extremity Assessment ?Upper Extremity Assessment: Overall WFL for tasks assessed ?  ? ?Lower Extremity Assessment ?Lower Extremity Assessment: RLE deficits/detail;LLE deficits/detail ?RLE Deficits / Details: did not attempt trnasfer. grossly strength  appears WFL ?LLE Deficits / Details: BKA with staples on incision line. rewrapped with kerlix, ace and shrinker. ?  ? ?Cervical / Trunk Assessment ?Cervical / Trunk Assessment: Kyphotic  ?Communication  ? Communication: Expressive difficulties (noted speech slurred,)  ?Cognition   ?  ?  ?  ?  ?  ?  ?  ?  ?  ?  ?  ?  ?  ?  ?  ?  ?  ?  ?General Comments: patient reported feeling tired this AM. patient was able to follow commands. patient was oriented to self and hospital. patient was within 1 day of day of week and date of today. month and year were accurate. Stated" I have lost a day" ?  ?  ? ?  ?General Comments   ? ?  ?Exercises    ? ?Assessment/Plan  ?  ?PT Assessment Patient needs continued PT services  ?PT Problem List Decreased strength;Decreased knowledge of precautions;Decreased mobility;Cardiopulmonary status limiting activity;Decreased activity tolerance;Decreased safety awareness;Decreased skin integrity ? ?   ?  ?PT Treatment Interventions Therapeutic activities;Therapeutic exercise;Patient/family education;Functional mobility training   ? ?PT Goals (Current goals can be found in the Care Plan section)  ?Acute Rehab PT Goals ?Patient Stated Goal: to get prosthesis fitted and walk ?PT Goal Formulation: With patient ?Time For Goal Achievement: 08/24/21 ?Potential to Achieve Goals: Fair ? ?  ?Frequency Min 2X/week ?  ? ? ?Co-evaluation PT/OT/SLP Co-Evaluation/Treatment: Yes ?Reason for Co-Treatment: For patient/therapist safety ?PT goals addressed during session: Mobility/safety with mobility ?OT goals addressed during session: ADL's and self-care ?  ? ? ?  ?AM-PAC PT "6 Clicks" Mobility  ?Outcome Measure Help needed turning from your back to your side while in a flat bed without using bedrails?: A Little ?Help needed moving from lying on your back to sitting on the side of a flat bed without using bedrails?: A Little ?Help needed moving to and from a bed to a chair (including a wheelchair)?: Total ?Help  needed standing up from a chair using your arms (e.g., wheelchair or bedside chair)?: Total ?Help needed to walk in hospital room?: Total ?Help needed climbing 3-5 steps with a railing? : Total ?6 Click Score: 10 ? ?  ?End of Session   ?Activity Tolerance: Patient limited by fatigue ?Patient left: in bed;with call bell/phone within reach ?Nurse Communication: Mobility status ?PT Visit Diagnosis: Muscle weakness (generalized) (M62.81) ?  ? ?Time: 7322-0254 ?PT Time Calculation (min) (ACUTE ONLY): 18 min ? ? ?Charges:   PT Evaluation ?$PT Eval Low Complexity: 1 Low ?  ?  ?   ? ? ?Blanchard Kelch PT ?Acute Rehabilitation Services ?Pager [phone] ?Office [phone] ? ? ?Roneka Gilpin, Jobe Igo ?08/10/2021, 10:25 AM ?

## 2021-08-10 NOTE — Progress Notes (Signed)
Pharmacy Antibiotic Note ? ?Thomas Frye is a 66 y.o. male admitted on 08/09/2021 with  past medical history of right thalamic, left parietal white matter strokes with no residual deficits, diabetes with complications such as neuropathy and prior history of DKA, sleep apnea, recent left BKA stump infection getting vancomycin through PICC line-presenting to the emergency room for evaluation of speech deficits.  Pharmacy has been consulted for vancomycin dosing for cellulitis. ? ?Pt's home dose vancomycin 1gm IV q12h, LD 5/15 @ 0800 ? ?Plan: ?Continue vancomycin 1gm IV q12h ?Follow renal function and clinical course ?Vanc levels as needed ? ?Height: 6\' 3"  (190.5 cm) ?Weight: 134.3 kg (296 lb) ?IBW/kg (Calculated) : 84.5 ? ?Temp (24hrs), Avg:98.1 ?F (36.7 ?C), Min:98.1 ?F (36.7 ?C), Max:98.1 ?F (36.7 ?C) ? ?Recent Labs  ?Lab 08/09/21 ?1800  ?WBC 7.0  ?CREATININE 1.15  ?  ?Estimated Creatinine Clearance: 97.1 mL/min (by C-G formula based on SCr of 1.15 mg/dL).   ? ?Allergies  ?Allergen Reactions  ? Latex Swelling  ? Sulfa Antibiotics Hives  ? ? ?Thank you for allowing pharmacy to be a part of this patient?s care. ? ?08/11/21 RPh ?08/10/2021, 12:14 AM ? ?

## 2021-08-11 DIAGNOSIS — R29818 Other symptoms and signs involving the nervous system: Secondary | ICD-10-CM | POA: Diagnosis not present

## 2021-08-11 LAB — GLUCOSE, CAPILLARY
Glucose-Capillary: 174 mg/dL — ABNORMAL HIGH (ref 70–99)
Glucose-Capillary: 182 mg/dL — ABNORMAL HIGH (ref 70–99)
Glucose-Capillary: 223 mg/dL — ABNORMAL HIGH (ref 70–99)
Glucose-Capillary: 349 mg/dL — ABNORMAL HIGH (ref 70–99)
Glucose-Capillary: 351 mg/dL — ABNORMAL HIGH (ref 70–99)
Glucose-Capillary: 417 mg/dL — ABNORMAL HIGH (ref 70–99)

## 2021-08-11 LAB — BASIC METABOLIC PANEL
Anion gap: 8 (ref 5–15)
BUN: 11 mg/dL (ref 8–23)
CO2: 27 mmol/L (ref 22–32)
Calcium: 9.3 mg/dL (ref 8.9–10.3)
Chloride: 102 mmol/L (ref 98–111)
Creatinine, Ser: 0.71 mg/dL (ref 0.61–1.24)
GFR, Estimated: >60 mL/min (ref >60)
Glucose, Bld: 175 mg/dL — ABNORMAL HIGH (ref 70–99)
Potassium: 3.8 mmol/L (ref 3.5–5.1)
Sodium: 137 mmol/L (ref 135–145)

## 2021-08-11 LAB — HEMOGLOBIN A1C
Hgb A1c MFr Bld: 7.5 % — ABNORMAL HIGH (ref 4.8–5.6)
Mean Plasma Glucose: 168.55 mg/dL

## 2021-08-11 LAB — MAGNESIUM: Magnesium: 1.9 mg/dL (ref 1.7–2.4)

## 2021-08-11 MED ORDER — INSULIN ASPART 100 UNIT/ML IJ SOLN
7.0000 [IU] | Freq: Once | INTRAMUSCULAR | Status: AC
Start: 2021-08-11 — End: 2021-08-11
  Administered 2021-08-11: 7 [IU] via SUBCUTANEOUS

## 2021-08-11 MED ORDER — PANCRELIPASE (LIP-PROT-AMYL) 12000-38000 UNITS PO CPEP
12000.0000 [IU] | ORAL_CAPSULE | Freq: Once | ORAL | Status: AC
  Administered 2021-08-11: 12000 [IU] via ORAL
  Filled 2021-08-11: qty 1

## 2021-08-11 MED ORDER — ENSURE ENLIVE PO LIQD
237.0000 mL | Freq: Two times a day (BID) | ORAL | Status: DC
  Administered 2021-08-11 – 2021-08-13 (×3): 237 mL via ORAL

## 2021-08-11 MED ORDER — PANCRELIPASE (LIP-PROT-AMYL) 12000-38000 UNITS PO CPEP
24000.0000 [IU] | ORAL_CAPSULE | Freq: Three times a day (TID) | ORAL | Status: DC
  Administered 2021-08-11 – 2021-08-13 (×6): 24000 [IU] via ORAL
  Filled 2021-08-11 (×6): qty 2

## 2021-08-11 MED ORDER — DOXYCYCLINE HYCLATE 100 MG PO TABS
100.0000 mg | ORAL_TABLET | Freq: Two times a day (BID) | ORAL | Status: DC
  Administered 2021-08-13: 100 mg via ORAL
  Filled 2021-08-11: qty 1

## 2021-08-11 MED ORDER — ADULT MULTIVITAMIN W/MINERALS CH
1.0000 | ORAL_TABLET | Freq: Every day | ORAL | Status: DC
  Administered 2021-08-11 – 2021-08-13 (×3): 1 via ORAL
  Filled 2021-08-11 (×3): qty 1

## 2021-08-11 MED ORDER — INSULIN GLARGINE-YFGN 100 UNIT/ML ~~LOC~~ SOLN
20.0000 [IU] | Freq: Every day | SUBCUTANEOUS | Status: DC
  Administered 2021-08-11 – 2021-08-13 (×3): 20 [IU] via SUBCUTANEOUS
  Filled 2021-08-11 (×3): qty 0.2

## 2021-08-11 NOTE — TOC Initial Note (Signed)
Transition of Care (TOC) - Initial/Assessment Note  ? ? ?Patient Details  ?Name: Thomas Frye ?MRN: [id number] ?Date of Birth: 10/27/1955 ? ?Transition of Care (TOC) CM/SW Contact:    ?Harwood Nall, Meriam Sprague, RN ?Phone Number: ?08/11/2021, 1:16 PM ? ?Clinical Narrative:                 ?Spoke with pt at length for dc planning. Pt has been at [facility] for the last month for rehab. Per the MD notes pt had been having neurological symptoms for a couple of days prior to being sent to the hospital. Pt was asked if he had expressed his concern with the symptoms prior to him being sent to the hospital and he says he had not. He states that he had a small facial droop and slow speech prior to this incident so the staff would likely not have noticed had he not said something. He states that staff sent him directly to the hospital once he mentioned his symptoms. Pt states he wants to go back to [facility] at DC and he feels completely safe there.  ? ?Pt will need a new insurance auth for SNF to return to [facility]. Auth initiated with [facility] #[id number]. ? ?Expected Discharge Plan: Skilled Nursing Facility ?Barriers to Discharge: Continued Medical Work up ? ? ?Patient Goals and CMS Choice ?Patient states their goals for this hospitalization and ongoing recovery are:: To go back o [facility] ?  ?  ? ?Expected Discharge Plan and Services ?Expected Discharge Plan: Skilled Nursing Facility ?  ?Discharge Planning Services: CM Consult ?  ?Living arrangements for the past 2 months: Skilled Nursing Facility, Single Family Home ?                ?  ?Prior Living Arrangements/Services ?Living arrangements for the past 2 months: Skilled Nursing Facility, Single Family Home ?Lives with:: Spouse, Facility Resident ?Patient language and need for interpreter reviewed:: Yes ?Do you feel safe going back to the place where you live?: Yes      ?Need for Family Participation in Patient Care: Yes (Comment) ?Care giver support  system in place?: Yes (comment) ?  ?Criminal Activity/Legal Involvement Pertinent to Current Situation/Hospitalization: No - Comment as needed ? ?Activities of Daily Living ?Home Assistive Devices/Equipment: Wheelchair, Eyeglasses (reading glasses) ?ADL Screening (condition at time of admission) ?Patient's cognitive ability adequate to safely complete daily activities?: Yes ?Is the patient deaf or have difficulty hearing?: No ?Does the patient have difficulty seeing, even when wearing glasses/contacts?: No ?Does the patient have difficulty concentrating, remembering, or making decisions?: Yes (some trouble since last stroke) ?Patient able to express need for assistance with ADLs?: Yes (is slow talking during the hx) ?Does the patient have difficulty dressing or bathing?: No ?Independently performs ADLs?: No ?Communication: Independent ?Dressing (OT): Independent ?Grooming: Independent ?Feeding: Independent ?Bathing: Independent ?Toileting: Independent ?In/Out Bed: Independent ?Walks in Home: Dependent ?Is this a change from baseline?: Pre-admission baseline ?Does the patient have difficulty walking or climbing stairs?: Yes ?Weakness of Legs: None ?Weakness of Arms/Hands: None ? ?Permission Sought/Granted ?Permission sought to share information with : Magazine features editor ?Permission granted to share information with : Yes, Verbal Permission Granted ?   ? Permission granted to share info w AGENCY: [facility] via hub ?   ?   ? ?Emotional Assessment ?Appearance:: Appears older than stated age ?Attitude/Demeanor/Rapport: Gracious ?Affect (typically observed): Calm ?Orientation: : Oriented to Self, Oriented to Place, Oriented to  Time, Oriented  to Situation ?Alcohol / Substance Use: Not Applicable ?Psych Involvement: No (comment) ? ?Admission diagnosis:  Slurred speech [R47.81] ?Hypoglycemia [E16.2] ?Focal neurological deficit [R29.818] ?Acute CVA (cerebrovascular accident) (HCC) [I63.9] ?Patient Active  Problem List  ? Diagnosis Date Noted  ? Stroke-like symptom 08/10/2021  ? Acute CVA (cerebrovascular accident) (HCC) 08/10/2021  ? Focal neurological deficit 08/09/2021  ? 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 Cellulitis of left lower extremity without foot 06/10/2021  ? Type 2 diabetes mellitus with hyperglycemia (HCC) 06/10/2021  ? Pancreatic insufficiency 06/10/2021  ? 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 DKA (diabetic ketoacidoses) 10/06/2015  ? Hyperlipidemia 10/06/2015  ? GERD (gastroesophageal reflux disease) 10/06/2015  ? AKI (acute kidney injury) (HCC) 10/06/2015  ? OSA (obstructive sleep apnea) 10/06/2015  ? Acute renal insufficiency   ? Nausea & vomiting 12/11/2012  ? 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.2) 10/05/2012  ? 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 Anoxic encephalopathy (HCC) 08/30/2012  ? ? 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 Acute respiratory failure in setting of profound hypoglycemia 08/18/2012  ? Severe diabetic hypoglycemia (HCC) 08/18/2012  ? DM (diabetes mellitus), type 2 with peripheral vascular complications (HCC) 08/18/2012  ? Acute encephalopathy 08/18/2012  ? HTN (hypertension) 08/18/2012  ? Noncompliance 08/18/2012  ? ?PCP:  Rosie Fate, MD ?Pharmacy:   ?[facility] [place] [place] - [address] ?[address] ?[address] ?[place] [address] ?Phone: [phone] Fax: [phone] ? ? ? ? ?Readmission Risk Interventions ? ?  08/11/2021  ?  1:04 PM  ?Readmission Risk Prevention Plan  ?Transportation Screening Complete  ?PCP or Specialist Appt within 5-7 Days Complete  ?Home Care Screening Complete  ?Medication Review (RN CM) Complete  ? ? ? ?

## 2021-08-11 NOTE — Progress Notes (Signed)
Inpatient Diabetes Program Recommendations ? ?AACE/ADA: New Consensus Statement on Inpatient Glycemic Control (2015) ? ?Target Ranges:  Prepandial:   less than 140 mg/dL ?     Peak postprandial:   less than 180 mg/dL (1-2 hours) ?     Critically ill patients:  140 - 180 mg/dL  ? ?Lab Results  ?Component Value Date  ? GLUCAP 223 (H) 08/11/2021  ? HGBA1C 7.5 (H) 08/11/2021  ? ? ?Review of Glycemic Control ? ?Diabetes history: DM2 ?Outpatient Diabetes medications: Tresiba 54 units QD, Humalog s/s ?Current orders for Inpatient glycemic control: Semglee 20 QD, Novolog 0-6 units Q4H ? ?HgbA1C - 7.5% ?174, 182, 223 thus far today ? ?Inpatient Diabetes Program Recommendations:   ? ?Blood sugars trending well. ? ?Spoke with pt at bedside regarding his diabetes. Discussed HgbA1C of 7.5% and pt states he wants it to be below 7%. Discussed plate method and portion control, along with staying as physically active as possible. Pt appreciative of visit yesterday.  ? ?Continue to follow while inpatient,.  ?Thank you. ?Ailene Ards, RD, LDN, CDE ?Inpatient Diabetes Coordinator ?[phone]  ? ? ? ?

## 2021-08-11 NOTE — Progress Notes (Signed)
Initial Nutrition Assessment ? ?DOCUMENTATION CODES:  ? ?Obesity unspecified ? ?INTERVENTION:  ? ?-Ensure Plus High Protein po BID, each supplement provides 350 kcal and 20 grams of protein.  ? ?-Multivitamin with minerals daily ? ?NUTRITION DIAGNOSIS:  ? ?Increased nutrient needs related to acute illness (left BKA infection) as evidenced by estimated needs. ? ?GOAL:  ? ?Patient will meet greater than or equal to 90% of their needs ? ?MONITOR:  ? ?PO intake, Supplement acceptance, Labs, Weight trends, I & O's, Skin ? ?REASON FOR ASSESSMENT:  ? ?Malnutrition Screening Tool ?  ? ?ASSESSMENT:  ? ?66 y.o. male with medical history significant of prior CVA on Plavix, HTN  HLD, DM2, sp Left bKA with stump infection on vancomycin sp loop monitor. Presented with  slurred speech and left facial droop x 3 days.    Hx of prior CVA.  Coming from SNF where he's been on IV vancomycin for Left BKA stump infection. Admitted to hospital for stroke evaluations. ? ?Patient in room, lunch tray set up in front of him. Pt state he feels hungry and is waiting on his Creon before eating. Pt states his blood sugars have been controlled PTA. Noted HgbA1c of 7.5. ?Pt underwent stroke work-up. Noticeable left facial droop but speech is understandable.  ?Pt states he does not like the food at the SNF he was at.  ?Pt requesting Ensure, strawberry flavor. Will monitor PO intakes and CBGs. ? ?Per weight records, no weight loss noted. ? ?Medications: ferrous sulfate, Creon ? ?Labs reviewed: ? CBGs: [id number] ? ?NUTRITION - FOCUSED PHYSICAL EXAM: ? ?No depletions noted. ? ?Diet Order:   ?Diet Order   ? ?       ?  Diet Carb Modified Fluid consistency: Thin; Room service appropriate? Yes  Diet effective now       ?  ? ?  ?  ? ?  ? ? ?EDUCATION NEEDS:  ? ?No education needs have been identified at this time ? ?Skin:  Skin Assessment: Skin Integrity Issues: ?Skin Integrity Issues:: Other (Comment) ?Other: Left BKA infection ? ?Last BM:  PTA ? ?Height:   ? ?Ht Readings from Last 1 Encounters:  ?08/09/21 6\' 3"  (1.905 m)  ? ? ?Weight:  ? ?Wt Readings from Last 1 Encounters:  ?08/09/21 134.3 kg  ? ? ?BMI:  Body mass index is 39 kg/m?. -adjusted for left BKA ? ?Estimated Nutritional Needs:  ? ?Kcal:  2000-2200 ? ?Protein:  100-110g ? ?Fluid:  2L/day ? ?Clayton Bibles, MS, RD, LDN ?Inpatient Clinical Dietitian ?Contact information available via Amion ? ?

## 2021-08-11 NOTE — Evaluation (Signed)
Speech Language Pathology Evaluation ?Patient Details ?Name: Thomas Frye ?MRN: [id number] ?DOB: [birth year] ?Today's Date: 08/11/2021 ?Time: 1040-1050 ?SLP Time Calculation (min) (ACUTE ONLY): 10 min ? ?Problem List:  ?Patient Active Problem List  ? Diagnosis Date Noted  ? Stroke-like symptom 08/10/2021  ? Acute CVA (cerebrovascular accident) (HCC) 08/10/2021  ? Focal neurological deficit 08/09/2021  ? [facility])   ? Cellulitis of left lower extremity without foot 06/10/2021  ? Type 2 diabetes mellitus with hyperglycemia (HCC) 06/10/2021  ? Pancreatic insufficiency 06/10/2021  ? 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 DKA (diabetic ketoacidoses) 10/06/2015  ? Hyperlipidemia 10/06/2015  ? GERD (gastroesophageal reflux disease) 10/06/2015  ? AKI (acute kidney injury) (HCC) 10/06/2015  ? OSA (obstructive sleep apnea) 10/06/2015  ? Acute renal insufficiency   ? Nausea & vomiting 12/11/2012  ? 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.2) 10/05/2012  ? 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 Anoxic encephalopathy (HCC) 08/30/2012  ? ? 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 Acute respiratory failure in setting of profound hypoglycemia 08/18/2012  ? Severe diabetic hypoglycemia (HCC) 08/18/2012  ? DM (diabetes mellitus), type 2 with peripheral vascular complications (HCC) 08/18/2012  ? Acute encephalopathy 08/18/2012  ? HTN (hypertension) 08/18/2012  ? Noncompliance 08/18/2012  ? ?Past Medical History:  ?Past Medical History:  ?Diagnosis Date  ? ARF (acute renal failure) (HCC) 09/2015  ? Cerebrovascular disease   ? Right thalamic, left parietal white matter strokes  ? Depression   ? Diabetes mellitus   ? Diabetic peripheral neuropathy (HCC)   ? DKA (diabetic ketoacidoses) 09/2015  ? Gait disturbance   ? GERD (gastroesophageal reflux disease)   ? H/O hiatal hernia   ? Left foot  drop 10/05/2012  ? Neuromuscular disorder (HCC)   ? neuropathy  ? 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.2) 10/05/2012  ? Sciatic neuropathy   ? Left-sided nerve  ? Sleep apnea   ? wears cpap at night  ? ?Past Surgical History:  ?Past Surgical History:  ?Procedure Laterality Date  ? EYE SURGERY    ? FRACTURE SURGERY    ? Questionable steel screws to right tibia  ? TOE AMPUTATION  2008  ? ?HPI:  ?Thomas Frye is a 66 y.o. male with history of hypertension, hyperlipidemia, adult onset type 1 DM, DKA, OSA, PVD, left BKA, strokes admitted for slurred speech. Has a history of prior CVA in 2014 and 2022.  ? ?Assessment / Plan / Recommendation ?Clinical Impression ? Pt demonstrates mild dysarthria consistent with some of the documentation available from 2014 CVA, though pt reports he recovered since then and this is a regression to prior impairment. He does report some ongoing difficulty that he has continued to address with a home health SLP and occasional difficulty being understood by his family. His speech is charaterized by slow rate, abnormal prosody and intonation with occasional part word deletions. Pt was clearly utilizing compensatory strategies and was 100% intelligible to SLP and to nutrition services employee on the phone. Pt would benefit from follow up with home health SLP at his prior facility to address articulation. Cognition and language are WNL. ?   ?SLP Assessment ? SLP Recommendation/Assessment: All further Speech Lanaguage Pathology  needs can be addressed in the next venue of care ?SLP Visit Diagnosis: Dysarthria and anarthria (R47.1)  ?  ?Recommendations for follow up therapy are one component of a multi-disciplinary  discharge planning process, led by the attending physician.  Recommendations may be updated based on patient status, additional functional criteria and insurance authorization. ?   ?Follow Up Recommendations ? Home health SLP  ?  ?Assistance Recommended at Discharge ?    ?Functional  Status Assessment    ?Frequency and Duration    ?  ?  ?   ?SLP Evaluation ?Cognition ? Overall Cognitive Status: Within Functional Limits for tasks assessed  ?  ?   ?Comprehension ? Auditory Comprehension ?Overall Auditory Comprehension: Appears within functional limits for tasks assessed  ?  ?Expression Verbal Expression ?Overall Verbal Expression: Appears within functional limits for tasks assessed ?Written Expression ?Dominant Hand: Left   ?Oral / Motor ? Oral Motor/Sensory Function ?Overall Oral Motor/Sensory Function: Mild impairment ?Facial ROM: Reduced right ?Facial Symmetry: Abnormal symmetry right ?Facial Strength: Reduced right ?Facial Sensation: Within Functional Limits ?Motor Speech ?Overall Motor Speech: Impaired ?Respiration: Within functional limits ?Phonation: Normal ?Articulation: Impaired ?Level of Impairment: Phrase ?Intelligibility: Intelligible ?Motor Planning: Witnin functional limits ?Motor Speech Errors: Aware;Consistent ?Interfering Components: Premorbid status ?Effective Techniques: Slow rate;Over-articulate   ?        ? ?Conor Lata, Riley Nearing ?08/11/2021, 11:12 AM ? ?

## 2021-08-11 NOTE — Plan of Care (Addendum)
Received report from [employer] rep that pt loop recorder interrogation showed no afib so far. A1C came back as 7.5. pt stroke work up all done. Continue ASA and brilinta DAPT for 30 days and then back to plavix alone. Aggressive risk factor modification. Continue lipitor 40. Vancomycin treatment duration per primary team. PT/OT recommend SNF. He has been following with [facility] neurology Dr. Maurice Small as outpt, and will continue follow up with him. [facility] will continue follow up with his loop recorder.  ? ?Neurology will sign off. Please call with questions. Pt will follow up with [facility] neurology Dr. Maurice Small in about 4 weeks. Thanks for the consult. ? ?Rosalin Hawking, MD PhD ?Stroke Neurology ?08/11/2021 ?11:03 AM ? ? ?

## 2021-08-11 NOTE — Progress Notes (Signed)
Occupational Therapy Treatment ?Patient Details ?Name: Thomas Frye ?MRN: [id number] ?DOB: Feb 14, 1956 ?Today's Date: 08/11/2021 ? ? ?History of present illness Thomas Frye is a 67 y.o. male with medical history significant of prior CVA , HTN  HLD, DM2, sp Left bKA with residual limb  infection ,sp Loop monitor presents to [facility] 08/09/21 from SNF with slurred speech and some word finding difficulties. MRI results:1. Small acute infarct of the right precentral gyrus. No hemorrhage  or mass effect.  2. Multiple old infarcts and findings of chronic ischemic  microangiopathy. ?  ?OT comments ? Treatment focused on toileting and functional mobility. Today patient mod x 2 to stand with RW from elevated bed to [facility]. Due to difficulty with initial transfer used stedy to rise from [facility] and to assist with maintaining standing and safety with pericare. Total assist for toileting due to narrowness of BSC and need of upper extremities for transfers. Patient total assist for transfer to recliner secondary to use of stedy but only min assist to power up with stedy. Patient making progress but reports he is weaker than he was. Continue to recommend return to facility for further therapy.  ? ?Recommendations for follow up therapy are one component of a multi-disciplinary discharge planning process, led by the attending physician.  Recommendations may be updated based on patient status, additional functional criteria and insurance authorization. ?   ?Follow Up Recommendations ? Skilled nursing-short term rehab (<3 hours/day)  ?  ?Assistance Recommended at Discharge Frequent or constant Supervision/Assistance  ?Patient can return home with the following ? A lot of help with walking and/or transfers;A lot of help with bathing/dressing/bathroom;Direct supervision/assist for medications management;Direct supervision/assist for financial management;Assist for transportation;Help with stairs or ramp for entrance ?  ?Equipment  Recommendations ? Other (comment) (Defer)  ?  ?Recommendations for Other Services   ? ?  ?Precautions / Restrictions Precautions ?Precautions: Fall ?Precaution Comments: L BKA, monitor vitals, ?Restrictions ?Weight Bearing Restrictions: No  ? ? ?  ? ?Mobility Bed Mobility ?Overal bed mobility: Needs Assistance ?Bed Mobility: Supine to Sit ?  ?  ?Supine to sit: Supervision ?  ?  ?General bed mobility comments: supervision to transfer to edge of bed ?  ? ?Transfers ?Overall transfer level: Needs assistance ?Equipment used: Rolling walker (2 wheels) ?Transfers: Sit to/from Stand, Bed to chair/wheelchair/BSC ?Sit to Stand: Mod assist, +2 physical assistance, From elevated surface ?Stand pivot transfers: Mod assist, +2 physical assistance ?  ?  ?  ?  ?General transfer comment: Mod x 2 to stand and pivot to [facility] needing physical assistance, steadying of walker and BSC for safety. use of stedy to stand for pericare and transfer to recilner. ?  ?  ?Balance Overall balance assessment: Needs assistance ?Sitting-balance support: No upper extremity supported, Feet supported ?Sitting balance-Leahy Scale: Good ?  ?  ?Standing balance support: During functional activity, Reliant on assistive device for balance ?Standing balance-Leahy Scale: Poor ?  ?  ?  ?  ?  ?  ?  ?  ?  ?  ?  ?  ?   ? ?ADL either performed or assessed with clinical judgement  ? ?ADL Overall ADL's : Needs assistance/impaired ?  ?  ?  ?  ?  ?  ?  ?  ?  ?  ?Lower Body Dressing: Set up;Sitting/lateral leans;Moderate assistance ?Lower Body Dressing Details (indicate cue type and reason): able to don right shoe at edge of bed - but needs assistance to stand to manage pulling  clothing up ?Toilet Transfer: +2 for physical assistance;+2 for safety/equipment;Moderate assistance ?Toilet Transfer Details (indicate cue type and reason): mod x 2 to [facility] to assist with stabilizing walker, BSC and physical assist to power up. Used stedy to stand from [facility] for safety  with more min assist when he had bar to pull up on. Able to maintain stand for approx 20 seconds. ?Toileting- ArchitectClothing Manipulation and Hygiene: Total assistance;Sit to/from stand ?Toileting - Clothing Manipulation Details (indicate cue type and reason): total assist for  perianal care . patient able to maintain standing position ?  ?  ?Functional mobility during ADLs: Moderate assistance;+2 for physical assistance ?  ?  ? ?Extremity/Trunk Assessment Upper Extremity Assessment ?Upper Extremity Assessment: Overall WFL for tasks assessed ?  ?Lower Extremity Assessment ?Lower Extremity Assessment: Defer to PT evaluation ?  ?  ?  ? ?Vision Patient Visual Report: No change from baseline ?  ?  ?Perception   ?  ?Praxis   ?  ? ?Cognition Arousal/Alertness: Awake/alert ?Behavior During Therapy: [facility] for tasks assessed/performed ?Overall Cognitive Status: Within Functional Limits for tasks assessed ?  ?  ?  ?  ?  ?  ?  ?  ?  ?  ?  ?  ?  ?  ?  ?  ?General Comments: some difficulty with stutteting and slow speech ?  ?  ?   ?Exercises   ? ?  ?Shoulder Instructions   ? ? ?  ?General Comments    ? ? ?Pertinent Vitals/ Pain       Pain Assessment ?Pain Assessment: No/denies pain ? ?Home Living   ?  ?  ?  ?  ?  ?  ?  ?  ?  ?  ?  ?  ?  ?  ?  ?  ?  ?  ? ?  ?Prior Functioning/Environment    ?  ?  ?  ?   ? ?Frequency ? Min 2X/week  ? ? ? ? ?  ?Progress Toward Goals ? ?OT Goals(current goals can now be found in the care plan section) ? Progress towards OT goals: Progressing toward goals ? ?Acute Rehab OT Goals ?Patient Stated Goal: get stronger to progress toward use of prosthesis ?OT Goal Formulation: With patient ?Time For Goal Achievement: 08/24/21 ?Potential to Achieve Goals: Good  ?Plan Discharge plan remains appropriate   ? ?Co-evaluation ? ? ?   ?  ?  ?OT goals addressed during session: ADL's and self-care ?  ? ?  ?AM-PAC OT "6 Clicks" Daily Activity     ?Outcome Measure ? ? Help from another person eating meals?: None ?Help from  another person taking care of personal grooming?: A Little ?Help from another person toileting, which includes using toliet, bedpan, or urinal?: Total ?Help from another person bathing (including washing, rinsing, drying)?: A Little ?Help from another person to put on and taking off regular upper body clothing?: A Little ?Help from another person to put on and taking off regular lower body clothing?: A Little ?6 Click Score: 17 ? ?  ?End of Session Equipment Utilized During Treatment: Rolling walker (2 wheels) (stedy) ? ?OT Visit Diagnosis: Other abnormalities of gait and mobility (R26.89);Muscle weakness (generalized) (M62.81) ?  ?Activity Tolerance Patient tolerated treatment well ?  ?Patient Left in chair;with chair alarm set ?  ?Nurse Communication Mobility status ?  ? ?   ? ?Time: [id number] ?OT Time Calculation (min): 31 min ? ?Charges: OT General Charges ?$OT Visit: 1  Visit ?OT Treatments ?$Self Care/Home Management : 8-22 mins ?$Therapeutic Activity: 8-22 mins ? ?Grayce Budden, OTR/L ?Acute Care Rehab Services  ?Office [phone] ?Pager: [phone]  ? ?Maya Arcand L Isadore Palecek ?08/11/2021, 4:11 PM ?

## 2021-08-11 NOTE — [place] FL2 (Signed)
?[place] MEDICAID FL2 LEVEL OF CARE SCREENING TOOL  ?  ? ?IDENTIFICATION  ?Patient Name: ?Thomas Frye Birthdate: Feb 12, 1956 Sex: male Admission Date (Current Location): ?08/09/2021  ?[place] Number: ? [place] ?  Facility and Address:  ?[facility],  [address], [place] [address] ?     Provider Number: ?[id number]  ?Attending Physician Name and Address:  ?Alwyn Ren, MD ? Relative Name and Phone Number:  ?  ?   ?Current Level of Care: ?Hospital Recommended Level of Care: ?Skilled Nursing Facility Prior Approval Number: ?  ? ?Date Approved/Denied: ?  PASRR Number: ?  ? ?Discharge Plan: ?SNF ?  ? ?Current Diagnoses: ?Patient Active Problem List  ? Diagnosis Date Noted  ? Stroke-like symptom 08/10/2021  ? Acute CVA (cerebrovascular accident) (HCC) 08/10/2021  ? Focal neurological deficit 08/09/2021  ? Osteomyelitis of left tibia [facility])   ? Cellulitis of left lower extremity without foot 06/10/2021  ? Type 2 diabetes mellitus with hyperglycemia (HCC) 06/10/2021  ? Pancreatic insufficiency 06/10/2021  ? 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 DKA (diabetic ketoacidoses) 10/06/2015  ? Hyperlipidemia 10/06/2015  ? GERD (gastroesophageal reflux disease) 10/06/2015  ? AKI (acute kidney injury) (HCC) 10/06/2015  ? OSA (obstructive sleep apnea) 10/06/2015  ? Acute renal insufficiency   ? Nausea & vomiting 12/11/2012  ? 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.2) 10/05/2012  ? 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 Anoxic encephalopathy (HCC) 08/30/2012  ? ? 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 Acute respiratory failure in setting of profound hypoglycemia 08/18/2012  ? Severe diabetic hypoglycemia (HCC) 08/18/2012  ? DM (diabetes mellitus), type 2 with peripheral vascular complications (HCC) 08/18/2012  ? Acute encephalopathy 08/18/2012  ? HTN  (hypertension) 08/18/2012  ? Noncompliance 08/18/2012  ? ? ?Orientation RESPIRATION BLADDER Height & Weight   ?  ?Self, Time, Situation, Place ? Normal Incontinent Weight: 134.3 kg ?Height:  6\' 3"  (190.5 cm)  ?BEHAVIORAL SYMPTOMS/MOOD NEUROLOGICAL BOWEL NUTRITION STATUS  ?    Continent Diet  ?AMBULATORY STATUS COMMUNICATION OF NEEDS Skin   ?Extensive Assist Verbally Other (Comment) (L BKA stump infection) ?  ?  ?  ?    ?     ?     ? ? ?Personal Care Assistance Level of Assistance  ?Bathing, Feeding, Dressing Bathing Assistance: Limited assistance ?Feeding assistance: Independent ?Dressing Assistance: Limited assistance ?   ? ?Functional Limitations Info  ?Speech, Hearing, Sight Sight Info: Impaired ?Hearing Info: Adequate ?Speech Info: Impaired (aphasia)  ? ? ?SPECIAL CARE FACTORS FREQUENCY  ?PT (By licensed PT), OT (By licensed OT)   ?  ?PT Frequency: 5 x weekly ?OT Frequency: 5 x weekly ?  ?  ?  ?   ? ? ?Contractures Contractures Info: Not present  ? ? ?Additional Factors Info  ?Code Status, Allergies Code Status Info: Full ?Allergies Info: Latex, Sulfa Antibiotics ?  ?  ?  ?   ? ?Current Medications (08/11/2021):  This is the current hospital active medication list ?Current Facility-Administered Medications  ?Medication Dose Route Frequency Provider Last Rate Last Admin  ?  stroke: early stages of recovery book   Does not apply Once 08/13/2021, MD      ? 0.9 %  sodium chloride infusion   Intravenous Continuous Therisa Doyne, MD 75 mL/hr at 08/11/21 0623 Infusion Verify at 08/11/21 08/13/21  ? acetaminophen (TYLENOL) tablet  650 mg  650 mg Oral Q4H PRN Therisa Doyne, MD      ? Or  ? acetaminophen (TYLENOL) 160 MG/5ML solution 650 mg  650 mg Per Tube Q4H PRN Doutova, Anastassia, MD      ? Or  ? acetaminophen (TYLENOL) suppository 650 mg  650 mg Rectal Q4H PRN Doutova, Anastassia, MD      ? aspirin EC tablet 81 mg  81 mg Oral Daily Doutova, Anastassia, MD   81 mg at 08/11/21 1019  ? atorvastatin  (LIPITOR) tablet 40 mg  40 mg Oral QHS Marvel Plan, MD      ? Melene Muller ON 08/13/2021] doxycycline (VIBRA-TABS) tablet 100 mg  100 mg Oral Q12H Alwyn Ren, MD      ? feeding supplement (ENSURE ENLIVE / ENSURE PLUS) liquid 237 mL  237 mL Oral BID BM Alwyn Ren, MD      ? ferrous sulfate tablet 325 mg  325 mg Oral Q breakfast Therisa Doyne, MD   325 mg at 08/10/21 0850  ? insulin aspart (novoLOG) injection 0-6 Units  0-6 Units Subcutaneous Q4H Therisa Doyne, MD   1 Units at 08/11/21 0828  ? insulin glargine-yfgn (SEMGLEE) injection 20 Units  20 Units Subcutaneous Daily Alwyn Ren, MD      ? lipase/protease/amylase (CREON) capsule 12,000 Units  12,000 Units Oral Daily PRN Esaw Grandchild A, DO      ? lipase/protease/amylase (CREON) capsule 24,000 Units  24,000 Units Oral TID AC Alwyn Ren, MD      ? lisinopril (ZESTRIL) tablet 20 mg  20 mg Oral Daily Doutova, Anastassia, MD   20 mg at 08/11/21 1010  ? multivitamin with minerals tablet 1 tablet  1 tablet Oral Daily Alwyn Ren, MD      ? pantoprazole (PROTONIX) EC tablet 40 mg  40 mg Oral Daily Therisa Doyne, MD   40 mg at 08/11/21 1010  ? pramipexole (MIRAPEX) tablet 0.125 mg  0.125 mg Oral QHS Doutova, Anastassia, MD   0.125 mg at 08/10/21 0121  ? QUEtiapine (SEROQUEL) tablet 50 mg  50 mg Oral QHS Doutova, Anastassia, MD   50 mg at 08/10/21 0121  ? ticagrelor (BRILINTA) tablet 90 mg  90 mg Oral BID Marvel Plan, MD   90 mg at 08/11/21 1019  ? vancomycin (VANCOCIN) IVPB 1000 mg/200 mL premix  1,000 mg Intravenous Q12H Alwyn Ren, MD 200 mL/hr at 08/11/21 1014 1,000 mg at 08/11/21 1014  ? venlafaxine XR (EFFEXOR-XR) 24 hr capsule 150 mg  150 mg Oral Q breakfast Doutova, Anastassia, MD   150 mg at 08/10/21 0851  ? ? ? ?Discharge Medications: ?Please see discharge summary for a list of discharge medications. ? ?Relevant Imaging Results: ? ?Relevant Lab Results: ? ? ?Additional Information ?SSN:  [id number]. Pt is vaccinated for covid with one or two boosters. ? ?Analie Katzman, Meriam Sprague, RN ? ? ? ? ?

## 2021-08-11 NOTE — Progress Notes (Signed)
24 hour chart audit completed 

## 2021-08-11 NOTE — Progress Notes (Signed)
?PROGRESS NOTE ? ? ? ?Thomas Frye  [id number] DOB: 05-12-1955 DOA: 08/09/2021 ?PCP: [name], MD  ? ?Brief Narrative: 66 y.o. male with medical history significant of prior CVA on Plavix, HTN  HLD, DM2, sp Left bKA with stump infection on vancomycin sp loop monitor.    ?Presented with  slurred speech and right facial droop x 3 days.   ?Hx of prior CVA. ?Coming from SNF where he's been on IV vancomycin for Left BKA stump infection.  ? ?Assessment & Plan: ?  ?Principal Problem: ?  Focal neurological deficit ?Active Problems: ?  Severe diabetic hypoglycemia ([place]) ?  DM (diabetes mellitus), type 2 with peripheral vascular complications ([place]) ?  HTN (hypertension) ?  Hyperlipidemia ?  GERD (gastroesophageal reflux disease) ?  Type 2 diabetes mellitus with hyperglycemia (HCC) ?  Infection of left below knee amputation ([place]) ?  Stroke-like symptom ?  Acute CVA (cerebrovascular accident) ([place]) ? ?Slurred speech, right facial droop -concerning for stroke.  Prior history of stroke.  MRI brain showed a small acute infarct of the right precentral gyrus with no hemorrhage or mass effect, multiple old infarcts and findings of chronic ischemic microangiopathy. ?2D Echo EF 60 to 65% ?LE venous Doppler no DVT ?No afib on loop recorder- ?Aspirin 81 mg daily with Brilinta 90 mg twice daily for 1 month then Plavix alone.  ?Seen by physical therapy recommending SNF. ?He passed bedside swallow evaluation and diet has been started today. ?Left BKA stump infection -last dose of vancomycin 08/12/2021.   ?  ?Recurrent episodes of hypoglycemia -CBG (last 3)  ?Recent Labs  ?  08/11/21 ?0803 08/11/21 ?1153 08/11/21 ?1611  ?GLUCAP 182* 223* 351*  ? ?  ?Type 2 diabetes with vascular complications -Carb modified diet ?Restart long acting insulin ?  ?Hypertension  -continue home lisinopril ?  ?Hyperlipidemia -continue Lipitor ?  ?GERD -continue PPI ?  ?Restless leg syndrome -Mirapex ?  ?Depression -Afaxin, Seroquel ?  ?Pancreatic  insufficiency -Creon ? ? 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 (Active)  ?08/18/12 1404  ?Location: Foot  ?Location Orientation: Right  ?Staging: Stage IV - Full thickness tissue loss with exposed bone, tendon or muscle.  ?Wound Description (Comments):   ?Present on Admission: Yes  ? ? ? ? ?Nutrition Problem: Increased nutrient needs ?Etiology: acute illness (left BKA infection) ? ? ? ? ?Signs/Symptoms: estimated needs ? ? ? ?Interventions: Ensure Enlive (each supplement provides 350kcal and 20 grams of protein), MVI ? ?Estimated body mass index is 37 kg/m? as calculated from the following: ?  Height as of this encounter: 6\' 3"  (1.905 m). ?  Weight as of this encounter: 134.3 kg. ? ?DVT prophylaxis:  ?Code Status: full ?Family Communication:none ?Disposition Plan:  Status is: Inpatient ?Remains inpatient appropriate because: snf ?  ?Consultants:  ?neuro ? ?Procedures: none ?Antimicrobials vanco ? ?Subjective: ?Resting in bed no new c/o ? ?Objective: ?Vitals:  ? 08/11/21 0411 08/11/21 0800 08/11/21 1200 08/11/21 1600  ?BP: (!) 173/90 (!) 166/94 (!) 162/90 125/78  ?Pulse: 82 84 83 100  ?Resp: 18 18 18 18   ?Temp: 97.8 ?F (36.6 ?C) 97.8 ?F (36.6 ?C) 97.9 ?F (36.6 ?C) 98.3 ?F (36.8 ?C)  ?TempSrc: Oral Oral Oral Oral  ?SpO2: 94% 92% 96% 94%  ?Weight:      ?Height:      ? ? ?Intake/Output Summary (Last 24 hours) at 08/11/2021 1622 ?Last data filed at 08/11/2021 1445 ?Gross per 24  hour  ?Intake 2259.26 ml  ?Output 1700 ml  ?Net 559.26 ml  ? ?Filed Weights  ? 08/09/21 1802  ?Weight: 134.3 kg  ? ? ?Examination:dysarthric  ? ?General exam: Appears calm and comfortable  ?Respiratory system: Clear to auscultation. Respiratory effort normal. ?Cardiovascular system: S1 & S2 heard, RRR. No JVD, murmurs, rubs, gallops or clicks. No pedal edema. ?Gastrointestinal system: Abdomen is nondistended, soft and nontender. No organomegaly or masses felt. Normal bowel sounds  heard. ?Central nervous system: Alert and oriented.  Dysarthric with right facial droop  ?extremities: Left BKA with dressings in place ?Skin: No rashes, lesions or ulcers ?Psychiatry: Judgement and insight appear normal. Mood & affect appropriate.  ? ? ? ?Data Reviewed: I have personally reviewed following labs and imaging studies ? ?CBC: ?Recent Labs  ?Lab 08/09/21 ?1800 08/10/21 ?0500  ?WBC 7.0 7.7  ?NEUTROABS 4.5  --   ?HGB 12.1* 11.9*  ?HCT 36.3* 36.7*  ?MCV 98.4 98.9  ?PLT 230 219  ? ?Basic Metabolic Panel: ?Recent Labs  ?Lab 08/09/21 ?1800 08/09/21 ?2146 08/10/21 ?0500 08/11/21 ?[id number]  ?NA 137  --  138 137  ?K 4.1  --  3.7 3.8  ?CL 101  --  102 102  ?CO2 27  --  27 27  ?GLUCOSE 85  --  165* 175*  ?BUN 16  --  14 11  ?CREATININE 1.15  --  0.87 0.71  ?CALCIUM 9.4  --  9.3 9.3  ?MG  --  2.3  --  1.9  ?PHOS  --  3.1  --   --   ? ?GFR: ?Estimated Creatinine Clearance: 139.6 mL/min (by C-G formula based on SCr of 0.71 mg/dL). ?Liver Function Tests: ?Recent Labs  ?Lab 08/09/21 ?1800 08/10/21 ?0500  ?AST 21 14*  ?ALT 12 10  ?ALKPHOS 75 70  ?BILITOT 0.9 0.5  ?PROT 7.6 6.7  ?ALBUMIN 3.9 3.6  ? ?No results for input(s): LIPASE, AMYLASE in the last 168 hours. ?No results for input(s): AMMONIA in the last 168 hours. ?Coagulation Profile: ?Recent Labs  ?Lab 08/09/21 ?1920  ?INR 0.9  ? ?Cardiac Enzymes: ?No results for input(s): CKTOTAL, CKMB, CKMBINDEX, TROPONINI in the last 168 hours. ?BNP (last 3 results) ?No results for input(s): PROBNP in the last 8760 hours. ?HbA1C: ?Recent Labs  ?  08/11/21 ?[id number]  ?HGBA1C 7.5*  ? ?CBG: ?Recent Labs  ?Lab 08/10/21 ?2358 08/11/21 ?0406 08/11/21 ?0803 08/11/21 ?1153 08/11/21 ?1611  ?GLUCAP 207* 174* 182* 223* 351*  ? ?Lipid Profile: ?Recent Labs  ?  08/10/21 ?0500  ?CHOL 129  ?HDL 49  ?[place] 65  ?TRIG 77  ?CHOLHDL 2.6  ? ?Thyroid Function Tests: ?Recent Labs  ?  08/09/21 ?2146  ?TSH 0.614  ? ?Anemia Panel: ?No results for input(s): VITAMINB12, FOLATE, FERRITIN, TIBC, IRON, RETICCTPCT  in the last 72 hours. ?Sepsis Labs: ?No results for input(s): PROCALCITON, LATICACIDVEN in the last 168 hours. ? ?No results found for this or any previous visit (from the past 240 hour(s)).  ? ? ? ? ? ?Radiology Studies: ?CT ANGIO HEAD NECK W WO CM ? ?Result Date: 08/09/2021 ?CLINICAL DATA:  TIA.  Slurred speech. EXAM: CT ANGIOGRAPHY HEAD AND NECK TECHNIQUE: Multidetector CT imaging of the head and neck was performed using the standard protocol during bolus administration of intravenous contrast. Multiplanar CT image reconstructions and MIPs were obtained to evaluate the vascular anatomy. Carotid stenosis measurements (when applicable) are obtained utilizing NASCET criteria, using the distal internal carotid diameter as the denominator. RADIATION DOSE REDUCTION:  This exam was performed according to the departmental dose-optimization program which includes automated exposure control, adjustment of the mA and/or kV according to patient size and/or use of iterative reconstruction technique. CONTRAST:  54mL OMNIPAQUE IOHEXOL 350 MG/ML SOLN COMPARISON:  None Available. FINDINGS: CT HEAD FINDINGS Brain: There is no mass, hemorrhage or extra-axial collection. There is generalized atrophy without lobar predilection. There are old bilateral frontal infarcts. There is hypoattenuation of the periventricular white matter, most commonly indicating chronic ischemic microangiopathy. Skull: The visualized skull base, calvarium and extracranial soft tissues are normal. Sinuses/Orbits: No fluid levels or advanced mucosal thickening of the visualized paranasal sinuses. No mastoid or middle ear effusion. The orbits are normal. CTA NECK FINDINGS SKELETON: There is no bony spinal canal stenosis. No lytic or blastic lesion. OTHER NECK: Normal pharynx, larynx and major salivary glands. No cervical lymphadenopathy. Unremarkable thyroid gland. UPPER CHEST: No pneumothorax or pleural effusion. No nodules or masses. AORTIC ARCH: There is  calcific atherosclerosis of the aortic arch. There is no aneurysm, dissection or hemodynamically significant stenosis of the visualized portion of the aorta. Conventional 3 vessel aortic branching pattern. The visua

## 2021-08-12 DIAGNOSIS — R29818 Other symptoms and signs involving the nervous system: Secondary | ICD-10-CM | POA: Diagnosis not present

## 2021-08-12 LAB — GLUCOSE, CAPILLARY
Glucose-Capillary: 253 mg/dL — ABNORMAL HIGH (ref 70–99)
Glucose-Capillary: 261 mg/dL — ABNORMAL HIGH (ref 70–99)
Glucose-Capillary: 271 mg/dL — ABNORMAL HIGH (ref 70–99)
Glucose-Capillary: 332 mg/dL — ABNORMAL HIGH (ref 70–99)
Glucose-Capillary: 367 mg/dL — ABNORMAL HIGH (ref 70–99)
Glucose-Capillary: 394 mg/dL — ABNORMAL HIGH (ref 70–99)

## 2021-08-12 NOTE — Progress Notes (Signed)
24 hour chart audit completed 

## 2021-08-12 NOTE — Progress Notes (Addendum)
Physical Therapy Treatment Patient Details Name: JULIE PAOLINI MRN: [id number] DOB: [birth year] Today's Date: 08/12/2021   History of Present Illness LESSLIE MCKEEHAN is a 66 y.o. male with medical history significant of prior CVA , HTN  HLD, DM2, sp Left bKA with residual limb  infection ,sp Loop monitor presents to Ed 08/09/21 from SNF with slurred speech and some word finding difficulties. MRI results:1. Small acute infarct of the right precentral gyrus. No hemorrhage  or mass effect.  2. Multiple old infarcts and findings of chronic ischemic  microangiopathy.    PT Comments    The patient  participated in bed mobility and standing activities  in [place] today. Patient able to pull self to stand x 2 with +2 for safety and stood x  ~ 2  minutes each time.  Patient having BM so assisted back into bed for hygiene. Patient will benefit from continued REhab.  Patient reports that he is fatigued .   Recommendations for follow up therapy are one component of a multi-disciplinary discharge planning process, led by the attending physician.  Recommendations may be updated based on patient status, additional functional criteria and insurance authorization.  Follow Up Recommendations  Skilled nursing-short term rehab (<3 hours/day)     Assistance Recommended at Discharge Intermittent Supervision/Assistance  Patient can return home with the following A little help with walking and/or transfers;A little help with bathing/dressing/bathroom;Help with stairs or ramp for entrance;Assistance with cooking/housework;Assist for transportation   Equipment Recommendations  None recommended by PT    Recommendations for Other Services       Precautions / Restrictions Precautions Precautions: Fall Precaution Comments: L BKA, monitor vitals,     Mobility  Bed Mobility   Bed Mobility: Rolling Rolling: Supervision   Supine to sit: Min guard Sit to supine: Min guard   General bed mobility  comments: patient close to bed edge so provided close  min guard when rolled and sat upright. patient rolls with use of bed rails to each side.    Transfers Overall transfer level: Needs assistance     Sit to Stand: Mod assist, +2 safety/equipment, From elevated surface, +2 physical assistance           General transfer comment: Mod x 2 to stand in [place] x 2. patient having BM so assisted back to bed to be cleaned up as patient was fatigued after standing x 2.    Ambulation/Gait                   Stairs             Wheelchair Mobility    Modified Rankin (Stroke Patients Only)       Balance Overall balance assessment: Needs assistance Sitting-balance support: No upper extremity supported, Feet supported Sitting balance-Leahy Scale: Good     Standing balance support: During functional activity, Reliant on assistive device for balance Standing balance-Leahy Scale: Poor Standing balance comment: does stand at ST[place], trunk slightly flexed forward, very close guard due to patient height                            Cognition Arousal/Alertness: Awake/alert Behavior During Therapy: Flat affect Overall Cognitive Status: Within Functional Limits for tasks assessed                                 General Comments: appreciative  of assistnace given  to him        Exercises      General Comments        Pertinent Vitals/Pain Pain Assessment Pain Assessment: No/denies pain    Home Living                          Prior Function            PT Goals (current goals can now be found in the care plan section) Progress towards PT goals: Progressing toward goals    Frequency    Min 2X/week      PT Plan Current plan remains appropriate    Co-evaluation              AM-PAC PT "6 Clicks" Mobility   Outcome Measure  Help needed turning from your back to your side while in a flat bed without using bedrails?:  A Little Help needed moving from lying on your back to sitting on the side of a flat bed without using bedrails?: A Little Help needed moving to and from a bed to a chair (including a wheelchair)?: A Lot Help needed standing up from a chair using your arms (e.g., wheelchair or bedside chair)?: A Lot Help needed to walk in hospital room?: Total Help needed climbing 3-5 steps with a railing? : Total 6 Click Score: 12    End of Session Equipment Utilized During Treatment: Gait belt Activity Tolerance: Patient limited by fatigue Patient left: in bed;with call bell/phone within reach;with bed alarm set Nurse Communication: Mobility status;Need for lift equipment PT Visit Diagnosis: Muscle weakness (generalized) (M62.81)     Time: 0962-8366 PT Time Calculation (min) (ACUTE ONLY): 24 min  Charges:  $Therapeutic Activity: 23-37 mins                     Blanchard Kelch PT Acute Rehabilitation Services Pager [phone] Office [phone]    Rada Hay 08/12/2021, 12:28 PM

## 2021-08-12 NOTE — TOC Progression Note (Signed)
Transition of Care [facility]) - Progression Note    Patient Details  Name: Thomas Frye MRN: [id number] Date of Birth: 03-Dec-1955  Transition of Care [facility] CM/SW Contact  Shauntia Levengood, Meriam Sprague, RN Phone Number: 08/12/2021, 2:03 PM  Clinical Narrative:    [facility] requested additional clinicals for SNF auth. Clinicals uploaded as requested.   Expected Discharge Plan: Skilled Nursing Facility Barriers to Discharge: Continued Medical Work up  Expected Discharge Plan and Services Expected Discharge Plan: Skilled Nursing Facility   Discharge Planning Services: CM Consult   Living arrangements for the past 2 months: Skilled Nursing Facility, Single Family Home                     Readmission Risk Interventions    08/11/2021    1:04 PM  Readmission Risk Prevention Plan  Transportation Screening Complete  PCP or Specialist Appt within 5-7 Days Complete  Home Care Screening Complete  Medication Review (RN CM) Complete

## 2021-08-12 NOTE — Progress Notes (Signed)
Pharmacy Antibiotic Note  Thomas Frye is a 66 y.o. male admitted on 08/09/2021 with  past medical history of right thalamic, left parietal white matter strokes with no residual deficits, diabetes with complications such as neuropathy and prior history of DKA, sleep apnea, recent left BKA stump infection getting vancomycin through PICC line-presenting to the emergency room for evaluation of speech deficits.  Pharmacy consulted for vancomycin dosing for cellulitis.  Pt's home dose vancomycin 1gm IV q12h, LD 5/15 @ 0800  Plan: Continue vancomycin 1gm IV q12h through this evening to complete 6 week course Doxycycline 100 mg orally every 12 hours to begin tomorrow per provider for 4 weeks Pharmacy will sign off vancomycin consult and continue to monitor clinical progress  Height: 6\' 3"  (190.5 cm) Weight: 134.3 kg (296 lb) IBW/kg (Calculated) : 84.5  Temp (24hrs), Avg:97.8 F (36.6 C), Min:97.5 F (36.4 C), Max:98.3 F (36.8 C)  Recent Labs  Lab 08/09/21 1800 08/10/21 0500 08/11/21 0546  WBC 7.0 7.7  --   CREATININE 1.15 0.87 0.71     Estimated Creatinine Clearance: 139.6 mL/min (by C-G formula based on SCr of 0.71 mg/dL).    Allergies  Allergen Reactions   Latex Swelling   Sulfa Antibiotics Hives    Thank you for allowing pharmacy to be a part of this patient's care.  Royetta Asal, PharmD, BCPS Clinical Pharmacist Grandfather Please utilize Amion for appropriate phone number to reach the unit pharmacist ([place]) 08/12/2021 9:00 AM

## 2021-08-12 NOTE — Progress Notes (Signed)
PROGRESS NOTE    Thomas Frye  [id number] DOB: 1955/09/30 DOA: 08/09/2021 PCP: [name], MD   Brief Narrative: 66 y.o. male with medical history significant of prior CVA on Plavix, HTN  HLD, DM2, sp Left bKA with stump infection on vancomycin sp loop monitor.    Presented with  slurred speech and right facial droop x 3 days.   Hx of prior CVA. Coming from SNF where he's been on IV vancomycin for Left BKA stump infection.   Assessment & Plan:   Principal Problem:   Focal neurological deficit Active Problems:   Severe diabetic hypoglycemia (HCC)   DM (diabetes mellitus), type 2 with peripheral vascular complications (HCC)   HTN (hypertension)   Hyperlipidemia   GERD (gastroesophageal reflux disease)   Type 2 diabetes mellitus with hyperglycemia (HCC)   Infection of left below knee amputation (HCC)   Stroke-like symptom   Acute CVA (cerebrovascular accident) ([place])  Slurred speech, right facial droop -concerning for stroke.  Prior history of stroke.  MRI brain showed a small acute infarct of the right precentral gyrus with no hemorrhage or mass effect, multiple old infarcts and findings of chronic ischemic microangiopathy. 2D Echo EF 60 to 65% LE venous Doppler no DVT No afib on loop recorder- Aspirin 81 mg daily with Brilinta 90 mg twice daily for 1 month then Plavix alone.  Seen by physical therapy recommending SNF. He passed bedside swallow evaluation and diet has been started today. Left BKA stump infection -last dose of vancomycin 08/12/2021 to finish a course of 6 weeks.  Doxycycline 100 mg twice a day to begin to finish a course of 4 weeks.   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  Wound Description (Comments):   Present on Admission: Yes      Nutrition Problem: Increased nutrient needs Etiology: acute illness (left BKA infection)     Signs/Symptoms: estimated needs    Interventions: Ensure Enlive (each supplement provides 350kcal and 20 grams of protein), MVI  Estimated body mass index is 37 kg/m as calculated from the following:   Height as of this encounter: 6\' 3"  (1.905 m).   Weight as of this encounter: 134.3 kg.  DVT prophylaxis:  Code Status: full Family Communication:none Disposition Plan:  Status is: Inpatient Remains inpatient appropriate because: snf   Consultants:  neuro  Procedures: none Antimicrobials vanco  Subjective:  Patient is resting in bed he has no new complaints  Objective: Vitals:   08/11/21 2015 08/12/21 0153 08/12/21 0357 08/12/21 1210  BP: (!) 184/91 (!) 172/78 (!) 190/77 (!) 160/76  Pulse: 89 79 77 90  Resp: 18 18 18 17   Temp: (!) 97.5 F (36.4 C) (!) 97.5 F (36.4 C) 97.7 F (36.5 C) 98.2 F (36.8 C)  TempSrc: Oral Oral Oral Oral  SpO2: 97% 92% 91% 93%  Weight:  Height:        Intake/Output Summary (Last 24 hours) at 08/12/2021 1520 Last data filed at 08/12/2021 1451 Gross per 24 hour  Intake 2482.69 ml  Output 4150 ml  Net -1667.31 ml    Filed Weights   08/09/21 1802  Weight: 134.3 kg    Examination:dysarthric   General exam: Appears calm and comfortable  Respiratory system: Clear to auscultation. Respiratory effort normal. Cardiovascular system: S1 & S2 heard, RRR. No JVD, murmurs, rubs, gallops or clicks.  No pedal edema. Gastrointestinal system: Abdomen is nondistended, soft and nontender. No organomegaly or masses felt. Normal bowel sounds heard. Central nervous system: Alert and oriented.  Dysarthric with right facial droop  extremities: Left BKA with dressings in place Skin: No rashes, lesions or ulcers Psychiatry: Judgement and insight appear normal. Mood & affect appropriate.     Data Reviewed: I have personally reviewed following labs and imaging studies  CBC: Recent Labs  Lab 08/09/21 1800 08/10/21 0500  WBC 7.0 7.7  NEUTROABS 4.5  --   HGB 12.1* 11.9*  HCT 36.3* 36.7*  MCV 98.4 98.9  PLT 230 [id number]    Basic Metabolic Panel: Recent Labs  Lab 08/09/21 1800 08/09/21 2146 08/10/21 0500 08/11/21 0546  NA 137  --  138 137  K 4.1  --  3.7 3.8  CL 101  --  102 102  CO2 27  --  27 27  GLUCOSE 85  --  165* 175*  BUN 16  --  14 11  CREATININE 1.15  --  0.87 0.71  CALCIUM 9.4  --  9.3 9.3  MG  --  2.3  --  1.9  PHOS  --  3.1  --   --     GFR: Estimated Creatinine Clearance: 139.6 mL/min (by C-G formula based on SCr of 0.71 mg/dL). Liver Function Tests: Recent Labs  Lab 08/09/21 1800 08/10/21 0500  AST 21 14*  ALT 12 10  ALKPHOS 75 70  BILITOT 0.9 0.5  PROT 7.6 6.7  ALBUMIN 3.9 3.6    No results for input(s): LIPASE, AMYLASE in the last 168 hours. No results for input(s): AMMONIA in the last 168 hours. Coagulation Profile: Recent Labs  Lab 08/09/21 1920  INR 0.9    Cardiac Enzymes: No results for input(s): CKTOTAL, CKMB, CKMBINDEX, TROPONINI in the last 168 hours. BNP (last 3 results) No results for input(s): PROBNP in the last 8760 hours. HbA1C: Recent Labs    08/11/21 0546  HGBA1C 7.5*    CBG: Recent Labs  Lab 08/11/21 2017 08/11/21 2346 08/12/21 0358 08/12/21 0753 08/12/21 1208  GLUCAP 417* 349* 261* 253* 332*    Lipid Profile: Recent Labs    08/10/21 0500  CHOL 129  HDL 49  LDLCALC 65  TRIG 77  CHOLHDL 2.6    Thyroid  Function Tests: Recent Labs    08/09/21 2146  TSH 0.614    Anemia Panel: No results for input(s): VITAMINB12, FOLATE, FERRITIN, TIBC, IRON, RETICCTPCT in the last 72 hours. Sepsis Labs: No results for input(s): PROCALCITON, LATICACIDVEN in the last 168 hours.  No results found for this or any previous visit (from the past 240 hour(s)).       Radiology Studies: No results found.      Scheduled Meds:   stroke: early stages of recovery book   Does not apply Once   aspirin EC  81 mg Oral Daily   atorvastatin  40 mg Oral QHS   [START ON 08/13/2021]  doxycycline  100 mg Oral Q12H   feeding supplement  237 mL Oral BID BM   ferrous sulfate  325 mg Oral Q breakfast   insulin aspart  0-6 Units Subcutaneous Q4H   insulin glargine-yfgn  20 Units Subcutaneous Daily   lipase/protease/amylase  24,000 Units Oral TID AC   lisinopril  20 mg Oral Daily   multivitamin with minerals  1 tablet Oral Daily   pantoprazole  40 mg Oral Daily   pramipexole  0.125 mg Oral QHS   QUEtiapine  50 mg Oral QHS   ticagrelor  90 mg Oral BID   venlafaxine XR  150 mg Oral Q breakfast   Continuous Infusions:  sodium chloride Stopped (08/12/21 1040)   vancomycin 1,000 mg (08/12/21 1040)     LOS: 2 days    Time spent: 35 minutes    Georgette Shell, MD 08/12/2021, 3:20 PM

## 2021-08-12 NOTE — Progress Notes (Signed)
Inpatient Diabetes Program Recommendations  AACE/ADA: New Consensus Statement on Inpatient Glycemic Control (2015)  Target Ranges:  Prepandial:   less than 140 mg/dL      Peak postprandial:   less than 180 mg/dL (1-2 hours)      Critically ill patients:  140 - 180 mg/dL   Lab Results  Component Value Date   GLUCAP 253 (H) 08/12/2021   HGBA1C 7.5 (H) 08/11/2021    Review of Glycemic Control  Diabetes history: DM2 Outpatient Diabetes medications: Tresiba 54 units QD, Humalog s/s Current orders for Inpatient glycemic control: Semglee 20 QD, Novolog 0-6 units Q4H  Inpatient Diabetes Program Recommendations:    Increase Semglee to 25 units QD  Change Novolog 0-6 units Q4H to TID with meals and 0-5 HS  Add meal coverage if eating > 50% meal - Novolog 5 units TID with meals  Will likely need daily titration of both basal and bolus insulin.  Follow glucose trends.   Thank you. Ailene Ards, RD, LDN, CDE Inpatient Diabetes Coordinator [phone]

## 2021-08-13 DIAGNOSIS — R29818 Other symptoms and signs involving the nervous system: Secondary | ICD-10-CM | POA: Diagnosis not present

## 2021-08-13 LAB — GLUCOSE, CAPILLARY
Glucose-Capillary: 227 mg/dL — ABNORMAL HIGH (ref 70–99)
Glucose-Capillary: 259 mg/dL — ABNORMAL HIGH (ref 70–99)
Glucose-Capillary: 294 mg/dL — ABNORMAL HIGH (ref 70–99)

## 2021-08-13 MED ORDER — DOXYCYCLINE HYCLATE 100 MG PO TABS: 100.0000 mg | ORAL_TABLET | Freq: Two times a day (BID) | ORAL | 0 refills | Status: DC

## 2021-08-13 MED ORDER — ASPIRIN 81 MG PO TBEC: 81.0000 mg | DELAYED_RELEASE_TABLET | Freq: Every day | ORAL | 0 refills | Status: DC

## 2021-08-13 MED ORDER — ADULT MULTIVITAMIN W/MINERALS CH
1.0000 | ORAL_TABLET | Freq: Every day | ORAL | Status: AC
Start: 2021-08-14 — End: ?

## 2021-08-13 MED ORDER — TICAGRELOR 90 MG PO TABS: 90.0000 mg | ORAL_TABLET | Freq: Two times a day (BID) | ORAL | 0 refills | Status: DC

## 2021-08-13 NOTE — Care Management Important Message (Signed)
Important Message  Patient Details IM Letter placed in Patients room. Name: Thomas Frye MRN: [id number] Date of Birth: 06/05/1955   Medicare Important Message Given:  Yes     Caren Macadam 08/13/2021, 10:42 AM

## 2021-08-13 NOTE — Progress Notes (Signed)
Writer called [facility], report given to [place], [employer].

## 2021-08-13 NOTE — TOC Transition Note (Signed)
Transition of Care [facility]) - CM/SW Discharge Note   Patient Details  Name: Thomas Frye MRN: [id number] Date of Birth: [birth year]  Transition of Care [facility]) CM/SW Contact:  Bartholome Bill, RN Phone Number: 08/13/2021, 2:04 PM   Clinical Narrative:    Received SNF auth approval from [facility] [id number] 5/18-5/22. Pt to dc [facility] today. He will transport via [facility]. RN to call report to [phone].   Final next level of care: Skilled Nursing Facility Barriers to Discharge: Continued Medical Work up   Patient Goals and CMS Choice Patient states their goals for this hospitalization and ongoing recovery are:: To go back o [facility] and Services   Discharge Planning Services: CM Consult               Readmission Risk Interventions    08/11/2021    1:04 PM  Readmission Risk Prevention Plan  Transportation Screening Complete  PCP or Specialist Appt within 5-7 Days Complete  Home Care Screening Complete  Medication Review (RN CM) Complete

## 2021-08-13 NOTE — Discharge Summary (Signed)
Physician Discharge Summary  Thomas Frye [id number] DOB: June 28, 1955 DOA: 08/09/2021  PCP: [name], MD  Admit date: 08/09/2021 Discharge date: 08/13/2021  Admitted From: Nursing home Disposition: Nursing home  Recommendations for Outpatient Follow-up:  Follow up with PCP in 1-2 weeks Please obtain BMP/CBC in one week Please note that doxycycline was started on 08/13/2021 to be continued for 4 weeks Please also note that he is on aspirin and Brilinta for 4 weeks and then he needs to continue only on Plavix  Home Health: None Equipment/Devices: None  Discharge Condition: Stable CODE STATUS: Full code Diet recommendation: Cardiac carb modified Brief/Interim Summary:66 y.o. male with medical history significant of prior CVA on Plavix, HTN  HLD, DM2, sp Left bKA with stump infection on vancomycin sp loop monitor.    Presented with  slurred speech and right facial droop x 3 days.   Hx of prior CVA.  Discharge Diagnoses:  Principal Problem:   Focal neurological deficit Active Problems:   Severe diabetic hypoglycemia (HCC)   DM (diabetes mellitus), type 2 with peripheral vascular complications (HCC)   HTN (hypertension)   Hyperlipidemia   GERD (gastroesophageal reflux disease)   Type 2 diabetes mellitus with hyperglycemia ([place])   Infection of left below knee amputation (HCC)   Stroke-like symptom   Acute CVA (cerebrovascular accident) ([place]) Slurred speech, right facial droop -concerning for stroke.  Prior history of stroke.  MRI brain showed a small acute infarct of the right precentral gyrus with no hemorrhage or mass effect, multiple old infarcts and findings of chronic ischemic microangiopathy. 2D Echo EF 60 to 65% LE venous Doppler no DVT No afib on loop recorder- Aspirin 81 mg daily with Brilinta 90 mg twice daily for 1 month then Plavix alone.  Seen by physical therapy recommending SNF. He passed bedside swallow evaluation and diet has been started   Left  BKA stump infection -he finished his last dose of vancomycin on 08/12/2021 for a course of 6 weeks.  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  Wound Description (Comments):   Present on Admission: Yes    Interventions: Ensure Enlive (each supplement provides 350kcal and 20 grams of protein), MVI  Estimated body mass index is 37 kg/m as calculated from the following:   Height as of this encounter: 6\' 3"  (1.905 m).   Weight as of this encounter: 134.3 kg.  Discharge Instructions  Discharge Instructions     Ambulatory referral to Neurology   Complete by: As directed    Diet - low sodium heart healthy   Complete by: As directed    Discharge wound care:   Complete by: As directed  See orders   Increase activity slowly   Complete by: As directed       Allergies as of 08/13/2021       Reactions   Latex Swelling   Sulfa Antibiotics Hives        Medication List     STOP taking these medications    clopidogrel 75 MG tablet Commonly known as: PLAVIX   vancomycin 1,000 mg in sodium chloride 0.9 % 250 mL       TAKE these medications    acetaminophen 325 MG tablet Commonly known as:  TYLENOL Take 650 mg by mouth every 6 (six) hours as needed for moderate pain.   amitriptyline 25 MG tablet Commonly known as: ELAVIL Take 25 mg by mouth at bedtime.   aspirin EC 81 MG tablet Take 1 tablet (81 mg total) by mouth daily. Swallow whole. Start taking on: Aug 14, 2021   atorvastatin 10 MG tablet Commonly known as: LIPITOR Take 10 mg by mouth at bedtime.   doxycycline 100 MG tablet Commonly known as: VIBRA-TABS Take 1 tablet (100 mg total) by mouth every 12 (twelve) hours.   ferrous sulfate 325 (65 FE) MG tablet Take 325 mg by mouth daily with breakfast.   FreeStyle Libre 14 Day Reader Kerrin Mo by Does not apply route.   insulin lispro 100 UNIT/ML cartridge Commonly known as: HUMALOG Inject into the skin 3 (three) times daily with meals. Used with insulin pump   lisinopril 20 MG tablet Commonly known as: ZESTRIL Take 20 mg by mouth daily.   loperamide 2 MG capsule Commonly known as: IMODIUM Take by mouth as needed for diarrhea or loose stools.   multivitamin with minerals Tabs tablet Take 1 tablet by mouth daily. Start taking on: Aug 14, 2021   omeprazole 40 MG capsule Commonly known as: PRILOSEC Take 40 mg by mouth 2 (two) times daily.   Omnipod DASH Pods (Gen 4) Misc USE 1 POD EVERY 2 DAYS AS NEEDED   Pancrelipase (Lip-Prot-Amyl) 4200-14200 units Cpep Take 2 capsules by mouth 3 (three) times daily. 2 capsules  with meals  1 capsule with snacks   pramipexole 0.125 MG tablet Commonly known as: MIRAPEX Take 0.125 mg by mouth at bedtime.   QUEtiapine 50 MG tablet Commonly known as: SEROQUEL Take 50 mg by mouth at bedtime.   saccharomyces boulardii 250 MG capsule Commonly known as: FLORASTOR Take 250 mg by mouth daily.   Semglee (yfgn) 100 UNIT/ML Solostar Pen Generic drug: insulin glargine Inject 54 Units into the skin daily.   ticagrelor 90 MG Tabs tablet Commonly known as: BRILINTA Take 1 tablet (90 mg total) by mouth 2 (two) times daily.    venlafaxine XR 150 MG 24 hr capsule Commonly known as: EFFEXOR-XR Take 150 mg by mouth daily with breakfast.   vitamin E 1000 UNIT capsule Take 1,000 Units by mouth in the morning and at bedtime.               Discharge Care Instructions  (From admission, onward)           Start     Ordered   08/13/21 0000  Discharge wound care:       Comments: See orders   08/13/21 1337            Follow-up Information     Quentin Mulling, MD. Schedule an appointment as soon as possible for a visit in 1 month(s).   Specialty: Neurology Contact information: [place]  [address] [place] [address] [phone]                Allergies  Allergen Reactions   Latex Swelling   Sulfa Antibiotics Hives    Consultations: Neurology   Procedures/Studies: CT ANGIO HEAD NECK W WO CM  Result Date: 08/09/2021 CLINICAL DATA:  TIA.  Slurred speech. EXAM: CT ANGIOGRAPHY HEAD AND NECK TECHNIQUE: Multidetector CT imaging of the head and neck was performed using the standard protocol during bolus administration of intravenous contrast. Multiplanar CT image reconstructions and MIPs were obtained to evaluate the vascular anatomy. Carotid stenosis measurements (when applicable) are obtained utilizing NASCET criteria, using the distal internal carotid diameter as the denominator. RADIATION DOSE REDUCTION: This exam was performed according to the departmental dose-optimization program which includes automated exposure control, adjustment of the mA and/or kV according to patient size and/or use of iterative reconstruction technique. CONTRAST:  52mL OMNIPAQUE IOHEXOL 350 MG/ML SOLN COMPARISON:  None Available. FINDINGS: CT HEAD FINDINGS Brain: There is no mass, hemorrhage or extra-axial collection. There is generalized atrophy without lobar predilection. There are old bilateral frontal infarcts. There is hypoattenuation of the periventricular white matter, most commonly indicating  chronic ischemic microangiopathy. Skull: The visualized skull base, calvarium and extracranial soft tissues are normal. Sinuses/Orbits: No fluid levels or advanced mucosal thickening of the visualized paranasal sinuses. No mastoid or middle ear effusion. The orbits are normal. CTA NECK FINDINGS SKELETON: There is no bony spinal canal stenosis. No lytic or blastic lesion. OTHER NECK: Normal pharynx, larynx and major salivary glands. No cervical lymphadenopathy. Unremarkable thyroid gland. UPPER CHEST: No pneumothorax or pleural effusion. No nodules or masses. AORTIC ARCH: There is calcific atherosclerosis of the aortic arch. There is no aneurysm, dissection or hemodynamically significant stenosis of the visualized portion of the aorta. Conventional 3 vessel aortic branching pattern. The visualized proximal subclavian arteries are widely patent. RIGHT CAROTID SYSTEM: Normal without aneurysm, dissection or stenosis. LEFT CAROTID SYSTEM: No dissection, occlusion or aneurysm. Mild atherosclerotic calcification at the carotid bifurcation without hemodynamically significant stenosis. VERTEBRAL ARTERIES: Left dominant configuration. Both origins are clearly patent. There is no dissection, occlusion or flow-limiting stenosis to the skull base (V1-V3 segments). CTA HEAD FINDINGS POSTERIOR CIRCULATION: --Vertebral arteries: Normal V4 segments. --Inferior cerebellar arteries: Normal. --Basilar artery: Normal. --Superior cerebellar arteries: Normal. --Posterior cerebral arteries (PCA): Normal. ANTERIOR CIRCULATION: --Intracranial internal carotid arteries: Atherosclerotic calcification of the internal carotid arteries at the skull base without hemodynamically significant stenosis. --Anterior cerebral arteries (ACA): Normal. Both A1 segments are present. Patent anterior communicating artery (a-comm). --Middle cerebral arteries (MCA): Normal. VENOUS SINUSES: As permitted by contrast timing, patent. ANATOMIC VARIANTS: None Review  of the MIP images confirms the above findings. IMPRESSION: 1. No emergent large vessel occlusion or hemodynamically significant stenosis of the head or neck. 2. Old bilateral frontal infarcts and chronic ischemic microangiopathy. Aortic Atherosclerosis (ICD10-I70.0). Electronically Signed   By: Ulyses Jarred M.D.   On: 08/09/2021 19:49   CT HEAD WO CONTRAST  Result Date: 08/09/2021 CLINICAL DATA:  Altered mental status. EXAM: CT HEAD WITHOUT CONTRAST TECHNIQUE: Contiguous axial images were obtained from the base of the skull through the vertex without intravenous contrast. RADIATION DOSE REDUCTION: This exam was performed according to the departmental dose-optimization program which includes automated exposure control, adjustment of the mA and/or kV according to patient size and/or use of iterative reconstruction technique. COMPARISON:  Jul 31, 2020.  Aug 01, 2020. FINDINGS: Brain: Mild chronic ischemic white matter disease is noted old left parietal  infarction is noted. No mass effect or midline shift is noted. Ventricular size is within normal limits. There is no evidence of mass lesion, hemorrhage or acute infarction. Vascular: No hyperdense vessel or unexpected calcification. Skull: Normal. Negative for fracture or focal lesion. Sinuses/Orbits: No acute finding. Other: None. IMPRESSION: No acute intracranial abnormality seen. Electronically Signed   By: Marijo Conception M.D.   On: 08/09/2021 18:43   MR BRAIN WO CONTRAST  Result Date: 08/10/2021 CLINICAL DATA:  Slurred speech EXAM: MRI HEAD WITHOUT CONTRAST TECHNIQUE: Multiplanar, multiecho pulse sequences of the brain and surrounding structures were obtained without intravenous contrast. COMPARISON:  None Available. FINDINGS: Brain: Small acute infarct of the right precentral gyrus. No acute or chronic hemorrhage. There is confluent hyperintense T2-weighted signal within the white matter. Generalized cerebral volume loss. There are multiple old infarcts,  including within the left frontal and parietal lobes, right thalamus and within both cerebellar hemispheres. Vascular: Major flow voids are preserved. Skull and upper cervical spine: Normal calvarium and skull base. Visualized upper cervical spine and soft tissues are normal. Sinuses/Orbits:No paranasal sinus fluid levels or advanced mucosal thickening. No mastoid or middle ear effusion. Normal orbits. IMPRESSION: 1. Small acute infarct of the right precentral gyrus. No hemorrhage or mass effect. 2. Multiple old infarcts and findings of chronic ischemic microangiopathy. Electronically Signed   By: Ulyses Jarred M.D.   On: 08/10/2021 00:34   DG Chest Port 1 View  Result Date: 08/09/2021 CLINICAL DATA:  Confirm PICC line. EXAM: PORTABLE CHEST 1 VIEW COMPARISON:  Chest x-ray 06/10/2021 FINDINGS: There is no PICC line visualized. Loop recorder device overlies the heart. Cardiomediastinal silhouette is within normal limits. The lungs and costophrenic angles are clear. There is no pneumothorax or acute fracture. IMPRESSION: 1. PICC line is not visualized.  Please correlate clinically. 2. No other acute cardiopulmonary process. Electronically Signed   By: Ronney Asters M.D.   On: 08/09/2021 18:52   ECHOCARDIOGRAM COMPLETE  Result Date: 08/10/2021    ECHOCARDIOGRAM REPORT   Patient Name:   VANDERBILT PORTEE Date of Exam: 08/10/2021 Medical Rec #:  [id number]         Height:       75.0 in Accession #:    [id number]        Weight:       296.0 lb Date of Birth:  25-Nov-1955         BSA:          2.592 m Patient Age:    42 years          BP:           197/97 mmHg Patient Gender: M                 HR:           97 bpm. Exam Location:  Inpatient Procedure: 2D Echo, Cardiac Doppler and Color Doppler Indications:    Stroke  History:        Patient has prior history of Echocardiogram examinations, most                 recent 08/26/2012. Risk Factors:Hypertension and Diabetes.  Sonographer:    Jefferey Pica Referring Phys: [employer]  1. Left ventricular ejection fraction, by estimation, is 60 to 65%. The left ventricle has normal function. The left ventricle has no regional wall motion abnormalities. There is mild concentric left ventricular hypertrophy. Left ventricular diastolic parameters are consistent with Grade  I diastolic dysfunction (impaired relaxation).  2. Right ventricular systolic function is normal. The right ventricular size is normal.  3. Left atrial size was mildly dilated.  4. The mitral valve is normal in structure. Trivial mitral valve regurgitation. No evidence of mitral stenosis.  5. The aortic valve is tricuspid. Aortic valve regurgitation is not visualized. No aortic stenosis is present.  6. The inferior vena cava is normal in size with greater than 50% respiratory variability, suggesting right atrial pressure of 3 mmHg. Comparison(s): Prior images unable to be directly viewed, comparison made by report only. Conclusion(s)/Recommendation(s): No intracardiac source of embolism detected on this transthoracic study. Consider a transesophageal echocardiogram to exclude cardiac source of embolism if clinically indicated. FINDINGS  Left Ventricle: Left ventricular ejection fraction, by estimation, is 60 to 65%. The left ventricle has normal function. The left ventricle has no regional wall motion abnormalities. The left ventricular internal cavity size was normal in size. There is  mild concentric left ventricular hypertrophy. Left ventricular diastolic parameters are consistent with Grade I diastolic dysfunction (impaired relaxation). Right Ventricle: The right ventricular size is normal. No increase in right ventricular wall thickness. Right ventricular systolic function is normal. Left Atrium: Left atrial size was mildly dilated. Right Atrium: Right atrial size was normal in size. Pericardium: There is no evidence of pericardial effusion. Mitral Valve: The mitral valve is normal in structure.  Trivial mitral valve regurgitation. No evidence of mitral valve stenosis. Tricuspid Valve: The tricuspid valve is normal in structure. Tricuspid valve regurgitation is not demonstrated. No evidence of tricuspid stenosis. Aortic Valve: The aortic valve is tricuspid. Aortic valve regurgitation is not visualized. No aortic stenosis is present. Aortic valve peak gradient measures 7.5 mmHg. Pulmonic Valve: The pulmonic valve was normal in structure. Pulmonic valve regurgitation is not visualized. No evidence of pulmonic stenosis. Aorta: The aortic root is normal in size and structure. Venous: The inferior vena cava is normal in size with greater than 50% respiratory variability, suggesting right atrial pressure of 3 mmHg. IAS/Shunts: No atrial level shunt detected by color flow Doppler.  LEFT VENTRICLE PLAX 2D LVIDd:         5.20 cm   Diastology LVIDs:         2.80 cm   LV e' medial:    5.77 cm/s LV PW:         1.20 cm   LV E/e' medial:  11.2 LV IVS:        1.20 cm   LV e' lateral:   9.03 cm/s LVOT diam:     2.10 cm   LV E/e' lateral: 7.2 LV SV:         76 LV SV Index:   29 LVOT Area:     3.46 cm  RIGHT VENTRICLE RV S prime:     17.50 cm/s TAPSE (M-mode): 2.0 cm LEFT ATRIUM             Index        RIGHT ATRIUM           Index LA diam:        4.00 cm 1.54 cm/m   RA Area:     18.40 cm LA Vol (A2C):   52.6 ml 20.29 ml/m  RA Volume:   48.00 ml  18.52 ml/m LA Vol (A4C):   69.0 ml 26.62 ml/m LA Biplane Vol: 60.8 ml 23.45 ml/m  AORTIC VALVE  PULMONIC VALVE AV Area (Vmax): 2.84 cm     PV Vmax:       1.26 m/s AV Vmax:        136.50 cm/s  PV Peak grad:  6.4 mmHg AV Peak Grad:   7.5 mmHg LVOT Vmax:      112.00 cm/s LVOT Vmean:     65.900 cm/s LVOT VTI:       0.219 m  AORTA Ao Root diam: 3.70 cm Ao Asc diam:  3.30 cm MITRAL VALVE MV Area (PHT): 6.12 cm     SHUNTS MV Decel Time: 124 msec     Systemic VTI:  0.22 m MV E velocity: 64.90 cm/s   Systemic Diam: 2.10 cm MV A velocity: 104.00 cm/s MV E/A ratio:  0.62  Mihai Croitoru MD Electronically signed by Sanda Klein MD Signature Date/Time: 08/10/2021/2:46:13 PM    Final    VAS [place] LOWER EXTREMITY VENOUS (DVT)  Result Date: 08/11/2021  Lower Venous DVT Study Patient Name:  BALAM MERCEDES  Date of Exam:   08/10/2021 Medical Rec #: [id number]          Accession #:    [id number] Date of Birth: 09/03/1955          Patient Gender: M Patient Age:   26 years Exam Location:  [facility] Procedure:      VAS [place] LOWER EXTREMITY VENOUS (DVT) Referring Phys: Cornelius Moras XU --------------------------------------------------------------------------------  Indications: Stroke.  Risk Factors: None identified. Limitations: Left BKA, body habitus and poor ultrasound/tissue interface. Comparison Study: No prior studies. Performing Technologist: Oliver Hum RVT  Examination Guidelines: A complete evaluation includes B-mode imaging, spectral Doppler, color Doppler, and power Doppler as needed of all accessible portions of each vessel. Bilateral testing is considered an integral part of a complete examination. Limited examinations for reoccurring indications may be performed as noted. The reflux portion of the exam is performed with the patient in reverse Trendelenburg.  +---------+---------------+---------+-----------+----------+-------------------+ RIGHT    CompressibilityPhasicitySpontaneityPropertiesThrombus Aging      +---------+---------------+---------+-----------+----------+-------------------+ CFV      Full           Yes      Yes                                      +---------+---------------+---------+-----------+----------+-------------------+ SFJ      Full                                                             +---------+---------------+---------+-----------+----------+-------------------+ FV Prox  Full                                                              +---------+---------------+---------+-----------+----------+-------------------+ FV Mid   Full                                                             +---------+---------------+---------+-----------+----------+-------------------+  FV DistalFull                                                             +---------+---------------+---------+-----------+----------+-------------------+ PFV      Full                                                             +---------+---------------+---------+-----------+----------+-------------------+ POP      Full           Yes      Yes                                      +---------+---------------+---------+-----------+----------+-------------------+ PTV      Full                                                             +---------+---------------+---------+-----------+----------+-------------------+ PERO                                                  Not well visualized +---------+---------------+---------+-----------+----------+-------------------+   +---------+---------------+---------+-----------+----------+-------------------+ LEFT     CompressibilityPhasicitySpontaneityPropertiesThrombus Aging      +---------+---------------+---------+-----------+----------+-------------------+ CFV      Full           Yes      Yes                                      +---------+---------------+---------+-----------+----------+-------------------+ SFJ      Full                                                             +---------+---------------+---------+-----------+----------+-------------------+ FV Prox  Full                                                             +---------+---------------+---------+-----------+----------+-------------------+ FV Mid   Full                                                             +---------+---------------+---------+-----------+----------+-------------------+ FV  DistalFull                                                             +---------+---------------+---------+-----------+----------+-------------------+  PFV      Full                                                             +---------+---------------+---------+-----------+----------+-------------------+ POP      Full           Yes      Yes                                      +---------+---------------+---------+-----------+----------+-------------------+ PTV                                                   Not well visualized +---------+---------------+---------+-----------+----------+-------------------+ PERO                                                  Not well visualized +---------+---------------+---------+-----------+----------+-------------------+     Summary: RIGHT: - There is no evidence of deep vein thrombosis in the lower extremity. However, portions of this examination were limited- see technologist comments above.  - No cystic structure found in the popliteal fossa.  LEFT: - There is no evidence of deep vein thrombosis in the lower extremity. However, portions of this examination were limited- see technologist comments above.  - No cystic structure found in the popliteal fossa.  *See table(s) above for measurements and observations. Electronically signed by Harold Barban MD on 08/11/2021 at 12:07:20 AM.    Final    (Echo, Carotid, EGD, Colonoscopy, ERCP)    Subjective:  Patient is resting in bed in no acute distress no events overnight no new complaints Discharge Exam: Vitals:   08/13/21 0408 08/13/21 1333  BP: (!) 175/87 (!) 159/85  Pulse: 74 91  Resp: 14 18  Temp: 98.1 F (36.7 C) 97.7 F (36.5 C)  SpO2: 93% 93%   Vitals:   08/12/21 1947 08/13/21 0155 08/13/21 0408 08/13/21 1333  BP: (!) 163/87 (!) 162/93 (!) 175/87 (!) 159/85  Pulse: 88 80 74 91  Resp: 14 16 14 18   Temp: 97.8 F (36.6 C)  98.1 F (36.7 C) 97.7 F (36.5 C)  TempSrc:  Oral  Oral Oral  SpO2: 90% 96% 93% 93%  Weight:      Height:        General: Pt is alert, awake, not in acute distress Cardiovascular: RRR, S1/S2 +, no rubs, no gallops Respiratory: CTA bilaterally, no wheezing, no rhonchi Abdominal: Soft, NT, ND, bowel sounds + Extremities: Left foot BKA covered with a dressing   The results of significant diagnostics from this hospitalization (including imaging, microbiology, ancillary and laboratory) are listed below for reference.     Microbiology: No results found for this or any previous visit (from the past 240 hour(s)).   Labs: BNP (last 3 results) No results for input(s): BNP in the last 8760 hours. Basic Metabolic Panel: Recent Labs  Lab 08/09/21 1800 08/09/21 2146 08/10/21 0500 08/11/21 0546  NA 137  --  138 137  K 4.1  --  3.7 3.8  CL 101  --  102 102  CO2 27  --  27 27  GLUCOSE 85  --  165* 175*  BUN 16  --  14 11  CREATININE 1.15  --  0.87 0.71  CALCIUM 9.4  --  9.3 9.3  MG  --  2.3  --  1.9  PHOS  --  3.1  --   --    Liver Function Tests: Recent Labs  Lab 08/09/21 1800 08/10/21 0500  AST 21 14*  ALT 12 10  ALKPHOS 75 70  BILITOT 0.9 0.5  PROT 7.6 6.7  ALBUMIN 3.9 3.6   No results for input(s): LIPASE, AMYLASE in the last 168 hours. No results for input(s): AMMONIA in the last 168 hours. CBC: Recent Labs  Lab 08/09/21 1800 08/10/21 0500  WBC 7.0 7.7  NEUTROABS 4.5  --   HGB 12.1* 11.9*  HCT 36.3* 36.7*  MCV 98.4 98.9  PLT 230 219   Cardiac Enzymes: No results for input(s): CKTOTAL, CKMB, CKMBINDEX, TROPONINI in the last 168 hours. BNP: Invalid input(s): POCBNP CBG: Recent Labs  Lab 08/12/21 1947 08/12/21 2330 08/13/21 0409 08/13/21 0739 08/13/21 1141  GLUCAP 394* 271* 227* 259* 294*   D-Dimer No results for input(s): DDIMER in the last 72 hours. Hgb A1c Recent Labs    08/11/21 0546  HGBA1C 7.5*   Lipid Profile No results for input(s): CHOL, HDL, LDLCALC, TRIG, CHOLHDL, LDLDIRECT in  the last 72 hours. Thyroid function studies No results for input(s): TSH, T4TOTAL, T3FREE, THYROIDAB in the last 72 hours.  Invalid input(s): FREET3 Anemia work up No results for input(s): VITAMINB12, FOLATE, FERRITIN, TIBC, IRON, RETICCTPCT in the last 72 hours. Urinalysis    Component Value Date/Time   COLORURINE STRAW (A) 08/10/2021 0505   APPEARANCEUR CLEAR 08/10/2021 0505   LABSPEC 1.019 08/10/2021 0505   PHURINE 6.0 08/10/2021 0505   GLUCOSEU 50 (A) 08/10/2021 0505   HGBUR NEGATIVE 08/10/2021 0505   BILIRUBINUR NEGATIVE 08/10/2021 0505   KETONESUR NEGATIVE 08/10/2021 0505   PROTEINUR NEGATIVE 08/10/2021 0505   UROBILINOGEN 0.2 12/11/2012 0704   NITRITE NEGATIVE 08/10/2021 0505   LEUKOCYTESUR TRACE (A) 08/10/2021 0505   Sepsis Labs Invalid input(s): PROCALCITONIN,  WBC,  LACTICIDVEN Microbiology No results found for this or any previous visit (from the past 240 hour(s)).   Time coordinating discharge:  39 minutes  SIGNED:   Georgette Shell, MD  [facility] 08/13/2021, 1:40 PM

## 2021-08-13 NOTE — Progress Notes (Signed)
Inpatient Diabetes Program Recommendations  AACE/ADA: New Consensus Statement on Inpatient Glycemic Control (2015)  Target Ranges:  Prepandial:   less than 140 mg/dL      Peak postprandial:   less than 180 mg/dL (1-2 hours)      Critically ill patients:  140 - 180 mg/dL    Latest Reference Range & Units 08/11/21 23:46 08/12/21 03:58 08/12/21 07:53 08/12/21 12:08 08/12/21 17:07 08/12/21 19:47  Glucose-Capillary 70 - 99 mg/dL 944 (H)  4 units Novolog  261 (H)  3 units Novolog  253 (H)  3 units Novolog  20 units Semglee @1033   332 (H)  4 units Novolog  367 (H)  5 units Novolog  394 (H)  5 units Novolog   (H): Data is abnormally high  Latest Reference Range & Units 08/12/21 23:30 08/13/21 04:09 08/13/21 07:39  Glucose-Capillary 70 - 99 mg/dL 08/15/21 (H)  3 units Novolog  227 (H)  2 units Novolog  259 (H)  (H): Data is abnormally high     Home DM Meds: Tresiba 54 units Daily      Humalog per SSI  Current Orders: Semglee 20 units Daily     Novolog 0-6 units Q4 hours    MD- Pt eating 100% meals per Documentation.  CBGs >250.  Please consider:  1. Increase Semglee to 27 units Daily (50% home dose)  2. Change/Increase Novolog SSI to the 0-9 unit scale TID AC + HS  3. Start Novolog Meal Coverage: Novolog 4 units TID with meals HOLD if pt eats <50% meals    --Will follow patient during hospitalization--  [phone] RN, MSN, CDE Diabetes Coordinator Inpatient Glycemic Control Team Team Pager: [phone] (8a-5p)

## 2022-04-02 ENCOUNTER — Other Ambulatory Visit: Payer: Self-pay

## 2022-04-02 ENCOUNTER — Emergency Department (HOSPITAL_COMMUNITY): Payer: Medicare Other | Admitting: Anesthesiology

## 2022-04-02 ENCOUNTER — Inpatient Hospital Stay (HOSPITAL_COMMUNITY)
Admission: EM | Admit: 2022-04-02 | Discharge: 2022-04-08 | DRG: 521 | Disposition: A | Discharge status: Skilled Nursing Facility | Payer: Medicare Other | Source: Skilled Nursing Facility | Attending: Internal Medicine | Admitting: Internal Medicine

## 2022-04-02 ENCOUNTER — Emergency Department (HOSPITAL_COMMUNITY): Payer: Medicare Other

## 2022-04-02 ENCOUNTER — Encounter (HOSPITAL_COMMUNITY)
Admission: EM | Disposition: A | Discharge status: Skilled Nursing Facility | Payer: Self-pay | Source: Skilled Nursing Facility | Attending: Internal Medicine

## 2022-04-02 ENCOUNTER — Inpatient Hospital Stay (HOSPITAL_COMMUNITY): Payer: Medicare Other

## 2022-04-02 ENCOUNTER — Encounter (HOSPITAL_COMMUNITY): Payer: Self-pay

## 2022-04-02 DIAGNOSIS — S72002A Fracture of unspecified part of neck of left femur, initial encounter for closed fracture: Secondary | ICD-10-CM | POA: Diagnosis present

## 2022-04-02 DIAGNOSIS — Y92129 Unspecified place in nursing home as the place of occurrence of the external cause: Secondary | ICD-10-CM

## 2022-04-02 DIAGNOSIS — J9601 Acute respiratory failure with hypoxia: Secondary | ICD-10-CM | POA: Diagnosis not present

## 2022-04-02 DIAGNOSIS — D638 Anemia in other chronic diseases classified elsewhere: Secondary | ICD-10-CM | POA: Diagnosis present

## 2022-04-02 DIAGNOSIS — Z882 Allergy status to sulfonamides status: Secondary | ICD-10-CM

## 2022-04-02 DIAGNOSIS — E1065 Type 1 diabetes mellitus with hyperglycemia: Secondary | ICD-10-CM | POA: Diagnosis present

## 2022-04-02 DIAGNOSIS — R339 Retention of urine, unspecified: Secondary | ICD-10-CM | POA: Diagnosis not present

## 2022-04-02 DIAGNOSIS — I959 Hypotension, unspecified: Secondary | ICD-10-CM | POA: Diagnosis not present

## 2022-04-02 DIAGNOSIS — M810 Age-related osteoporosis without current pathological fracture: Secondary | ICD-10-CM | POA: Diagnosis present

## 2022-04-02 DIAGNOSIS — Z794 Long term (current) use of insulin: Secondary | ICD-10-CM

## 2022-04-02 DIAGNOSIS — J9811 Atelectasis: Secondary | ICD-10-CM | POA: Diagnosis not present

## 2022-04-02 DIAGNOSIS — F332 Major depressive disorder, recurrent severe without psychotic features: Secondary | ICD-10-CM | POA: Diagnosis present

## 2022-04-02 DIAGNOSIS — Z8673 Personal history of transient ischemic attack (TIA), and cerebral infarction without residual deficits: Secondary | ICD-10-CM | POA: Diagnosis not present

## 2022-04-02 DIAGNOSIS — Z79899 Other long term (current) drug therapy: Secondary | ICD-10-CM

## 2022-04-02 DIAGNOSIS — E1165 Type 2 diabetes mellitus with hyperglycemia: Secondary | ICD-10-CM | POA: Diagnosis present

## 2022-04-02 DIAGNOSIS — E1151 Type 2 diabetes mellitus with diabetic peripheral angiopathy without gangrene: Secondary | ICD-10-CM | POA: Diagnosis not present

## 2022-04-02 DIAGNOSIS — Z6831 Body mass index (BMI) 31.0-31.9, adult: Secondary | ICD-10-CM | POA: Diagnosis not present

## 2022-04-02 DIAGNOSIS — G5702 Lesion of sciatic nerve, left lower limb: Secondary | ICD-10-CM | POA: Diagnosis present

## 2022-04-02 DIAGNOSIS — I5032 Chronic diastolic (congestive) heart failure: Secondary | ICD-10-CM | POA: Diagnosis present

## 2022-04-02 DIAGNOSIS — G4733 Obstructive sleep apnea (adult) (pediatric): Secondary | ICD-10-CM | POA: Diagnosis present

## 2022-04-02 DIAGNOSIS — E1042 Type 1 diabetes mellitus with diabetic polyneuropathy: Secondary | ICD-10-CM | POA: Diagnosis present

## 2022-04-02 DIAGNOSIS — G4730 Sleep apnea, unspecified: Secondary | ICD-10-CM | POA: Diagnosis not present

## 2022-04-02 DIAGNOSIS — Z833 Family history of diabetes mellitus: Secondary | ICD-10-CM

## 2022-04-02 DIAGNOSIS — Z808 Family history of malignant neoplasm of other organs or systems: Secondary | ICD-10-CM

## 2022-04-02 DIAGNOSIS — E669 Obesity, unspecified: Secondary | ICD-10-CM | POA: Diagnosis present

## 2022-04-02 DIAGNOSIS — W010XXA Fall on same level from slipping, tripping and stumbling without subsequent striking against object, initial encounter: Secondary | ICD-10-CM | POA: Diagnosis present

## 2022-04-02 DIAGNOSIS — Z7401 Bed confinement status: Secondary | ICD-10-CM

## 2022-04-02 DIAGNOSIS — E10649 Type 1 diabetes mellitus with hypoglycemia without coma: Secondary | ICD-10-CM | POA: Diagnosis not present

## 2022-04-02 DIAGNOSIS — I10 Essential (primary) hypertension: Secondary | ICD-10-CM

## 2022-04-02 DIAGNOSIS — Z7902 Long term (current) use of antithrombotics/antiplatelets: Secondary | ICD-10-CM

## 2022-04-02 DIAGNOSIS — E871 Hypo-osmolality and hyponatremia: Secondary | ICD-10-CM | POA: Diagnosis present

## 2022-04-02 DIAGNOSIS — E10319 Type 1 diabetes mellitus with unspecified diabetic retinopathy without macular edema: Secondary | ICD-10-CM | POA: Diagnosis present

## 2022-04-02 DIAGNOSIS — N179 Acute kidney failure, unspecified: Secondary | ICD-10-CM | POA: Diagnosis not present

## 2022-04-02 DIAGNOSIS — I110 Hypertensive heart disease with heart failure: Secondary | ICD-10-CM | POA: Diagnosis present

## 2022-04-02 DIAGNOSIS — K219 Gastro-esophageal reflux disease without esophagitis: Secondary | ICD-10-CM | POA: Diagnosis present

## 2022-04-02 DIAGNOSIS — K8689 Other specified diseases of pancreas: Secondary | ICD-10-CM | POA: Diagnosis present

## 2022-04-02 DIAGNOSIS — E559 Vitamin D deficiency, unspecified: Secondary | ICD-10-CM | POA: Diagnosis present

## 2022-04-02 DIAGNOSIS — Z7982 Long term (current) use of aspirin: Secondary | ICD-10-CM

## 2022-04-02 DIAGNOSIS — E785 Hyperlipidemia, unspecified: Secondary | ICD-10-CM | POA: Diagnosis present

## 2022-04-02 DIAGNOSIS — Z89512 Acquired absence of left leg below knee: Secondary | ICD-10-CM | POA: Diagnosis not present

## 2022-04-02 DIAGNOSIS — Z803 Family history of malignant neoplasm of breast: Secondary | ICD-10-CM

## 2022-04-02 DIAGNOSIS — Z91040 Latex allergy status: Secondary | ICD-10-CM

## 2022-04-02 HISTORY — PX: TOTAL HIP ARTHROPLASTY: SHX124

## 2022-04-02 LAB — BASIC METABOLIC PANEL
Anion gap: 9 (ref 5–15)
BUN: 21 mg/dL (ref 8–23)
CO2: 24 mmol/L (ref 22–32)
Calcium: 8.7 mg/dL — ABNORMAL LOW (ref 8.9–10.3)
Chloride: 96 mmol/L — ABNORMAL LOW (ref 98–111)
Creatinine, Ser: 1.18 mg/dL (ref 0.61–1.24)
GFR, Estimated: >60 mL/min (ref >60)
Glucose, Bld: 330 mg/dL — ABNORMAL HIGH (ref 70–99)
Potassium: 4.4 mmol/L (ref 3.5–5.1)
Sodium: 129 mmol/L — ABNORMAL LOW (ref 135–145)

## 2022-04-02 LAB — CBC WITH DIFFERENTIAL/PLATELET
Abs Immature Granulocytes: 0.04 10*3/uL (ref 0.00–0.07)
Basophils Absolute: 0.1 10*3/uL (ref 0.0–0.1)
Basophils Relative: 1 %
Eosinophils Absolute: 0.2 10*3/uL (ref 0.0–0.5)
Eosinophils Relative: 2 %
HCT: 32.7 % — ABNORMAL LOW (ref 39.0–52.0)
Hemoglobin: 11.2 g/dL — ABNORMAL LOW (ref 13.0–17.0)
Immature Granulocytes: 0 %
Lymphocytes Relative: 11 %
Lymphs Abs: 1.1 10*3/uL (ref 0.7–4.0)
MCH: 32.6 pg (ref 26.0–34.0)
MCHC: 34.3 g/dL (ref 30.0–36.0)
MCV: 95.1 fL (ref 80.0–100.0)
Monocytes Absolute: 0.6 10*3/uL (ref 0.1–1.0)
Monocytes Relative: 6 %
Neutro Abs: 7.9 10*3/uL — ABNORMAL HIGH (ref 1.7–7.7)
Neutrophils Relative %: 80 %
Platelets: 196 10*3/uL (ref 150–400)
RBC: 3.44 MIL/uL — ABNORMAL LOW (ref 4.22–5.81)
RDW: 12.3 % (ref 11.5–15.5)
WBC: 10 10*3/uL (ref 4.0–10.5)
nRBC: 0 % (ref 0.0–0.2)

## 2022-04-02 LAB — GLUCOSE, CAPILLARY
Glucose-Capillary: 322 mg/dL — ABNORMAL HIGH (ref 70–99)
Glucose-Capillary: 248 mg/dL — ABNORMAL HIGH (ref 70–99)
Glucose-Capillary: 253 mg/dL — ABNORMAL HIGH (ref 70–99)
Glucose-Capillary: 246 mg/dL — ABNORMAL HIGH (ref 70–99)
Glucose-Capillary: 223 mg/dL — ABNORMAL HIGH (ref 70–99)

## 2022-04-02 LAB — PROTIME-INR
INR: 1 (ref 0.8–1.2)
Prothrombin Time: 12.8 seconds (ref 11.4–15.2)

## 2022-04-02 LAB — VITAMIN D 25 HYDROXY (VIT D DEFICIENCY, FRACTURES): Vit D, 25-Hydroxy: 13.25 ng/mL — ABNORMAL LOW (ref 30–100)

## 2022-04-02 LAB — LIPID PANEL
Cholesterol: 141 mg/dL (ref 0–200)
HDL: 55 mg/dL (ref >40)
LDL Cholesterol: 68 mg/dL (ref 0–99)
Total CHOL/HDL Ratio: 2.6 RATIO
Triglycerides: 89 mg/dL (ref <150)
VLDL: 18 mg/dL (ref 0–40)

## 2022-04-02 LAB — IRON AND TIBC
Iron: 38 ug/dL — ABNORMAL LOW (ref 45–182)
Saturation Ratios: 15 % — ABNORMAL LOW (ref 17.9–39.5)
TIBC: 246 ug/dL — ABNORMAL LOW (ref 250–450)
UIBC: 208 ug/dL

## 2022-04-02 LAB — TYPE AND SCREEN
ABO/RH(D): A POS
Antibody Screen: NEGATIVE

## 2022-04-02 LAB — FERRITIN: Ferritin: 259 ng/mL (ref 24–336)

## 2022-04-02 LAB — SURGICAL PCR SCREEN
MRSA, PCR: NEGATIVE
Staphylococcus aureus: POSITIVE — AB

## 2022-04-02 LAB — CBG MONITORING, ED: Glucose-Capillary: 308 mg/dL — ABNORMAL HIGH (ref 70–99)

## 2022-04-02 LAB — VITAMIN B12: Vitamin B-12: 468 pg/mL (ref 180–914)

## 2022-04-02 SURGERY — ARTHROPLASTY, HIP, TOTAL,POSTERIOR APPROACH
Anesthesia: General | Site: Hip | Laterality: Left

## 2022-04-02 MED ORDER — TRANEXAMIC ACID-NACL 1000-0.7 MG/100ML-% IV SOLN
INTRAVENOUS | Status: AC
  Filled 2022-04-02: qty 100

## 2022-04-02 MED ORDER — DEXAMETHASONE SODIUM PHOSPHATE 10 MG/ML IJ SOLN
INTRAMUSCULAR | Status: DC | PRN
  Administered 2022-04-02: 5 mg via INTRAVENOUS

## 2022-04-02 MED ORDER — CHLORHEXIDINE GLUCONATE 4 % EX LIQD: 60.0000 mL | Freq: Once | CUTANEOUS | Status: DC

## 2022-04-02 MED ORDER — ASPIRIN 81 MG PO TBEC
81.0000 mg | DELAYED_RELEASE_TABLET | Freq: Two times a day (BID) | ORAL | Status: DC
  Administered 2022-04-03 – 2022-04-08 (×11): 81 mg via ORAL
  Filled 2022-04-02 (×12): qty 1

## 2022-04-02 MED ORDER — LISINOPRIL 20 MG PO TABS
20.0000 mg | ORAL_TABLET | Freq: Every day | ORAL | Status: DC
  Administered 2022-04-02 – 2022-04-03 (×2): 20 mg via ORAL
  Filled 2022-04-02 (×2): qty 1

## 2022-04-02 MED ORDER — SODIUM CHLORIDE (PF) 0.9 % IJ SOLN
INTRAMUSCULAR | Status: DC | PRN
  Administered 2022-04-02: 60 mL via INTRAVENOUS

## 2022-04-02 MED ORDER — POVIDONE-IODINE 10 % EX SWAB
2.0000 | Freq: Once | CUTANEOUS | Status: AC
  Administered 2022-04-02: 2 via TOPICAL

## 2022-04-02 MED ORDER — HYDROMORPHONE HCL 1 MG/ML IJ SOLN: 0.2500 mg | INTRAMUSCULAR | Status: DC | PRN

## 2022-04-02 MED ORDER — AMITRIPTYLINE HCL 25 MG PO TABS
25.0000 mg | ORAL_TABLET | Freq: Every day | ORAL | Status: DC
  Administered 2022-04-03: 25 mg via ORAL
  Filled 2022-04-02 (×3): qty 1

## 2022-04-02 MED ORDER — TICAGRELOR 90 MG PO TABS
90.0000 mg | ORAL_TABLET | Freq: Two times a day (BID) | ORAL | Status: DC
  Administered 2022-04-03 – 2022-04-05 (×5): 90 mg via ORAL
  Filled 2022-04-02 (×7): qty 1

## 2022-04-02 MED ORDER — MELATONIN 5 MG PO TABS
10.0000 mg | ORAL_TABLET | Freq: Every day | ORAL | Status: DC
  Administered 2022-04-04 – 2022-04-07 (×4): 10 mg via ORAL
  Filled 2022-04-02 (×5): qty 2

## 2022-04-02 MED ORDER — TRANEXAMIC ACID-NACL 1000-0.7 MG/100ML-% IV SOLN
1000.0000 mg | INTRAVENOUS | Status: AC
  Administered 2022-04-02: 1000 mg via INTRAVENOUS

## 2022-04-02 MED ORDER — CEFAZOLIN SODIUM-DEXTROSE 2-4 GM/100ML-% IV SOLN
2.0000 g | Freq: Three times a day (TID) | INTRAVENOUS | Status: AC
  Administered 2022-04-02 – 2022-04-03 (×2): 2 g via INTRAVENOUS
  Filled 2022-04-02 (×2): qty 100

## 2022-04-02 MED ORDER — FENTANYL CITRATE (PF) 250 MCG/5ML IJ SOLN
INTRAMUSCULAR | Status: AC
  Filled 2022-04-02: qty 5

## 2022-04-02 MED ORDER — PROPOFOL 10 MG/ML IV BOLUS
INTRAVENOUS | Status: AC
  Filled 2022-04-02: qty 20

## 2022-04-02 MED ORDER — MIDAZOLAM HCL 2 MG/2ML IJ SOLN
INTRAMUSCULAR | Status: AC
  Filled 2022-04-02: qty 2

## 2022-04-02 MED ORDER — INSULIN ASPART 100 UNIT/ML IJ SOLN
0.0000 [IU] | INTRAMUSCULAR | Status: DC
  Administered 2022-04-02: 5 [IU] via SUBCUTANEOUS
  Administered 2022-04-02: 11 [IU] via SUBCUTANEOUS
  Administered 2022-04-02: 8 [IU] via SUBCUTANEOUS
  Administered 2022-04-03: 15 [IU] via SUBCUTANEOUS
  Administered 2022-04-03 (×2): 8 [IU] via SUBCUTANEOUS
  Administered 2022-04-03: 5 [IU] via SUBCUTANEOUS
  Administered 2022-04-03 (×2): 8 [IU] via SUBCUTANEOUS
  Administered 2022-04-04: 5 [IU] via SUBCUTANEOUS
  Administered 2022-04-04: 3 [IU] via SUBCUTANEOUS
  Administered 2022-04-04: 10 [IU] via SUBCUTANEOUS

## 2022-04-02 MED ORDER — ORAL CARE MOUTH RINSE: 15.0000 mL | Freq: Once | OROMUCOSAL | Status: AC

## 2022-04-02 MED ORDER — AMISULPRIDE (ANTIEMETIC) 5 MG/2ML IV SOLN: 10.0000 mg | Freq: Once | INTRAVENOUS | Status: DC | PRN

## 2022-04-02 MED ORDER — ACETAMINOPHEN 500 MG PO TABS: 1000.0000 mg | ORAL_TABLET | Freq: Once | ORAL | Status: AC

## 2022-04-02 MED ORDER — BUPIVACAINE LIPOSOME 1.3 % IJ SUSP
INTRAMUSCULAR | Status: DC | PRN
  Administered 2022-04-02: 20 mL

## 2022-04-02 MED ORDER — OXYCODONE HCL 5 MG PO TABS: 5.0000 mg | ORAL_TABLET | Freq: Four times a day (QID) | ORAL | Status: DC | PRN

## 2022-04-02 MED ORDER — ACETAMINOPHEN 10 MG/ML IV SOLN
INTRAVENOUS | Status: AC
  Filled 2022-04-02: qty 100

## 2022-04-02 MED ORDER — LOPERAMIDE HCL 2 MG PO CAPS: 2.0000 mg | ORAL_CAPSULE | ORAL | Status: DC | PRN

## 2022-04-02 MED ORDER — MEPERIDINE HCL 25 MG/ML IJ SOLN: 6.2500 mg | INTRAMUSCULAR | Status: DC | PRN

## 2022-04-02 MED ORDER — CHLORHEXIDINE GLUCONATE 0.12 % MT SOLN
OROMUCOSAL | Status: AC
  Administered 2022-04-02: 15 mL via OROMUCOSAL
  Filled 2022-04-02: qty 15

## 2022-04-02 MED ORDER — BUPIVACAINE LIPOSOME 1.3 % IJ SUSP
INTRAMUSCULAR | Status: AC
  Filled 2022-04-02: qty 20

## 2022-04-02 MED ORDER — CHLORHEXIDINE GLUCONATE 0.12 % MT SOLN: 15.0000 mL | Freq: Once | OROMUCOSAL | Status: AC

## 2022-04-02 MED ORDER — ONDANSETRON HCL 4 MG PO TABS: 4.0000 mg | ORAL_TABLET | Freq: Four times a day (QID) | ORAL | Status: DC | PRN

## 2022-04-02 MED ORDER — 0.9 % SODIUM CHLORIDE (POUR BTL) OPTIME
TOPICAL | Status: DC | PRN
  Administered 2022-04-02: 1000 mL

## 2022-04-02 MED ORDER — PANTOPRAZOLE SODIUM 40 MG PO TBEC
40.0000 mg | DELAYED_RELEASE_TABLET | Freq: Every day | ORAL | Status: DC
  Administered 2022-04-02 – 2022-04-08 (×7): 40 mg via ORAL
  Filled 2022-04-02 (×8): qty 1

## 2022-04-02 MED ORDER — OXYCODONE HCL 5 MG PO TABS
5.0000 mg | ORAL_TABLET | ORAL | Status: DC | PRN
  Administered 2022-04-03: 10 mg via ORAL
  Administered 2022-04-03: 5 mg via ORAL
  Filled 2022-04-02: qty 1
  Filled 2022-04-02: qty 2

## 2022-04-02 MED ORDER — DEXAMETHASONE SODIUM PHOSPHATE 10 MG/ML IJ SOLN
INTRAMUSCULAR | Status: AC
  Filled 2022-04-02: qty 1

## 2022-04-02 MED ORDER — PROPOFOL 10 MG/ML IV BOLUS
INTRAVENOUS | Status: DC | PRN
  Administered 2022-04-02: 100 mg via INTRAVENOUS

## 2022-04-02 MED ORDER — VENLAFAXINE HCL ER 150 MG PO CP24
150.0000 mg | ORAL_CAPSULE | Freq: Every day | ORAL | Status: DC
  Administered 2022-04-03 – 2022-04-08 (×6): 150 mg via ORAL
  Filled 2022-04-02 (×7): qty 1

## 2022-04-02 MED ORDER — PHENYLEPHRINE HCL-NACL 20-0.9 MG/250ML-% IV SOLN
INTRAVENOUS | Status: DC | PRN
  Administered 2022-04-02: 40 ug/min via INTRAVENOUS

## 2022-04-02 MED ORDER — LIDOCAINE 2% (20 MG/ML) 5 ML SYRINGE
INTRAMUSCULAR | Status: DC | PRN
  Administered 2022-04-02: 100 mg via INTRAVENOUS

## 2022-04-02 MED ORDER — PRAMIPEXOLE DIHYDROCHLORIDE 0.125 MG PO TABS
0.1250 mg | ORAL_TABLET | Freq: Every day | ORAL | Status: DC
  Administered 2022-04-02 – 2022-04-07 (×6): 0.125 mg via ORAL
  Filled 2022-04-02 (×7): qty 1

## 2022-04-02 MED ORDER — TICAGRELOR 90 MG PO TABS: 90.0000 mg | ORAL_TABLET | Freq: Every day | ORAL | Status: DC

## 2022-04-02 MED ORDER — WATER FOR IRRIGATION, STERILE IR SOLN
Status: DC | PRN
  Administered 2022-04-02: 1000 mL

## 2022-04-02 MED ORDER — ACETAMINOPHEN 500 MG PO TABS
ORAL_TABLET | ORAL | Status: AC
  Administered 2022-04-02: 1000 mg via ORAL
  Filled 2022-04-02: qty 2

## 2022-04-02 MED ORDER — ONDANSETRON HCL 4 MG/2ML IJ SOLN
INTRAMUSCULAR | Status: AC
  Filled 2022-04-02: qty 2

## 2022-04-02 MED ORDER — TRAZODONE HCL 50 MG PO TABS
100.0000 mg | ORAL_TABLET | Freq: Every day | ORAL | Status: DC
  Administered 2022-04-04 – 2022-04-07 (×4): 100 mg via ORAL
  Filled 2022-04-02 (×4): qty 2

## 2022-04-02 MED ORDER — SUCCINYLCHOLINE CHLORIDE 200 MG/10ML IV SOSY
PREFILLED_SYRINGE | INTRAVENOUS | Status: DC | PRN
  Administered 2022-04-02: 120 mg via INTRAVENOUS

## 2022-04-02 MED ORDER — FENTANYL CITRATE (PF) 250 MCG/5ML IJ SOLN
INTRAMUSCULAR | Status: DC | PRN
  Administered 2022-04-02: 50 ug via INTRAVENOUS
  Administered 2022-04-02: 100 ug via INTRAVENOUS

## 2022-04-02 MED ORDER — QUETIAPINE FUMARATE 25 MG PO TABS
50.0000 mg | ORAL_TABLET | Freq: Every day | ORAL | Status: DC
  Administered 2022-04-03: 50 mg via ORAL
  Filled 2022-04-02: qty 2

## 2022-04-02 MED ORDER — ONDANSETRON HCL 4 MG/2ML IJ SOLN: 4.0000 mg | Freq: Four times a day (QID) | INTRAMUSCULAR | Status: DC | PRN

## 2022-04-02 MED ORDER — SUGAMMADEX SODIUM 200 MG/2ML IV SOLN
INTRAVENOUS | Status: DC | PRN
  Administered 2022-04-02: 300 mg via INTRAVENOUS

## 2022-04-02 MED ORDER — INSULIN GLARGINE-YFGN 100 UNIT/ML ~~LOC~~ SOLN
20.0000 [IU] | Freq: Every day | SUBCUTANEOUS | Status: DC
  Administered 2022-04-02: 20 [IU] via SUBCUTANEOUS
  Filled 2022-04-02 (×2): qty 0.2

## 2022-04-02 MED ORDER — ROCURONIUM BROMIDE 10 MG/ML (PF) SYRINGE
PREFILLED_SYRINGE | INTRAVENOUS | Status: DC | PRN
  Administered 2022-04-02: 30 mg via INTRAVENOUS
  Administered 2022-04-02: 50 mg via INTRAVENOUS

## 2022-04-02 MED ORDER — SODIUM CHLORIDE 0.9 % IR SOLN
Status: DC | PRN
  Administered 2022-04-02: 3000 mL

## 2022-04-02 MED ORDER — ENOXAPARIN SODIUM 40 MG/0.4ML IJ SOSY
40.0000 mg | PREFILLED_SYRINGE | INTRAMUSCULAR | Status: DC
  Administered 2022-04-03 – 2022-04-04 (×2): 40 mg via SUBCUTANEOUS
  Filled 2022-04-02: qty 0.4

## 2022-04-02 MED ORDER — ONDANSETRON HCL 4 MG/2ML IJ SOLN
INTRAMUSCULAR | Status: DC | PRN
  Administered 2022-04-02: 4 mg via INTRAVENOUS

## 2022-04-02 MED ORDER — INSULIN ASPART 100 UNIT/ML IJ SOLN
0.0000 [IU] | INTRAMUSCULAR | Status: DC | PRN
  Administered 2022-04-02: 5 [IU] via SUBCUTANEOUS

## 2022-04-02 MED ORDER — ROCURONIUM BROMIDE 10 MG/ML (PF) SYRINGE
PREFILLED_SYRINGE | INTRAVENOUS | Status: AC
  Filled 2022-04-02: qty 10

## 2022-04-02 MED ORDER — PANCRELIPASE (LIP-PROT-AMYL) 12000-38000 UNITS PO CPEP
24000.0000 [IU] | ORAL_CAPSULE | Freq: Three times a day (TID) | ORAL | Status: DC
  Administered 2022-04-02 – 2022-04-03 (×3): 24000 [IU] via ORAL
  Filled 2022-04-02 (×3): qty 2

## 2022-04-02 MED ORDER — HYDROMORPHONE HCL 1 MG/ML IJ SOLN: 1.0000 mg | INTRAMUSCULAR | Status: DC | PRN

## 2022-04-02 MED ORDER — ONDANSETRON HCL 4 MG/2ML IJ SOLN
4.0000 mg | Freq: Once | INTRAMUSCULAR | Status: AC
  Administered 2022-04-02: 4 mg via INTRAVENOUS
  Filled 2022-04-02: qty 2

## 2022-04-02 MED ORDER — CHLORHEXIDINE GLUCONATE CLOTH 2 % EX PADS
6.0000 | MEDICATED_PAD | Freq: Every day | CUTANEOUS | Status: AC
  Administered 2022-04-03 – 2022-04-07 (×5): 6 via TOPICAL

## 2022-04-02 MED ORDER — CEFAZOLIN SODIUM-DEXTROSE 2-4 GM/100ML-% IV SOLN
INTRAVENOUS | Status: AC
  Filled 2022-04-02: qty 100

## 2022-04-02 MED ORDER — ACETAMINOPHEN 10 MG/ML IV SOLN
INTRAVENOUS | Status: DC | PRN
  Administered 2022-04-02: 1000 mg via INTRAVENOUS

## 2022-04-02 MED ORDER — PROMETHAZINE HCL 25 MG/ML IJ SOLN: 6.2500 mg | INTRAMUSCULAR | Status: DC | PRN

## 2022-04-02 MED ORDER — LACTATED RINGERS IV SOLN: INTRAVENOUS | Status: DC

## 2022-04-02 MED ORDER — CEFAZOLIN SODIUM-DEXTROSE 2-4 GM/100ML-% IV SOLN
2.0000 g | INTRAVENOUS | Status: AC
  Administered 2022-04-02: 2 g via INTRAVENOUS

## 2022-04-02 MED ORDER — IRRISEPT - 450ML BOTTLE WITH 0.05% CHG IN STERILE WATER, USP 99.95% OPTIME
TOPICAL | Status: DC | PRN
  Administered 2022-04-02: 450 mL

## 2022-04-02 MED ORDER — ENOXAPARIN SODIUM 60 MG/0.6ML IJ SOSY: 0.5000 mg/kg | PREFILLED_SYRINGE | INTRAMUSCULAR | Status: DC

## 2022-04-02 MED ORDER — MUPIROCIN 2 % EX OINT
1.0000 | TOPICAL_OINTMENT | Freq: Two times a day (BID) | CUTANEOUS | Status: AC
  Administered 2022-04-02 – 2022-04-07 (×10): 1 via NASAL
  Filled 2022-04-02 (×2): qty 22

## 2022-04-02 MED ORDER — ACETAMINOPHEN 500 MG PO TABS
1000.0000 mg | ORAL_TABLET | Freq: Three times a day (TID) | ORAL | Status: DC
  Administered 2022-04-02 – 2022-04-08 (×17): 1000 mg via ORAL
  Filled 2022-04-02 (×18): qty 2

## 2022-04-02 MED ORDER — LIDOCAINE 2% (20 MG/ML) 5 ML SYRINGE
INTRAMUSCULAR | Status: AC
  Filled 2022-04-02: qty 5

## 2022-04-02 MED ORDER — FENTANYL CITRATE PF 50 MCG/ML IJ SOSY
50.0000 ug | PREFILLED_SYRINGE | INTRAMUSCULAR | Status: AC | PRN
  Administered 2022-04-02 (×2): 50 ug via INTRAVENOUS
  Filled 2022-04-02 (×2): qty 1

## 2022-04-02 MED ORDER — ALBUMIN HUMAN 5 % IV SOLN: INTRAVENOUS | Status: DC | PRN

## 2022-04-02 MED ORDER — PHENYLEPHRINE HCL (PRESSORS) 10 MG/ML IV SOLN
INTRAVENOUS | Status: DC | PRN
  Administered 2022-04-02 (×3): 80 ug via INTRAVENOUS

## 2022-04-02 MED ORDER — ENOXAPARIN SODIUM 40 MG/0.4ML IJ SOSY: 40.0000 mg | PREFILLED_SYRINGE | INTRAMUSCULAR | Status: DC

## 2022-04-02 SURGICAL SUPPLY — 59 items
BLADE SAGITTAL 25.0X1.27X90 (BLADE) ×1 IMPLANT
BRUSH FEMORAL CANAL (MISCELLANEOUS) IMPLANT
CHLORAPREP W/TINT 26 (MISCELLANEOUS) ×2 IMPLANT
COVER SURGICAL LIGHT HANDLE (MISCELLANEOUS) ×1 IMPLANT
DERMABOND ADVANCED .7 DNX12 (GAUZE/BANDAGES/DRESSINGS) IMPLANT
DRAPE HALF SHEET 40X57 (DRAPES) ×1 IMPLANT
DRAPE HIP W/POCKET STRL (MISCELLANEOUS) ×1 IMPLANT
DRAPE INCISE IOBAN 85X60 (DRAPES) ×1 IMPLANT
DRAPE POUCH INSTRU U-SHP 10X18 (DRAPES) ×1 IMPLANT
DRAPE U-SHAPE 47X51 STRL (DRAPES) ×2 IMPLANT
DRSG AQUACEL AG ADV 3.5X10 (GAUZE/BANDAGES/DRESSINGS) ×1 IMPLANT
ELECT BLADE 4.0 EZ CLEAN MEGAD (MISCELLANEOUS) ×1
ELECTRODE BLDE 4.0 EZ CLN MEGD (MISCELLANEOUS) ×1 IMPLANT
GLOVE BIOGEL PI IND STRL 8 (GLOVE) ×1 IMPLANT
GLOVE SRG 8 PF TXTR STRL LF DI (GLOVE) ×1 IMPLANT
GLOVE SURG ORTHO 8.0 STRL STRW (GLOVE) ×2 IMPLANT
GLOVE SURG UNDER POLY LF SZ8 (GLOVE) ×1
GOWN STRL REUS W/ TWL LRG LVL3 (GOWN DISPOSABLE) ×2 IMPLANT
GOWN STRL REUS W/ TWL XL LVL3 (GOWN DISPOSABLE) ×1 IMPLANT
GOWN STRL REUS W/TWL LRG LVL3 (GOWN DISPOSABLE) ×2
GOWN STRL REUS W/TWL XL LVL3 (GOWN DISPOSABLE) ×1
HANDPIECE INTERPULSE COAX TIP (DISPOSABLE) ×1
HEAD CERAMIC V40 BIOLOX DEL 28 (Orthopedic Implant) IMPLANT
HOOD PEEL AWAY FLYTE STAYCOOL (MISCELLANEOUS) ×3 IMPLANT
INSERT ADM X3 REST 28/52 (Insert) IMPLANT
IRRIGATION SURGIPHOR STRL (IV SOLUTION) ×1 IMPLANT
KIT BASIN OR (CUSTOM PROCEDURE TRAY) ×1 IMPLANT
KIT TURNOVER KIT A (KITS) ×1 IMPLANT
LINER MDM 46MM (Orthopedic Implant) IMPLANT
MANIFOLD NEPTUNE II (INSTRUMENTS) ×1 IMPLANT
MARKER SKIN DUAL TIP RULER LAB (MISCELLANEOUS) ×1 IMPLANT
NDL 18GX1X1/2 (RX/OR ONLY) (NEEDLE) ×1 IMPLANT
NEEDLE 18GX1X1/2 (RX/OR ONLY) (NEEDLE) ×1 IMPLANT
NS IRRIG 1000ML POUR BTL (IV SOLUTION) ×1 IMPLANT
PACK TOTAL JOINT (CUSTOM PROCEDURE TRAY) ×1 IMPLANT
PRESSURIZER FEMORAL UNIV (MISCELLANEOUS) IMPLANT
RETRIEVER SUT HEWSON (MISCELLANEOUS) ×1 IMPLANT
SCREW HEX LP 6.5X15 (Screw) IMPLANT
SCREW HEX LP 6.5X30 (Screw) IMPLANT
SEALER BIPOLAR AQUA 6.0 (INSTRUMENTS) ×1 IMPLANT
SET HNDPC FAN SPRY TIP SCT (DISPOSABLE) ×1 IMPLANT
SHELL ACETAB TRIDENT 58 (Shell) IMPLANT
STEM 37MM HIP (Hips) IMPLANT
SUCTION FRAZIER HANDLE 10FR (MISCELLANEOUS) ×1
SUCTION TUBE FRAZIER 10FR DISP (MISCELLANEOUS) ×1 IMPLANT
SUT BONE WAX W31G (SUTURE) ×1 IMPLANT
SUT ETHIBOND 2 V 37 (SUTURE) ×1 IMPLANT
SUT MNCRL AB 3-0 PS2 18 (SUTURE) ×1 IMPLANT
SUT STRATAFIX 1PDS 45CM VIOLET (SUTURE) ×2 IMPLANT
SUT VIC AB 0 CT1 27 (SUTURE) ×1
SUT VIC AB 0 CT1 27XBRD ANBCTR (SUTURE) ×1 IMPLANT
SUT VIC AB 2-0 CT2 27 (SUTURE) ×2 IMPLANT
SYR 20ML LL LF (SYRINGE) ×1 IMPLANT
SYR 50ML LL SCALE MARK (SYRINGE) ×1 IMPLANT
TOWEL GREEN STERILE (TOWEL DISPOSABLE) ×1 IMPLANT
TOWER CARTRIDGE SMART MIX (DISPOSABLE) IMPLANT
TRAY FOLEY MTR SLVR 16FR STAT (SET/KITS/TRAYS/PACK) ×1 IMPLANT
TUBE SUCT ARGYLE STRL (TUBING) ×1 IMPLANT
WATER STERILE IRR 1000ML POUR (IV SOLUTION) ×1 IMPLANT

## 2022-04-02 NOTE — Hospital Course (Addendum)
67 year old with a history of T1DM with retinopathy and neuropathy, OSA,  1/9 Food is good better than what he is used to. Feels breathing is doing well. Worked with PT wants to do more. Pushed himself somewhat during PT. L leg pain is still there. No sob while working with PT. No abdominal pain. Not urinating well. Says he has never had this bladder issue before as far as urination goes, he is confused as to why now. No urinary symptoms since may. Never had a foley catheter before. Always drank 7-8 bottles of water per day because it does his kidneys well.  No CP,SOB,N/V. Leg pain is there, getting a little better maybe but hurts to even move.  1/11 Bladder is feeling full. No ability to pee since the foley came out. Says he did pee yesterday but spilled it. Says it was fairly good amount. Says he is ready to get out of the hospital.Says hasn't used I/S today but wants to demonstrate. Tough getting back into bed from the chair without PT help. Says the foley catheter is very uncomfortable because things going the wrong way are uncomfortable. Doesn't want to have to go to his facility with the foley. Had a roommate with one and it was tough.  1/12 Feels ok. Says he doesn't like catheter will be nervous until its gone. Breathing is great. Leg soreness is improving.No abdominal pain. Needs more stool softener. Last week last bowel movement.Mirilax not working. Okay to discharge to new facility today and have urology outpatient follow up for foley.

## 2022-04-02 NOTE — Plan of Care (Signed)

## 2022-04-02 NOTE — Consult Note (Signed)
ORTHOPAEDIC CONSULTATION  REQUESTING PHYSICIAN: Velna Ochs, MD  Chief Complaint: left hip fracture  HPI: Thomas Frye is a 67 y.o. male who was walking with his walker overnight when he sustained a fall.  He had pain in the left hip.  Unable to ambulate.  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  He does ambulate with a prosthesis.  He uses a rollator because of poor balance.  The amputation was done because of a nonhealing ulcer on the bottom of his foot.  He denies pain in the right lower extremity.  He has neuropathy at baseline secondary to the diabetes.  Past Medical History:  Diagnosis Date   ARF (acute renal failure) ([place]) 09/2015   Cerebrovascular disease    Right thalamic, left parietal white matter strokes   Depression    Diabetes mellitus    Diabetic peripheral neuropathy (HCC)    DKA (diabetic ketoacidoses) 09/2015   Gait disturbance    GERD (gastroesophageal reflux disease)    H/O hiatal hernia    Left foot drop 10/05/2012   Neuromuscular disorder (HCC)    neuropathy   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.2) 10/05/2012   Sciatic neuropathy    Left-sided nerve   Sleep apnea    wears cpap at night   Past Surgical History:  Procedure Laterality Date   EYE SURGERY     FRACTURE SURGERY     Questionable steel screws to right tibia   TOE AMPUTATION  2008   Social History   Socioeconomic History   Marital status: Married    Spouse name: Not on file   Number of children: 4   Years of education: 16   Highest education level: Not on file  Occupational History   Occupation: Scientist, clinical (histocompatibility and immunogenetics): [employer]  Tobacco Use   Smoking status: Never   Smokeless tobacco: Never  Vaping Use   Vaping Use: Never used  Substance and Sexual Activity   Alcohol use: Not Currently    Alcohol/week: 1.0 standard drink of alcohol    Types: 1 Glasses of wine per week   Drug use: No   Sexual activity: Yes    Birth control/protection: Surgical   Other Topics Concern   Not on file  Social History Narrative   Not on file   Social Determinants of Health   Financial Resource Strain: Not on file  Food Insecurity: Not on file  Transportation Needs: Not on file  Physical Activity: Not on file  Stress: Not on file  Social Connections: Not on file   Family History  Problem Relation Age of Onset   Breast cancer Mother    Melanoma Mother    Diabetes Mother    Diabetes Brother    Diabetes Maternal Grandmother    Allergies  Allergen Reactions   Latex Swelling   Sulfa Antibiotics Hives     Positive ROS: All other systems have been reviewed and were otherwise negative with the exception of those mentioned in the HPI and as above.  Physical Exam: General: Alert, no acute distress Cardiovascular: No pedal edema Respiratory: No cyanosis, no use of accessory musculature Skin: No lesions in the area of chief complaint Neurologic: Sensation intact distally Psychiatric: Patient is competent for consent with normal mood and affect  MUSCULOSKELETAL:  LLE No traumatic wounds, ecchymosis, or rash  Pain with gentle left hip range of motion  Review well-healed amputation stump  Mild tenderness to palpation about the  left thigh left femur, no significant swelling   RLE No traumatic wounds, ecchymosis, or rash  Nontender  No groin pain with log roll  No knee or ankle effusion  Sens DPN, SPN, TN intact  Motor EHL, ext, flex 5/5  Foot WWP, No significant edema      IMAGING: X-rays pelvis left hip demonstrate displaced left femoral neck fracture  Assessment: Principal Problem:   Closed displaced fracture of left femoral neck (HCC)  Left displaced femoral neck fracture  Plan: Given the displacement of fracture, not amenable to fixation, recommend arthroplasty. Given patient's young age would expect better function and durability of a total hip arthroplasty.  The risks benefits and alternatives were discussed with the  patient including but not limited to the risks of nonoperative treatment, versus surgical intervention including infection, bleeding, nerve injury, periprosthetic fracture, the need for revision surgery, dislocation, leg length discrepancy, blood clots, cardiopulmonary complications, morbidity, mortality, among others, and they were willing to proceed.   Consent was signed by myself and the patient.  Left leg was marked.    Joen Laura, MD  Contact information:   [id number] 7am-5pm epic message Dr. Blanchie Dessert, or call office for patient follow up: [phone] After hours and holidays please check [email or web address] for group call information for [facility]

## 2022-04-02 NOTE — ED Provider Notes (Signed)
[facility] EMERGENCY DEPARTMENT Provider Note   CSN: [id number] Arrival date & time: 04/02/22  0606     History  Chief Complaint  Patient presents with   Lytle Michaels    Thomas Frye is a 67 y.o. male.  The history is provided by the patient and the EMS personnel.  Patient with multiple medical conditions including diabetes, obesity, previous CVA presents with left hip pain after fall.  Patient resides in a local nursing facility.  Yesterday he was standing using his prosthetic limb and a rollator when he fell on his left hip.  Denies any head injury, no LOC.  Reports left hip and leg pain.  No other traumatic injury.  He is not on anticoagulation   Patient resides at [facility] for nursing and rehab Past Medical History:  Diagnosis Date   ARF (acute renal failure) ([place]) 09/2015   Cerebrovascular disease    Right thalamic, left parietal white matter strokes   Depression    Diabetes mellitus    Diabetic peripheral neuropathy (HCC)    DKA (diabetic ketoacidoses) 09/2015   Gait disturbance    GERD (gastroesophageal reflux disease)    H/O hiatal hernia    Left foot drop 10/05/2012   Neuromuscular disorder (HCC)    neuropathy   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.2) 10/05/2012   Sciatic neuropathy    Left-sided nerve   Sleep apnea    wears cpap at night    Home Medications Prior to Admission medications   Medication Sig Start Date End Date Taking? Authorizing Provider  acetaminophen (TYLENOL) 325 MG tablet Take 650 mg by mouth every 6 (six) hours as needed for moderate pain.    [provider]  amitriptyline (ELAVIL) 25 MG tablet Take 25 mg by mouth at bedtime.    [provider]  aspirin EC 81 MG tablet Take 1 tablet (81 mg total) by mouth daily. Swallow whole. 08/14/21   Georgette Shell, MD  atorvastatin (LIPITOR) 10 MG tablet Take 10 mg by mouth at bedtime.    [provider]  Continuous Blood Gluc Receiver (FREESTYLE  LIBRE 14 DAY READER) DEVI by Does not apply route.    [provider]  doxycycline (VIBRA-TABS) 100 MG tablet Take 1 tablet (100 mg total) by mouth every 12 (twelve) hours. 08/13/21   Georgette Shell, MD  ferrous sulfate 325 (65 FE) MG tablet Take 325 mg by mouth daily with breakfast.    [provider]  Insulin Disposable Pump (OMNIPOD DASH PODS, GEN 4,) MISC USE 1 POD EVERY 2 DAYS AS NEEDED 01/13/20   [provider]  insulin glargine (SEMGLEE, YFGN,) 100 UNIT/ML Solostar Pen Inject 54 Units into the skin daily.    [provider]  insulin lispro (HUMALOG) 100 UNIT/ML cartridge Inject into the skin 3 (three) times daily with meals. Used with insulin pump    [provider]  lisinopril (ZESTRIL) 20 MG tablet Take 20 mg by mouth daily.    [provider]  loperamide (IMODIUM) 2 MG capsule Take by mouth as needed for diarrhea or loose stools.    [provider]  Multiple Vitamin (MULTIVITAMIN WITH MINERALS) TABS tablet Take 1 tablet by mouth daily. 08/14/21   Georgette Shell, MD  omeprazole (PRILOSEC) 40 MG capsule Take 40 mg by mouth 2 (two) times daily.    [provider]  Pancrelipase, Lip-Prot-Amyl, 4200-14200 units CPEP Take 2 capsules by mouth 3 (three) times daily. 2 capsules  with  meals  1 capsule with snacks    [provider]  pramipexole (MIRAPEX) 0.125 MG tablet Take 0.125 mg by mouth at bedtime.    [provider]  QUEtiapine (SEROQUEL) 50 MG tablet Take 50 mg by mouth at bedtime. 06/09/21   [provider]  saccharomyces boulardii (FLORASTOR) 250 MG capsule Take 250 mg by mouth daily.    [provider]  ticagrelor (BRILINTA) 90 MG TABS tablet Take 1 tablet (90 mg total) by mouth 2 (two) times daily. 08/13/21   Alwyn Ren, MD  venlafaxine XR (EFFEXOR-XR) 150 MG 24 hr capsule Take 150 mg by mouth daily with breakfast.    [provider]  vitamin E 1000 UNIT  capsule Take 1,000 Units by mouth in the morning and at bedtime.    [provider]      Allergies    Latex and Sulfa antibiotics    Review of Systems   Review of Systems  Musculoskeletal:  Positive for arthralgias. Negative for back pain.  Neurological:  Negative for headaches.    Physical Exam Updated Vital Signs BP (!) 141/82 (BP Location: Right Arm)   Pulse 90   Temp 98.6 F (37 C) (Oral)   Resp 14   Ht 1.905 m (6\' 3" )   Wt 115.2 kg   SpO2 91%   BMI 31.75 kg/m  Physical Exam CONSTITUTIONAL: Chronically ill-appearing, no acute distress HEAD: Normocephalic/atraumatic, no signs of trauma EYES: EOMI/PERRL ENMT: Mucous membranes moist NECK: supple no meningeal signs SPINE/BACK: No cervical spine tenderness CV: S1/S2 noted, no murmurs/rubs/gallops noted LUNGS: Lungs are clear to auscultation bilaterally, no apparent distress ABDOMEN: soft, nontender, obese NEURO: Pt is awake/alert/appropriate He can move all extremities x 4.  Movement of left lower extremity is limited due to pain EXTREMITIES: pulses normal/equal Distal pulses equal and intact.  No tenderness noted with palpation or range of motion of right leg.  Patient had previous BKA on the left.  He has tenderness of range of motion of left hip, and diffuse tenderness to the left thigh and knee. SKIN: warm, color normal PSYCH: no abnormalities of mood noted, alert and oriented to situation  ED Results / Procedures / Treatments   Labs (all labs ordered are listed, but only abnormal results are displayed) Labs Reviewed  BASIC METABOLIC PANEL  CBC WITH DIFFERENTIAL/PLATELET  PROTIME-INR  TYPE AND SCREEN    EKG None  Radiology No results found.  Procedures Procedures    Medications Ordered in ED Medications  fentaNYL (SUBLIMAZE) injection 50 mcg (has no administration in time range)  ondansetron (ZOFRAN) injection 4 mg (has no administration in time range)    ED Course/ Medical Decision  Making/ A&P Clinical Course as of 04/02/22 0711  Sat Apr 02, 2022  0622 Patient fell yesterday at the nursing home.  And overnight he was found to have a left hip fracture.  Patient denies any other traumatic injuries.  He had previous BKA of the same limb.  I spoke to his son 0623 via phone at [phone].  He reports patient did fall yesterday while trying to use a rollator.  He is aware the patient has hip fracture and will be admitted [DW]  0707 D/w dr 0708 with ortho - he is aware of patient, keep NPO and admit to medicine [DW]  0709 Signed out to Dr 0710 at shift change to call report to unassigned medicine  [DW]    Clinical Course User Index [DW] Roselyn Bering, MD  Medical Decision Making Amount and/or Complexity of Data Reviewed Labs: ordered. Radiology: ordered.  Risk Prescription drug management. Decision regarding hospitalization.   This patient presents to the ED for concern of hip pain, this involves an extensive number of treatment options, and is a complaint that carries with it a high risk of complications and morbidity.  The differential diagnosis includes but is not limited to hip fracture, hip dislocation, lumbar spine fracture, pelvic fracture, muscle strain  Comorbidities that complicate the patient evaluation: Patient's presentation is complicated by their history of diabetes, obesity  Social Determinants of Health: Patient's  previous BKA and limited mobility   increases the complexity of managing their presentation  Additional history obtained: Additional history obtained from family and EMS discussed with paramedic at the bedside Records reviewed Care Everywhere/External Records  Consultations Obtained: I requested consultation with the consultant ortho , and discussed  findings as well as pertinent plan - they recommend: admit to medicine  Reevaluation: After the interventions noted above, I reevaluated the  patient and found that they have :stayed the same  Complexity of problems addressed: Patient's presentation is most consistent with  acute presentation with potential threat to life or bodily function  Disposition: After consideration of the diagnostic results and the patient's response to treatment,  I feel that the patent would benefit from admission   .           Final Clinical Impression(s) / ED Diagnoses Final diagnoses:  Closed fracture of left hip, initial encounter [facility])    Rx / DC Orders ED Discharge Orders     None         Zadie Rhine, MD 04/02/22 [phone]

## 2022-04-02 NOTE — Progress Notes (Signed)
Pt has been difficult to arouse throughout PACU stay. Vitals are stable, pt follows commands for physical movement. Pt did answer Dr. Lissa Hoard when he came to bedside. Pt became difficult to arouse and was only opening eyes but not following commands to move arms or legs. Dr. Lissa Hoard notified and at bedside. We were able to arouse patient enough to ask him questions.He is alert and oriented. His insulin pump is beeping, he states "it just does that". He did confirm insulin pump is off at this time. OK to transport per anesthesia.

## 2022-04-02 NOTE — ED Triage Notes (Signed)
Pt BIB [facility] from [facility] c/o a left hip fx. Pt is a right BKA and was standing with therapy using his prosthetic leg when his legs gave out and he fell onto the left side. Per facility they had imaging done that showed a left hip fx.

## 2022-04-02 NOTE — Discharge Instructions (Addendum)
It was a pleasure taking care of you in the hospital. You were brought in after a fall that required surgery in your hip to fix. On top of that, it was found that you're having some trouble urinating, so we will keep the foley in. I have made you an appointment with Dr. Diona Fanti at [facility] at 9:45 on 1/26. Please see your primary care provider whenever you get a chance. Thank you!    INSTRUCTIONS AFTER JOINT REPLACEMENT   Remove items at home which could result in a fall. This includes throw rugs or furniture in walking pathways ICE to the affected joint every three hours while awake for 30 minutes at a time, for at least the first 3-5 days, and then as needed for pain and swelling.  Continue to use ice for pain and swelling. You may notice swelling that will progress down to the foot and ankle.  This is normal after surgery.  Elevate your leg when you are not up walking on it.   Continue to use the breathing machine you got in the hospital (incentive spirometer) which will help keep your temperature down.  It is common for your temperature to cycle up and down following surgery, especially at night when you are not up moving around and exerting yourself.  The breathing machine keeps your lungs expanded and your temperature down.   DIET:  As you were doing prior to hospitalization, we recommend a well-balanced diet.  DRESSING / WOUND CARE / SHOWERING  Keep the surgical dressing until follow up.  The dressing is water proof, so you can shower without any extra covering.  IF THE DRESSING FALLS OFF or the wound gets wet inside, change the dressing with sterile gauze.  Please use good hand washing techniques before changing the dressing.  Do not use any lotions or creams on the incision until instructed by your surgeon.    ACTIVITY  Increase activity slowly as tolerated, but follow the weight bearing instructions below.   No driving for 6 weeks or until further direction given by your  physician.  You cannot drive while taking narcotics.  No lifting or carrying greater than 10 lbs. until further directed by your surgeon. Avoid periods of inactivity such as sitting longer than an hour when not asleep. This helps prevent blood clots.  You may return to work once you are authorized by your doctor.     WEIGHT BEARING   Weight bearing as tolerated with assist device (walker, cane, etc) as directed, use it as long as suggested by your surgeon or therapist, typically at least 4-6 weeks.   EXERCISES  Results after joint replacement surgery are often greatly improved when you follow the exercise, range of motion and muscle strengthening exercises prescribed by your doctor. Safety measures are also important to protect the joint from further injury. Any time any of these exercises cause you to have increased pain or swelling, decrease what you are doing until you are comfortable again and then slowly increase them. If you have problems or questions, call your caregiver or physical therapist for advice.   Rehabilitation is important following a joint replacement. After just a few days of immobilization, the muscles of the leg can become weakened and shrink (atrophy).  These exercises are designed to build up the tone and strength of the thigh and leg muscles and to improve motion. Often times heat used for twenty to thirty minutes before working out will loosen up your tissues and help  with improving the range of motion but do not use heat for the first two weeks following surgery (sometimes heat can increase post-operative swelling).   These exercises can be done on a training (exercise) mat, on the floor, on a table or on a bed. Use whatever works the best and is most comfortable for you.    Use music or television while you are exercising so that the exercises are a pleasant break in your day. This will make your life better with the exercises acting as a break in your routine that you  can look forward to.   Perform all exercises about fifteen times, three times per day or as directed.  You should exercise both the operative leg and the other leg as well.  Exercises include:   Quad Sets - Tighten up the muscle on the front of the thigh (Quad) and hold for 5-10 seconds.   Straight Leg Raises - With your knee straight (if you were given a brace, keep it on), lift the leg to 60 degrees, hold for 3 seconds, and slowly lower the leg.  Perform this exercise against resistance later as your leg gets stronger.  Leg Slides: Lying on your back, slowly slide your foot toward your buttocks, bending your knee up off the floor (only go as far as is comfortable). Then slowly slide your foot back down until your leg is flat on the floor again.  Angel Wings: Lying on your back spread your legs to the side as far apart as you can without causing discomfort.  Hamstring Strength:  Lying on your back, push your heel against the floor with your leg straight by tightening up the muscles of your buttocks.  Repeat, but this time bend your knee to a comfortable angle, and push your heel against the floor.  You may put a pillow under the heel to make it more comfortable if necessary.   A rehabilitation program following joint replacement surgery can speed recovery and prevent re-injury in the future due to weakened muscles. Contact your doctor or a physical therapist for more information on knee rehabilitation.    CONSTIPATION  Constipation is defined medically as fewer than three stools per week and severe constipation as less than one stool per week.  Even if you have a regular bowel pattern at home, your normal regimen is likely to be disrupted due to multiple reasons following surgery.  Combination of anesthesia, postoperative narcotics, change in appetite and fluid intake all can affect your bowels.   YOU MUST use at least one of the following options; they are listed in order of increasing strength to  get the job done.  They are all available over the counter, and you may need to use some, POSSIBLY even all of these options:    Drink plenty of fluids (prune juice may be helpful) and high fiber foods Colace 100 mg by mouth twice a day  Senokot for constipation as directed and as needed Dulcolax (bisacodyl), take with full glass of water  Miralax (polyethylene glycol) once or twice a day as needed.  If you have tried all these things and are unable to have a bowel movement in the first 3-4 days after surgery call either your surgeon or your primary doctor.    If you experience loose stools or diarrhea, hold the medications until you stool forms back up.  If your symptoms do not get better within 1 week or if they get worse, check with your doctor.  If you experience "the worst abdominal pain ever" or develop nausea or vomiting, please contact the office immediately for further recommendations for treatment.   ITCHING:  If you experience itching with your medications, try taking only a single pain pill, or even half a pain pill at a time.  You can also use Benadryl over the counter for itching or also to help with sleep.   TED HOSE STOCKINGS:  Use stockings on both legs until for at least 2 weeks or as directed by physician office. They may be removed at night for sleeping.  MEDICATIONS:  See your medication summary on the "After Visit Summary" that nursing will review with you.  You may have some home medications which will be placed on hold until you complete the course of blood thinner medication.  It is important for you to complete the blood thinner medication as prescribed.   Blood clot prevention (DVT Prophylaxis): After surgery you are at an increased risk for a blood clot. you were prescribed a blood thinner, Aspirin 81mg , to be taken twice daily for a total of 4 weeks from surgery to help reduce your risk of getting a blood clot. This will help prevent a blood clot. Signs of a pulmonary  embolus (blood clot in the lungs) include sudden short of breath, feeling lightheaded or dizzy, chest pain with a deep breath, rapid pulse rapid breathing. Signs of a blood clot in your arms or legs include new unexplained swelling and cramping, warm, red or darkened skin around the painful area. Please call the office or 911 right away if these signs or symptoms develop.  PRECAUTIONS:  If you experience chest pain or shortness of breath - call 911 immediately for transfer to the hospital emergency department.   If you develop a fever greater that 101 F, purulent drainage from wound, increased redness or drainage from wound, foul odor from the wound/dressing, or calf pain - CONTACT YOUR SURGEON.                                                   FOLLOW-UP APPOINTMENTS:  If you do not already have a post-op appointment, please call the office for an appointment to be seen by your surgeon.  Guidelines for how soon to be seen are listed in your "After Visit Summary", but are typically between 2-3 weeks after surgery.  OTHER INSTRUCTIONS:   POST-OPERATIVE OPIOID TAPER INSTRUCTIONS: It is important to wean off of your opioid medication as soon as possible. If you do not need pain medication after your surgery it is ok to stop day one. Opioids include: Codeine, Hydrocodone(Norco, Vicodin), Oxycodone(Percocet, oxycontin) and hydromorphone amongst others.  Long term and even short term use of opiods can cause: Increased pain response Dependence Constipation Depression Respiratory depression And more.  Withdrawal symptoms can include Flu like symptoms Nausea, vomiting And more Techniques to manage these symptoms Hydrate well Eat regular healthy meals Stay active Use relaxation techniques(deep breathing, meditating, yoga) Do Not substitute Alcohol to help with tapering If you have been on opioids for less than two weeks and do not have pain than it is ok to stop all together.  Plan to wean off  of opioids This plan should start within one week post op of your joint replacement. Maintain the same interval or time between taking each dose and  first decrease the dose.  Cut the total daily intake of opioids by one tablet each day Next start to increase the time between doses. The last dose that should be eliminated is the evening dose.   MAKE SURE YOU:  Understand these instructions.  Get help right away if you are not doing well or get worse.    Thank you for letting us be a part of your medical care team.  It is a privilege we respect greatly.  We hope these instructions will help you stay on track for a fast and full recovery!

## 2022-04-02 NOTE — ED Provider Notes (Signed)
Clinical Course as of 04/02/22 4627  Sat Apr 02, 2022  0350 Patient fell yesterday at the nursing home.  And overnight he was found to have a left hip fracture.  Patient denies any other traumatic injuries.  He had previous BKA of the same limb.  I spoke to his son Florence Canner via phone at [phone].  He reports patient did fall yesterday while trying to use a rollator.  He is aware the patient has hip fracture and will be admitted [DW]  0707 D/w dr Zachery Dakins with ortho - he is aware of patient, keep NPO and admit to medicine [DW]  0709 Signed out to Dr Hillard Danker at shift change to call report to unassigned medicine  [DW]  [phone] CBC with Differential(!) Labs reviewed.  No leukocytosis, anemia stable and hyponatremia noted [JK]    Clinical Course User Index [DW] Ripley Fraise, MD [JK] Dorie Rank, MD   Labs reviewed as noted above.  X-rays confirm the left femoral neck fracture.  I have consulted with the internal medicine service for admission and further treatment   Dorie Rank, MD 04/02/22 [phone]

## 2022-04-02 NOTE — H&P (Addendum)
Date: 04/02/2022               Patient Name:  Thomas Frye MRN: [id number]  DOB: 1955-05-11 Age / Sex: 67 y.o., male   PCP: Self, Corinna Capra, PA-C         Medical Service: Internal Medicine Teaching Service         Attending Physician: Dr. Reymundo Poll, MD    First Contact: Dr. Olegario Messier, MD Pager: [phone]  Second Contact: Dr. Rudene Christians, MD Pager: [phone]       After Hours (After 5p/  First Contact Pager: [phone]  weekends / holidays): Second Contact Pager: [phone]   Chief Complaint: Fall  History of Present Illness:  Mr. Meints is a 67 year old w/ PMH of T1DM with retinopathy and polyneuropathy, OSA, CVA, left BKA, and depression who presents from [facility] nursing facility for evaluation after a mechanical fall.  Patient reports he ambulates with his prosthetic leg and while walking in the facility yesterday he lost his footing and fell on his rear end.  He denied any dizziness, weakness or headaches prior to the fall.  His son picked him up and put him on a wheelchair. An x-ray was done at the facility that I reviewed that he had a hip fracture. He was given OxyContin and Tylenol for pain.  He presented to the ER today for further evaluation. He reports pain in his left hip and thigh that is worse with movement.  He endorsed neuropathy in his right leg but denies any shortness of breath, chest pain, weakness, abdominal pain, back pain, dysuria or recent falls.  ED course: Repeat imaging confirmed displaced left femoral neck fracture. Labs showed stable hemoglobin 11.2, no leukocytosis, mild hyponatremia and hyperglycemia of 330 on BMP. Hemodynamically stable.  Patient was given fentanyl for pain. Orthopedic surgery was consulted for evaluation and patient made n.p.o. pending surgery later this afternoon. IMTS consulted for admission.  Meds:  Current Meds  Medication Sig   acetaminophen (TYLENOL) 325 MG tablet Take 650 mg by mouth every 6 (six) hours as  needed for moderate pain.   amitriptyline (ELAVIL) 25 MG tablet Take 25 mg by mouth at bedtime.   aspirin EC 81 MG tablet Take 1 tablet (81 mg total) by mouth daily. Swallow whole.   ferrous sulfate 325 (65 FE) MG tablet Take 325 mg by mouth daily with breakfast.   insulin glargine (SEMGLEE, YFGN,) 100 UNIT/ML Solostar Pen Inject 38 Units into the skin daily.   insulin lispro (HUMALOG) 100 UNIT/ML cartridge Inject into the skin 3 (three) times daily with meals. Used with insulin pump   lisinopril (ZESTRIL) 20 MG tablet Take 20 mg by mouth daily.   loperamide (IMODIUM) 2 MG capsule Take by mouth as needed for diarrhea or loose stools.   melatonin (MELATONIN MAXIMUM STRENGTH) 5 MG TABS Take 10 mg by mouth at bedtime.   Multiple Vitamin (MULTIVITAMIN WITH MINERALS) TABS tablet Take 1 tablet by mouth daily.   omeprazole (PRILOSEC) 40 MG capsule Take 40 mg by mouth 2 (two) times daily.   Pancrelipase, Lip-Prot-Amyl, 4200-14200 units CPEP Take 2 capsules by mouth 3 (three) times daily. 2 capsules  with meals  1 capsule with snacks   pramipexole (MIRAPEX) 0.125 MG tablet Take 0.125 mg by mouth at bedtime.   QUEtiapine (SEROQUEL) 50 MG tablet Take 50 mg by mouth at bedtime.   saccharomyces boulardii (FLORASTOR) 250 MG capsule Take 250 mg by mouth daily.   ticagrelor (  BRILINTA) 90 MG TABS tablet Take 1 tablet (90 mg total) by mouth 2 (two) times daily. (Patient taking differently: Take 90 mg by mouth daily.)   traZODone (DESYREL) 100 MG tablet Take 100 mg by mouth at bedtime.   venlafaxine XR (EFFEXOR-XR) 150 MG 24 hr capsule Take 150 mg by mouth daily with breakfast.   vitamin E 1000 UNIT capsule Take 1,000 Units by mouth in the morning and at bedtime.    Allergies: Allergies as of 04/02/2022 - Review Complete 04/02/2022  Allergen Reaction Noted   Latex Swelling 10/13/2010   Sulfa antibiotics Hives 10/13/2010   Past Medical History:  Diagnosis Date   ARF (acute renal failure) (HCC) 09/2015    Cerebrovascular disease    Right thalamic, left parietal white matter strokes   Depression    Diabetes mellitus    Diabetic peripheral neuropathy (HCC)    DKA (diabetic ketoacidoses) 09/2015   Gait disturbance    GERD (gastroesophageal reflux disease)    H/O hiatal hernia    Left foot drop 10/05/2012   Neuromuscular disorder (HCC)    neuropathy   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.2) 10/05/2012   Sciatic neuropathy    Left-sided nerve   Sleep apnea    wears cpap at night    Family History: Family history of diabetes in mother and grandmother.  Social History: He has been residing [facility] for a year after his left BKA.  Used to work in [employer]. Married for 35 years, has 4 children and 11 grandchildren.  He ambulates with a prosthetic leg and sometimes uses a Rolator due to balance issues. Independent with most of his ADLs but facility cooks for him.  He drinks alcohol occasionally (6 beers in 1 year). He denies any tobacco or illicit drug use.  Review of Systems: A complete ROS was negative except as per HPI.   Physical Exam: Blood pressure (!) 140/75, pulse 87, temperature 98.6 F (37 C), temperature source Oral, resp. rate 11, height 6\' 3"  (1.905 m), weight 115.2 kg, SpO2 94 %.  General: Pleasant, well-appearing elderly man laying in bed. No acute distress. Head: Normocephalic. Atraumatic. CV: RRR. No murmurs, rubs, or gallops. Palpable right DP pulses. Pulmonary: Lungs CTAB. Normal effort. No wheezing or rales. Abdominal: Soft, nontender, nondistended. Normal bowel sounds. MSK: Left BKA w/ well-healed stump. Limited ROM of the left hip.  Mild diffuse tenderness to the left thigh to the knee. Skin: Warm and dry. No obvious rash or lesions. Neuro: A&Ox3. Moves all extremities. Normal sensation. No focal deficit. Psych: Normal mood and affect  EKG: personally reviewed my interpretation is normal sinus rhythm  CXR: personally reviewed my interpretation is mild diffuse  interstitial opacities  X-ray pelvis and femur: Displaced femoral neck fracture  Assessment & Plan by Problem: Principal Problem:   Closed displaced fracture of left femoral neck [facility])  Mr. Altadonna is a 67 year old w/ PMH of T1DM with retinopathy and polyneuropathy, OSA, CVA, left BKA, and depression who presented to the ER for further management of left hip fracture from a mechanical fall.  #Left femoral neck fracture #Mechanical fall Patient with history of left BKA a few years year ago from complications of diabetes/MRSA/osteomyelitis admitted for left hip fracture after mechanical fall at SNF. No syncope, SOB, CP, weakness or dizziness. Reports moderate pain with ROM of the left hip as well as pain down his left thigh to knee. Patient evaluated by orthopedic surgery with plan for surgery later this afternoon. -Ortho consulted, n.p.o. for surgery today -  Check vitamin D levels -Tylenol 1000 mg TID -Oxycodone 5 mg q6hr prn for moderate, severe pain -IV Dilaudid 1 mg q4hr prn for breakthrough pain -PT/OT eval after surgery  #Type 1 diabetes #Diabetic neuropathy #Pancreatic insufficiency Last A1c 7.7% 4 months ago. On chart review, patient's diabetes has been documented as type I and also as type II however patient is being treated for type 1 diabetes based on recent endocrinology visits. Patient is on long-acting insulin (Semglee 38 units daily) in addition to insulin pump (for sliding scale) to deliver fast acting insulin. Blood sugar 330 on BMP today. Denies any abdominal pain or history of pancreatitis but reports that he has diarrhea if he does not take his enzyme replacements. -Repeat A1c -SSI with q4h CBG checks until after surgery -Resume home insulin after surgery -Continue enzyme replacement  #HFpEF #HTN Mild hypertensive with SBP in the 140s to 150s partially due to patient's pain.  Last echo 07/2021 showed EF 60-65%, mild LVH and G1DD.  Denies any chest pain, shortness of  breath or leg swelling. There is mild interstitial edema on chest x-ray but no concern for HF exacerbation.  -Continue home lisinopril 20 mg after surgery -O2 supplementation as needed  #Hx CVA #HLD Reports history of 2 CVAs with no residual deficits. No recent lipid panel on file.  Reports occasional bleeding with shaving but denies any bruising, GI bleed, hemoptysis or hematuria. -Check lipid panel -Continue atorvastatin -Continue Brilinta after surgery  #Normocytic anemia Patient with a history of anemia with hemoglobin 11-12 in the last few years. Patient on iron supplementation but no documented colonoscopy. Denies any bloody stools or hematuria. Will check iron levels and consider outpatient GI referral for colonoscopy if low. -Check iron panel, ferritin, vitamin B12 -Daily CBC  #Mild hyponatremia Patient found have sodium of 129 on BMP. This corrects to 133 after correction of blood sugar of 330. -Monitor with daily BMP  #Depression Home regimen includes Seroquel 50 mg at bedtime, trazodone 100 mg at bedtime amitriptyline 25 mg at bedtime, and Effexor 150 mg with breakfast -Resume home meds after surgery  #OSA on CPAP Patient reports he has a CPAP machine but has not been using it lately. Reports he has been sleeping okay without the machine and denies any fatigue during the day. -CPAP at night -Continue melatonin at bedtime  CODE STATUS: Full code DIET: N.p.o. PPx: Lovenox  Dispo: Admit patient to Inpatient with expected length of stay greater than 2 midnights.  Signed: Lacinda Axon, MD 04/02/2022, 9:57 AM  Pager: [phone] Internal Medicine Teaching Service After 5pm on weekdays and 1pm on weekends: On Call pager: [phone]

## 2022-04-02 NOTE — Anesthesia Procedure Notes (Signed)
Procedure Name: Intubation Date/Time: 04/02/2022 1:35 PM  Performed by: Amadeo Garnet, CRNAPre-anesthesia Checklist: Patient identified, Emergency Drugs available, Suction available and Patient being monitored Patient Re-evaluated:Patient Re-evaluated prior to induction Oxygen Delivery Method: Circle system utilized Preoxygenation: Pre-oxygenation with 100% oxygen Induction Type: IV induction Ventilation: Mask ventilation without difficulty Laryngoscope Size: Mac and 4 Grade View: Grade I Tube type: Oral Tube size: 7.5 mm Number of attempts: 1 Airway Equipment and Method: Stylet and Oral airway Placement Confirmation: ETT inserted through vocal cords under direct vision, positive ETCO2 and breath sounds checked- equal and bilateral Secured at: 23 cm Tube secured with: Tape Dental Injury: Teeth and Oropharynx as per pre-operative assessment

## 2022-04-02 NOTE — Op Note (Signed)
04/02/2022  3:15 PM  PATIENT:  Thomas Frye   MRN: [id number]  PRE-OPERATIVE DIAGNOSIS: Left displaced femoral neck fracture  POST-OPERATIVE DIAGNOSIS:  same  PROCEDURE:  Procedure(s): TOTAL HIP ARTHROPLASTY POSTERIOR  PREOPERATIVE INDICATIONS:    Thomas Frye is an 67 y.o. male who has a diagnosis of left displaced femoral neck fracture.  The risks benefits and alternatives were discussed with the patient including but not limited to the risks of nonoperative treatment, versus surgical intervention including infection, bleeding, nerve injury, periprosthetic fracture, the need for revision surgery, dislocation, leg length discrepancy, blood clots, cardiopulmonary complications, morbidity, mortality, among others, and they were willing to proceed.     OPERATIVE REPORT     SURGEON:  Weber Cooks, MD    ASSISTANT: Skip Mayer, PA-C, (Present throughout the entire procedure,  necessary for completion of procedure in a timely manner, assisting with retraction, instrumentation, and closure)     ANESTHESIA: General  ESTIMATED BLOOD LOSS: 400cc    COMPLICATIONS:  None.     UNIQUE ASPECTS OF THE CASE: Patient with ipsilateral below-knee amputation, poor balance stability at baseline.  Elected for dual mobility liner for increased stability.  COMPONENTS:   Stryker Trident 258 mm acetabular shell, 46 mm MDM cementless liner, size 7 Accolade 2 with 127 degree neck angle, 28 x 52 dual mobility X.3 insert, 28+0 ceramic head Implant Name Type Inv. Item Serial No. Manufacturer Lot No. LRB No. Used Action  SHELL ACETAB TRIDENT 58 - [id number] Shell SHELL ACETAB TRIDENT 58  [employer] [id number] A Left 1 Implanted  SCREW HEX LP 6.5X30 - [id number] Screw SCREW HEX LP 6.5X30  [employer] [id number] Left 1 Implanted  SCREW HEX LP 6.5X15 - [id number] Screw SCREW HEX LP 6.5X15  [employer] FGRA Left 1 Implanted  LINER MDM - [id number] Orthopedic Implant LINER MDM   [employer] [id number] Left 1 Implanted  STEM HIP - [id number] Hips STEM HIP  [employer] [id number] A Left 1 Implanted  HEAD CERAMIC V40 BIOLOX DEL 28 - [id number] Orthopedic Implant HEAD CERAMIC V40 BIOLOX DEL 28  [employer] [id number] Left 1 Implanted  INSERT ADM X3 REST 28/52 - [id number] Insert INSERT ADM X3 REST 28/52  [employer] [id number] Left 1 Implanted      PROCEDURE IN DETAIL:   The patient was met in the holding area and  identified.  The appropriate hip was identified and marked at the operative site.  The patient was then transported to the OR  and  placed under anesthesia.  At that point, the patient was  placed in the lateral decubitus position with the operative side up and  secured to the operating room table  and all bony prominences padded. A subaxillary role was also placed.    The operative lower extremity was prepped from the iliac crest to the distal leg.  Sterile draping was performed.  Preoperative antibiotics, 2 gm of ancef,1 gm of Tranexamic Acid, and 8 mg of Decadron administered. Time out was performed prior to incision.      A routine posterolateral approach was utilized via sharp dissection  carried down to the subcutaneous tissue.  Gross bleeders were Bovie coagulated.  The iliotibial band was identified and incised along the length of the skin incision through the glute max fascia.  Charnley retractor was placed with care to protect the sciatic nerve posteriorly.  With the hip internally rotated, the piriformis tendon was identified and released from the femoral insertion and tagged with  a #2 Ethibond.  A capsulotomy was then performed off the femoral insertion and also tagged with a #2 Ethibond.    The femoral neck was exposed, and I resected the femoral neck based on preoperative templating relative to the lesser trochanter.    I then exposed the deep acetabulum, cleared out any tissue including the ligamentum teres.  After  adequate visualization, I excised the labrum.  I then started reaming with a 52 mm reamer, first medializing to the floor of the cotyloid fossa, and then in the position of the cup aiming towards the greater sciatic notch, matching the version of the transverse acetabular ligament and tucked under the anterior wall. I reamed up to 58 mm reamer with good bony bed preparation and a 58 mm cup was chosen.  The real cup was then impacted into place.  Appropriate version and inclination was confirmed clinically matching their bony anatomy, and also with the use of the jig.  I placed 2 screws in the posterior superior quadrant to augment fixation.  A MDM liner was placed and impacted. It was confirmed to be appropriately seated and the acetabular retractors were removed.    I then prepared the proximal femur using the box cutter, Charnley awl, and then sequentially broached starting with 0 up to a size 7.  A trial broach, neck, and head was utilized, and I reduced the hip and it was found to have excellent stability.  There was no impingement with full extension and 90 degrees external rotation.  The hip was stable at the position of sleep and with 90 degrees flexion and 90 degrees of internal rotation.  Leg lengths were also clinically assessed in the lateral position and felt to be equal. Intra-Op flatplate was obtained and confirmed appropriate component positions.  Good fill of the femur with the size 7 broach.  And restoration of leg length and offset. No evidence or concern for fracture.  A final femoral prosthesis size 7 was selected. I then impacted the real femoral prosthesis into place.I again trialed and selected a +0 mm ball. The hip was then reduced and taken through a range of motion. There was no impingement with full extension and 90 degrees external rotation.  The hip was stable at the position of sleep and with 90 degrees flexion and 90 degrees of internal rotation. Leg lengths were  again  assessed and felt to be restored.  We then opened, and I impacted the real head ball into place.  The posterior capsule was then closed with #2 Ethibond.  The piriformis was repaired through the base of the abductor tendon using a [place] suture passer.  I then irrigated the hip copiously with Irrisept irrigation and with normal saline pulse lavage. Periarticular injection was then performed with Exparel.   We repaired the fascia #1 barbed suture, followed by 0 barbed suture for the subcutaneous fat.  Skin was closed with 2-0 Vicryl and 3-0 Monocryl.  Dermabond and Aquacel dressing were applied. The patient was then awakened and returned to PACU in stable and satisfactory condition.  Leg lengths in the supine position were assessed and felt to be clinically equal. There were no complications.  Post op recs: WB: WBAT LLE, No formal hip precautions Abx: ancef Imaging: PACU pelvis Xray Dressing: Aquacell, keep intact until follow up DVT prophylaxis: Aspirin 81BID starting POD1 Follow up: 2 weeks after surgery for a wound check with Dr. Zachery Dakins at [facility].  Address: [address],  [place], [place] [address]  Office Phone: [phone]   Charlies Constable, MD Orthopedic Surgeon

## 2022-04-02 NOTE — Anesthesia Preprocedure Evaluation (Addendum)
Anesthesia Evaluation  Patient identified by MRN, date of birth, ID band Patient awake    Reviewed: Allergy & Precautions, NPO status , Patient's Chart, lab work & pertinent test results  Airway Mallampati: II  TM Distance: >3 FB Neck ROM: Full    Dental  (+) Dental Advisory Given   Pulmonary sleep apnea    Pulmonary exam normal breath sounds clear to auscultation       Cardiovascular hypertension, Pt. on medications + Peripheral Vascular Disease  Normal cardiovascular exam Rhythm:Regular Rate:Normal  Echo 07/2021  1. Left ventricular ejection fraction, by estimation, is 60 to 65%. The left ventricle has normal function. The left ventricle has no regional wall motion abnormalities. There is mild concentric left ventricular hypertrophy. Left ventricular diastolic parameters are consistent with Grade I diastolic dysfunction (impaired relaxation).   2. Right ventricular systolic function is normal. The right ventricular size is normal.   3. Left atrial size was mildly dilated.   4. The mitral valve is normal in structure. Trivial mitral valve regurgitation. No evidence of mitral stenosis.   5. The aortic valve is tricuspid. Aortic valve regurgitation is not visualized. No aortic stenosis is present.   6. The inferior vena cava is normal in size with greater than 50% respiratory variability, suggesting right atrial pressure of 3 mmHg.   Comparison(s): Prior images unable to be directly viewed, comparison made by report only.   Conclusion(s)/Recommendation(s): No intracardiac source of embolism detected on this transthoracic study. Consider a transesophageal echocardiogram to exclude cardiac source of embolism if clinically indicated.     Neuro/Psych  PSYCHIATRIC DISORDERS  Depression     Neuromuscular disease CVA    GI/Hepatic Neg liver ROS, hiatal hernia,GERD  ,,  Endo/Other  diabetes    Renal/GU Renal disease      Musculoskeletal negative musculoskeletal ROS (+)    Abdominal  (+) + obese  Peds  Hematology negative hematology ROS (+)   Anesthesia Other Findings   Reproductive/Obstetrics                             Anesthesia Physical Anesthesia Plan  ASA: 3  Anesthesia Plan: General   Post-op Pain Management: Tylenol PO (pre-op)*   Induction: Intravenous  PONV Risk Score and Plan: 3 and Ondansetron, Treatment may vary due to age or medical condition, Diphenhydramine and Midazolam  Airway Management Planned: Oral ETT  Additional Equipment:   Intra-op Plan:   Post-operative Plan: Extubation in OR  Informed Consent: I have reviewed the patients History and Physical, chart, labs and discussed the procedure including the risks, benefits and alternatives for the proposed anesthesia with the patient or authorized representative who has indicated his/her understanding and acceptance.     Dental advisory given  Plan Discussed with:   Anesthesia Plan Comments:        Anesthesia Quick Evaluation

## 2022-04-02 NOTE — Anesthesia Postprocedure Evaluation (Signed)
Anesthesia Post Note  Patient: Thomas Frye  Procedure(s) Performed: TOTAL HIP ARTHROPLASTY POSTERIOR (Left: Hip)     Patient location during evaluation: PACU Anesthesia Type: General Level of consciousness: sedated and patient cooperative Pain management: pain level controlled Vital Signs Assessment: post-procedure vital signs reviewed and stable Respiratory status: spontaneous breathing Cardiovascular status: stable Anesthetic complications: no   No notable events documented.  Last Vitals:  Vitals:   04/02/22 1730 04/02/22 1750  BP: (!) 106/57 104/60  Pulse: 90 95  Resp: 12 12  Temp: 36.6 C 36.4 C  SpO2: 94% 91%    Last Pain:  Vitals:   04/02/22 1750  TempSrc: Oral  PainSc:                  Nolon Nations

## 2022-04-02 NOTE — Transfer of Care (Signed)
Immediate Anesthesia Transfer of Care Note  Patient: Thomas Frye  Procedure(s) Performed: TOTAL HIP ARTHROPLASTY POSTERIOR (Left: Hip)  Patient Location: PACU  Anesthesia Type:General  Level of Consciousness: drowsy and patient cooperative  Airway & Oxygen Therapy: Patient Spontanous Breathing and Patient connected to nasal cannula oxygen  Post-op Assessment: Report given to RN and Post -op Vital signs reviewed and stable  Post vital signs: Reviewed and stable  Last Vitals:  Vitals Value Taken Time  BP 100/47 04/02/22 1602  Temp    Pulse 89 04/02/22 1605  Resp 15 04/02/22 1605  SpO2 86 % 04/02/22 1605  Vitals shown include unvalidated device data.  2L applied, sats 93%  Last Pain:  Vitals:   04/02/22 1201  TempSrc: Oral  PainSc:          Complications: No notable events documented.

## 2022-04-03 DIAGNOSIS — S72002A Fracture of unspecified part of neck of left femur, initial encounter for closed fracture: Secondary | ICD-10-CM | POA: Diagnosis not present

## 2022-04-03 LAB — BASIC METABOLIC PANEL
Anion gap: 9 (ref 5–15)
BUN: 27 mg/dL — ABNORMAL HIGH (ref 8–23)
CO2: 24 mmol/L (ref 22–32)
Calcium: 8.4 mg/dL — ABNORMAL LOW (ref 8.9–10.3)
Chloride: 96 mmol/L — ABNORMAL LOW (ref 98–111)
Creatinine, Ser: 1.62 mg/dL — ABNORMAL HIGH (ref 0.61–1.24)
GFR, Estimated: 47 mL/min — ABNORMAL LOW (ref >60)
Glucose, Bld: 287 mg/dL — ABNORMAL HIGH (ref 70–99)
Potassium: 4.3 mmol/L (ref 3.5–5.1)
Sodium: 129 mmol/L — ABNORMAL LOW (ref 135–145)

## 2022-04-03 LAB — GLUCOSE, CAPILLARY
Glucose-Capillary: 231 mg/dL — ABNORMAL HIGH (ref 70–99)
Glucose-Capillary: 241 mg/dL — ABNORMAL HIGH (ref 70–99)
Glucose-Capillary: 261 mg/dL — ABNORMAL HIGH (ref 70–99)
Glucose-Capillary: 261 mg/dL — ABNORMAL HIGH (ref 70–99)
Glucose-Capillary: 264 mg/dL — ABNORMAL HIGH (ref 70–99)
Glucose-Capillary: 277 mg/dL — ABNORMAL HIGH (ref 70–99)
Glucose-Capillary: 316 mg/dL — ABNORMAL HIGH (ref 70–99)

## 2022-04-03 LAB — CBC
HCT: 27.6 % — ABNORMAL LOW (ref 39.0–52.0)
Hemoglobin: 9.2 g/dL — ABNORMAL LOW (ref 13.0–17.0)
MCH: 32.9 pg (ref 26.0–34.0)
MCHC: 33.3 g/dL (ref 30.0–36.0)
MCV: 98.6 fL (ref 80.0–100.0)
Platelets: 191 10*3/uL (ref 150–400)
RBC: 2.8 MIL/uL — ABNORMAL LOW (ref 4.22–5.81)
RDW: 12.8 % (ref 11.5–15.5)
WBC: 9.7 10*3/uL (ref 4.0–10.5)
nRBC: 0 % (ref 0.0–0.2)

## 2022-04-03 LAB — ABO/RH: ABO/RH(D): A POS

## 2022-04-03 MED ORDER — PANCRELIPASE (LIP-PROT-AMYL) 12000-38000 UNITS PO CPEP
24000.0000 [IU] | ORAL_CAPSULE | Freq: Three times a day (TID) | ORAL | Status: DC
  Administered 2022-04-03 – 2022-04-08 (×16): 24000 [IU] via ORAL
  Filled 2022-04-03 (×16): qty 2

## 2022-04-03 MED ORDER — LACTATED RINGERS IV SOLN: INTRAVENOUS | Status: AC

## 2022-04-03 MED ORDER — PANCRELIPASE (LIP-PROT-AMYL) 12000-38000 UNITS PO CPEP
24000.0000 [IU] | ORAL_CAPSULE | Freq: Three times a day (TID) | ORAL | Status: DC
  Filled 2022-04-03: qty 2

## 2022-04-03 MED ORDER — INSULIN ASPART 100 UNIT/ML IJ SOLN
10.0000 [IU] | Freq: Three times a day (TID) | INTRAMUSCULAR | Status: DC
  Administered 2022-04-03: 10 [IU] via SUBCUTANEOUS

## 2022-04-03 MED ORDER — INSULIN GLARGINE-YFGN 100 UNIT/ML ~~LOC~~ SOLN
38.0000 [IU] | Freq: Every day | SUBCUTANEOUS | Status: DC
  Administered 2022-04-03: 38 [IU] via SUBCUTANEOUS
  Filled 2022-04-03 (×2): qty 0.38

## 2022-04-03 MED ORDER — METHOCARBAMOL 500 MG PO TABS: 500.0000 mg | ORAL_TABLET | Freq: Four times a day (QID) | ORAL | Status: DC | PRN

## 2022-04-03 NOTE — Progress Notes (Signed)
Foley removed w/o issues or complaints.  Pt due to void @1240 

## 2022-04-03 NOTE — Progress Notes (Signed)
Patient refused CPAP for the night  

## 2022-04-03 NOTE — Progress Notes (Addendum)
Subjective:   Summary: Thomas Frye is a 67 y.o. year old male currently admitted on the I[facility] HD#1 for left femoral neck fracture after mechanical fall.  Overnight Events: No overnight events  Patient was seen this a.m. bedside.  He was very drowsy on exam however was able to communicate when prompted.  He is in moderate discomfort due to surgery.  However he states overall it has been going well.  He has been able to eat, and has no acute complaints.  He has not worked with PT OT yet.  Objective:  Vital signs in last 24 hours: Vitals:   04/02/22 2200 04/03/22 0641 04/03/22 0820 04/03/22 0942  BP: (!) 99/56 (!) 108/54 (!) 110/56   Pulse:   95   Resp:   17   Temp:   98.5 F (36.9 C)   TempSrc:   Oral   SpO2:   91% 93%  Weight:      Height:       Supplemental O2: Nasal Cannula SpO2: 93 % O2 Flow Rate (L/min): 3 L/min   Physical Exam:  Constitutional: Drowsy appearing male, laying in bed, in no acute distress Cardiovascular: RRR, no murmurs, rubs or gallops Pulmonary/Chest: normal work of breathing on room air, lungs clear to auscultation bilaterally Abdominal: soft, non-tender, non-distended Skin: warm and dry Extremities: upper/lower extremity pulses 2+, no lower extremity edema present, Aquacel intact on left extremity  Filed Weights   04/02/22 0614 04/02/22 1150  Weight: 115.2 kg 115.2 kg     Intake/Output Summary (Last 24 hours) at 04/03/2022 1242 Last data filed at 04/03/2022 0530 Gross per 24 hour  Intake 1995.73 ml  Output 2400 ml  Net -404.27 ml   Net IO Since Admission: -404.27 mL [04/03/22 1242]  Pertinent Labs:    Latest Ref Rng & Units 04/03/2022    3:26 AM 04/02/2022    7:14 AM 08/10/2021    5:00 AM  CBC  WBC 4.0 - 10.5 K/uL 9.7  10.0  7.7   Hemoglobin 13.0 - 17.0 g/dL 9.2  91.7  91.5   Hematocrit 39.0 - 52.0 % 27.6  32.7  36.7   Platelets 150 - 400 K/uL 191  196  219        Latest Ref Rng & Units 04/03/2022    3:26 AM  04/02/2022    7:14 AM 08/11/2021    5:46 AM  CMP  Glucose 70 - 99 mg/dL 056  979  480   BUN 8 - 23 mg/dL 27  21  11    Creatinine 0.61 - 1.24 mg/dL  1.65  5.37   Sodium 135 - 145 mmol/L 129  129  137   Potassium 3.5 - 5.1 mmol/L 4.3  4.4  3.8   Chloride 98 - 111 mmol/L 96  96  102   CO2 22 - 32 mmol/L 24  24  27    Calcium 8.9 - 10.3 mg/dL 8.4  8.7  9.3       Imaging: DG HIP UNILAT W OR W/O PELVIS 2-3 VIEWS LEFT  Result Date: 04/02/2022 CLINICAL DATA:  Postop left hip EXAM: DG HIP (WITH OR WITHOUT PELVIS) 2-3V LEFT COMPARISON:  None Available. FINDINGS: The patient is status post left hip replacement. Hardware is in good position on provided views. IMPRESSION: Left hip replacement as above. Electronically Signed   By: III M.D.   On:  04/02/2022 16:40   DG Pelvis 1-2 Views  Result Date: 04/02/2022 CLINICAL DATA:  Intraoperative study.  Elective surgery. EXAM: PELVIS - 1-2 VIEW COMPARISON:  None Available. FINDINGS: Two images were obtained during left hip replacement. Acetabular and femoral components are in good position. IMPRESSION: Two images were obtained during left hip replacement as above. Electronically Signed   By: Dorise Bullion III M.D.   On: 04/02/2022 15:24     Assessment/Plan:   Principal Problem:   Closed displaced fracture of left femoral neck (HCC) Active Problems:   OSA (obstructive sleep apnea)   Type 2 diabetes mellitus with hyperglycemia (HCC)   Hx of BKA, left (HCC)   Major depressive disorder, recurrent severe without psychotic features ([place])   Obesity (BMI 30-39.9)   Patient Summary: Mr. Thomas Frye is a 67 year old w/ PMH of T1DM with retinopathy and polyneuropathy, OSA, CVA, left BKA, and depression who presented to the ER for further management of left hip fracture from a mechanical fall    #Left femoral neck fracture status post total hip arthroplasty Patient has history of left BKA, and fell yesterday at SNF.  He was evaluated by  orthopedic surgery, was taken to the OR yesterday for total hip arthroplasty.  Surgery went well, with no complications.  Patient was seen today postop day 1 and appears drowsy on exam.  He has not been mobile as physical therapy has not worked with him yet.  He was complaining of pain this morning, so was able to give oxycodone 5 mg.  Will have to be careful with the patient's mental status in the setting of giving opioids, as patient has history of severe depression and is on centrally acting medications adequately.  Plan: - PT/OT evaluation - Tylenol 1000 mg 3 times daily, oxycodone 5 mg every 6 hours as needed for moderate, severe pain, IV Dilaudid 1 mg every 4 hours as needed for breakthrough pain - DVT prophylaxis with aspirin+brilinta (home meds) and low dose lovenox while hospitalized.  # Type 1 diabetes #Diabetic neuropathy #Pancreatic insufficiency Last A1c was 7.7% 4 months ago, current A1c is still pending.  Patient has a history of type 1 diabetes mellitus sees an endocrinologist for management.  Home regimen includes Semglee 38 units daily, in addition to an insulin pump for sliding scale.  Patient was n.p.o. yesterday for surgery, added 20 units of long-acting at night after surgery, however glucose remained elevated.  Will resume his home insulin dosage of 38 units, as well as sliding scale insulin.  Per chart review, patient was also has a history of pancreatic insufficiency for which she takes Creon supplementation for.  Will continue this setting as well.  Plan: - Continue Semglee 38 units daily - Continue sliding scale of insulin - Follow-up A1c - Continue Creon supplementation before meals  #AKI Creatinine was increased from 1.1-1.6.  Patient had an episode of hypotension in the OR.  Also patient was n.p.o. yesterday which could have led to dehydration as well.  Will give LR infusion to rehydrate patient.  Also hold lisinopril in the setting of the AKI.  Foley has been  removed, will bladder scan to assess for retention  #Heart failure with preserved ejection fraction #Hypertension Patient's blood pressure remained stable around 110/56.  Stop lisinopril in the setting of AKI.  Last echo in May 2023 showed EF of 60 to 65%, mild LVH and grade 1 diastolic dysfunction.  Currently looks euvolemic on exam.  # Depression Patient's home regimen includes Seroquel 50  mg, trazodone 100 mg, amitriptyline 25 mg, and Effexor 150 mg with breakfast.  Will continue these while inpatient, however will recommend discontinuing amitriptyline and Effexor as patient is a high risk for falls.  #Anemia of Chronic Disease Anemia of chronic disease revealed on iron studies obtained yesterday.  Likely in the setting of chronic comorbidities.  Patient has never had colonoscopy, denies any bloody stools.     Diet: Normal IVF: LR,Bolus Code: Full   Dispo: Anticipated discharge to Skilled nursing facility in 2 days pending medical management.   Olegario Messier, MD PGY-1 Internal Medicine Resident Pager Number [phone] Please contact the on call pager after 5 pm and on weekends at [phone].

## 2022-04-03 NOTE — Evaluation (Signed)
Physical Therapy Evaluation Patient Details Name: Thomas Frye MRN: [id number] DOB: 1955-07-27 Today's Date: 04/03/2022  History of Present Illness  67 y.o. male admitted on 04/02/22 for Fall with resultant L hip fx.  Pt s/p L posterior THA.  Per physician's note "no formal hip precautions" and WBAT post op.  Pt with significant PMH of L BKA (was ambulatory with prosthesis PTA--at SNF), DM, DKA, CVAs (per pt multiple).  Clinical Impression  Pt was one person max assist to get to sitting EOB and we were only there for ~5 mins due to lightheadedness and pain.  He will need significant post acute therapy secondary to his many pre-morbid conditions including L BKA, and per pt multiple previous strokes.  Pt is from SNF and is appropriate to return to SNF level for therapy.   PT to follow acutely for deficits listed below.      Recommendations for follow up therapy are one component of a multi-disciplinary discharge planning process, led by the attending physician.  Recommendations may be updated based on patient status, additional functional criteria and insurance authorization.  Follow Up Recommendations Skilled nursing-short term rehab (<3 hours/day) Can patient physically be transported by private vehicle: No    Assistance Recommended at Discharge Frequent or constant Supervision/Assistance  Patient can return home with the following  Two people to help with walking and/or transfers;Two people to help with bathing/dressing/bathroom;Assistance with cooking/housework;Assistance with feeding;Direct supervision/assist for medications management;Direct supervision/assist for financial management;Assist for transportation;Help with stairs or ramp for entrance    Equipment Recommendations Wheelchair (measurements PT);Wheelchair cushion (measurements PT);Hospital bed;Other (comment) (standing frame or hoyer lift)  Recommendations for Other Services       Functional Status Assessment Patient has had a  recent decline in their functional status and demonstrates the ability to make significant improvements in function in a reasonable and predictable amount of time.     Precautions / Restrictions Precautions Precautions: Fall;Other (comment) (per surgeon's op note "no formal hip precautions despite posterior approach---would maintin posterior precautions out of caution.) Precaution Comments: h/o falls, L BKA with prosthesis in room Required Braces or Orthoses: Other Brace Other Brace: L LE prosthesis in room Restrictions LLE Weight Bearing: Weight bearing as tolerated      Mobility  Bed Mobility Overal bed mobility: Needs Assistance Bed Mobility: Supine to Sit, Sit to Supine     Supine to sit: Max assist, HOB elevated Sit to supine: Max assist, HOB elevated   General bed mobility comments: Max assist of one to come to sitting EOB.  Initially, pt was trying to help with both hands on the bed rail, however, this was actually preventing [place] from coming up to sitting.  Once I convinced him to let go of the bedrail we were able to come to fully upright sitting EOB.  Reported lightheadedness in sitting (I was unable to get a BP reading as I was supporting him from behind and could not reach the button).  We sat ~ 5 mins before he wanted to return to supine.  We were not able to see if his prosthesis would fit as we were not EOB long enough and I did not have extra hands (I was fully supporting his trunk in sitting).    Transfers                   General transfer comment: Unable to attempt today.    Ambulation/Gait  Stairs            Wheelchair Mobility    Modified Rankin (Stroke Patients Only)       Balance Overall balance assessment: Needs assistance Sitting-balance support: Feet supported, No upper extremity supported, Bilateral upper extremity supported Sitting balance-Leahy Scale: Zero Sitting balance - Comments: max assist EOB.                                      Pertinent Vitals/Pain Pain Assessment Pain Assessment: Faces Faces Pain Scale: Hurts whole lot Pain Location: left hip Pain Descriptors / Indicators: Guarding, Grimacing Pain Intervention(s): Limited activity within patient's tolerance, Monitored during session, Repositioned, RN gave pain meds during session    [place] expects to be discharged to:: Skilled nursing facility                   Additional Comments: pt reported he was no longer working with therapy.  Family reports that he was told not to use a rollator and yet he was when he fell.    Prior Function Prior Level of Function : Needs assist       Physical Assist : Mobility (physical)     Mobility Comments: ambulatory with L LE prosthesis and RW       Hand Dominance   Dominant Hand: Left    Extremity/Trunk Assessment   Upper Extremity Assessment Upper Extremity Assessment: Defer to OT evaluation (has strength, but is slow to move, difficulty releasing grasp)    Lower Extremity Assessment Lower Extremity Assessment: RLE deficits/detail;LLE deficits/detail RLE Deficits / Details: right leg is missing his great toe, also slow to move on the right, at least 3/5 per bed level assessment. LLE Deficits / Details: left leg with normal post op pain and weakness, knee 2-/5, hip 2-/5 due to pain.       Communication   Communication: Expressive difficulties  Cognition Arousal/Alertness: Awake/alert Behavior During Therapy: Flat affect Overall Cognitive Status: History of cognitive impairments - at baseline                                 General Comments: slow to process, likely baseline post stroke.  Generally flat affect, but did smile when wife and grandkids called on the phone.        General Comments      Exercises Total Joint Exercises Quad Sets: AROM, Left, 10 reps, Supine Heel Slides: AAROM, Left, 10 reps, Supine Hip  ABduction/ADduction: AAROM, Left, 10 reps, Supine   Assessment/Plan    PT Assessment Patient needs continued PT services  PT Problem List Decreased strength;Decreased range of motion;Decreased activity tolerance;Decreased balance;Decreased mobility;Decreased cognition;Decreased coordination;Decreased knowledge of use of DME;Decreased safety awareness;Decreased knowledge of precautions;Pain       PT Treatment Interventions DME instruction;Gait training;Therapeutic activities;Balance training;Therapeutic exercise;Functional mobility training;Neuromuscular re-education;Cognitive remediation;Patient/family education;Wheelchair mobility training;Modalities    PT Goals (Current goals can be found in the Care Plan section)  Acute Rehab PT Goals Patient Stated Goal: to get well enough to go to ALF PT Goal Formulation: With patient/family Time For Goal Achievement: 04/17/22 Potential to Achieve Goals: Good    Frequency Min 3X/week     Co-evaluation               AM-PAC PT "6 Clicks" Mobility  Outcome Measure Help needed turning from your  back to your side while in a flat bed without using bedrails?: Total Help needed moving from lying on your back to sitting on the side of a flat bed without using bedrails?: Total Help needed moving to and from a bed to a chair (including a wheelchair)?: Total Help needed standing up from a chair using your arms (e.g., wheelchair or bedside chair)?: Total Help needed to walk in hospital room?: Total Help needed climbing 3-5 steps with a railing? : Total 6 Click Score: 6    End of Session Equipment Utilized During Treatment: Oxygen (before and after O2 donned) Activity Tolerance: Patient limited by pain Patient left: in bed;with call bell/phone within reach   PT Visit Diagnosis: Muscle weakness (generalized) (M62.81);Difficulty in walking, not elsewhere classified (R26.2);Pain Pain - Right/Left: Left Pain - part of body: Hip    Time:  [id number] PT Time Calculation (min) (ACUTE ONLY): 52 min   Charges:   PT Evaluation $PT Eval Moderate Complexity: 1 Mod PT Treatments $Therapeutic Exercise: 8-22 mins $Therapeutic Activity: 8-22 mins        Verdene Lennert, PT, DPT  Acute Rehabilitation Secure chat is best for contact #[phone] office    04/03/2022, 6:49 PM

## 2022-04-03 NOTE — Progress Notes (Signed)
     Subjective:  Patient reports pain as mild.  Slept well overnight. No concerns. Prosthesis at bedside. Plan for PT today.  Objective:   VITALS:   Vitals:   04/02/22 1750 04/02/22 2057 04/02/22 2200 04/03/22 0641  BP: 104/60 (!) 93/41 (!) 99/56 (!) 108/54  Pulse: 95 100    Resp: 12 16    Temp: 97.6 F (36.4 C) 98.4 F (36.9 C)    TempSrc: Oral Oral    SpO2: 91% 100%    Weight:      Height:        Incision: dressing C/D/I Compartment soft   Lab Results  Component Value Date   WBC 9.7 04/03/2022   HGB 9.2 (L) 04/03/2022   HCT 27.6 (L) 04/03/2022   MCV 98.6 04/03/2022   PLT 191 04/03/2022   BMET    Component Value Date/Time   NA 129 (L) 04/03/2022 0326   K 4.3 04/03/2022 0326   CL 96 (L) 04/03/2022 0326   CO2 24 04/03/2022 0326   GLUCOSE 287 (H) 04/03/2022 0326   BUN 27 (H) 04/03/2022 0326   CREATININE 1.62 (H) 04/03/2022 0326   CALCIUM 8.4 (L) 04/03/2022 0326   GFRNONAA 47 (L) 04/03/2022 0326      Xray: THA components good position no adverse features  Assessment/Plan: 1 Day Post-Op   Principal Problem:   Closed displaced fracture of left femoral neck (HCC)  S/p L THA for FN fx 04/02/22   Post op recs: WB: WBAT LLE, No formal hip precautions Abx: ancef Imaging: PACU pelvis Xray Dressing: Aquacell, keep intact until follow up DVT prophylaxis: Aspirin 81BID starting POD1 Follow up: 2 weeks after surgery for a wound check with Dr. Zachery Dakins at [facility].  Address: [address] [place], [place], [place] [address]  Office Phone: [phone]      Willaim Sheng 04/03/2022, 7:50 AM   Charlies Constable, MD  Contact information:   [phone] 7am-5pm epic message Dr. Zachery Dakins, or call office for patient follow up: [phone] After hours and holidays please check [email or web address] for group call information for [facility]

## 2022-04-03 NOTE — Plan of Care (Signed)
  Problem: Education: Goal: Ability to describe self-care measures that may prevent or decrease complications (Diabetes Survival Skills Education) will improve Outcome: Progressing   Problem: Education: Goal: Knowledge of General Education information will improve Description: Including pain rating scale, medication(s)/side effects and non-pharmacologic comfort measures Outcome: Progressing   Problem: Activity: Goal: Risk for activity intolerance will decrease Outcome: Progressing   Problem: Pain Managment: Goal: General experience of comfort will improve Outcome: Progressing   Problem: Safety: Goal: Ability to remain free from injury will improve Outcome: Progressing   Problem: Skin Integrity: Goal: Risk for impaired skin integrity will decrease Outcome: Progressing   

## 2022-04-03 NOTE — Progress Notes (Signed)
Pt had insulin pump on since admission.  Pts son stated that pup not working at the moment.  Pt refused to remove pump.   Pts insulin pump making beeping noises. Pt wanted to remove pump. Pump removed and pt stated to trash pump that is connected to him.

## 2022-04-04 ENCOUNTER — Inpatient Hospital Stay (HOSPITAL_COMMUNITY): Payer: Medicare Other

## 2022-04-04 ENCOUNTER — Encounter (HOSPITAL_COMMUNITY): Payer: Self-pay | Admitting: Orthopedic Surgery

## 2022-04-04 DIAGNOSIS — S72002A Fracture of unspecified part of neck of left femur, initial encounter for closed fracture: Secondary | ICD-10-CM | POA: Diagnosis not present

## 2022-04-04 LAB — CBC
HCT: 25.1 % — ABNORMAL LOW (ref 39.0–52.0)
Hemoglobin: 8.5 g/dL — ABNORMAL LOW (ref 13.0–17.0)
MCH: 33.1 pg (ref 26.0–34.0)
MCHC: 33.9 g/dL (ref 30.0–36.0)
MCV: 97.7 fL (ref 80.0–100.0)
Platelets: 158 10*3/uL (ref 150–400)
RBC: 2.57 MIL/uL — ABNORMAL LOW (ref 4.22–5.81)
RDW: 13 % (ref 11.5–15.5)
WBC: 9.6 10*3/uL (ref 4.0–10.5)
nRBC: 0 % (ref 0.0–0.2)

## 2022-04-04 LAB — BASIC METABOLIC PANEL WITH GFR
Anion gap: 7 (ref 5–15)
BUN: 30 mg/dL — ABNORMAL HIGH (ref 8–23)
CO2: 26 mmol/L (ref 22–32)
Calcium: 8.5 mg/dL — ABNORMAL LOW (ref 8.9–10.3)
Chloride: 101 mmol/L (ref 98–111)
Creatinine, Ser: 1.32 mg/dL — ABNORMAL HIGH (ref 0.61–1.24)
GFR, Estimated: >60 mL/min
Glucose, Bld: 84 mg/dL (ref 70–99)
Potassium: 3.6 mmol/L (ref 3.5–5.1)
Sodium: 134 mmol/L — ABNORMAL LOW (ref 135–145)

## 2022-04-04 LAB — GLUCOSE, CAPILLARY
Glucose-Capillary: 230 mg/dL — ABNORMAL HIGH (ref 70–99)
Glucose-Capillary: 162 mg/dL — ABNORMAL HIGH (ref 70–99)
Glucose-Capillary: 92 mg/dL (ref 70–99)
Glucose-Capillary: 61 mg/dL — ABNORMAL LOW (ref 70–99)
Glucose-Capillary: 65 mg/dL — ABNORMAL LOW (ref 70–99)
Glucose-Capillary: 123 mg/dL — ABNORMAL HIGH (ref 70–99)
Glucose-Capillary: 279 mg/dL — ABNORMAL HIGH (ref 70–99)

## 2022-04-04 LAB — HEMOGLOBIN A1C
Hgb A1c MFr Bld: 8.3 % — ABNORMAL HIGH (ref 4.8–5.6)
Mean Plasma Glucose: 192 mg/dL

## 2022-04-04 MED ORDER — INSULIN ASPART 100 UNIT/ML IJ SOLN: 10.0000 [IU] | Freq: Three times a day (TID) | INTRAMUSCULAR | Status: DC

## 2022-04-04 MED ORDER — OXYCODONE HCL 5 MG PO TABS
5.0000 mg | ORAL_TABLET | ORAL | Status: DC | PRN
  Administered 2022-04-04 – 2022-04-05 (×2): 5 mg via ORAL
  Filled 2022-04-04 (×2): qty 1

## 2022-04-04 MED ORDER — QUETIAPINE FUMARATE 25 MG PO TABS
25.0000 mg | ORAL_TABLET | Freq: Every day | ORAL | Status: DC
  Administered 2022-04-04 – 2022-04-07 (×4): 25 mg via ORAL
  Filled 2022-04-04 (×4): qty 1

## 2022-04-04 MED ORDER — VITAMIN D 25 MCG (1000 UNIT) PO TABS
1000.0000 [IU] | ORAL_TABLET | Freq: Every day | ORAL | Status: DC
  Administered 2022-04-04 – 2022-04-08 (×5): 1000 [IU] via ORAL
  Filled 2022-04-04 (×6): qty 1

## 2022-04-04 MED ORDER — CHOLECALCIFEROL 10 MCG (400 UNIT) PO TABS: 400.0000 [IU] | ORAL_TABLET | Freq: Every day | ORAL | Status: DC

## 2022-04-04 MED ORDER — INSULIN GLARGINE-YFGN 100 UNIT/ML ~~LOC~~ SOLN
42.0000 [IU] | Freq: Every day | SUBCUTANEOUS | Status: DC
  Filled 2022-04-04: qty 0.42

## 2022-04-04 MED ORDER — INSULIN ASPART 100 UNIT/ML IJ SOLN
0.0000 [IU] | Freq: Three times a day (TID) | INTRAMUSCULAR | Status: DC
  Administered 2022-04-05: 11 [IU] via SUBCUTANEOUS
  Administered 2022-04-05: 5 [IU] via SUBCUTANEOUS
  Administered 2022-04-05 – 2022-04-06 (×2): 11 [IU] via SUBCUTANEOUS
  Administered 2022-04-06: 5 [IU] via SUBCUTANEOUS
  Administered 2022-04-06: 8 [IU] via SUBCUTANEOUS
  Administered 2022-04-07: 11 [IU] via SUBCUTANEOUS
  Administered 2022-04-07: 3 [IU] via SUBCUTANEOUS
  Administered 2022-04-07 – 2022-04-08 (×2): 8 [IU] via SUBCUTANEOUS
  Administered 2022-04-08: 5 [IU] via SUBCUTANEOUS

## 2022-04-04 MED ORDER — INSULIN GLARGINE-YFGN 100 UNIT/ML ~~LOC~~ SOLN
20.0000 [IU] | Freq: Every day | SUBCUTANEOUS | Status: DC
  Administered 2022-04-05: 20 [IU] via SUBCUTANEOUS
  Filled 2022-04-04 (×2): qty 0.2

## 2022-04-04 MED ORDER — ENOXAPARIN SODIUM 300 MG/3ML IJ SOLN: 20.0000 mg | INTRAMUSCULAR | Status: DC

## 2022-04-04 MED ORDER — ENOXAPARIN SODIUM 40 MG/0.4ML IJ SOSY
40.0000 mg | PREFILLED_SYRINGE | INTRAMUSCULAR | Status: DC
  Administered 2022-04-05: 40 mg via SUBCUTANEOUS
  Filled 2022-04-04 (×2): qty 0.4

## 2022-04-04 MED ORDER — INSULIN GLARGINE-YFGN 100 UNIT/ML ~~LOC~~ SOLN
38.0000 [IU] | Freq: Every day | SUBCUTANEOUS | Status: DC
  Filled 2022-04-04: qty 0.38

## 2022-04-04 MED ORDER — INSULIN ASPART 100 UNIT/ML IJ SOLN
15.0000 [IU] | Freq: Three times a day (TID) | INTRAMUSCULAR | Status: DC
  Administered 2022-04-04 (×2): 15 [IU] via SUBCUTANEOUS

## 2022-04-04 NOTE — NC FL2 (Signed)
[place] LEVEL OF CARE FORM     IDENTIFICATION  Patient Name: Thomas Frye Birthdate: Jun 16, 1955 Sex: male Admission Date (Current Location): 04/02/2022  [facility] and [place] Number:  Herbalist and Address:  [facility], [place] [address] [place], [place] [address]      Provider Number: [id number]  Attending Physician Name and Address:  Charise Killian, MD  Relative Name and Phone Number:  Gapinski,Christy Spouse [phone]  [phone]    Current Level of Care: Hospital Recommended Level of Care: [place] Prior Approval Number:    Date Approved/Denied:   PASRR Number: [id number]  Discharge Plan: SNF    Current Diagnoses: Patient Active Problem List   Diagnosis Date Noted   Closed displaced fracture of left femoral neck ([place]) 04/02/2022   Type 2 diabetes mellitus with hyperglycemia ([place]) 06/10/2021   Pancreatic insufficiency 06/10/2021   Major depressive disorder, recurrent severe without psychotic features ([place]) 03/30/2021   Hx of BKA, left ([place]) 07/13/2020   Hyperlipidemia 10/06/2015   GERD (gastroesophageal reflux disease) 10/06/2015   OSA (obstructive sleep apnea) 10/06/2015   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.2) 10/05/2012   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, limited to breakdown of skin ([place]) 08/19/2012   HTN (hypertension) 08/18/2012   Obesity (BMI 30-39.9) 12/05/2011    Orientation RESPIRATION BLADDER Height & Weight     Self, Time, Situation, Place  Tracheostomy Incontinent Weight: 254 lb (115.2 kg) Height:  6\' 3"  (190.5 cm)  BEHAVIORAL SYMPTOMS/MOOD NEUROLOGICAL BOWEL NUTRITION STATUS      Continent Diet (see discharge summary)  AMBULATORY STATUS COMMUNICATION OF NEEDS Skin   Total Care Verbally Surgical wounds                       Personal Care Assistance Level of Assistance  Bathing, Feeding, Dressing, Total care Bathing Assistance: Maximum assistance Feeding  assistance: Limited assistance Dressing Assistance: Maximum assistance Total Care Assistance: Maximum assistance   Functional Limitations Info  Sight, Hearing, Speech Sight Info: Adequate Hearing Info: Adequate Speech Info: Adequate    SPECIAL CARE FACTORS FREQUENCY  PT (By licensed PT), OT (By licensed OT)     PT Frequency: 5x week OT Frequency: 5x week            Contractures Contractures Info: Not present    Additional Factors Info  Code Status, Allergies, Insulin Sliding Scale Code Status Info: full Allergies Info: Latex, Sulfa Antibiotics   Insulin Sliding Scale Info: Novolog: see discharge summary       Current Medications (04/04/2022):  This is the current hospital active medication list Current Facility-Administered Medications  Medication Dose Route Frequency Provider Last Rate Last Admin   acetaminophen (TYLENOL) tablet 1,000 mg  1,000 mg Oral TID Willaim Sheng, MD   1,000 mg at 04/04/22 6301   aspirin EC tablet 81 mg  81 mg Oral BID Willaim Sheng, MD   81 mg at 04/04/22 6010   Chlorhexidine Gluconate Cloth 2 % PADS 6 each  6 each Topical Q0600 Caren Griffins, MD   6 each at 04/04/22 0827   cholecalciferol (VITAMIN D3) 25 MCG (1000 UNIT) tablet 1,000 Units  1,000 Units Oral Daily Nooruddin, Saad, MD   1,000 Units at 04/04/22 1336   enoxaparin (LOVENOX) injection 40 mg  40 mg Subcutaneous Q24H Velna Ochs, MD   40 mg at 04/03/22 1752   insulin aspart (novoLOG) injection 0-15 Units  0-15 Units Subcutaneous TID  AC Nooruddin, Saad, MD       insulin aspart (novoLOG) injection 15 Units  15 Units Subcutaneous TID WC Nooruddin, Saad, MD   15 Units at 04/04/22 1339   insulin glargine-yfgn (SEMGLEE) injection 42 Units  42 Units Subcutaneous QHS Nooruddin, Saad, MD       lipase/protease/amylase (CREON) capsule 24,000 Units  24,000 Units Oral TID AC Tyson Alias, MD   24,000 Units at 04/04/22 1336   loperamide (IMODIUM) capsule 2 mg  2 mg Oral  PRN Joen Laura, MD       melatonin tablet 10 mg  10 mg Oral QHS Joen Laura, MD       mupirocin ointment (BACTROBAN) 2 % 1 Application  1 Application Nasal BID Leatha Gilding, MD   1 Application at 04/04/22 0827   ondansetron (ZOFRAN) injection 4 mg  4 mg Intravenous Q6H PRN Joen Laura, MD       Or   ondansetron [facility]) tablet 4 mg  4 mg Oral Q6H PRN Joen Laura, MD       oxyCODONE (Oxy IR/ROXICODONE) immediate release tablet 5 mg  5 mg Oral Q4H PRN Nooruddin, Saad, MD   5 mg at 04/04/22 3005   pantoprazole (PROTONIX) EC tablet 40 mg  40 mg Oral Daily Nooruddin, Saad, MD   40 mg at 04/04/22 0827   pramipexole (MIRAPEX) tablet 0.125 mg  0.125 mg Oral QHS Nooruddin, Saad, MD   0.125 mg at 04/03/22 2146   QUEtiapine (SEROQUEL) tablet 25 mg  25 mg Oral QHS Masters, Katie, DO       ticagrelor (BRILINTA) tablet 90 mg  90 mg Oral BID Reymundo Poll, MD   90 mg at 04/04/22 0827   traZODone (DESYREL) tablet 100 mg  100 mg Oral QHS Joen Laura, MD       venlafaxine XR (EFFEXOR-XR) 24 hr capsule 150 mg  150 mg Oral Q breakfast Joen Laura, MD   150 mg at 04/04/22 0827     Discharge Medications: Please see discharge summary for a list of discharge medications.  Relevant Imaging Results:  Relevant Lab Results:   Additional Information SSN: [id number]. Pt is vaccinated for covid with one or two boosters.  Lorri Frederick, LCSW

## 2022-04-04 NOTE — TOC Initial Note (Signed)
Transition of Care [facility]) - Initial/Assessment Note    Patient Details  Name: Thomas Frye MRN: [id number] Date of Birth: [birth year]  Transition of Care [facility] CM/SW Contact:    Bartholomew Crews, RN Phone Number: [phone] 04/04/2022, 2:05 PM  Clinical Narrative:                  Notified by PT of son wanting to discuss post acute transition plans. Spoke with son at the bedside who expressed his concerns about patient returning to same facility. Annie Main stated that patient has been at facility since last summer, but the care is not good. Would prefer facility in [facility] or [place] so that he could be closer to family. First choice of facilities is [facility]. Requested CSW assistance and f/u.   Expected Discharge Plan: [place] Barriers to Discharge: Continued Medical Work up   Patient Goals and CMS Choice Patient states their goals for this hospitalization and ongoing recovery are:: return to SNF for rehab - first choice is [facility] [employer].gov Compare Post Acute Care list provided to:: Patient Represenative (must comment) (son, Annie Main) Choice offered to / list presented to : Adult [place] ownership interest in [facility].provided to:: Adult Children    Expected Discharge Plan and Services In-house Referral: Clinical Social Work Discharge Planning Services: CM Consult Post Acute Care Choice: [place] Living arrangements for the past 2 months: [place]                 DME Arranged: N/A DME Agency: NA       HH Arranged: NA [place] Agency: NA        Prior Living Arrangements/Services Living arrangements for the past 2 months: [place] Lives with:: Facility Resident Patient language and need for interpreter reviewed:: Yes Do you feel safe going back to the place where you live?: No   family wants a different facility stating his care has not been good  Need for Family  Participation in Patient Care: Yes (Comment)     Criminal Activity/Legal Involvement Pertinent to Current Situation/Hospitalization: No - Comment as needed  Activities of Daily Living      Permission Sought/Granted Permission sought to share information with : Family Supports    Share Information with NAME: Florence Canner     Permission granted to share info w Relationship: son  Permission granted to share info w Contact Information: [phone]  Emotional Assessment Appearance:: Appears stated age       Alcohol / Substance Use: Not Applicable Psych Involvement: No (comment)  Admission diagnosis:  Closed fracture of left hip, initial encounter ([place]) [S72.002A] Closed displaced fracture of left femoral neck ([place]) [S72.002A] Patient Active Problem List   Diagnosis Date Noted   Closed displaced fracture of left femoral neck ([place]) 04/02/2022   Type 2 diabetes mellitus with hyperglycemia ([place]) 06/10/2021   Pancreatic insufficiency 06/10/2021   Major depressive disorder, recurrent severe without psychotic features ([place]) 03/30/2021   Hx of BKA, left ([place]) 07/13/2020   Hyperlipidemia 10/06/2015   GERD (gastroesophageal reflux disease) 10/06/2015   OSA (obstructive sleep apnea) 10/06/2015   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.2) 10/05/2012   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, limited to breakdown of skin ([place]) 08/19/2012   HTN (hypertension) 08/18/2012   Obesity (BMI 30-39.9) 12/05/2011   PCP:  Self, Verdene Lennert, PA-C Pharmacy:   [place], [place] [address]  [place] [address] Phone: [phone] Fax: [phone]     Social Determinants of Health (SDOH) Social History: SDOH Screenings   Tobacco Use: Low Risk  (04/02/2022)   SDOH Interventions:     Readmission Risk Interventions    08/11/2021    1:04 PM  Readmission Risk Prevention Plan  Transportation Screening Complete  PCP or Specialist Appt  within 5-7 Days Complete  Home Care Screening Complete  Medication Review (RN CM) Complete

## 2022-04-04 NOTE — Progress Notes (Signed)
     Subjective:  Patient reports pain as mild.  Fatigued yesterday in PT so did not try to stand from the bed. Voided only once yesterday and had to have straight cath this AM.  Objective:   VITALS:   Vitals:   04/03/22 1716 04/03/22 2100 04/04/22 0516 04/04/22 0600  BP: (!) 113/51 (!) 128/92 (!) 92/50 (!) 101/55  Pulse: 100 77 83   Resp: 19 20    Temp: 98.3 F (36.8 C) 98.4 F (36.9 C)    TempSrc: Oral Oral    SpO2: 95% 96%    Weight:      Height:        Incision: dressing C/D/I Compartment soft   Lab Results  Component Value Date   WBC 9.7 04/03/2022   HGB 9.2 (L) 04/03/2022   HCT 27.6 (L) 04/03/2022   MCV 98.6 04/03/2022   PLT 191 04/03/2022   BMET    Component Value Date/Time   NA 129 (L) 04/03/2022 0326   K 4.3 04/03/2022 0326   CL 96 (L) 04/03/2022 0326   CO2 24 04/03/2022 0326   GLUCOSE 287 (H) 04/03/2022 0326   BUN 27 (H) 04/03/2022 0326   CREATININE 1.62 (H) 04/03/2022 0326   CALCIUM 8.4 (L) 04/03/2022 0326   GFRNONAA 47 (L) 04/03/2022 0326      Xray: THA components good position no adverse features  Assessment/Plan: 2 Days Post-Op   Principal Problem:   Closed displaced fracture of left femoral neck (HCC) Active Problems:   OSA (obstructive sleep apnea)   Type 2 diabetes mellitus with hyperglycemia (HCC)   Hx of BKA, left (HCC)   Major depressive disorder, recurrent severe without psychotic features (HCC)   Obesity (BMI 30-39.9)  S/p L THA for FN fx 04/02/22   Post op recs: WB: WBAT LLE, No formal hip precautions Abx: ancef Imaging: PACU pelvis Xray Dressing: Aquacell, keep intact until follow up DVT prophylaxis: Aspirin 81BID starting POD1 Follow up: 2 weeks after surgery for a wound check with Dr. Zachery Dakins at [facility].  Address: [address] [place], [place], [place] [address]  Office Phone: [phone]      Willaim Sheng 04/04/2022, 7:13 AM   Charlies Constable, MD  Contact information:    [phone] 7am-5pm epic message Dr. Zachery Dakins, or call office for patient follow up: [phone] After hours and holidays please check [email or web address] for group call information for [facility]

## 2022-04-04 NOTE — Progress Notes (Addendum)
Blood Glucose 61.  2 Units of OJ given.  Will re check in 15 minutes  Blood sugar rechecked. 65.  Will give two more units of juice

## 2022-04-04 NOTE — Progress Notes (Signed)
Pt unsure if and when he last urinated.  Pt bladder scanned with over 385 in bladder.  I personally think it is more due to pts abdomen size.   Pt strait cathed w/ 900 amber urine output.  Pt is very appreciative.  Educated pt on trying to urinate on day shift and make sure that staff records it.

## 2022-04-04 NOTE — Progress Notes (Signed)
Physical Therapy Treatment Patient Details Name: Thomas Frye MRN: [id number] DOB: 16-Dec-1955 Today's Date: 04/04/2022   History of Present Illness 67 y.o. male admitted on 04/02/22 for Fall with resultant L hip fx.  Pt s/p L posterior THA.  Per physician's note "no formal hip precautions" and WBAT post op.  Pt with significant PMH of L BKA (was ambulatory with prosthesis PTA--at SNF), DM, DKA, CVAs (per pt multiple).    PT Comments    Pt is progressing towards goals. Pt was able to stand EOB this session with EOB significantly elevated without prosthetic with 2 person assist and AD due to unable to don prosthetic secondary to edema on operative leg in residual limb. Pt shrinker sock was donned in order to enable donning of prosthetic later. Pt is currently heavy assist for bed mobility and unable to transfer with heavy assistance. Due to pt current level of function recommending skilled physical therapy services in SNF setting on discharge from acute care hospital setting in order to decrease risk for immobility, falls, injury and re-hospitalization. Pt required O2 during mobility but at supine was on room are 94% O2 sat.    Recommendations for follow up therapy are one component of a multi-disciplinary discharge planning process, led by the attending physician.  Recommendations may be updated based on patient status, additional functional criteria and insurance authorization.  Follow Up Recommendations  Skilled nursing-short term rehab (<3 hours/day) Can patient physically be transported by private vehicle: No   Assistance Recommended at Discharge Frequent or constant Supervision/Assistance  Patient can return home with the following Two people to help with walking and/or transfers;Two people to help with bathing/dressing/bathroom;Assistance with cooking/housework;Assistance with feeding;Direct supervision/assist for medications management;Direct supervision/assist for financial  management;Assist for transportation;Help with stairs or ramp for entrance   Equipment Recommendations  Wheelchair (measurements PT);Wheelchair cushion (measurements PT);Hospital bed;Other (comment)    Recommendations for Other Services       Precautions / Restrictions Precautions Precautions: Fall;Other (comment) (no formal precautions; maintained posterior hip precautionary) Precaution Comments: h/o falls, L BKA with prosthesis in room Required Braces or Orthoses: Other Brace Other Brace: L LE prosthesis in room Restrictions Weight Bearing Restrictions: Yes LLE Weight Bearing: Weight bearing as tolerated     Mobility  Bed Mobility Overal bed mobility: Needs Assistance Bed Mobility: Supine to Sit, Sit to Supine     Supine to sit: Max assist, HOB elevated, +2 for physical assistance Sit to supine: Max assist, HOB elevated, +2 for physical assistance   General bed mobility comments: Pt requires Max A to come to sitting EOB due to difficulty moving the LLE and need for assistance at the trunk. He then requires Max A to help rotate hips to get sitting EOB. Initially Min A on first sitting to maintain sitting balance leaning heavily to the R but was able to slowly improve as the L hip relaxed into flexion. Patient Response: Flat affect  Transfers Overall transfer level: Needs assistance Equipment used: Rolling walker (2 wheels) Transfers: Sit to/from Stand Sit to Stand: +2 physical assistance, Mod assist, From elevated surface           General transfer comment: bed was elevated significantly due to unable to don prosthetic secondary to swelling in the L residual limb from surgery. Compression sock was donned to help with donning for next attempt.    Ambulation/Gait               General Gait Details: Unable to attempt at this time  due to current functional status.       Balance Overall balance assessment: Needs assistance Sitting-balance support: Feet  supported, No upper extremity supported, Bilateral upper extremity supported Sitting balance-Leahy Scale: Fair Sitting balance - Comments: Pt was Min A initially sitting EOB but able to progress to SBA. Postural control: Right lateral lean Standing balance support: Bilateral upper extremity supported, Reliant on assistive device for balance Standing balance-Leahy Scale: Zero Standing balance comment: Mod A to remain in upright position with heavy leaning toward the L with no prosthetic limb on and unable to correct.                            Cognition Arousal/Alertness: Awake/alert Behavior During Therapy: Flat affect Overall Cognitive Status: History of cognitive impairments - at baseline           General Comments: slow to process, likely baseline post stroke.  Generally flat affect very thankful throughout session        Exercises      General Comments General comments (skin integrity, edema, etc.): Pt is very fatigued and painful. Able to participate in functional activity. RN provided pain meds during session. Pt O2 was off when entering room, turned up to 2 L due to drop to 88% with activity. Once back in bed O2 to Room aie and pt at 94% O2 sat      Pertinent Vitals/Pain Pain Assessment Pain Assessment: Faces Faces Pain Scale: Hurts even more Breathing: normal Negative Vocalization: occasional moan/groan, low speech, negative/disapproving quality Facial Expression: sad, frightened, frown Body Language: tense, distressed pacing, fidgeting Consolability: distracted or reassured by voice/touch PAINAD Score: 4 Pain Location: left hip Pain Descriptors / Indicators: Guarding, Grimacing Pain Intervention(s): Limited activity within patient's tolerance, Monitored during session, RN gave pain meds during session, Repositioned    \\  PT Goals (current goals can now be found in the care plan section) Acute Rehab PT Goals Patient Stated Goal: to get well enough to  go to ALF PT Goal Formulation: With patient/family Time For Goal Achievement: 04/17/22 Potential to Achieve Goals: Good Progress towards PT goals: Progressing toward goals    Frequency    Min 3X/week      PT Plan Current plan remains appropriate    \\   AM-PAC PT "6 Clicks" Mobility   Outcome Measure  Help needed turning from your back to your side while in a flat bed without using bedrails?: Total Help needed moving from lying on your back to sitting on the side of a flat bed without using bedrails?: Total Help needed moving to and from a bed to a chair (including a wheelchair)?: Total Help needed standing up from a chair using your arms (e.g., wheelchair or bedside chair)?: Total Help needed to walk in hospital room?: Total Help needed climbing 3-5 steps with a railing? : Total 6 Click Score: 6    End of Session Equipment Utilized During Treatment: Oxygen;Gait belt Activity Tolerance: Patient limited by pain;Patient limited by fatigue Patient left: in bed;with call bell/phone within reach;with family/visitor present Nurse Communication: Mobility status PT Visit Diagnosis: Muscle weakness (generalized) (M62.81);Difficulty in walking, not elsewhere classified (R26.2);Pain Pain - Right/Left: Left Pain - part of body: Hip     Time: 0930-1004 PT Time Calculation (min) (ACUTE ONLY): 34 min  Charges:  $Therapeutic Activity: 23-37 mins                    Santina Evans  Adam Phenix, [place], [place]  Acute Rehabilitation Services Office: [phone] (Secure chat preferred)    Ander Purpura 04/04/2022, 10:46 AM

## 2022-04-04 NOTE — Progress Notes (Addendum)
Subjective:   Summary: Thomas Frye is a 67 y.o. year old male currently admitted on the IMTS HD#2 for left femoral neck fracture after mechanical fall.  Overnight Events: No overnight events  Patient was seen this a.m. bedside. He is drowsy on exam, and had to be woken up multiple times. Similar to yesterday AM presentation. He states he is eating well, and is having some pain in his left leg but nowhere else. He is able to follow commands, and is responsive to questioning.  Objective:  Vital signs in last 24 hours: Vitals:   04/03/22 2100 04/04/22 0516 04/04/22 0600 04/04/22 0805  BP: (!) 128/92 (!) 92/50 (!) 101/55 128/65  Pulse: 77 83  86  Resp: 20   18  Temp: 98.4 F (36.9 C)   97.7 F (36.5 C)  TempSrc: Oral   Oral  SpO2: 96%   99%  Weight:      Height:       Supplemental O2: Nasal Cannula SpO2: 99 % O2 Flow Rate (L/min): 2 L/min   Physical Exam:  Constitutional: Drowsy appearing male, in and out of sleep, easily arousable, laying in bed, in no acute distress Cardiovascular: RRR, no murmurs, rubs or gallops Pulmonary/Chest: normal work of breathing on room air, lungs clear to auscultation bilaterally Abdominal: soft, non-tender, non-distended Skin: warm and dry Extremities: L BKA,  Aquacel intact on left extremity  Filed Weights   04/02/22 0614 04/02/22 1150  Weight: 115.2 kg 115.2 kg     Intake/Output Summary (Last 24 hours) at 04/04/2022 1335 Last data filed at 04/04/2022 2952 Gross per 24 hour  Intake 480 ml  Output 900 ml  Net -420 ml    Net IO Since Admission: -824.27 mL [04/04/22 1335]  Pertinent Labs:    Latest Ref Rng & Units 04/04/2022   11:46 AM 04/03/2022    3:26 AM 04/02/2022    7:14 AM  CBC  WBC 4.0 - 10.5 K/uL 9.6  9.7  10.0   Hemoglobin 13.0 - 17.0 g/dL 8.5  9.2  11.2   Hematocrit 39.0 - 52.0 % 25.1  27.6  32.7   Platelets 150 - 400 K/uL 158  191  196        Latest Ref Rng & Units 04/04/2022   11:46 AM 04/03/2022     3:26 AM 04/02/2022    7:14 AM  CMP  Glucose 70 - 99 mg/dL 84  287  330   BUN 8 - 23 mg/dL 30  27  21    Creatinine 0.61 - 1.24 mg/dL 1.32  1.62  1.18   Sodium 135 - 145 mmol/L 134  129  129   Potassium 3.5 - 5.1 mmol/L 3.6  4.3  4.4   Chloride 98 - 111 mmol/L 101  96  96   CO2 22 - 32 mmol/L 26  24  24    Calcium 8.9 - 10.3 mg/dL 8.5  8.4  8.7       Imaging: No results found.   Assessment/Plan:   Principal Problem:   Closed displaced fracture of left femoral neck (HCC) Active Problems:   OSA (obstructive sleep apnea)   Type 2 diabetes mellitus with hyperglycemia (HCC)   Hx of BKA, left (HCC)   Major depressive disorder, recurrent severe without psychotic features ([place])   Obesity (BMI 30-39.9)   Patient Summary: Thomas Frye is a 67 year old w/ PMH of T1DM  with retinopathy and polyneuropathy, OSA, CVA, left BKA, and depression who presented to the ER for further management of left hip fracture from a mechanical fall    #Left femoral neck fracture status post total hip arthroplasty Day 2 Patient has history of left BKA, and fell at SNF.  He was evaluated by orthopedic surgery, and is currently two days s/p total hip arthroplasty.  Surgery went well, with no complications. Pt continued to appear drowsy in the AM today. Yesterday upon re-evaluation in the afternoon he was much more aware, alert, and communicative. Could be an effect of the multiple sedative medications patient takes, or just general fatigue and just waking up. He has been working with physical therapy and they say he is progressing towards goals. He is currently heavy assist for bed mobility and unable to transfer with heavy assistance. PT recommends return to SNF. During ambulation pt's O2 dropped to 88% and was then put on room air with 94% saturation.    Plan: - PT/OT evaluation - Tylenol 1000 mg 3 times daily, oxycodone 5 mg every 6 hours as needed for moderate, severe pain -  DVT prophylaxis with  aspirin+brilinta (home meds) and low dose lovenox while hospitalized.  #Acute Hypoxic Respiratory Failure  Pt desaturated to 71% yesterday, and required 3L of [place] this AM. This was weaned down during encounter and pt was saturating well at rest on room air. While working with physical therapy today, pt desaturated to 88% and needed to be put on 2L of [place] for resolution to O2 at 94%. Initial chest x-ray showed mild interstitial edema, and pt did not have increased oxygen requirements. Will get repeat chest x-ray to re-evaluate on why pt is desaturating.   Plan:  - F/U Chest X-Ray  # Type 1 diabetes #Diabetic neuropathy #Pancreatic insufficiency Last A1c was 7.7% 4 months ago, current A1c is 8.3%.  Patient has a history of type 1 diabetes mellitus sees an endocrinologist for management.  Home regimen includes Semglee 38 units daily, in addition to an insulin pump for sliding scale. Fasting glucose this morning hovering around 250, will adjust insulin regimen.   Plan:  - Increase Semglee 42U - Increase mealtime to 15U - SSI with meals - Continue creon supplementation before meals   #AKI Creatinine was increased from 1.1 to 1.6.  Patient had an episode of hypotension in the OR.  Creatinine has improved to 1.32, will continue to monitor.   #Urinary Retention  Pt bladder scan over night showed a volume of 325 mL, catheter was used and removed 900cc of urine. Repeat bladder scan in the afternoon showed a volume of 210. Currently has not had good output. Will obtain another bladder scan in the PM, and re-evaluate. Could be in the setting of anticholinergic use such as amitriptyline.   Plan:  - Discontinue amitriptyline  - Repeat bladder scan  - Encourage output   #Vitamin D Deficiency  Vitamin D level found to be 13.25, will start patient on cholecalciferol 1000 units  #Heart failure with preserved ejection fraction #Hypertension Patient's blood pressure remained stable around 110/56.   Hold lisinopril in the setting of AKI.  Last echo in May 2023 showed EF of 60 to 65%, mild LVH and grade 1 diastolic dysfunction.  Currently looks euvolemic on exam.  # Depression Patient's home regimen includes Seroquel 50 mg, trazodone 100 mg, amitriptyline 25 mg, and Effexor 150 mg with breakfast.  We have discontinued amitriptyline, and will decrease seroquel to 25mg  as  pt has been very drowsy in the AM.   #Anemia of Chronic Disease Anemia of chronic disease revealed on iron studies obtained yesterday.  Likely in the setting of chronic comorbidities.  Patient has never had colonoscopy, denies any bloody stools.     Diet: Normal IVF: LR,Bolus Code: Full   Dispo: Anticipated discharge to Skilled nursing facility in 2 days pending medical management.   Olegario Messier, MD PGY-1 Internal Medicine Resident Pager Number [phone] Please contact the on call pager after 5 pm and on weekends at [phone].

## 2022-04-05 DIAGNOSIS — S72002A Fracture of unspecified part of neck of left femur, initial encounter for closed fracture: Secondary | ICD-10-CM | POA: Diagnosis not present

## 2022-04-05 DIAGNOSIS — E1165 Type 2 diabetes mellitus with hyperglycemia: Secondary | ICD-10-CM

## 2022-04-05 DIAGNOSIS — Z794 Long term (current) use of insulin: Secondary | ICD-10-CM

## 2022-04-05 DIAGNOSIS — R339 Retention of urine, unspecified: Secondary | ICD-10-CM | POA: Diagnosis not present

## 2022-04-05 DIAGNOSIS — Z8673 Personal history of transient ischemic attack (TIA), and cerebral infarction without residual deficits: Secondary | ICD-10-CM

## 2022-04-05 LAB — GLUCOSE, CAPILLARY
Glucose-Capillary: 342 mg/dL — ABNORMAL HIGH (ref 70–99)
Glucose-Capillary: 265 mg/dL — ABNORMAL HIGH (ref 70–99)
Glucose-Capillary: 326 mg/dL — ABNORMAL HIGH (ref 70–99)
Glucose-Capillary: 338 mg/dL — ABNORMAL HIGH (ref 70–99)
Glucose-Capillary: 311 mg/dL — ABNORMAL HIGH (ref 70–99)
Glucose-Capillary: 237 mg/dL — ABNORMAL HIGH (ref 70–99)
Glucose-Capillary: 178 mg/dL — ABNORMAL HIGH (ref 70–99)

## 2022-04-05 LAB — CBC
HCT: 23.8 % — ABNORMAL LOW (ref 39.0–52.0)
Hemoglobin: 8.1 g/dL — ABNORMAL LOW (ref 13.0–17.0)
MCH: 33.2 pg (ref 26.0–34.0)
MCHC: 34 g/dL (ref 30.0–36.0)
MCV: 97.5 fL (ref 80.0–100.0)
Platelets: 158 10*3/uL (ref 150–400)
RBC: 2.44 MIL/uL — ABNORMAL LOW (ref 4.22–5.81)
RDW: 12.8 % (ref 11.5–15.5)
WBC: 7.1 10*3/uL (ref 4.0–10.5)
nRBC: 0 % (ref 0.0–0.2)

## 2022-04-05 LAB — BASIC METABOLIC PANEL
Anion gap: 9 (ref 5–15)
BUN: 25 mg/dL — ABNORMAL HIGH (ref 8–23)
CO2: 25 mmol/L (ref 22–32)
Calcium: 8.3 mg/dL — ABNORMAL LOW (ref 8.9–10.3)
Chloride: 98 mmol/L (ref 98–111)
Creatinine, Ser: 1.14 mg/dL (ref 0.61–1.24)
GFR, Estimated: >60 mL/min (ref >60)
Glucose, Bld: 322 mg/dL — ABNORMAL HIGH (ref 70–99)
Potassium: 4.2 mmol/L (ref 3.5–5.1)
Sodium: 132 mmol/L — ABNORMAL LOW (ref 135–145)

## 2022-04-05 MED ORDER — DOXAZOSIN MESYLATE 1 MG PO TABS
1.0000 mg | ORAL_TABLET | Freq: Every day | ORAL | Status: DC
  Filled 2022-04-05: qty 1

## 2022-04-05 MED ORDER — DOXAZOSIN MESYLATE 1 MG PO TABS
0.5000 mg | ORAL_TABLET | Freq: Every day | ORAL | Status: DC
  Administered 2022-04-05 – 2022-04-07 (×3): 0.5 mg via ORAL
  Filled 2022-04-05 (×3): qty 0.5

## 2022-04-05 MED ORDER — INSULIN GLARGINE-YFGN 100 UNIT/ML ~~LOC~~ SOLN
24.0000 [IU] | Freq: Every day | SUBCUTANEOUS | Status: DC
  Filled 2022-04-05: qty 0.24

## 2022-04-05 MED ORDER — INSULIN GLARGINE-YFGN 100 UNIT/ML ~~LOC~~ SOLN
26.0000 [IU] | Freq: Every day | SUBCUTANEOUS | Status: DC
  Filled 2022-04-05: qty 0.26

## 2022-04-05 MED ORDER — CLOPIDOGREL BISULFATE 75 MG PO TABS
75.0000 mg | ORAL_TABLET | Freq: Every day | ORAL | Status: DC
  Administered 2022-04-05 – 2022-04-08 (×4): 75 mg via ORAL
  Filled 2022-04-05 (×4): qty 1

## 2022-04-05 MED ORDER — INSULIN GLARGINE-YFGN 100 UNIT/ML ~~LOC~~ SOLN
38.0000 [IU] | Freq: Every day | SUBCUTANEOUS | Status: DC
  Administered 2022-04-05: 38 [IU] via SUBCUTANEOUS
  Filled 2022-04-05 (×2): qty 0.38

## 2022-04-05 MED ORDER — INSULIN ASPART 100 UNIT/ML IJ SOLN
5.0000 [IU] | Freq: Three times a day (TID) | INTRAMUSCULAR | Status: DC
  Administered 2022-04-05 (×2): 5 [IU] via SUBCUTANEOUS

## 2022-04-05 NOTE — Progress Notes (Signed)
     Subjective:  Patient reports pain as mild.  PT went better yesterday but unable to apply prosthesis due to lower extremity swelling. Has compression stocking on stump today which will hopefully help reduce the distal swelling.   Objective:   VITALS:   Vitals:   04/04/22 1606 04/04/22 2115 04/05/22 0425 04/05/22 0805  BP: (!) 142/72 (!) 154/68 132/73 137/82  Pulse: 100 100 84 85  Resp: 18 16 18 17   Temp: 98.4 F (36.9 C) 99 F (37.2 C) 98 F (36.7 C) 97.6 F (36.4 C)  TempSrc: Oral Oral Oral Oral  SpO2: 99% 97% 97% 100%  Weight:      Height:        Incision: dressing C/D/I Compartment soft   Lab Results  Component Value Date   WBC 7.1 04/05/2022   HGB 8.1 (L) 04/05/2022   HCT 23.8 (L) 04/05/2022   MCV 97.5 04/05/2022   PLT 158 04/05/2022   BMET    Component Value Date/Time   NA 132 (L) 04/05/2022 0348   K 4.2 04/05/2022 0348   CL 98 04/05/2022 0348   CO2 25 04/05/2022 0348   GLUCOSE 322 (H) 04/05/2022 0348   BUN 25 (H) 04/05/2022 0348   CREATININE 1.14 04/05/2022 0348   CALCIUM 8.3 (L) 04/05/2022 0348   GFRNONAA >60 04/05/2022 0348      Xray: THA components good position no adverse features  Assessment/Plan: 3 Days Post-Op   Principal Problem:   Closed displaced fracture of left femoral neck (HCC) Active Problems:   OSA (obstructive sleep apnea)   Type 2 diabetes mellitus with hyperglycemia (HCC)   Hx of BKA, left (HCC)   Major depressive disorder, recurrent severe without psychotic features (HCC)   Obesity (BMI 30-39.9)  S/p L THA for FN fx 04/02/22   Post op recs: WB: WBAT LLE, No formal hip precautions Abx: ancef Imaging: PACU pelvis Xray Dressing: Aquacell, keep intact until follow up DVT prophylaxis: Aspirin 81BID starting POD1 Follow up: 2 weeks after surgery for a wound check with Dr. Zachery Dakins at [facility].  Address: [address] [place], [place], [place] [address]  Office Phone: [phone]      Thomas Frye 04/05/2022, 8:07 AM   Charlies Constable, MD  Contact information:   [phone] 7am-5pm epic message Dr. Zachery Dakins, or call office for patient follow up: [phone] After hours and holidays please check [email or web address] for group call information for [facility]

## 2022-04-05 NOTE — Care Management Important Message (Signed)
Important Message  Patient Details  Name: Thomas Frye MRN: [id number] Date of Birth: 1955/09/14   Medicare Important Message Given:  Yes     Ailene Royal [address] 04/05/2022, 2:52 PM

## 2022-04-05 NOTE — Inpatient Diabetes Management (Signed)
Inpatient Diabetes Program Recommendations  AACE/ADA: New Consensus Statement on Inpatient Glycemic Control (2015)  Target Ranges:  Prepandial:   less than 140 mg/dL      Peak postprandial:   less than 180 mg/dL (1-2 hours)      Critically ill patients:  140 - 180 mg/dL   Lab Results  Component Value Date   GLUCAP 311 (H) 04/05/2022   HGBA1C 8.3 (H) 04/02/2022    Review of Glycemic Control  Latest Reference Range & Units 04/04/22 08:04 04/04/22 12:03 04/04/22 16:04 04/04/22 17:13 04/04/22 18:11 04/04/22 20:34 04/04/22 21:12 04/05/22 00:41 04/05/22 04:22 04/05/22 08:03 04/05/22 11:29  Glucose-Capillary 70 - 99 mg/dL 162 (H)  Novolog 18 units 92  Novolog 15 units 61 (L) 65 (L) 123 (H) 265 (H) 279 (H) 338 (H) 342 (H) 326 (H)  Novolog 11 units 311 (H)   Diabetes history: DM type 1 Outpatient Diabetes medications:  Semglee 38 units Omnipod insulin pump for bolusing for correction and meal coverage Libre 3 CGM  Carbohydrate coverage 1 units for every 2.5 grams of carbohydrates Sensitivity: 1 units drops glucose 20 points Target glucose 120  Current orders for Inpatient glycemic control:  Semglee 24 units qhs Novolog 0-15 units tid  Novolog 5 units tid meal coverage   Inpatient Diabetes Program Recommendations:    Based on glucose trends and insulin doses the past 2 days consider. Fasting glucose trends were at goal, however, Insulin dropped after Novolog doses.  -   increasing Semglee to home dose 38 units (fasting glucose looked good on 38 units) -   Add Novolog bedtime scale   Watch on the current sliding scale and meal coverage  Thanks,  Tama Headings RN, MSN, BC-ADM Inpatient Diabetes Coordinator Team Pager [phone] (8a-5p)

## 2022-04-05 NOTE — TOC Progression Note (Addendum)
Transition of Care [facility]) - Progression Note    Patient Details  Name: NOLON YELLIN MRN: [id number] Date of Birth: 04/08/1955  [facility]) CM/SW Contact  Joanne Chars, LCSW Phone Number: 04/05/2022, 1:32 PM  Clinical Narrative:    CSW spoke with [facility].  Pt has been there for LTC and does have active medicaid.  Message from [place].  Pt Medicaid is active: [id number] K.  Per [facility], they can offer rehab bed and pt could stay for LTC afterwards.  It will be a semi private room.  1345: CSW LM with pt son Annie Main requesting call back.   1500: TC son Annie Main, he does want to accept offer at [facility], is OK with semi private room, as he is in semi private currently.  Expected Discharge Plan: [place] Barriers to Discharge: Continued Medical Work up  Expected Discharge Plan and Services In-house Referral: Clinical Social Work Discharge Planning Services: CM Consult Post Acute Care Choice: [place] Living arrangements for the past 2 months: [place]                 DME Arranged: N/A DME Agency: NA       HH Arranged: NA [place] Agency: NA         Social Determinants of Health (SDOH) Interventions SDOH Screenings   Tobacco Use: Low Risk  (04/04/2022)    Readmission Risk Interventions    08/11/2021    1:04 PM  Readmission Risk Prevention Plan  Transportation Screening Complete  PCP or Specialist Appt within 5-7 Days Complete  Home Care Screening Complete  Medication Review (RN CM) Complete

## 2022-04-05 NOTE — Progress Notes (Addendum)
Subjective:   Summary: Thomas Frye is a 67 y.o. year old male currently admitted on the IMTS HD#3 for left femoral neck fracture after mechanical fall.  Overnight Events: No overnight events   Patient was seen this a.m. bedside.  Says his breathing is doing well, does not feel that he needs his oxygen.  He worked with PT more yesterday and he said it went well and he is excited to do more physical therapy.  Left leg pain is still present, however he did not feel like he was short of breath while he was working with physical therapy.  He says he is not urinating well, and states he has not had Foley catheter before.  He stays well-hydrated at home and says he drinks 7-8 bottles of water a day.  He denies any chest pain, shortness of breath, nausea, vomiting.    Objective:  Vital signs in last 24 hours: Vitals:   04/04/22 2115 04/05/22 0425 04/05/22 0805 04/05/22 1409  BP: (!) 154/68 132/73 137/82 128/68  Pulse: 100 84 85 92  Resp: 16 18 17 19   Temp: 99 F (37.2 C) 98 F (36.7 C) 97.6 F (36.4 C) 98.2 F (36.8 C)  TempSrc: Oral Oral Oral Oral  SpO2: 97% 97% 100% 95%  Weight:      Height:       Supplemental O2: Nasal Cannula SpO2: 95 % O2 Flow Rate (L/min): 1 L/min   Physical Exam:  Constitutional: Well-appearing male, laying in bed, in no acute distress Cardiovascular: RRR, no murmurs, rubs or gallops Pulmonary/Chest: normal work of breathing on room air, lungs clear to auscultation bilaterally Abdominal: soft, non-tender, non-distended Skin: warm and dry Extremities: L BKA,  Aquacel intact on left extremity  Filed Weights   04/02/22 0614 04/02/22 1150  Weight: 115.2 kg 115.2 kg     Intake/Output Summary (Last 24 hours) at 04/05/2022 1410 Last data filed at 04/05/2022 0900 Gross per 24 hour  Intake 120 ml  Output 1650 ml  Net -1530 ml    Net IO Since Admission: -2,354.27 mL [04/05/22 1410]  Pertinent Labs:    Latest Ref Rng & Units  04/05/2022    3:48 AM 04/04/2022   11:46 AM 04/03/2022    3:26 AM  CBC  WBC 4.0 - 10.5 K/uL 7.1  9.6  9.7   Hemoglobin 13.0 - 17.0 g/dL 8.1  8.5  9.2   Hematocrit 39.0 - 52.0 % 23.8  25.1  27.6   Platelets 150 - 400 K/uL 158  158  191        Latest Ref Rng & Units 04/05/2022    3:48 AM 04/04/2022   11:46 AM 04/03/2022    3:26 AM  CMP  Glucose 70 - 99 mg/dL 322  84  287   BUN 8 - 23 mg/dL 25  30  27    Creatinine 0.61 - 1.24 mg/dL 1.14  1.32  1.62   Sodium 135 - 145 mmol/L 132  134  129   Potassium 3.5 - 5.1 mmol/L 4.2  3.6  4.3   Chloride 98 - 111 mmol/L 98  101  96   CO2 22 - 32 mmol/L 25  26  24    Calcium 8.9 - 10.3 mg/dL 8.3  8.5  8.4       Imaging: DG Chest 2 View  Result Date: 04/04/2022 CLINICAL DATA:  Dyspnea EXAM: CHEST -  2 VIEW COMPARISON:  None Available. FINDINGS: Loop recorder device overlies the left chest. The heart size and mediastinal contours are within normal limits. Both lungs are clear. The visualized skeletal structures are unremarkable. IMPRESSION: No active cardiopulmonary disease. Electronically Signed   By: Darliss Cheney M.D.   On: 04/04/2022 22:04     Assessment/Plan:   Principal Problem:   Closed displaced fracture of left femoral neck (HCC) Active Problems:   OSA (obstructive sleep apnea)   Type 2 diabetes mellitus with hyperglycemia (HCC)   Hx of BKA, left (HCC)   Major depressive disorder, recurrent severe without psychotic features (HCC)   Obesity (BMI 30-39.9)   Patient Summary: Thomas Frye is a 67 year old w/ PMH of T1DM with retinopathy and polyneuropathy, OSA, CVA, left BKA, and depression who presented to the ER for further management of left hip fracture from a mechanical fall    #Left femoral neck fracture status post total hip arthroplasty Day 2 Patient has history of left BKA, and fell at SNF.  He was evaluated by orthopedic surgery, and is currently two days s/p total hip arthroplasty.  Surgery went well, with no complications. Pt was  not drowsy this a.m. on encounter.  Physical therapist and working with him, per their notes he is doing better and is improving.  He states he does not feel the need for oxygen.  Will continue to work with PT, and continue his pain regimen.  Of note, after his last CVA in May, he was recommended to continue aspirin and Brilinta for 3 weeks and then Plavix only lifelong.  However it seems that he may have not received the Plavix.  Discussed with Ortho addition of Plavix to his DVT prophylaxis medication of aspirin and Brilinta.  Also discussed with pharmacy as patient has history of CVA.  Will discontinue the Brilinta and start the Plavix.   Plan: - Continue work with physical therapy, Occupational Therapy - Tylenol 1000 mg 3 times daily, oxycodone 5 mg every 6 hours as needed for moderate, severe pain -Stop Brilinta, DVT prophylaxis with aspirin and Plavix.  And low dose lovenox while hospitalized.  Can consider switching to Plavix in the outpatient setting.  #Acute Hypoxic Respiratory Failure, suspect 2/2 post-operative atelectasis Patient desaturated to 88% while working with physical therapy yesterday, needed to be put on 2 L of oxygen.  Patient has been bedbound, follow-up chest x-ray did not show any acute cardiopulmonary processes.  Likely atelectasis in the setting of patient being bedbound post-operatively.  He is currently on 1 L of supplemental oxygen.  Patient said he pushed himself while working with physical therapy, which could allow for desaturation in the setting of exertion.  Plan:  -Ambulation saturation prior to discharge  # Type 1 diabetes #Diabetic neuropathy #Pancreatic insufficiency Last A1c was 7.7% 4 months ago, current A1c is 8.3%.  Patient has a history of type 1 diabetes mellitus sees an endocrinologist for management.  Home regimen includes Semglee 38 units daily, in addition to an insulin pump for sliding scale. Insulin adjusted down yesterday in the setting of  hypoglycemia, likely secondary to excess rapid-acting insulin. Fasting glucose this morning hovering around 300, will adjust insulin regimen.   Plan:  - Nightly 24 units long-acting - 5 units mealtime - SSI with meals - Continue creon supplementation before meals   #AKI (resolved) Creatinine was increased from 1.1 to 1.6.  Patient had an episode of hypotension in the OR.  Creatinine has improved to 1.14, will continue to  monitor.   #Urinary Retention  Pt bladder scan over night showed a volume of 625 mL, catheter was used and removed 600 cc of urine.  Foley was placed last night.  Patient has multiple risk factors for urinary retention, and on discussion with son he has required multiple Foley placements in the past.  Will do a voiding trial tomorrow, and start doxazosin.  This was a medication that he was given previously however was discontinued.  Will restart and see if output improves.    Plan:  - Doxazosin 0.5 mg daily - Voiding trial tomorrow  #Vitamin D Deficiency  Vitamin D level found to be 13.25, will start patient on cholecalciferol 1000 units  #Heart failure with preserved ejection fraction #Hypertension Patient's blood pressure remained stable around 110/56.  Hold lisinopril in the setting of AKI.  Last echo in May 2023 showed EF of 60 to 65%, mild LVH and grade 1 diastolic dysfunction.  Currently looks euvolemic on exam.  #Depression Patient's home regimen includes Seroquel 50 mg, trazodone 100 mg, amitriptyline 25 mg, and Effexor 150 mg with breakfast.  We have discontinued amitriptyline and decreased seroquel to 25mg  for AM drowsiness.  #Anemia of Chronic Disease Anemia of chronic disease revealed on iron studies obtained yesterday.  Likely in the setting of chronic comorbidities.  Patient has never had colonoscopy, denies any bloody stools.     Diet: Normal IVF: LR,Bolus Code: Full   Dispo: Anticipated discharge to Skilled nursing facility in 2 days pending  medical management.   , MD PGY-1 Internal Medicine Resident Pager Number [phone] Please contact the on call pager after 5 pm and on weekends at [phone].

## 2022-04-06 DIAGNOSIS — S72002A Fracture of unspecified part of neck of left femur, initial encounter for closed fracture: Secondary | ICD-10-CM | POA: Diagnosis not present

## 2022-04-06 DIAGNOSIS — R339 Retention of urine, unspecified: Secondary | ICD-10-CM | POA: Diagnosis not present

## 2022-04-06 DIAGNOSIS — E1165 Type 2 diabetes mellitus with hyperglycemia: Secondary | ICD-10-CM | POA: Diagnosis not present

## 2022-04-06 DIAGNOSIS — Z794 Long term (current) use of insulin: Secondary | ICD-10-CM | POA: Diagnosis not present

## 2022-04-06 LAB — CBC
HCT: 25.1 % — ABNORMAL LOW (ref 39.0–52.0)
Hemoglobin: 8.1 g/dL — ABNORMAL LOW (ref 13.0–17.0)
MCH: 32 pg (ref 26.0–34.0)
MCHC: 32.3 g/dL (ref 30.0–36.0)
MCV: 99.2 fL (ref 80.0–100.0)
Platelets: 232 10*3/uL (ref 150–400)
RBC: 2.53 MIL/uL — ABNORMAL LOW (ref 4.22–5.81)
RDW: 12.7 % (ref 11.5–15.5)
WBC: 7.1 10*3/uL (ref 4.0–10.5)
nRBC: 0 % (ref 0.0–0.2)

## 2022-04-06 LAB — BASIC METABOLIC PANEL
Anion gap: 9 (ref 5–15)
BUN: 24 mg/dL — ABNORMAL HIGH (ref 8–23)
CO2: 25 mmol/L (ref 22–32)
Calcium: 8.6 mg/dL — ABNORMAL LOW (ref 8.9–10.3)
Chloride: 99 mmol/L (ref 98–111)
Creatinine, Ser: 1.14 mg/dL (ref 0.61–1.24)
GFR, Estimated: >60 mL/min (ref >60)
Glucose, Bld: 233 mg/dL — ABNORMAL HIGH (ref 70–99)
Potassium: 3.9 mmol/L (ref 3.5–5.1)
Sodium: 133 mmol/L — ABNORMAL LOW (ref 135–145)

## 2022-04-06 LAB — GLUCOSE, CAPILLARY
Glucose-Capillary: 228 mg/dL — ABNORMAL HIGH (ref 70–99)
Glucose-Capillary: 239 mg/dL — ABNORMAL HIGH (ref 70–99)
Glucose-Capillary: 257 mg/dL — ABNORMAL HIGH (ref 70–99)
Glucose-Capillary: 306 mg/dL — ABNORMAL HIGH (ref 70–99)

## 2022-04-06 MED ORDER — INSULIN ASPART 100 UNIT/ML IJ SOLN
8.0000 [IU] | Freq: Three times a day (TID) | INTRAMUSCULAR | Status: DC
  Administered 2022-04-06 – 2022-04-07 (×4): 8 [IU] via SUBCUTANEOUS

## 2022-04-06 MED ORDER — INSULIN GLARGINE-YFGN 100 UNIT/ML ~~LOC~~ SOLN
42.0000 [IU] | Freq: Every day | SUBCUTANEOUS | Status: DC
  Administered 2022-04-06: 42 [IU] via SUBCUTANEOUS
  Filled 2022-04-06 (×2): qty 0.42

## 2022-04-06 NOTE — Progress Notes (Signed)
Patient refused CPAP for the night  

## 2022-04-06 NOTE — TOC Progression Note (Addendum)
Transition of Care [facility]) - Progression Note    Patient Details  Name: TAITUM ALMS MRN: [id number] Date of Birth: 1955/12/14  Transition of Care [facility] CM/SW Contact  Joanne Chars, [place] Phone Number: 04/06/2022, 10:41 AM  Clinical Narrative:   [place] called, needs to see if meds from [facility] can be picked up and brought with pt as medicaid will not refill early.  Creon and Brilinta in particular are needed, but if could get all meds that would help.  CSW spoke with [facility] and she will look into this.  1100: CSW met with son Annie Main and ex [name] in room, discussed the above, they can get pt belongings and meds from [place].  She spoke with unit mgr, they cannot give the medications but will return them to the pharmacy, which should credit them back to pt medicaid  1230: auth request submitted in [place]. Pt name in Navi: [place]   Expected Discharge Plan: [place] Barriers to Discharge: Continued Medical Work up  Expected Discharge Plan and Services In-house Referral: Clinical Social Work Discharge Planning Services: CM Consult Post Acute Care Choice: [place] Living arrangements for the past 2 months: [place]                 DME Arranged: N/A DME Agency: NA       HH Arranged: NA [place] Agency: NA         Social Determinants of Health (SDOH) Interventions SDOH Screenings   Tobacco Use: Low Risk  (04/04/2022)    Readmission Risk Interventions    08/11/2021    1:04 PM  Readmission Risk Prevention Plan  Transportation Screening Complete  PCP or Specialist Appt within 5-7 Days Complete  Home Care Screening Complete  Medication Review (RN CM) Complete

## 2022-04-06 NOTE — Progress Notes (Signed)
     Subjective: Patient reports pain as mild.  Did not have PT yesterday. Eager to mobilize more. Hgb low but stable. Will continue just aspirin for DVT prophylaxis.  Objective:   VITALS:   Vitals:   04/05/22 0805 04/05/22 1409 04/05/22 2116 04/06/22 0517  BP: 137/82 128/68 117/69 123/67  Pulse: 85 92 100 79  Resp: 17 19 17 16   Temp: 97.6 F (36.4 C) 98.2 F (36.8 C) 98.3 F (36.8 C) 98.3 F (36.8 C)  TempSrc: Oral Oral Oral Oral  SpO2: 100% 95% 100%   Weight:      Height:        Incision: dressing C/D/I Compartment soft   Lab Results  Component Value Date   WBC 7.1 04/06/2022   HGB 8.1 (L) 04/06/2022   HCT 25.1 (L) 04/06/2022   MCV 99.2 04/06/2022   PLT 232 04/06/2022   BMET    Component Value Date/Time   NA 133 (L) 04/06/2022 0313   K 3.9 04/06/2022 0313   CL 99 04/06/2022 0313   CO2 25 04/06/2022 0313   GLUCOSE 233 (H) 04/06/2022 0313   BUN 24 (H) 04/06/2022 0313   CREATININE 1.14 04/06/2022 0313   CALCIUM 8.6 (L) 04/06/2022 0313   GFRNONAA >60 04/06/2022 0313      Xray: THA components good position no adverse features  Assessment/Plan: 4 Days Post-Op   Principal Problem:   Closed displaced fracture of left femoral neck (HCC) Active Problems:   OSA (obstructive sleep apnea)   Type 2 diabetes mellitus with hyperglycemia (HCC)   Hx of BKA, left (HCC)   Major depressive disorder, recurrent severe without psychotic features (HCC)   Obesity (BMI 30-39.9)   Urinary retention   History of stroke  S/p L THA for FN fx 04/02/22   Post op recs: WB: WBAT LLE, No formal hip precautions Abx: ancef Imaging: PACU pelvis Xray Dressing: Aquacell, keep intact until follow up DVT prophylaxis: Aspirin 81BID starting POD1 Follow up: 2 weeks after surgery for a wound check with Dr. Zachery Dakins at [facility].  Address: [address] [place], [place], [place] [address]  Office Phone: [phone]      Thomas Frye 04/06/2022, 6:30  AM   Charlies Constable, MD  Contact information:   [phone] 7am-5pm epic message Dr. Zachery Dakins, or call office for patient follow up: [phone] After hours and holidays please check [email or web address] for group call information for [facility]

## 2022-04-06 NOTE — Progress Notes (Signed)
Subjective:   Summary: Thomas Frye is a 67 y.o. year old male currently admitted on the IMTS HD#4 for left femoral neck fracture after mechanical fall.  Overnight Events: No overnight events   Patient was seen this a.m. bedside with son.  He says his breathing is doing well.  He works with PT, and is motivated to work more with them.  He says his pain has decreased, and is lying comfortably in his bed.  He does not want to go back to [place], and is very interested in relocating to a different skilled nursing facility.  He says he is breathing well, and has been using his incentive spirometer.   Objective:  Vital signs in last 24 hours: Vitals:   04/06/22 0517 04/06/22 0948 04/06/22 0951 04/06/22 1539  BP: 123/67 (!) 144/79 (!) 144/79 111/68  Pulse: 79  88 90  Resp: 16  18 19   Temp: 98.3 F (36.8 C)  98.3 F (36.8 C) 98.2 F (36.8 C)  TempSrc: Oral  Oral Oral  SpO2:   94% 97%  Weight:      Height:       Supplemental O2: Nasal Cannula SpO2: 97 % O2 Flow Rate (L/min): 1 L/min   Physical Exam:  Constitutional: Well-appearing male, laying in bed, in no acute distress Cardiovascular: RRR, no murmurs, rubs or gallops Pulmonary/Chest: normal work of breathing on room air, lungs clear to auscultation bilaterally Abdominal: soft, non-tender, non-distended Skin: warm and dry Extremities: L BKA,  Aquacel intact on left extremity  Filed Weights   04/02/22 0614 04/02/22 1150  Weight: 115.2 kg 115.2 kg     Intake/Output Summary (Last 24 hours) at 04/06/2022 1600 Last data filed at 04/06/2022 1500 Gross per 24 hour  Intake 240 ml  Output 200 ml  Net 40 ml    Net IO Since Admission: -2,194.27 mL [04/06/22 1600]  Pertinent Labs:    Latest Ref Rng & Units 04/06/2022    3:13 AM 04/05/2022    3:48 AM 04/04/2022   11:46 AM  CBC  WBC 4.0 - 10.5 K/uL 7.1  7.1  9.6   Hemoglobin 13.0 - 17.0 g/dL 8.1  8.1  8.5   Hematocrit 39.0 - 52.0 % 25.1  23.8   25.1   Platelets 150 - 400 K/uL 232  158  158        Latest Ref Rng & Units 04/06/2022    3:13 AM 04/05/2022    3:48 AM 04/04/2022   11:46 AM  CMP  Glucose 70 - 99 mg/dL 233  322  84   BUN 8 - 23 mg/dL 24  25  30    Creatinine 0.61 - 1.24 mg/dL 1.14  1.14  1.32   Sodium 135 - 145 mmol/L 133  132  134   Potassium 3.5 - 5.1 mmol/L 3.9  4.2  3.6   Chloride 98 - 111 mmol/L 99  98  101   CO2 22 - 32 mmol/L 25  25  26    Calcium 8.9 - 10.3 mg/dL 8.6  8.3  8.5        Assessment/Plan:   Principal Problem:   Closed displaced fracture of left femoral neck (HCC) Active Problems:   OSA (obstructive sleep apnea)   Type 2 diabetes mellitus with hyperglycemia (HCC)   Hx of BKA, left (HCC)   Major depressive disorder, recurrent severe without psychotic features ([place])  Obesity (BMI 30-39.9)   Urinary retention   History of stroke   Patient Summary: Thomas Frye is a 67 year old w/ PMH of T1DM with retinopathy and polyneuropathy, OSA, CVA, left BKA, and depression who presented to the ER for further management of left hip fracture from a mechanical fall    #Left femoral neck fracture status post total hip arthroplasty Day 4 Patient has history of left BKA, and fell at SNF.  He was evaluated by orthopedic surgery, and is currently 4 days s/p total hip arthroplasty.  Surgery went well, with no complications.  Patient is only required 1 dose of his oxycodone yesterday.  He continues to work with physical therapy, and further notes he is progressing towards goals, as he is able to sit with less assistance.  Will continue DVT prophylaxis with aspirin twice daily Plavix.  Will transition to just Plavix in a month.     Plan: - Continue work with physical therapy, Occupational Therapy - Tylenol 1000 mg 3 times daily - DVT prophylaxis with aspirin and Plavix.  And low dose lovenox while hospitalized.  Can consider switching to Plavix in the outpatient setting.  #Acute Hypoxic Respiratory Failure,  suspect 2/2 post-operative atelectasis  During encounter today, oxygen was slowly weaned off and patient felt comfortable taking his oxygen off.  However per monitor he was desaturating to about 88%.  So he was put back on 2 L.  Believe this is secondary to postoperative atelectasis, and encourage patient to continue using incentive spirometer.  Plan:  - Ambulation saturation prior to discharge - Incentive spirometer daily  # Type 1 diabetes #Diabetic neuropathy #Pancreatic insufficiency Last A1c was 7.7% 4 months ago, current A1c is 8.3%.  Patient has a history of type 1 diabetes mellitus sees an endocrinologist for management.  Home regimen includes Semglee 38 units daily, in addition to an insulin pump for sliding scale. Insulin adjusted down yesterday in the setting of hypoglycemia, likely secondary to excess rapid-acting insulin. Fasting glucose this morning hovering around 250, will adjust insulin regimen.   Plan:  - Nightly 42 units long-acting - 8 units mealtime - SSI with meals - Continue creon supplementation before meals   #AKI (resolved) Creatinine was increased from 1.1 to 1.6.  Patient had an episode of hypotension in the OR.  Creatinine has improved to 1.14, will continue to monitor.   #Urinary Retention  Patient has had a Foley in for the last 2 days.  Do voiding trial today, with bladder scans every 4 hours.  Continue doxazosin as well.  Plan:  - Doxazosin 0.5 mg daily - Voiding trial  - Every 4 hour bladder scans  #Vitamin D Deficiency  Vitamin D level found to be 13.25, will start patient on cholecalciferol 74mcg 1000 units  #Heart failure with preserved ejection fraction #Hypertension Patient's blood pressure remained stable around 110/56.  Hold lisinopril in the setting of AKI.  Last echo in May 2023 showed EF of 60 to 65%, mild LVH and grade 1 diastolic dysfunction.  Currently looks euvolemic on exam.  #Depression Patient's home regimen includes Seroquel 50  mg, trazodone 100 mg, amitriptyline 25 mg, and Effexor 150 mg with breakfast.  We have discontinued amitriptyline and decreased seroquel to 25mg  for AM drowsiness.  #Anemia of Chronic Disease Anemia of chronic disease revealed on iron studies obtained yesterday.  Likely in the setting of chronic comorbidities.  Patient has never had colonoscopy, denies any bloody stools.     Diet: Normal IVF: LR,Bolus Code: Full  Dispo: Anticipated discharge to Skilled nursing facility in 2 days pending medical management.   Olegario Messier, MD PGY-1 Internal Medicine Resident Pager Number [phone] Please contact the on call pager after 5 pm and on weekends at [phone].

## 2022-04-06 NOTE — Progress Notes (Signed)
Physical Therapy Treatment Patient Details Name: Thomas Frye MRN: [id number] DOB: 02/27/1956 Today's Date: 04/06/2022   History of Present Illness 67 y.o. male admitted on 04/02/22 for Fall with resultant L hip fx.  Pt s/p L posterior THA.  Per physician's note "no formal hip precautions" and WBAT post op.  Pt with significant PMH of L BKA (was ambulatory with prosthesis PTA--at SNF), DM, DKA, CVAs (per pt multiple).    PT Comments    Pt is progressing towards goals. Pt was able to sit EOB with less assistance and was able to transfer from EOB to recliner. Pt is impulsive and due to generalized weakness in bil LE his knees begin to buckle during transfer and pt does not attempt to correct he wants to sit. Pt educated that this increases his risk for falls and he needs to try to communicate prior to sitting. Due to pt current functional status, available assistance at home, PLOF recommending skilled physical therapy services in SNF setting on discharge from acute care hospital setting in order to decrease risk for falls, immobility, injury and re-hospitalization. Pt O2 sats dropped to 86% sitting EOB O2 increased to 2 L during standing/transfer. Once in chair pt O2 sats on 1 L at 93-94% consistently from 87-89% when lying in the bed.     Recommendations for follow up therapy are one component of a multi-disciplinary discharge planning process, led by the attending physician.  Recommendations may be updated based on patient status, additional functional criteria and insurance authorization.  Follow Up Recommendations  Skilled nursing-short term rehab (<3 hours/day) Can patient physically be transported by private vehicle: No   Assistance Recommended at Discharge Frequent or constant Supervision/Assistance  Patient can return home with the following Two people to help with walking and/or transfers;Assist for transportation;Help with stairs or ramp for entrance   Equipment Recommendations   Wheelchair (measurements PT);Wheelchair cushion (measurements PT);Hospital bed;Other (comment) (defer to post acute)    Recommendations for Other Services       Precautions / Restrictions Precautions Precautions: Fall;Other (comment) (no formal precautions maintaining posterior hip) Precaution Comments: h/o falls, L BKA with prosthesis in room Required Braces or Orthoses: Other Brace Other Brace: L LE prosthesis in room Restrictions Weight Bearing Restrictions: Yes LLE Weight Bearing: Weight bearing as tolerated     Mobility  Bed Mobility Overal bed mobility: Needs Assistance Bed Mobility: Supine to Sit     Supine to sit: Mod assist, HOB elevated     General bed mobility comments: Pt needs multi modal cueing for sequencing with bed mobility and assistance at the bil LE. Pt is able to push trunk up into sitting from side lying position with verbal cues once LE are near EOB. Patient Response: Flat affect  Transfers Overall transfer level: Needs assistance Equipment used: Rolling walker (2 wheels) Transfers: Sit to/from Stand Sit to Stand: Mod assist           General transfer comment: Pt was elevated and stacked thick blankets in chair for ease of sit<>stand due to pt height. Compression sock was donned when pt received today and pt was able to don his prosthetic. Pt knees start to buckle after ~1 min in standing. Pt is impulsive and will suddenly start trying to sit. Chair was pulled closer when pt knees started to buckle so when pt started trying to sit chair was right there and pt was able to sit in chair requiring Mod A to align hips within parameters of the chair.  Ambulation/Gait Ambulation/Gait assistance: Mod assist Gait Distance (Feet): 2 Feet Assistive device: Rolling walker (2 wheels) Gait Pattern/deviations: Step-to pattern, Decreased step length - right, Decreased step length - left, Shuffle   Gait velocity interpretation: <1.31 ft/sec, indicative of  household ambulator   General Gait Details: Pt does not always clear LLE from the floor, during standing/ambulation. Very small steps and multiple steps. Pt begins to crouch gait after ~ 1 min due to fatigue.   Stairs Stairs:  (unsafe at this time.)               Balance Overall balance assessment: Needs assistance Sitting-balance support: Feet supported, No upper extremity supported, Bilateral upper extremity supported Sitting balance-Leahy Scale: Fair Sitting balance - Comments: Pt requires no physical assist this session sitting EOB.   Standing balance support: Bilateral upper extremity supported, Reliant on assistive device for balance   Standing balance comment: Mod A to remain in upright position with heavy leaning toward the L with  prosthetic limb on and unable to correct with heavy multi modal cueing.          Cognition Arousal/Alertness: Awake/alert Behavior During Therapy: Flat affect Overall Cognitive Status: History of cognitive impairments - at baseline         General Comments: slow to process, likely baseline post stroke.  Generally flat affect continued to be very thankful throughout session           General Comments General comments (skin integrity, edema, etc.): Pt stops communicating when standing/transfering and pt was educated that he needs to communicate. He states that he is concentrating very hard. O2 was increased to 2L Due to decrease to 86% prior to mobility. Once sitting in chair O2 back down to 1 L and pt at 94% O2 sats improved from 89% in the bed on 1 L. On 2 L pt O2 sats remained above 90% throughout transfer      Pertinent Vitals/Pain Pain Assessment Pain Assessment: Faces Faces Pain Scale: Hurts little more Breathing: normal Negative Vocalization: none Facial Expression: smiling or inexpressive Body Language: tense, distressed pacing, fidgeting Consolability: no need to console PAINAD Score: 1 Facial Expression: Relaxed,  neutral Body Movements: Protection Muscle Tension: Tense, rigid Pain Location: left hip Pain Descriptors / Indicators: Guarding, Grimacing Pain Intervention(s): Limited activity within patient's tolerance, Monitored during session, Repositioned     PT Goals (current goals can now be found in the care plan section) Acute Rehab PT Goals Patient Stated Goal: to get well enough to go to ALF PT Goal Formulation: With patient/family Time For Goal Achievement: 04/17/22 Potential to Achieve Goals: Good Progress towards PT goals: Progressing toward goals    Frequency    Min 3X/week      PT Plan Current plan remains appropriate       AM-PAC PT "6 Clicks" Mobility   Outcome Measure  Help needed turning from your back to your side while in a flat bed without using bedrails?: A Lot Help needed moving from lying on your back to sitting on the side of a flat bed without using bedrails?: A Lot Help needed moving to and from a bed to a chair (including a wheelchair)?: A Lot Help needed standing up from a chair using your arms (e.g., wheelchair or bedside chair)?: A Lot Help needed to walk in hospital room?: Total Help needed climbing 3-5 steps with a railing? : Total 6 Click Score: 10    End of Session Equipment Utilized During Treatment: Oxygen;Gait belt Activity  Tolerance: Patient limited by pain;Patient limited by fatigue Patient left: with call bell/phone within reach;in chair;with chair alarm set Nurse Communication: Mobility status PT Visit Diagnosis: Muscle weakness (generalized) (M62.81);Difficulty in walking, not elsewhere classified (R26.2);Pain Pain - Right/Left: Left Pain - part of body: Hip     Time: 1240-1319 PT Time Calculation (min) (ACUTE ONLY): 39 min  Charges:  $Therapeutic Activity: 38-52 mins                    Harrel Carina, DPT, CLT  Acute Rehabilitation Services Office: [phone] (Secure chat preferred)    Claudia Desanctis 04/06/2022, 1:44  PM

## 2022-04-06 NOTE — Inpatient Diabetes Management (Signed)
Inpatient Diabetes Program Recommendations  AACE/ADA: New Consensus Statement on Inpatient Glycemic Control   Target Ranges:  Prepandial:   less than 140 mg/dL      Peak postprandial:   less than 180 mg/dL (1-2 hours)      Critically ill patients:  140 - 180 mg/dL    Latest Reference Range & Units 04/05/22 08:03 04/05/22 11:29 04/05/22 16:13 04/05/22 19:49 04/06/22 00:05 04/06/22 04:45 04/06/22 07:29  Glucose-Capillary 70 - 99 mg/dL 326 (H) 311 (H) 237 (H) 178 (H) 228 (H) 239 (H) 306 (H)   Review of Glycemic Control  Diabetes history: DM Outpatient Diabetes medications: Semglee 38 units daily, OmniPod for bolusing for correction and meal coverage; Libre 3 CGM Current orders for Inpatient glycemic control: Semglee 38 units QHS, 8 TID with meals, Novolog 0-15 units TID with meals  Inpatient Diabetes Program Recommendations:    Insulin: Please consider increasing Semglee 42 units QHS, Novolog 10 units TID with meals, and adding Novolog 0-5 units QHS.  Thanks, Barnie Alderman, RN, MSN, [place] Diabetes Coordinator Inpatient Diabetes Program [phone] (Team Pager from 8am to [place])

## 2022-04-06 NOTE — TOC CAGE-AID Note (Signed)
Transition of Care [facility]) - CAGE-AID Screening   Patient Details  Name: Thomas Frye MRN: [id number] Date of Birth: Feb 21, 1956  [facility] of [facility]) CM/SW Contact:    Army Melia, RN Phone Number:[phone] 04/06/2022, 3:18 AM   CAGE-AID Screening:    Have You Ever Felt You Ought to Cut Down on Your Drinking or Drug Use?: No Have People Annoyed You By Critizing Your Drinking Or Drug Use?: No Have You Felt Bad Or Guilty About Your Drinking Or Drug Use?: No Have You Ever Had a Drink or Used Drugs First Thing In The Morning to Steady Your Nerves or to Get Rid of a Hangover?: No CAGE-AID Score: 0  Substance Abuse Education Offered: No

## 2022-04-06 NOTE — Plan of Care (Signed)

## 2022-04-07 DIAGNOSIS — R339 Retention of urine, unspecified: Secondary | ICD-10-CM | POA: Diagnosis not present

## 2022-04-07 DIAGNOSIS — Z794 Long term (current) use of insulin: Secondary | ICD-10-CM | POA: Diagnosis not present

## 2022-04-07 DIAGNOSIS — S72002A Fracture of unspecified part of neck of left femur, initial encounter for closed fracture: Secondary | ICD-10-CM | POA: Diagnosis not present

## 2022-04-07 DIAGNOSIS — E1165 Type 2 diabetes mellitus with hyperglycemia: Secondary | ICD-10-CM | POA: Diagnosis not present

## 2022-04-07 LAB — GLUCOSE, CAPILLARY
Glucose-Capillary: 192 mg/dL — ABNORMAL HIGH (ref 70–99)
Glucose-Capillary: 260 mg/dL — ABNORMAL HIGH (ref 70–99)
Glucose-Capillary: 345 mg/dL — ABNORMAL HIGH (ref 70–99)
Glucose-Capillary: 345 mg/dL — ABNORMAL HIGH (ref 70–99)
Glucose-Capillary: 285 mg/dL — ABNORMAL HIGH (ref 70–99)
Glucose-Capillary: 316 mg/dL — ABNORMAL HIGH (ref 70–99)

## 2022-04-07 LAB — CBC
HCT: 24.2 % — ABNORMAL LOW (ref 39.0–52.0)
Hemoglobin: 8.2 g/dL — ABNORMAL LOW (ref 13.0–17.0)
MCH: 32.9 pg (ref 26.0–34.0)
MCHC: 33.9 g/dL (ref 30.0–36.0)
MCV: 97.2 fL (ref 80.0–100.0)
Platelets: 245 10*3/uL (ref 150–400)
RBC: 2.49 MIL/uL — ABNORMAL LOW (ref 4.22–5.81)
RDW: 12.5 % (ref 11.5–15.5)
WBC: 6.4 10*3/uL (ref 4.0–10.5)
nRBC: 0 % (ref 0.0–0.2)

## 2022-04-07 LAB — BASIC METABOLIC PANEL
Anion gap: 9 (ref 5–15)
BUN: 20 mg/dL (ref 8–23)
CO2: 26 mmol/L (ref 22–32)
Calcium: 8.7 mg/dL — ABNORMAL LOW (ref 8.9–10.3)
Chloride: 101 mmol/L (ref 98–111)
Creatinine, Ser: 0.96 mg/dL (ref 0.61–1.24)
GFR, Estimated: >60 mL/min (ref >60)
Glucose, Bld: 199 mg/dL — ABNORMAL HIGH (ref 70–99)
Potassium: 3.5 mmol/L (ref 3.5–5.1)
Sodium: 136 mmol/L (ref 135–145)

## 2022-04-07 MED ORDER — INSULIN ASPART 100 UNIT/ML IJ SOLN
10.0000 [IU] | Freq: Three times a day (TID) | INTRAMUSCULAR | Status: DC
  Administered 2022-04-07 – 2022-04-08 (×4): 10 [IU] via SUBCUTANEOUS

## 2022-04-07 MED ORDER — DOXAZOSIN MESYLATE 1 MG PO TABS
1.0000 mg | ORAL_TABLET | Freq: Every day | ORAL | Status: DC
  Administered 2022-04-08: 1 mg via ORAL
  Filled 2022-04-07: qty 1

## 2022-04-07 MED ORDER — INSULIN GLARGINE-YFGN 100 UNIT/ML ~~LOC~~ SOLN
46.0000 [IU] | Freq: Every day | SUBCUTANEOUS | Status: DC
  Administered 2022-04-07: 46 [IU] via SUBCUTANEOUS
  Filled 2022-04-07 (×2): qty 0.46

## 2022-04-07 MED ORDER — CHLORHEXIDINE GLUCONATE CLOTH 2 % EX PADS
6.0000 | MEDICATED_PAD | Freq: Every day | CUTANEOUS | Status: DC
  Administered 2022-04-08: 6 via TOPICAL

## 2022-04-07 MED ORDER — POLYETHYLENE GLYCOL 3350 17 G PO PACK
17.0000 g | PACK | Freq: Every day | ORAL | Status: DC
  Administered 2022-04-07 – 2022-04-08 (×2): 17 g via ORAL
  Filled 2022-04-07 (×2): qty 1

## 2022-04-07 MED ORDER — SENNOSIDES-DOCUSATE SODIUM 8.6-50 MG PO TABS
1.0000 | ORAL_TABLET | Freq: Two times a day (BID) | ORAL | Status: DC
  Administered 2022-04-07 – 2022-04-08 (×3): 1 via ORAL
  Filled 2022-04-07 (×3): qty 1

## 2022-04-07 NOTE — Progress Notes (Addendum)
Subjective:   Summary: Thomas Frye is a 67 y.o. year old male currently admitted on the IMTS HD#5 for left femoral neck fracture after mechanical fall.  Overnight Events: Scan showed volume over 400, patient did not want to be catheterized  Patient was seen this a.m. bedside.  He states his bladder is feeling full, and has not been able to urinate on his own besides having an episode yesterday.  Urine yesterday was not able to be collected as patient had an accident, but he says it was a fair amount.  He says physical therapy is going well, and he has been having trouble getting back into bed from chair.  States he feels the Foley is uncomfortable, but is accepting if necessary.  He says he is breathing well, and is consistent with his use of the incentive spirometer.  Objective:  Vital signs in last 24 hours: Vitals:   04/06/22 1539 04/06/22 2025 04/07/22 0355 04/07/22 0804  BP: 111/68 134/68 132/68 (!) 141/71  Pulse: 90 (!) 103 80 86  Resp: 19 19 16 18   Temp: 98.2 F (36.8 C) 98.9 F (37.2 C) 98 F (36.7 C) 97.8 F (36.6 C)  TempSrc: Oral Oral Oral Oral  SpO2: 97% 92% 96% 95%  Weight:      Height:       Supplemental O2: Room Air 96%    Physical Exam:  Constitutional: Well-appearing male, laying in bed, in no acute distress Cardiovascular: RRR, no murmurs, rubs or gallops Pulmonary/Chest: normal work of breathing on room air, lungs clear to auscultation bilaterally Abdominal: soft, non-tender, non-distended Skin: warm and dry Extremities: L BKA,  Aquacel intact on left extremity  Filed Weights   04/02/22 0614 04/02/22 1150  Weight: 115.2 kg 115.2 kg     Intake/Output Summary (Last 24 hours) at 04/07/2022 1326 Last data filed at 04/06/2022 2220 Gross per 24 hour  Intake 480 ml  Output 200 ml  Net 280 ml    Net IO Since Admission: -1,834.27 mL [04/07/22 1326]  Pertinent Labs:    Latest Ref Rng & Units 04/07/2022    3:42 AM 04/06/2022     3:13 AM 04/05/2022    3:48 AM  CBC  WBC 4.0 - 10.5 K/uL 6.4  7.1  7.1   Hemoglobin 13.0 - 17.0 g/dL 8.2  8.1  8.1   Hematocrit 39.0 - 52.0 % 24.2  25.1  23.8   Platelets 150 - 400 K/uL 245  232  158        Latest Ref Rng & Units 04/07/2022    3:42 AM 04/06/2022    3:13 AM 04/05/2022    3:48 AM  CMP  Glucose 70 - 99 mg/dL 199  233  322   BUN 8 - 23 mg/dL 20  24  25    Creatinine 0.61 - 1.24 mg/dL 0.96  1.14  1.14   Sodium 135 - 145 mmol/L 136  133  132   Potassium 3.5 - 5.1 mmol/L 3.5  3.9  4.2   Chloride 98 - 111 mmol/L 101  99  98   CO2 22 - 32 mmol/L 26  25  25    Calcium 8.9 - 10.3 mg/dL 8.7  8.6  8.3        Assessment/Plan:   Principal Problem:   Closed displaced fracture of left femoral neck (HCC) Active Problems:   OSA (obstructive sleep apnea)  Type 2 diabetes mellitus with hyperglycemia (HCC)   Hx of BKA, left (HCC)   Major depressive disorder, recurrent severe without psychotic features (HCC)   Obesity (BMI 30-39.9)   Urinary retention   History of stroke   Patient Summary: [name] elder age,  A 67 year old w/ PMH of T1DM with retinopathy and polyneuropathy, OSA, CVA, left BKA, and depression who presented to the ER for further management of left hip fracture from a mechanical fall    #Urinary Retention  Patient underwent a voiding trial yesterday and was able to urinate once on his own.  Unable to be measured, however patient says it was at least 200 to 300 mL.  However since then, patient has not been able to urinate on his own and was feeling like his bladder was full.  Last bladder scan this morning showed 451 mL, and required in and out cath for resolution w/ 925 mL output.  Will increase doxazosin to 1 mg, and reevaluate if patient needs Foley and urology follow-up for removal.  Patient has multiple risk factors for urinary retention, including BPH, recent surgery, history of CVA, and uncontrolled type 1 diabetes.   Plan:  - Doxazosin 1 mg daily - Every  4 hour bladder scans - Can consider discharge with foley and urology follow up for removal    #Type 1 diabetes #Diabetic neuropathy #Pancreatic insufficiency Last A1c was 7.7% 4 months ago, current A1c is 8.3%.  Patient has a history of type 1 diabetes mellitus sees an endocrinologist for management.  Home regimen includes Semglee 38 units daily, in addition to an insulin pump for sliding scale. Fasting glucose this morning hovering around 280, will adjust insulin regimen.   Plan:  - Nightly 46 units long-acting - 10 units mealtime - SSI with meals - Continue creon supplementation before meals   #Left femoral neck fracture status post total hip arthroplasty Day 5 Patient has history of left BKA, and fell at SNF.  He was evaluated by orthopedic surgery, and is currently 4 days s/p total hip arthroplasty.  Surgery went well, with no complications.  Pain is well controlled. He continues to work with physical therapy, and further notes he is progressing towards goals, as he is able to sit with less assistance.  Will continue DVT prophylaxis with aspirin twice daily and Plavix.  Will transition to just Plavix in a month.  Plan: - Continue work with physical therapy, Occupational Therapy - Tylenol 1000 mg 3 times daily - DVT prophylaxis with aspirin and Plavix.    #Acute Hypoxic Respiratory Failure, suspect 2/2 post-operative atelectasis  Oxygen saturation >92% on RA over the last 24H. Believe prior small oxygen requirements were secondary to postoperative atelectasis. Continue using incentive spirometer.  Plan:  - Ambulation saturation prior to discharge - Incentive spirometer daily  #AKI (resolved) Creatinine was increased from 1.1 to 1.6.  Patient had an episode of hypotension in the OR.  Creatinine has improved to .96, will continue to monitor.   #Vitamin D Deficiency  Vitamin D level found to be 13.25, started patient on cholecalciferol 1000 units  #Heart failure with  preserved ejection fraction #Hypertension Patient's blood pressure remained stable around 110/56.  Hold lisinopril in the setting of AKI.  Last echo in May 2023 showed EF of 60 to 65%, mild LVH and grade 1 diastolic dysfunction.  Currently looks euvolemic on exam.  #Depression Patient's home regimen includes Seroquel 50 mg, trazodone 100 mg, amitriptyline 25 mg, and Effexor 150 mg with  breakfast.  We have discontinued amitriptyline and decreased seroquel to 25mg  for AM drowsiness.  #Anemia of Chronic Disease Anemia of chronic disease revealed on iron studies obtained yesterday.  Likely in the setting of chronic comorbidities.  Patient has never had colonoscopy, denies any bloody stools.     Diet: Normal IVF: LR,Bolus Code: Full   Dispo: Anticipated discharge to Skilled nursing facility in 1 days pending medical management.   Drucie Opitz, MD PGY-1 Internal Medicine Resident Pager Number [phone] Please contact the on call pager after 5 pm and on weekends at [phone].

## 2022-04-07 NOTE — TOC Progression Note (Addendum)
Transition of Care [facility]) - Progression Note    Patient Details  Name: Thomas Frye MRN: [id number] Date of Birth: July 19, 1955  Transition of Care [facility]) CM/SW Contact  Joanne Chars, LCSW Phone Number: 04/07/2022, 11:51 AM  Clinical Narrative:  SNF auth approved: [id number], 6 days: 1/11-1/16.  MD informed.     Per MD, pt may need to DC with foley, informed Cherie at [facility], this is OK as long as it is addressed in the DC summary.   Expected Discharge Plan: [place] Barriers to Discharge: Continued Medical Work up  Expected Discharge Plan and Services In-house Referral: Clinical Social Work Discharge Planning Services: CM Consult Post Acute Care Choice: [place] Living arrangements for the past 2 months: [place]                 DME Arranged: N/A DME Agency: NA       HH Arranged: NA [place] Agency: NA         Social Determinants of Health (SDOH) Interventions SDOH Screenings   Tobacco Use: Low Risk  (04/04/2022)    Readmission Risk Interventions    08/11/2021    1:04 PM  Readmission Risk Prevention Plan  Transportation Screening Complete  PCP or Specialist Appt within 5-7 Days Complete  Home Care Screening Complete  Medication Review (RN CM) Complete

## 2022-04-07 NOTE — Progress Notes (Signed)
Mobility Specialist Progress Note   04/07/22 1554  Mobility  Activity Transferred from chair to bed  Level of Assistance Moderate assist, patient does 50-74%  Assistive Device Front wheel walker  Distance Ambulated (ft) 2 ft  LLE Weight Bearing WBAT  Activity Response Tolerated well  Mobility Referral Yes  $Mobility charge 1 Mobility   Pt requesting to get back to bed. ModA to stand from low height, pt having problem w/ final extension w/ standing upright. Able to pivot to EOB w/ minA. Mod cues to get back in bed while adhering to precautions for safety and comfort. Left supine w/ call bell in reach and bed alarm on.    Holland Falling Mobility Specialist Please contact via SecureChat or  Rehab office at [phone]

## 2022-04-07 NOTE — Progress Notes (Signed)
Mobility Specialist Progress Note   04/07/22 1500  Mobility  Activity Ambulated with assistance in room;Transferred from bed to chair  Level of Assistance Moderate assist, patient does 50-74%  Assistive Device Front wheel walker  Distance Ambulated (ft) 4 ft  LLE Weight Bearing WBAT  Activity Response Tolerated well  Mobility Referral Yes  $Mobility charge 1 Mobility   Received in bed c/o pain(5/10) but pt eager for mobility. Able to get EOB w/ increased time but requiring minA to put on prosthetic sleeve and prosthetic. MinA to stand from an elevated surface but modA to ambulate 48ft d/t LLE weakness and dec pain tolerance. Chair follow behind pt for safety, pt fatigued quickly and requesting to sit. ModA to descend into chair d/t limited control but no faults. Once seated, pt able to perform x5 seated exercise w/ decent strength but mobility limited on LLE. Left in chair w/ call bell in reach and chair alarm on.     Holland Falling Mobility Specialist Please contact via SecureChat or  Rehab office at [phone]

## 2022-04-07 NOTE — TOC Progression Note (Signed)
Transition of Care [facility]) - Progression Note    Patient Details  Name: Thomas Frye MRN: [id number] Date of Birth: May 05, 1955  Transition of Care [facility]) CM/SW Contact  Joanne Chars, LCSW Phone Number: 04/07/2022, 9:52 AM  Clinical Narrative:   Josem Kaufmann still pending in [place].    Expected Discharge Plan: [place] Barriers to Discharge: Continued Medical Work up  Expected Discharge Plan and Services In-house Referral: Clinical Social Work Discharge Planning Services: CM Consult Post Acute Care Choice: [place] Living arrangements for the past 2 months: [place]                 DME Arranged: N/A DME Agency: NA       HH Arranged: NA [place] Agency: NA         Social Determinants of Health (SDOH) Interventions SDOH Screenings   Tobacco Use: Low Risk  (04/04/2022)    Readmission Risk Interventions    08/11/2021    1:04 PM  Readmission Risk Prevention Plan  Transportation Screening Complete  PCP or Specialist Appt within 5-7 Days Complete  Home Care Screening Complete  Medication Review (RN CM) Complete

## 2022-04-07 NOTE — Progress Notes (Signed)
Refused cpap.

## 2022-04-07 NOTE — Progress Notes (Addendum)
Pt refused to be strait cathed @ this time.  Pt stated that he wants to try and go on his own.  On call MD is aware.   Pt still refused strait cath

## 2022-04-08 LAB — GLUCOSE, CAPILLARY
Glucose-Capillary: 203 mg/dL — ABNORMAL HIGH (ref 70–99)
Glucose-Capillary: 212 mg/dL — ABNORMAL HIGH (ref 70–99)
Glucose-Capillary: 236 mg/dL — ABNORMAL HIGH (ref 70–99)
Glucose-Capillary: 237 mg/dL — ABNORMAL HIGH (ref 70–99)
Glucose-Capillary: 263 mg/dL — ABNORMAL HIGH (ref 70–99)

## 2022-04-08 LAB — CBC
HCT: 24.1 % — ABNORMAL LOW (ref 39.0–52.0)
Hemoglobin: 8.2 g/dL — ABNORMAL LOW (ref 13.0–17.0)
MCH: 32.9 pg (ref 26.0–34.0)
MCHC: 34 g/dL (ref 30.0–36.0)
MCV: 96.8 fL (ref 80.0–100.0)
Platelets: 282 10*3/uL (ref 150–400)
RBC: 2.49 MIL/uL — ABNORMAL LOW (ref 4.22–5.81)
RDW: 12.6 % (ref 11.5–15.5)
WBC: 7 10*3/uL (ref 4.0–10.5)
nRBC: 0 % (ref 0.0–0.2)

## 2022-04-08 LAB — BASIC METABOLIC PANEL
Anion gap: 8 (ref 5–15)
BUN: 18 mg/dL (ref 8–23)
CO2: 28 mmol/L (ref 22–32)
Calcium: 8.8 mg/dL — ABNORMAL LOW (ref 8.9–10.3)
Chloride: 99 mmol/L (ref 98–111)
Creatinine, Ser: 0.95 mg/dL (ref 0.61–1.24)
GFR, Estimated: >60 mL/min (ref >60)
Glucose, Bld: 206 mg/dL — ABNORMAL HIGH (ref 70–99)
Potassium: 3.7 mmol/L (ref 3.5–5.1)
Sodium: 135 mmol/L (ref 135–145)

## 2022-04-08 MED ORDER — PANTOPRAZOLE SODIUM 40 MG PO TBEC: 40.0000 mg | DELAYED_RELEASE_TABLET | Freq: Every day | ORAL | Status: AC

## 2022-04-08 MED ORDER — POLYETHYLENE GLYCOL 3350 17 G PO PACK: 17.0000 g | PACK | Freq: Every day | ORAL | 0 refills | Status: AC

## 2022-04-08 MED ORDER — DOCUSATE SODIUM 100 MG PO CAPS
100.0000 mg | ORAL_CAPSULE | Freq: Two times a day (BID) | ORAL | Status: DC
  Administered 2022-04-08: 100 mg via ORAL
  Filled 2022-04-08: qty 1

## 2022-04-08 MED ORDER — QUETIAPINE FUMARATE 25 MG PO TABS: 25.0000 mg | ORAL_TABLET | Freq: Every day | ORAL | Status: AC

## 2022-04-08 MED ORDER — CLOPIDOGREL BISULFATE 75 MG PO TABS: 75.0000 mg | ORAL_TABLET | Freq: Every day | ORAL | Status: AC

## 2022-04-08 MED ORDER — SENNOSIDES-DOCUSATE SODIUM 8.6-50 MG PO TABS: 2.0000 | ORAL_TABLET | Freq: Two times a day (BID) | ORAL | Status: DC

## 2022-04-08 MED ORDER — INSULIN ASPART 100 UNIT/ML IJ SOLN: 10.0000 [IU] | Freq: Three times a day (TID) | INTRAMUSCULAR | 11 refills | Status: AC

## 2022-04-08 MED ORDER — DOXAZOSIN MESYLATE 1 MG PO TABS: 1.0000 mg | ORAL_TABLET | Freq: Every day | ORAL | Status: AC

## 2022-04-08 MED ORDER — SENNOSIDES-DOCUSATE SODIUM 8.6-50 MG PO TABS: 1.0000 | ORAL_TABLET | Freq: Two times a day (BID) | ORAL | Status: AC

## 2022-04-08 MED ORDER — ASPIRIN 81 MG PO TBEC: 81.0000 mg | DELAYED_RELEASE_TABLET | Freq: Two times a day (BID) | ORAL | 0 refills | Status: AC

## 2022-04-08 MED ORDER — BISACODYL 10 MG RE SUPP: 10.0000 mg | Freq: Every day | RECTAL | Status: DC | PRN

## 2022-04-08 MED ORDER — INSULIN GLARGINE-YFGN 100 UNIT/ML ~~LOC~~ SOLN: 50.0000 [IU] | Freq: Every day | SUBCUTANEOUS | 11 refills | Status: AC

## 2022-04-08 MED ORDER — VITAMIN D3 25 MCG PO TABS: 1000.0000 [IU] | ORAL_TABLET | Freq: Every day | ORAL | Status: AC

## 2022-04-08 NOTE — Progress Notes (Signed)
Physical Therapy Treatment Patient Details Name: Thomas Frye MRN: [id number] DOB: [birth year] Today's Date: 04/08/2022   History of Present Illness 67 y.o. male admitted on 04/02/22 for Fall with resultant L hip fx.  Pt s/p L posterior THA.  Per physician's note "no formal hip precautions" and WBAT post op.  Pt with significant PMH of L BKA (was ambulatory with prosthesis PTA--at SNF), DM, DKA, CVAs (per pt multiple).    PT Comments    Pt required mod assist supine to sit, and mod assist sit to stand with RW. Recliner moving even with brakes locked and +2 assist unavailable to stabilize chair. Therefore, stedy utilized for bed to recliner transfer to ensure safety. Pt in recliner at end of session. Plan for mobility tech to assist with back to bed.    Recommendations for follow up therapy are one component of a multi-disciplinary discharge planning process, led by the attending physician.  Recommendations may be updated based on patient status, additional functional criteria and insurance authorization.  Follow Up Recommendations  Skilled nursing-short term rehab (<3 hours/day) Can patient physically be transported by private vehicle: No   Assistance Recommended at Discharge Frequent or constant Supervision/Assistance  Patient can return home with the following Two people to help with walking and/or transfers;Assist for transportation;Help with stairs or ramp for entrance   Equipment Recommendations  Wheelchair (measurements PT);Wheelchair cushion (measurements PT);Hospital bed    Recommendations for Other Services       Precautions / Restrictions Precautions Precautions: Fall;Other (comment) Precaution Comments: h/o falls, L BKA with prosthesis in room. No formal hip prec but posterior approach per sx note. Required Braces or Orthoses: Other Brace Other Brace: L LE prosthesis in room Restrictions LLE Weight Bearing: Weight bearing as tolerated     Mobility  Bed  Mobility Overal bed mobility: Needs Assistance Bed Mobility: Supine to Sit     Supine to sit: Mod assist, HOB elevated     General bed mobility comments: +rail, increased time, cues for sequencing, use of bed pad to scoot to EOB    Transfers Overall transfer level: Needs assistance Equipment used: Rolling walker (2 wheels) Transfers: Sit to/from Stand, Bed to chair/wheelchair/BSC Sit to Stand: Mod assist           General transfer comment: sit to stand with RW x 1 trial. +2 assist unavailable, therefore, stedy retrieved for bed to recliner transfer. Transfer via Lift Equipment: Stedy  Ambulation/Gait                   Stairs             Wheelchair Mobility    Modified Rankin (Stroke Patients Only)       Balance Overall balance assessment: Needs assistance Sitting-balance support: Feet supported, No upper extremity supported Sitting balance-Leahy Scale: Fair     Standing balance support: Bilateral upper extremity supported, Reliant on assistive device for balance Standing balance-Leahy Scale: Poor                              Cognition Arousal/Alertness: Awake/alert Behavior During Therapy: Flat affect Overall Cognitive Status: History of cognitive impairments - at baseline                                          Exercises      General  Comments        Pertinent Vitals/Pain Pain Assessment Pain Assessment: Faces Faces Pain Scale: Hurts little more Pain Location: LLE Pain Descriptors / Indicators: Guarding, Grimacing Pain Intervention(s): Monitored during session, Repositioned, Limited activity within patient's tolerance    Home Living                          Prior Function            PT Goals (current goals can now be found in the care plan section) Acute Rehab PT Goals Patient Stated Goal: to walk Progress towards PT goals: Progressing toward goals    Frequency    Min  3X/week      PT Plan Current plan remains appropriate    Co-evaluation              AM-PAC PT "6 Clicks" Mobility   Outcome Measure  Help needed turning from your back to your side while in a flat bed without using bedrails?: A Lot Help needed moving from lying on your back to sitting on the side of a flat bed without using bedrails?: A Lot Help needed moving to and from a bed to a chair (including a wheelchair)?: A Lot Help needed standing up from a chair using your arms (e.g., wheelchair or bedside chair)?: A Lot Help needed to walk in hospital room?: Total Help needed climbing 3-5 steps with a railing? : Total 6 Click Score: 10    End of Session Equipment Utilized During Treatment: Gait belt Activity Tolerance: Patient tolerated treatment well Patient left: in chair;with call bell/phone within reach Nurse Communication: Mobility status PT Visit Diagnosis: Muscle weakness (generalized) (M62.81);Difficulty in walking, not elsewhere classified (R26.2);Pain Pain - Right/Left: Left Pain - part of body: Hip     Time: 0940-1005 PT Time Calculation (min) (ACUTE ONLY): 25 min  Charges:  $Therapeutic Activity: 23-37 mins                     Lorrin Goodell, PT  Office # [phone] Pager [phone]    Lorriane Shire 04/08/2022, 12:04 PM

## 2022-04-08 NOTE — Progress Notes (Signed)
Patient discharged and transported to [facility] via House. Med necessity and patient AVS summary printed and given.

## 2022-04-08 NOTE — Plan of Care (Signed)

## 2022-04-08 NOTE — Discharge Summary (Addendum)
Name: Thomas Frye MRN: [id number] DOB: April 12, 1955 67 y.o. PCP: Self, Verdene Lennert, PA-C  Date of Admission: 04/02/2022  6:06 AM Date of Discharge: 04/08/2022 Attending Physician: Charise Killian, MD  Discharge Diagnosis: 1. Principal Problem:   Closed displaced fracture of left femoral neck (HCC) Active Problems:   OSA (obstructive sleep apnea)   Type 2 diabetes mellitus with hyperglycemia (HCC)   Hx of BKA, left (HCC)   Major depressive disorder, recurrent severe without psychotic features ([place])   Obesity (BMI 30-39.9)   Urinary retention   History of stroke    Discharge Medications: Allergies as of 04/08/2022       Reactions   Latex Swelling   Sulfa Antibiotics Hives        Medication List     STOP taking these medications    amitriptyline 25 MG tablet Commonly known as: ELAVIL   loperamide 2 MG capsule Commonly known as: IMODIUM   omeprazole 40 MG capsule Commonly known as: PRILOSEC Replaced by: pantoprazole 40 MG tablet   Semglee (yfgn) 100 UNIT/ML Solostar Pen Generic drug: insulin glargine   ticagrelor 90 MG Tabs tablet Commonly known as: BRILINTA       TAKE these medications    acetaminophen 325 MG tablet Commonly known as: TYLENOL Take 650 mg by mouth every 6 (six) hours as needed for moderate pain.   aspirin EC 81 MG tablet Take 1 tablet (81 mg total) by mouth 2 (two) times daily. Take twice a day for 25 more days. Then stop. What changed:  when to take this additional instructions   atorvastatin 10 MG tablet Commonly known as: LIPITOR Take 10 mg by mouth at bedtime.   clopidogrel 75 MG tablet Commonly known as: PLAVIX Take 1 tablet (75 mg total) by mouth daily. Start taking on: April 09, 2022   doxazosin 1 MG tablet Commonly known as: CARDURA Take 1 tablet (1 mg total) by mouth daily. Start taking on: April 09, 2022   ferrous sulfate 325 (65 FE) MG tablet Take 325 mg by mouth daily with breakfast.   FreeStyle Libre 14 Day  Reader Kerrin Mo by Does not apply route.   insulin aspart 100 UNIT/ML injection Commonly known as: novoLOG Inject 10 Units into the skin 3 (three) times daily before meals.   insulin glargine-yfgn 100 UNIT/ML injection Commonly known as: SEMGLEE Inject 0.5 mLs (50 Units total) into the skin at bedtime.   insulin lispro 100 UNIT/ML cartridge Commonly known as: HUMALOG Inject into the skin 3 (three) times daily with meals. Used with insulin pump   lisinopril 20 MG tablet Commonly known as: ZESTRIL Take 20 mg by mouth daily.   Melatonin Maximum Strength 5 MG Tabs Generic drug: melatonin Take 10 mg by mouth at bedtime.   multivitamin with minerals Tabs tablet Take 1 tablet by mouth daily.   Omnipod DASH Pods (Gen 4) Misc USE 1 POD EVERY 2 DAYS AS NEEDED   Pancrelipase (Lip-Prot-Amyl) 4200-14200 units Cpep Take 2 capsules by mouth 3 (three) times daily. 2 capsules  with meals  1 capsule with snacks   pantoprazole 40 MG tablet Commonly known as: PROTONIX Take 1 tablet (40 mg total) by mouth daily. Start taking on: April 09, 2022 Replaces: omeprazole 40 MG capsule   polyethylene glycol 17 g packet Commonly known as: MIRALAX / GLYCOLAX Take 17 g by mouth daily. Start taking on: April 09, 2022   pramipexole 0.125 MG tablet Commonly known as: MIRAPEX Take 0.125 mg by mouth at  bedtime.   QUEtiapine 25 MG tablet Commonly known as: SEROQUEL Take 1 tablet (25 mg total) by mouth at bedtime. What changed:  medication strength how much to take   saccharomyces boulardii 250 MG capsule Commonly known as: FLORASTOR Take 250 mg by mouth daily.   senna-docusate 8.6-50 MG tablet Commonly known as: Senokot-S Take 1 tablet by mouth 2 (two) times daily.   traZODone 100 MG tablet Commonly known as: DESYREL Take 100 mg by mouth at bedtime.   venlafaxine XR 150 MG 24 hr capsule Commonly known as: EFFEXOR-XR Take 150 mg by mouth daily with breakfast.   vitamin D3 25 MCG  tablet Commonly known as: CHOLECALCIFEROL Take 1 tablet (1,000 Units total) by mouth daily. Start taking on: April 09, 2022   vitamin E 1000 UNIT capsule Take 1,000 Units by mouth in the morning and at bedtime.        Disposition and follow-up:   [name] was discharged from [facility] in Good condition.  At the hospital follow up visit please address:  1.  Please ensure pt has made appointments with orthopedics for hip arthroplasty follow up. He has an appointment with urology for evaluation of foley on 1/26 at 9:15 with [facility]. Would recommend attempting voiding trial in the near future if feasible. Please ensure pt is using incentive spirometer. Please ensure patient takes aspirin BID with plavix for 24 more days, and then just plavix. Please attempt to wean patient off venlafaxine. We are also sending him out on 50U of long acting, 10U short acting with meals, please ensure patient has insulin pump equipped at facility.   2.  Labs / imaging needed at time of follow-up: BMP/CBC  3.  Pending labs/ test needing follow-up: NA  Follow-up Appointments:  Follow-up Information     Joen Laura, MD Follow up in 2 week(s).   Specialty: Orthopedic Surgery Contact information: [address] [place] [address] [phone]         Marcine Matar, MD. Go to.   Specialty: Urology Why: January 26th 2024, at 9:45 AM Contact information: [address] [place] [address] [phone]                 Hospital Course by problem list:    #Left femoral neck fracture status post total hip arthroplasty Day 6 Patient initially presented to the emergency department after a fall and left hip pain.  X-ray pelvis of the left hip demonstrated left femoral neck fracture, that required total hip arthroplasty on the left side.  Of note, patient has history of left BKA.  He is currently status post 6 days total hip  arthroplasty.  Initially he was given oxycodone and acetaminophen for pain management however he has not required oxycodone and it was discontinued.  Patient was working with physical therapy with progressive strengthening and able to achieve goals consistently.  Ortho recommended aspirin twice daily for a month for DVT prophylaxis.  After patient's last CVA, he was recommend to take Plavix after taking aspirin and Brilinta for a month, however unsure why that never happened.  Have discontinued his Brilinta, and will continue aspirin twice daily for 24 more days and Plavix.  Will stop aspirin after 24 more days, continue Plavix 75 mg. Recommend consideration of bisphosphonate for further bone protection following vitamin D repletion as below.  #Urinary Retention  After procedure, patient was noted to have decreased urine output.  He stated this  is never happened to him before, however on conversation with the son and looking back at discharge it seems like he required Foley placement and previous hospitalizations.  He has multiple risk factors for urinary retention including BPH, recent surgery, history of CVA, and uncontrolled type 1 diabetes.  He was also taking amitriptyline which we have discontinued.  Bladder scans continue to show elevated urine in the bladder, and patient was catheterized.  He was started on doxazosin 0.5 mg, eventually titrated up to 1mg , and catheter was taken out and underwent voiding trial.  Creatinine has been within normal limits. Patient failed voiding trial x 3, and will be discharged with Foley catheter in place.  He has follow-up with urology for further evaluation on January 26.   #Type 1 diabetes #Diabetic neuropathy #Pancreatic insufficiency Last A1c was 7.7% 4 months ago, current A1c is 8.3%.  Patient has a history of type 1 diabetes mellitus sees an endocrinologist for management.  Home regimen includes Semglee 38 units daily, in addition to an insulin pump for sliding  scale. Fasting glucose was elevated throughout hospital course, and adjustments were made daily to insulin dose. Will be discharging on 50U of long acting insulin, and 10U with mealtime. He was also started on a sliding scale here, however would recommend patient utilize insulin pump again after f/u with endocrinology.   #Acute Hypoxic Respiratory Failure, suspect 2/2 post-operative atelectasis Patient requiring oxygen after surgery, this was new as he does not use oxygen at home.  Likely in the setting of postoperative atelectasis.  On examination on day of discharge, and day prior, patient was resting comfortably with no oxygen requirement.  He has been using incentive spirometer daily.      #Vitamin D Deficiency  Vitamin D level found to be 13.25, started patient on cholecalciferol April 24 998 units.   #Heart failure with preserved ejection fraction #Hypertension Patient's blood pressure remained stable around 110/56.  Hold lisinopril in the setting of AKI.  Last echo in May 2023 showed EF of 60 to 65%, mild LVH and grade 1 diastolic dysfunction.  Currently looks euvolemic on exam.   #Depression Patient's home regimen includes Seroquel 50 mg, trazodone 100 mg, amitriptyline 25 mg, and Effexor 150 mg with breakfast. We have discontinued amitriptyline and decreased seroquel to 25mg  d/t AM drowsiness.   #Anemia of Chronic Disease Anemia of chronic disease revealed on iron studies obtained yesterday.  Likely in the setting of chronic comorbidities.  Patient has never had colonoscopy, denies any bloody stools.   #AKI (resolved) Creatinine was increased from 1.1 to 1.6.  Patient had an episode of hypotension in the OR. Kidney function has returned to baseline.  Discharge Exam:   BP 128/69   Pulse 89   Temp 98 F (36.7 C)   Resp 18   Ht 6\' 3"  (1.905 m)   Wt 115.2 kg   SpO2 91%   BMI 31.75 kg/m  Discharge exam:   Constitutional: Well-appearing male, laying in bed, in no acute  distress Cardiovascular: RRR, no murmurs, rubs or gallops Pulmonary/Chest: normal work of breathing on room air, lungs clear to auscultation bilaterally Abdominal: soft, non-tender, non-distended Skin: warm and dry Extremities: L BKA,  Aquacel intact on left extremity  Pertinent Labs, Studies, and Procedures:     Latest Ref Rng & Units 04/08/2022    4:14 AM 04/07/2022    3:42 AM 04/06/2022    3:13 AM  BMP  Glucose 70 - 99 mg/dL 06/07/2022  06/06/2022  06/05/2022  BUN 8 - 23 mg/dL 18  20  24    Creatinine 0.61 - 1.24 mg/dL  5.62  1.30   Sodium 135 - 145 mmol/L 135  136  133   Potassium 3.5 - 5.1 mmol/L 3.7  3.5  3.9   Chloride 98 - 111 mmol/L 99  101  99   CO2 22 - 32 mmol/L 28  26  25    Calcium 8.9 - 10.3 mg/dL 8.8  8.7  8.6        Latest Ref Rng & Units 04/08/2022    4:14 AM 04/07/2022    3:42 AM 04/06/2022    3:13 AM  CBC  WBC 4.0 - 10.5 K/uL 7.0  6.4  7.1   Hemoglobin 13.0 - 17.0 g/dL 8.2  8.2  8.1   Hematocrit 39.0 - 52.0 % 24.1  24.2  25.1   Platelets 150 - 400 K/uL 282  245  232     DG HIP UNILAT W OR W/O PELVIS 2-3 VIEWS LEFT  Result Date: 04/02/2022 CLINICAL DATA:  Postop left hip EXAM: DG HIP (WITH OR WITHOUT PELVIS) 2-3V LEFT COMPARISON:  None Available. FINDINGS: The patient is status post left hip replacement. Hardware is in good position on provided views. IMPRESSION: Left hip replacement as above. Electronically Signed   By: 06/05/2022 III M.D.   On: 04/02/2022 16:40   DG Pelvis 1-2 Views  Result Date: 04/02/2022 CLINICAL DATA:  Intraoperative study.  Elective surgery. EXAM: PELVIS - 1-2 VIEW COMPARISON:  None Available. FINDINGS: Two images were obtained during left hip replacement. Acetabular and femoral components are in good position. IMPRESSION: Two images were obtained during left hip replacement as above. Electronically Signed   By: 06/01/2022 III M.D.   On: 04/02/2022 15:24   DG Chest Port 1 View  Result Date: 04/02/2022 CLINICAL DATA:  Fall.  Pain. EXAM: PORTABLE  CHEST 1 VIEW COMPARISON:  08/09/2021 FINDINGS: 0652 hours. Low volume film. The cardio pericardial silhouette is enlarged. Diffuse interstitial opacity to suggest edema. No focal lung consolidation. No substantial pleural effusion. Telemetry leads overlie the chest. IMPRESSION: Low volume film with diffuse interstitial opacity suggesting edema. Electronically Signed   By: 04-23-1968 M.D.   On: 04/02/2022 07:22   DG Pelvis 1-2 Views  Result Date: 04/02/2022 CLINICAL DATA:  Fall with pain EXAM: PELVIS - 2 VIEW COMPARISON:  None Available. FINDINGS: Displaced left femoral neck fracture, transcervical to subcapital. The bony pelvis is intact. No notable acetabular spurring. Osteopenia. Overlapping metallic artifact. IMPRESSION: Displaced left femoral neck fracture. Electronically Signed   By: 06/01/2022 M.D.   On: 04/02/2022 07:21   DG FEMUR MIN 2 VIEWS LEFT  Result Date: 04/02/2022 CLINICAL DATA:  Fall with pain EXAM: LEFT FEMUR 2 VIEWS COMPARISON:  None Available. FINDINGS: Displaced left femoral neck fracture. Subjective osteopenia. Extensive atheromatous calcification. IMPRESSION: Displaced femoral neck fracture. Electronically Signed   By: 06/01/2022 M.D.   On: 04/02/2022 07:20     Discharge Instructions: Discharge Instructions     Call MD for:  persistant nausea and vomiting   Complete by: As directed    Call MD for:  redness, tenderness, or signs of infection (pain, swelling, redness, odor or green/yellow discharge around incision site)   Complete by: As directed    Call MD for:  severe uncontrolled pain   Complete by: As directed    Call MD for:  temperature >100.4   Complete by: As directed  Diet - low sodium heart healthy   Complete by: As directed    Increase activity slowly   Complete by: As directed    Insert,temp indwelling blad cath,simple   Complete by: As directed        Signed: Drucie Opitz, MD 04/08/2022, 11:56 AM   Pager: [phone]

## 2022-04-08 NOTE — Progress Notes (Signed)
Report called and given to Beverely Low, RN at [facility]. Patient awaiting transport via [facility].

## 2022-04-08 NOTE — Progress Notes (Signed)
     Subjective: Patient reports pain as mild.  Up to recliner this afternoon.  Helped back to bed. Mobility has been slow going due to fatigue. Sugars have been elevated.   Objective:   VITALS:   Vitals:   04/08/22 0428 04/08/22 0809 04/08/22 0841 04/08/22 1100  BP: 130/70 124/65 128/69 131/71  Pulse: 89 89  88  Resp: 16 18    Temp: 98.9 F (37.2 C) 98 F (36.7 C)  97.9 F (36.6 C)  TempSrc: Oral     SpO2: 95% 91%  94%  Weight:      Height:        Incision: dressing C/D/I Compartment soft   Lab Results  Component Value Date   WBC 7.0 04/08/2022   HGB 8.2 (L) 04/08/2022   HCT 24.1 (L) 04/08/2022   MCV 96.8 04/08/2022   PLT 282 04/08/2022   BMET    Component Value Date/Time   NA 135 04/08/2022 0414   K 3.7 04/08/2022 0414   CL 99 04/08/2022 0414   CO2 28 04/08/2022 0414   GLUCOSE 206 (H) 04/08/2022 0414   BUN 18 04/08/2022 0414   CREATININE 0.95 04/08/2022 0414   CALCIUM 8.8 (L) 04/08/2022 0414   GFRNONAA >60 04/08/2022 0414      Xray: THA components good position no adverse features  Assessment/Plan: 6 Days Post-Op   Principal Problem:   Closed displaced fracture of left femoral neck (HCC) Active Problems:   OSA (obstructive sleep apnea)   Type 2 diabetes mellitus with hyperglycemia (HCC)   Hx of BKA, left (HCC)   Major depressive disorder, recurrent severe without psychotic features ([place])   Obesity (BMI 30-39.9)   Urinary retention   History of stroke  S/p L THA for FN fx 04/02/22   Post op recs: WB: WBAT LLE, No formal hip precautions Abx: ancef Imaging: PACU pelvis Xray Dressing: Aquacell, keep intact until follow up DVT prophylaxis: Aspirin 81BID starting POD1 also resumed palvix 75mg . Follow up: 2 weeks after surgery for a wound check with Dr. Zachery Dakins at [facility].  Address: [address] [place], [place], [place] [address]  Office Phone: [phone]      Willaim Sheng 04/08/2022, 1:59 PM   Charlies Constable, MD  Contact information:   [phone] 7am-5pm epic message Dr. Zachery Dakins, or call office for patient follow up: [phone] After hours and holidays please check [email or web address] for group call information for [facility]

## 2022-04-08 NOTE — Progress Notes (Signed)
Mobility Specialist Progress Note   04/08/22 1224  Mobility  Activity Transferred from chair to bed  Level of Assistance Moderate assist, patient does 50-74%  Assistive Device Front wheel walker  Distance Ambulated (ft) 4 ft  LLE Weight Bearing WBAT  Activity Response Tolerated well  Mobility Referral Yes  $Mobility charge 1 Mobility   Received pt in chair requesting get back to bed. ModA throughout transfer, pt generally weak and fatigues quickly. Knees stay in this semi flexed position d/t BLE weakness. Able to stand and pivot to EOB w/ directional cues, no faults. MinA to doff prosthesis but minG BTB w/ increased time. Left w/ call bell in reach and bed alarm on.  Holland Falling Mobility Specialist Please contact via SecureChat or  Rehab office at [phone]

## 2022-04-08 NOTE — Plan of Care (Signed)
Problem: Education: Goal: Ability to describe self-care measures that may prevent or decrease complications (Diabetes Survival Skills Education) will improve 04/08/2022 1356 by Kieth Brightly, LPN Outcome: Adequate for Discharge 04/08/2022 1355 by Kieth Brightly, LPN Outcome: Progressing Goal: Individualized Educational Video(s) 04/08/2022 1356 by Kieth Brightly, LPN Outcome: Adequate for Discharge 04/08/2022 1355 by Kieth Brightly, LPN Outcome: Progressing   Problem: Coping: Goal: Ability to adjust to condition or change in health will improve 04/08/2022 1356 by Kieth Brightly, LPN Outcome: Adequate for Discharge 04/08/2022 1355 by Kieth Brightly, LPN Outcome: Progressing   Problem: Fluid Volume: Goal: Ability to maintain a balanced intake and output will improve 04/08/2022 1356 by Kieth Brightly, LPN Outcome: Adequate for Discharge 04/08/2022 1355 by Kieth Brightly, LPN Outcome: Progressing   Problem: Health Behavior/Discharge Planning: Goal: Ability to identify and utilize available resources and services will improve 04/08/2022 1356 by Kieth Brightly, LPN Outcome: Adequate for Discharge 04/08/2022 1355 by Kieth Brightly, LPN Outcome: Progressing Goal: Ability to manage health-related needs will improve 04/08/2022 1356 by Kieth Brightly, LPN Outcome: Adequate for Discharge 04/08/2022 1355 by Kieth Brightly, LPN Outcome: Progressing   Problem: Metabolic: Goal: Ability to maintain appropriate glucose levels will improve 04/08/2022 1356 by Kieth Brightly, LPN Outcome: Adequate for Discharge 04/08/2022 1355 by Kieth Brightly, LPN Outcome: Progressing   Problem: Nutritional: Goal: Maintenance of adequate nutrition will improve 04/08/2022 1356 by Kieth Brightly, LPN Outcome: Adequate for Discharge 04/08/2022 1355 by Kieth Brightly, LPN Outcome: Progressing Goal: Progress toward achieving an optimal  weight will improve 04/08/2022 1356 by Kieth Brightly, LPN Outcome: Adequate for Discharge 04/08/2022 1355 by Kieth Brightly, LPN Outcome: Progressing   Problem: Skin Integrity: Goal: Risk for impaired skin integrity will decrease 04/08/2022 1356 by Kieth Brightly, LPN Outcome: Adequate for Discharge 04/08/2022 1355 by Kieth Brightly, LPN Outcome: Progressing   Problem: Tissue Perfusion: Goal: Adequacy of tissue perfusion will improve 04/08/2022 1356 by Kieth Brightly, LPN Outcome: Adequate for Discharge 04/08/2022 1355 by Kieth Brightly, LPN Outcome: Progressing   Problem: Education: Goal: Knowledge of General Education information will improve Description: Including pain rating scale, medication(s)/side effects and non-pharmacologic comfort measures 04/08/2022 1356 by Kieth Brightly, LPN Outcome: Adequate for Discharge 04/08/2022 1355 by Kieth Brightly, LPN Outcome: Progressing   Problem: Health Behavior/Discharge Planning: Goal: Ability to manage health-related needs will improve 04/08/2022 1356 by Kieth Brightly, LPN Outcome: Adequate for Discharge 04/08/2022 1355 by Kieth Brightly, LPN Outcome: Progressing   Problem: Clinical Measurements: Goal: Ability to maintain clinical measurements within normal limits will improve 04/08/2022 1356 by Kieth Brightly, LPN Outcome: Adequate for Discharge 04/08/2022 1355 by Kieth Brightly, LPN Outcome: Progressing Goal: Will remain free from infection 04/08/2022 1356 by Kieth Brightly, LPN Outcome: Adequate for Discharge 04/08/2022 1355 by Kieth Brightly, LPN Outcome: Progressing Goal: Diagnostic test results will improve 04/08/2022 1356 by Kieth Brightly, LPN Outcome: Adequate for Discharge 04/08/2022 1355 by Kieth Brightly, LPN Outcome: Progressing Goal: Respiratory complications will improve 04/08/2022 1356 by Kieth Brightly, LPN Outcome: Adequate for  Discharge 04/08/2022 1355 by Kieth Brightly, LPN Outcome: Progressing Goal: Cardiovascular complication will be avoided 04/08/2022 1356 by Kieth Brightly, LPN Outcome: Adequate for Discharge 04/08/2022 1355 by Kieth Brightly, LPN Outcome: Progressing   Problem: Activity: Goal: Risk for activity intolerance will decrease 04/08/2022 1356 by Kieth Brightly, LPN Outcome: Adequate for Discharge  04/08/2022 1355 by Kieth Brightly, LPN Outcome: Progressing   Problem: Nutrition: Goal: Adequate nutrition will be maintained 04/08/2022 1356 by Kieth Brightly, LPN Outcome: Adequate for Discharge 04/08/2022 1355 by Kieth Brightly, LPN Outcome: Progressing   Problem: Coping: Goal: Level of anxiety will decrease 04/08/2022 1356 by Kieth Brightly, LPN Outcome: Adequate for Discharge 04/08/2022 1355 by Kieth Brightly, LPN Outcome: Progressing   Problem: Elimination: Goal: Will not experience complications related to bowel motility 04/08/2022 1356 by Kieth Brightly, LPN Outcome: Adequate for Discharge 04/08/2022 1355 by Kieth Brightly, LPN Outcome: Progressing Goal: Will not experience complications related to urinary retention 04/08/2022 1356 by Kieth Brightly, LPN Outcome: Adequate for Discharge 04/08/2022 1355 by Kieth Brightly, LPN Outcome: Progressing   Problem: Pain Managment: Goal: General experience of comfort will improve 04/08/2022 1356 by Kieth Brightly, LPN Outcome: Adequate for Discharge 04/08/2022 1355 by Kieth Brightly, LPN Outcome: Progressing   Problem: Safety: Goal: Ability to remain free from injury will improve 04/08/2022 1356 by Kieth Brightly, LPN Outcome: Adequate for Discharge 04/08/2022 1355 by Kieth Brightly, LPN Outcome: Progressing   Problem: Skin Integrity: Goal: Risk for impaired skin integrity will decrease 04/08/2022 1356 by Kieth Brightly, LPN Outcome: Adequate for  Discharge 04/08/2022 1355 by Kieth Brightly, LPN Outcome: Progressing

## 2022-04-08 NOTE — TOC Transition Note (Signed)
Transition of Care [facility]) - CM/SW Discharge Note   Patient Details  Name: Thomas Frye MRN: [id number] Date of Birth: 30-Aug-1955  [facility] CM/SW Contact:  Amador Cunas, [place] Phone Number: 04/08/2022, 12:49 PM   Clinical Narrative:  Pt for dc to [facility] today. Spoke to [facility] in admissions who confirmed they are prepared to admit pt to room 604B. Pt's son Thomas Frye aware of dc and reports agreeable. RN provided with number for report and [facility] arranged for transport. SW signing off at dc.   Wandra Feinstein, MSW, LCSW [phone] (coverage)       Final next level of care: Skilled Nursing Facility Barriers to Discharge: No Barriers Identified   Patient Goals and CMS Choice CMS Medicare.gov Compare Post Acute Care list provided to:: Patient Represenative (must comment) (son, Thomas Frye) Choice offered to / list presented to : Adult Children  Discharge Placement                Patient chooses bed at: [facility] Patient to be transferred to facility by: [place] Name of family member notified: Thomas Frye/son Patient and family notified of of transfer: 04/08/22  Discharge Plan and Services Additional resources added to the After Visit Summary for   In-house Referral: Clinical Social Work Discharge Planning Services: CM Consult Post Acute Care Choice: [facility]          DME Arranged: N/A DME Agency: NA       HH Arranged: NA [place] Agency: NA        Social Determinants of Health ([place]) Interventions SDOH Screenings   Tobacco Use: Low Risk  (04/04/2022)     Readmission Risk Interventions    08/11/2021    1:04 PM  Readmission Risk Prevention Plan  Transportation Screening Complete  PCP or Specialist Appt within 5-7 Days Complete  Home Care Screening Complete  Medication Review (RN CM) Complete

## 2024-01-03 IMAGING — MR MR [PERSON_NAME] LOW WO/W CM*L*
4 of 9 series · 14 of 40 positions shown · IV contrast (gadavist)
Comparison: None.

CLINICAL DATA: Open wound along the amputation site.

EXAM:
MRI OF LOWER LEFT EXTREMITY WITHOUT AND WITH CONTRAST
TECHNIQUE: Multiplanar, multisequence MR imaging of the left lower extremity
was performed both before and after administration of intravenous
contrast.
CONTRAST:  10mL GADAVIST GADOBUTROL 1 MMOL/ML IV SOLN

[Series 4: T1 · coronal · 5.0mm · 0.43mm/px · 2 of 26 slices shown (1 of 3)]
[im 1/26]
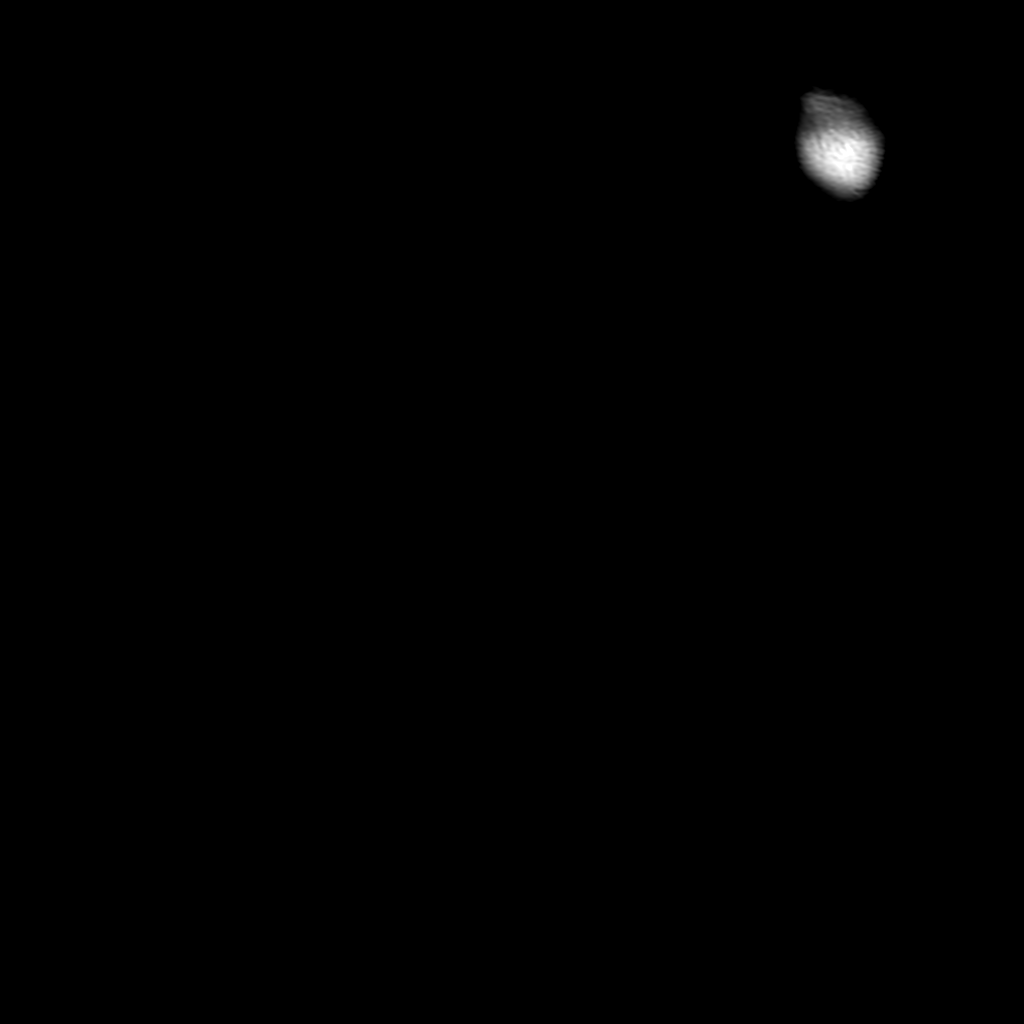
[im 26/26]
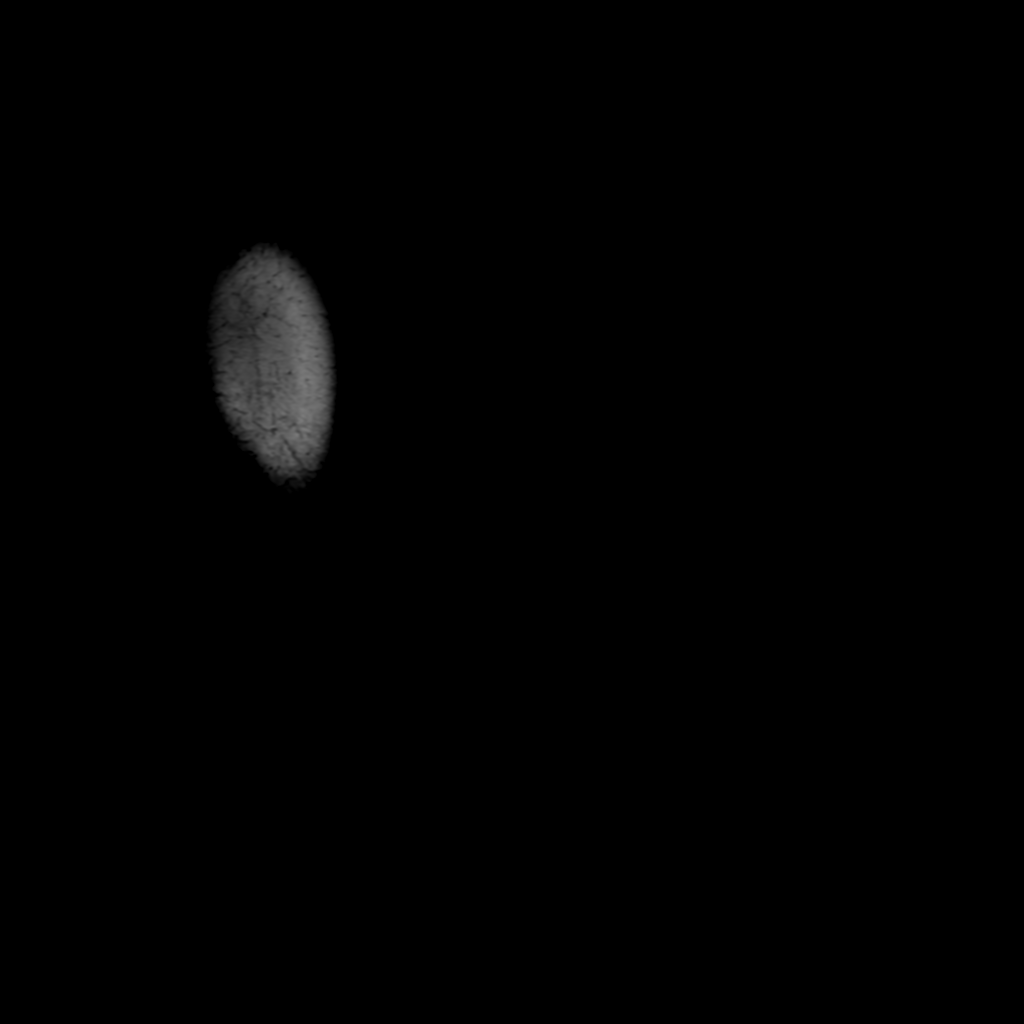

[Series 5: T1 · axial · 4.0mm · 0.51mm/px · z∈[-66,+153]mm · 3 of 69 slices shown (2 of 3)]
[im 12/69]
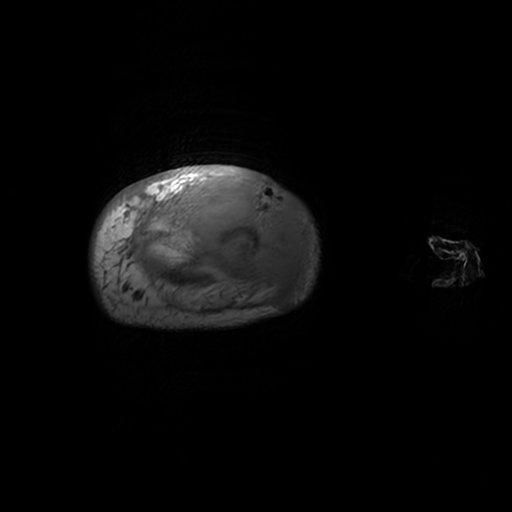
[im 35/69]
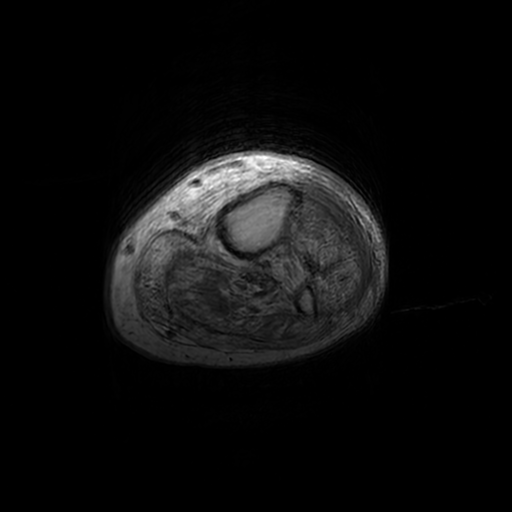
[im 57/69]
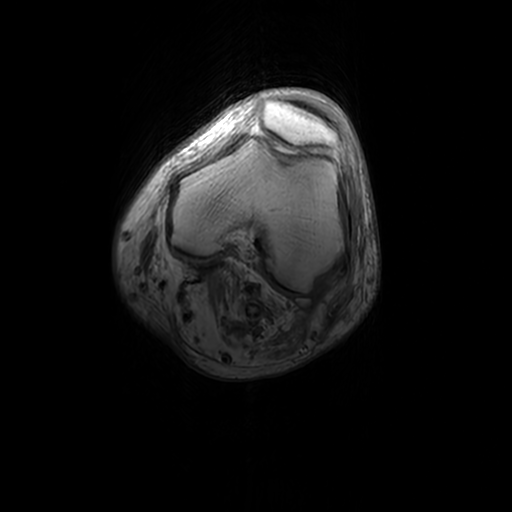

[Series 6: T2 fat-sat · axial · 4.0mm · 0.51mm/px · z∈[-120,+153]mm · 6 of 69 slices shown]
[im 1/69]
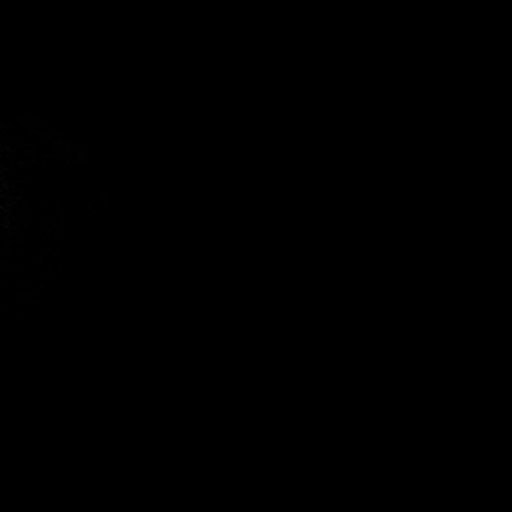
[im 12/69]
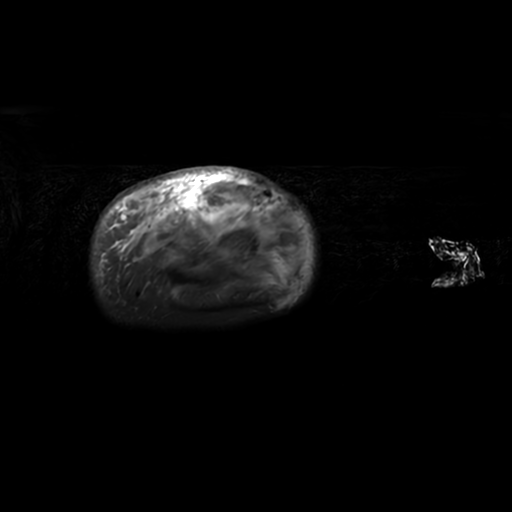
[im 23/69]
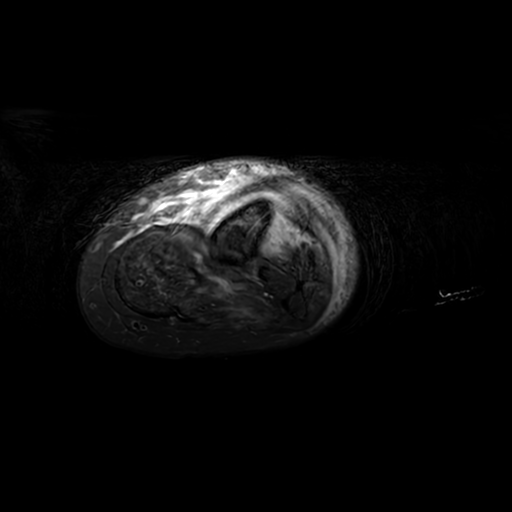
[im 35/69]
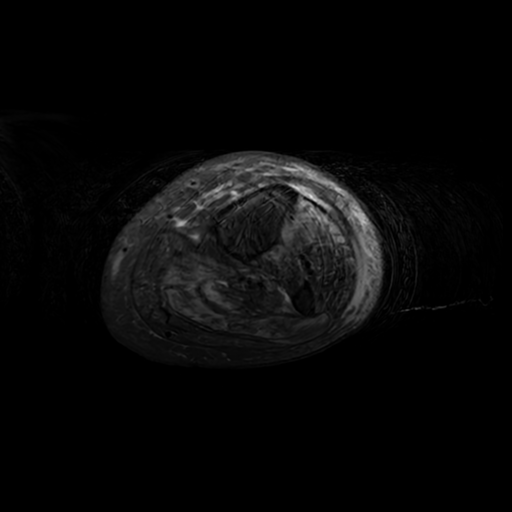
[im 46/69]
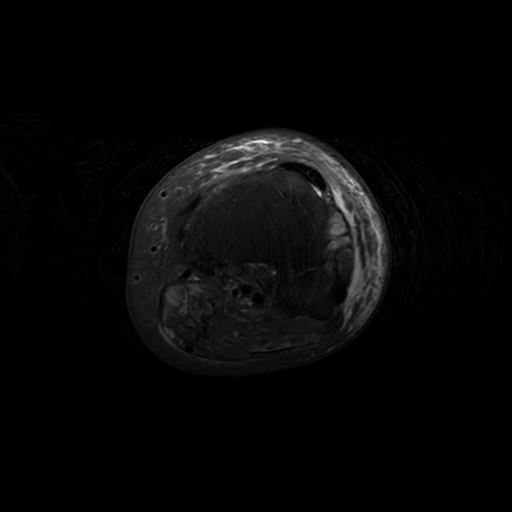
[im 57/69]
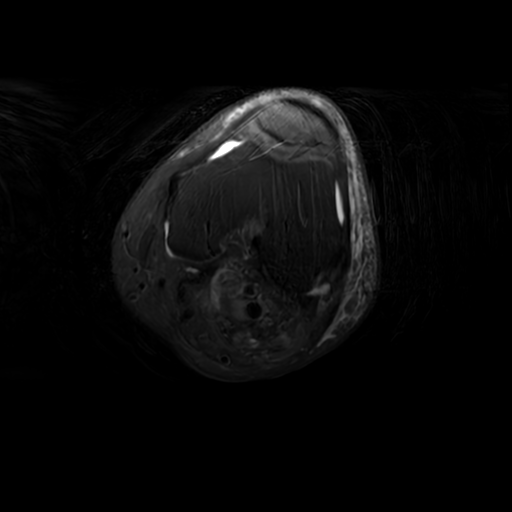

[Series 8: T1 · axial · non-contrast · 4.0mm · 0.51mm/px · z∈[-66,+153]mm · 3 of 69 slices shown (3 of 3)]
[im 12/69]
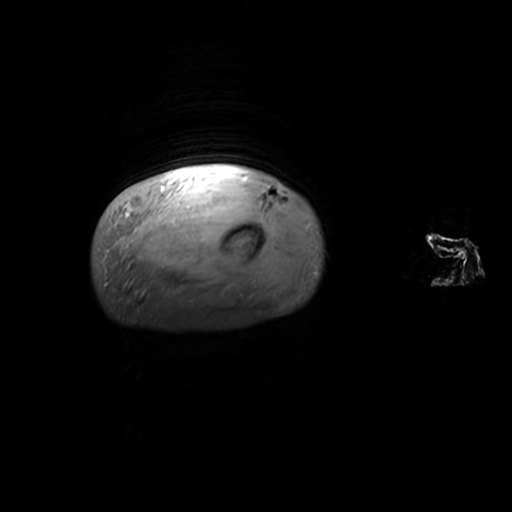
[im 35/69]
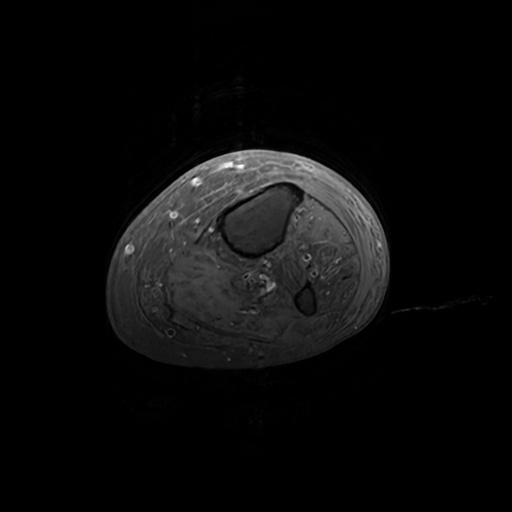
[im 57/69]
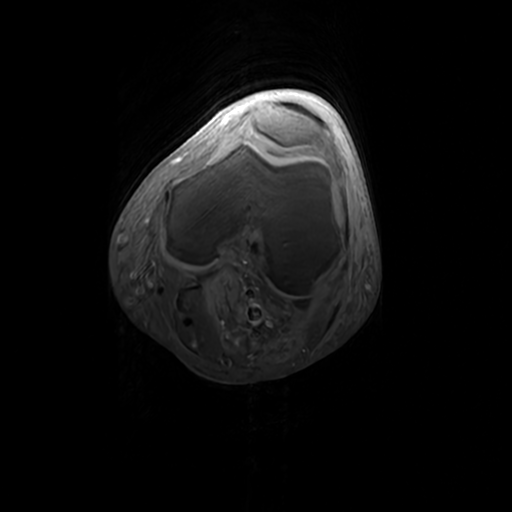

[14 of 40 positions shown; findings below may reference images not displayed]

FINDINGS: Marked subcutaneous soft tissue swelling/edema involving the left
lower extremity diffusely but most significantly distally around the
amputation site. Findings consistent with cellulitis. There is also
surrounding myofasciitis. I do not see a discrete rim enhancing
fluid collection to suggest a drainable soft tissue abscess. There
is an open wound which contains gas.

Abnormal T1 and T2 signal intensity and enhancement in the tibia
near the amputation site consistent with osteomyelitis.

I do not see any findings suspicious for septic arthritis at the
knee joint. The fibula is intact.
IMPRESSION: 1. Open wound involving the distal aspect of the left lower
extremity anteriorly near the amputation site. Associated cellulitis
and myofasciitis. No discrete rim enhancing abscess is identified.
2. Osteomyelitis involving the tibia near the amputation site.
3. No findings for septic arthritis at the knee joint.
